# Patient Record
Sex: Male | Born: 1955 | Race: Black or African American | Hispanic: No | Marital: Married | Smoking: Former smoker
Health system: Southern US, Community
[De-identification: ages and names within clinical notes are randomized; demographics above are authoritative.]

## PROBLEM LIST (undated history)

## (undated) DIAGNOSIS — N186 End stage renal disease: Secondary | ICD-10-CM

## (undated) DIAGNOSIS — D649 Anemia, unspecified: Secondary | ICD-10-CM

## (undated) DIAGNOSIS — I6789 Other cerebrovascular disease: Secondary | ICD-10-CM

## (undated) DIAGNOSIS — I509 Heart failure, unspecified: Secondary | ICD-10-CM

## (undated) DIAGNOSIS — M1990 Unspecified osteoarthritis, unspecified site: Secondary | ICD-10-CM

## (undated) DIAGNOSIS — D631 Anemia in chronic kidney disease: Secondary | ICD-10-CM

## (undated) DIAGNOSIS — I5020 Unspecified systolic (congestive) heart failure: Secondary | ICD-10-CM

## (undated) DIAGNOSIS — E119 Type 2 diabetes mellitus without complications: Secondary | ICD-10-CM

## (undated) DIAGNOSIS — N189 Chronic kidney disease, unspecified: Secondary | ICD-10-CM

## (undated) DIAGNOSIS — I480 Paroxysmal atrial fibrillation: Secondary | ICD-10-CM

## (undated) DIAGNOSIS — Z992 Dependence on renal dialysis: Secondary | ICD-10-CM

## (undated) DIAGNOSIS — I639 Cerebral infarction, unspecified: Secondary | ICD-10-CM

## (undated) DIAGNOSIS — G4700 Insomnia, unspecified: Secondary | ICD-10-CM

## (undated) DIAGNOSIS — I10 Essential (primary) hypertension: Secondary | ICD-10-CM

## (undated) DIAGNOSIS — I5022 Chronic systolic (congestive) heart failure: Secondary | ICD-10-CM

## (undated) DIAGNOSIS — R112 Nausea with vomiting, unspecified: Secondary | ICD-10-CM

## (undated) DIAGNOSIS — Z9889 Other specified postprocedural states: Secondary | ICD-10-CM

## (undated) DIAGNOSIS — R0600 Dyspnea, unspecified: Secondary | ICD-10-CM

## (undated) DIAGNOSIS — R0609 Other forms of dyspnea: Secondary | ICD-10-CM

## (undated) DIAGNOSIS — I340 Nonrheumatic mitral (valve) insufficiency: Secondary | ICD-10-CM

## (undated) DIAGNOSIS — I5030 Unspecified diastolic (congestive) heart failure: Secondary | ICD-10-CM

## (undated) DIAGNOSIS — I2510 Atherosclerotic heart disease of native coronary artery without angina pectoris: Secondary | ICD-10-CM

## (undated) DIAGNOSIS — R011 Cardiac murmur, unspecified: Secondary | ICD-10-CM

## (undated) DIAGNOSIS — Z7982 Long term (current) use of aspirin: Secondary | ICD-10-CM

## (undated) DIAGNOSIS — E785 Hyperlipidemia, unspecified: Secondary | ICD-10-CM

## (undated) DIAGNOSIS — I4729 Other ventricular tachycardia: Secondary | ICD-10-CM

## (undated) DIAGNOSIS — F32A Depression, unspecified: Secondary | ICD-10-CM

## (undated) DIAGNOSIS — I272 Pulmonary hypertension, unspecified: Secondary | ICD-10-CM

## (undated) DIAGNOSIS — I517 Cardiomegaly: Secondary | ICD-10-CM

## (undated) DIAGNOSIS — I619 Nontraumatic intracerebral hemorrhage, unspecified: Secondary | ICD-10-CM

## (undated) HISTORY — DX: Nonrheumatic mitral (valve) insufficiency: I34.0

## (undated) HISTORY — DX: Unspecified systolic (congestive) heart failure: I50.20

## (undated) HISTORY — DX: Heart failure, unspecified: I50.9

## (undated) HISTORY — DX: Essential (primary) hypertension: I10

## (undated) HISTORY — PX: COLONOSCOPY: SHX174

## (undated) HISTORY — DX: Chronic kidney disease, unspecified: N18.9

## (undated) HISTORY — DX: Type 2 diabetes mellitus without complications: E11.9

## (undated) HISTORY — DX: Hyperlipidemia, unspecified: E78.5

## (undated) HISTORY — DX: Chronic systolic (congestive) heart failure: I50.22

---

## 2006-08-09 ENCOUNTER — Ambulatory Visit: Payer: Self-pay | Admitting: Cardiology

## 2014-06-01 IMAGING — CR CHEST 2 VWS PA LAT
1 series · 2 of 2 positions shown · non-contrast
Comparison: None

HISTORY/INDICATIONS: Cough, rule out pneumonia
TECHNIQUE: Chest 2 views.

[Series 1: view not recorded · 0.17mm/px · 2 of 2 slices shown]
[im 1/2]
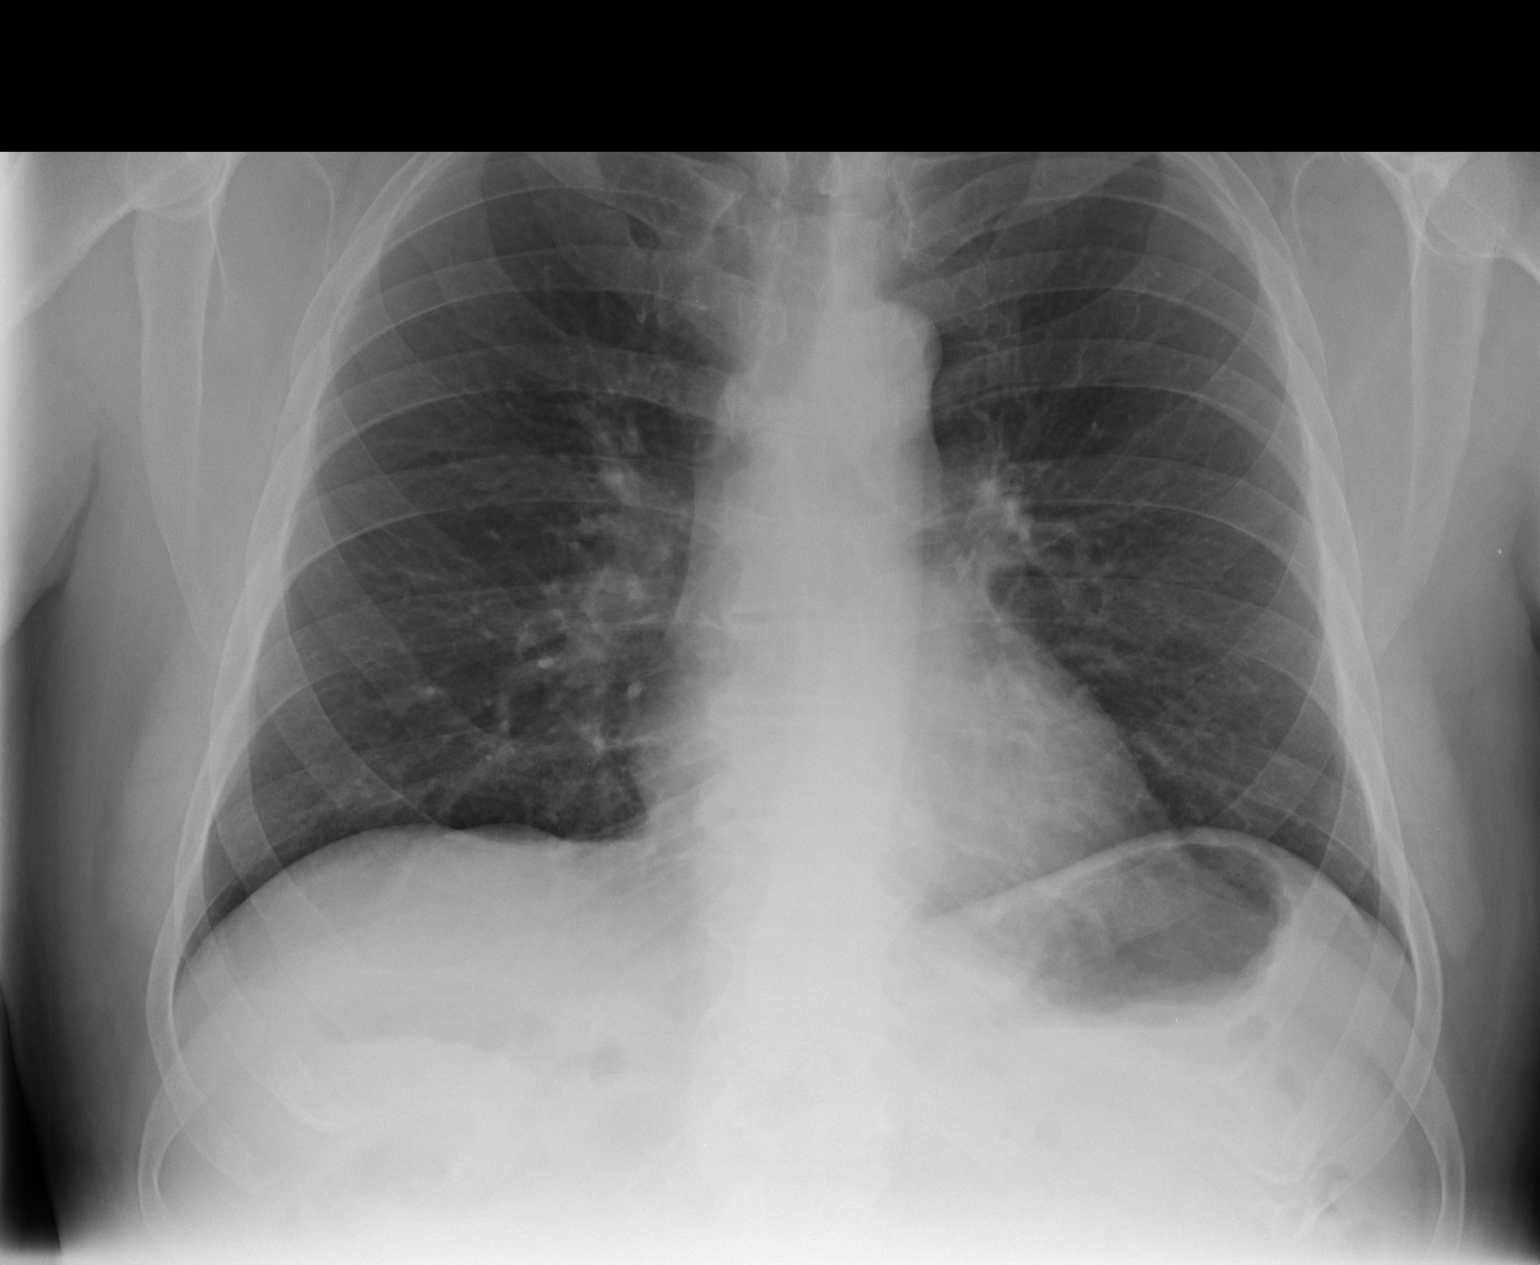
[im 2/2]
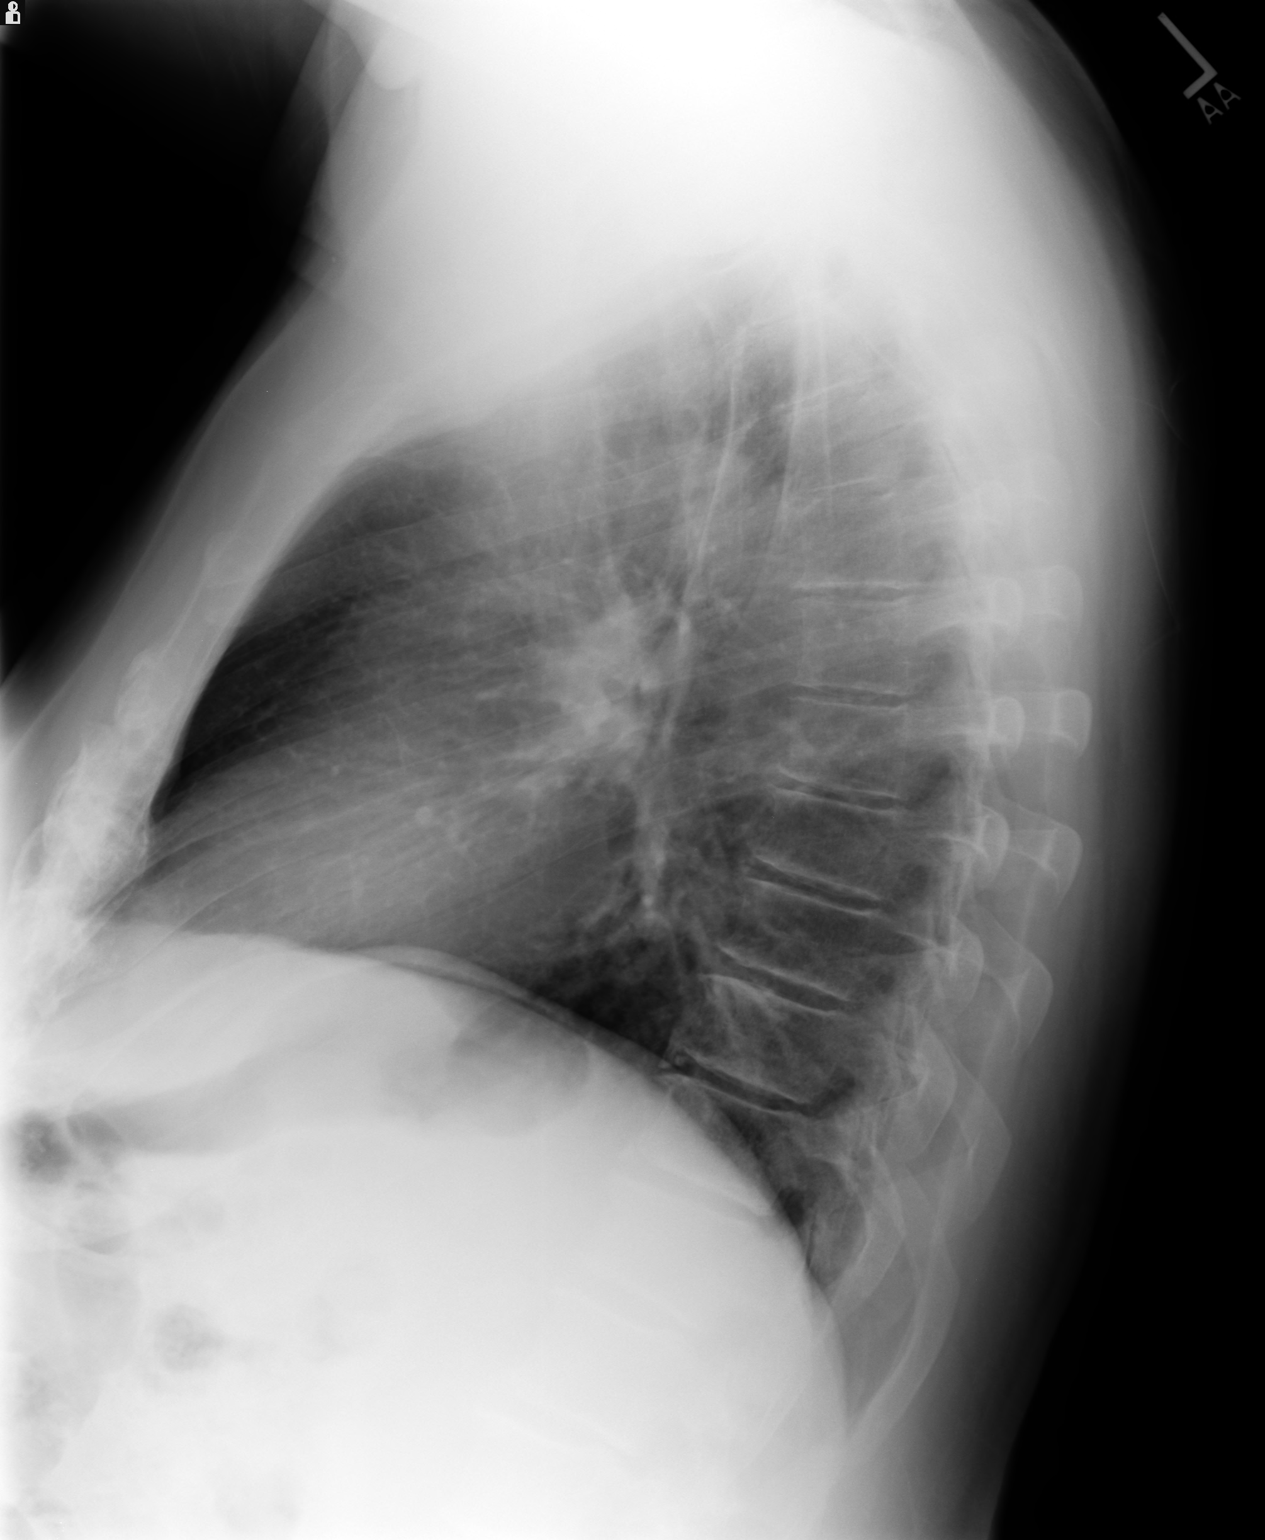

[2 of 2 positions shown; findings below may reference images not displayed]

FINDINGS: Cardiac size and pulmonary vascularity are normal.  The mediastinal silhouette is unremarkable.  The lungs are normal, and there is no pleural fluid.
IMPRESSION: Normal chest.

## 2016-03-10 IMAGING — CR WRIST LT 3 VWS MIN
2 series · 4 of 4 positions shown · non-contrast
Comparison: None.

HISTORY: 60 years-old Male with Encounter for general adult medical examination without abnormal findings.
TECHNIQUE: 4 views of left wrist.

[Series 1: oblique · 0.17mm/px · 3 of 3 slices shown]
[im 1/3]
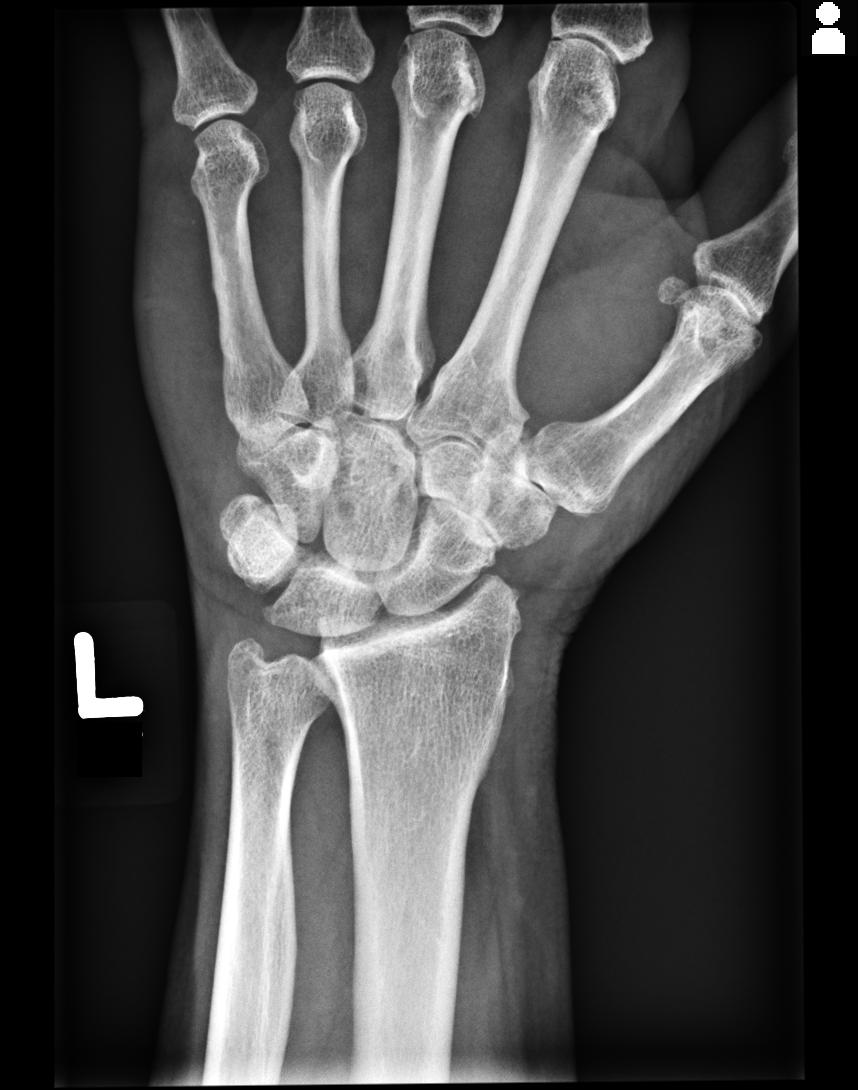
[im 2/3]
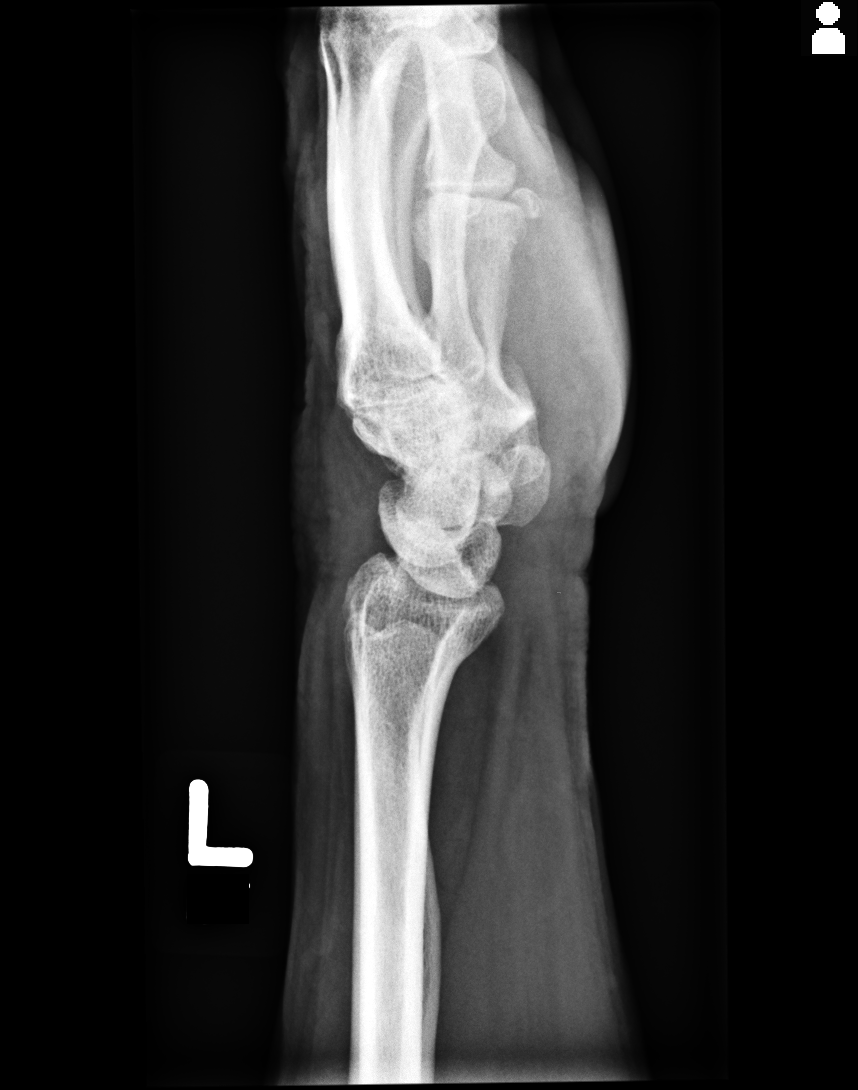
[im 3/3]
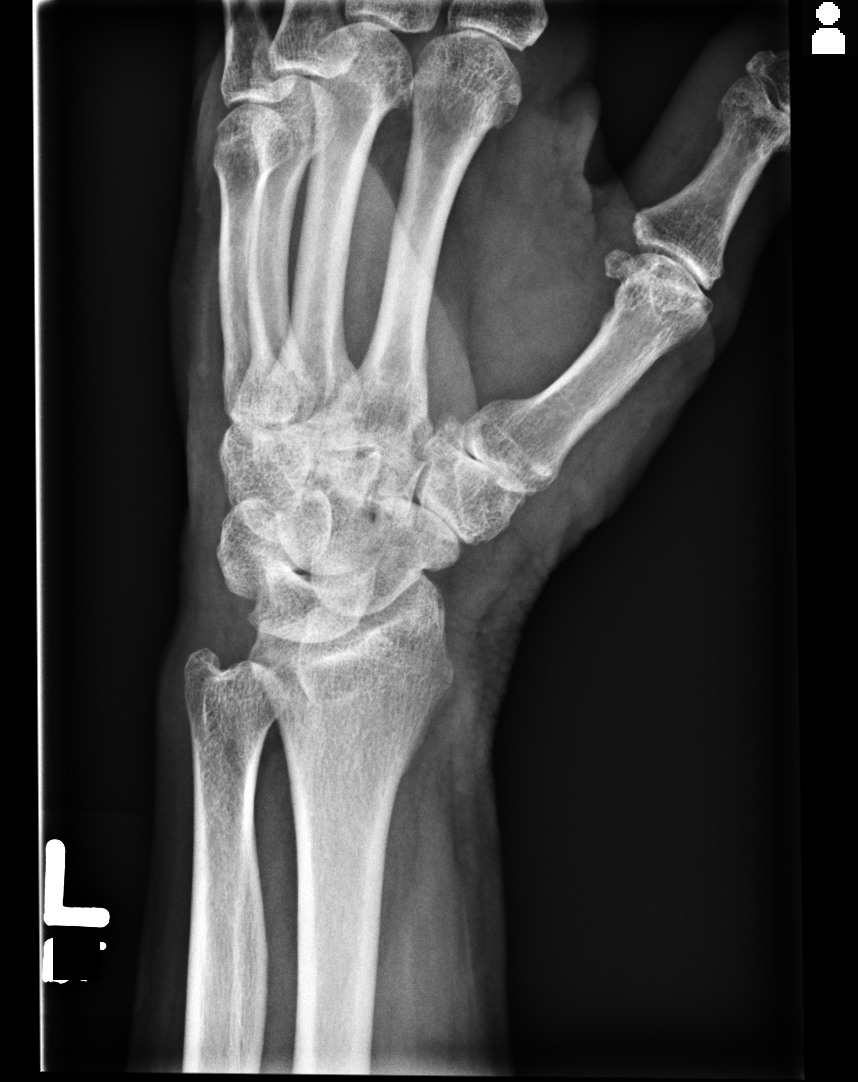

[ulnar flexion]
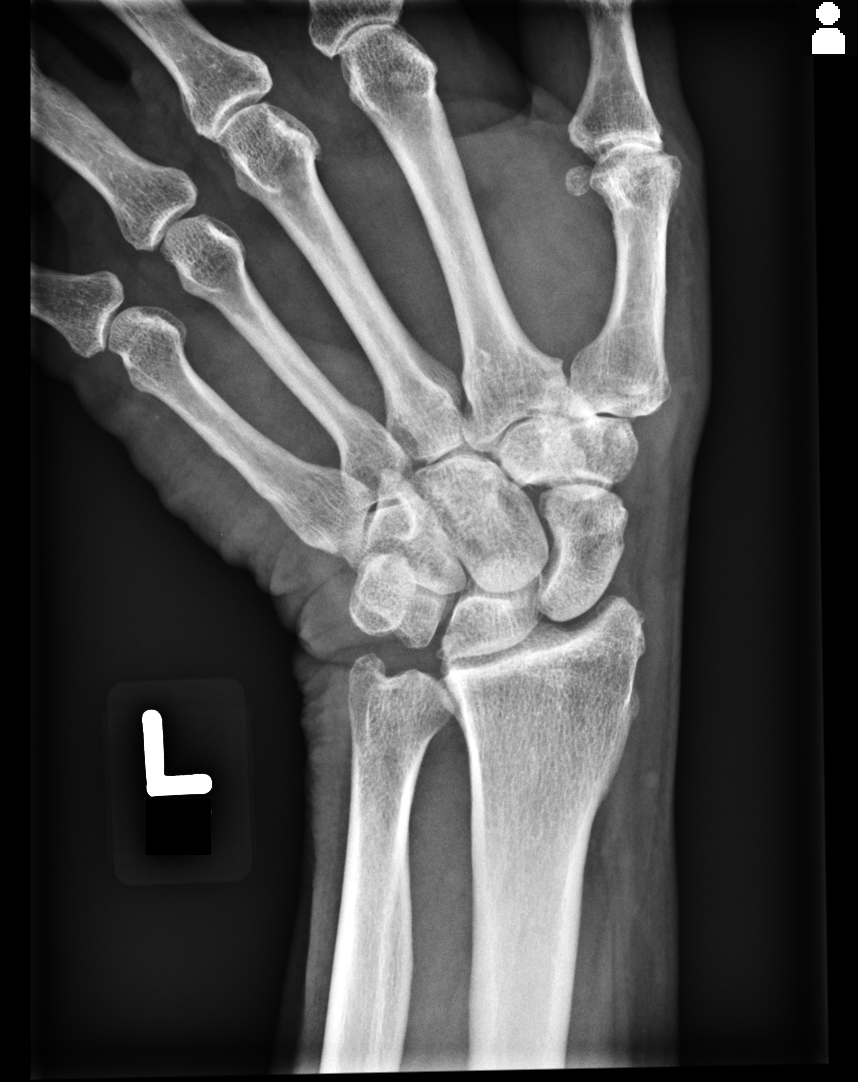

[4 of 4 positions shown; findings below may reference images not displayed]

FINDINGS: Bones: There is no evidence of fracture. Small cysts are present in the capitate bone.

Joints: The osseous alignment is unremarkable. No significant joint effusion is present.

Degenerative joint changes: There are no significant degenerative changes.

Soft tissues: The soft tissues are unremarkable. No radiopaque foreign bodies are present.
IMPRESSION: 1. No acute findings.

2. Small cysts identified in the capitate bone.

3. Consider MRI evaluation if patient has persistent pain.

## 2016-04-25 ENCOUNTER — Encounter: Payer: Self-pay | Admitting: *Deleted

## 2016-05-02 ENCOUNTER — Encounter: Payer: Self-pay | Admitting: General Surgery

## 2016-05-02 ENCOUNTER — Ambulatory Visit (INDEPENDENT_AMBULATORY_CARE_PROVIDER_SITE_OTHER): Payer: Managed Care, Other (non HMO) | Admitting: General Surgery

## 2016-05-02 VITALS — BP 134/74 | HR 76 | Resp 12 | Ht 70.0 in | Wt 232.0 lb

## 2016-05-02 DIAGNOSIS — Z1211 Encounter for screening for malignant neoplasm of colon: Secondary | ICD-10-CM

## 2016-05-02 MED ORDER — POLYETHYLENE GLYCOL 3350 17 GM/SCOOP PO POWD: 1.0000 | Freq: Once | ORAL | 0 refills | Status: AC

## 2016-05-02 NOTE — Progress Notes (Signed)
Patient ID: Victor Hernandez, male   DOB: [birth year], 60 y.o.   MRN: [id number]  Chief Complaint  Patient presents with  . Colonoscopy    HPI Victor Hernandez is a 60 y.o. male Here today for a evaluation of a screening colonoscopy. Patient states no GI problems at this time. Moves his bowels daily. Occasional use of laxatives. I have reviewed the history of present illness with the patient.  HPI  Past Medical History:  Diagnosis Date  . Diabetes mellitus without complication ([place])   . Hyperlipidemia   . Hypertension     Past Surgical History:  Procedure Laterality Date  . COLONOSCOPY      History reviewed. No pertinent family history.  Social History Social History  Substance Use Topics  . Smoking status: Never Smoker  . Smokeless tobacco: Never Used  . Alcohol use No    No Known Allergies  Current Outpatient Prescriptions  Medication Sig Dispense Refill  . Amlodipine-Valsartan-HCTZ 10-320-25 MG TABS Take 1 tablet by mouth daily. for high blood pressure  4  . furosemide (LASIX) 40 MG tablet Take 40 mg by mouth daily.    Marland Kitchen HUMALOG MIX 75/25 KWIKPEN (75-25) 100 UNIT/ML Kwikpen INJECT 45 UNITS SUBCUTANEOUSLY EVERY MORNING AS DIRECTED  1  . lovastatin (MEVACOR) 20 MG tablet TAKE 1 TABLET BY MOUTH EVERY DAY FOR CHOLESTEROL  12  . Potassium Chloride (KLOR-CON PO) Take 20 mg by mouth.    . polyethylene glycol powder (GLYCOLAX/MIRALAX) powder Take 255 Hernandez by mouth once. 255 Hernandez 0   No current facility-administered medications for this visit.     Review of Systems Review of Systems  Constitutional: Negative.   Respiratory: Negative.   Cardiovascular: Negative.   Gastrointestinal: Positive for constipation.    Blood pressure 134/74, pulse 76, resp. rate 12, height 5\' 10"  (1.778 m), weight 232 lb (105.2 kg).  Physical Exam Physical Exam  Constitutional: He is oriented to person, place, and time. He appears well-developed and well-nourished.  Eyes: Conjunctivae are normal. No  scleral icterus.  Neck: Neck supple.  Cardiovascular: Normal rate and regular rhythm.   Murmur heard.  Systolic murmur is present with a grade of 4/6  Pulmonary/Chest: Effort normal and breath sounds normal.  Abdominal: Soft. Bowel sounds are normal. There is no tenderness. A hernia (smalll umbilical hernia 1 cm ) is present.  Lymphadenopathy:    He has no cervical adenopathy.  Neurological: He is alert and oriented to person, place, and time.  Skin: Skin is warm and dry.    Data Reviewed Notes reviewed   Assessment    Stable exam. Heart murmur reportedly has been worked up before.     Plan    Colonoscopy with possible biopsy/polypectomy prn: Information regarding the procedure, including its potential risks and complications (including but not limited to perforation of the bowel, which may require emergency surgery to repair, and bleeding) was verbally given to the patient. Educational information regarding lower intestinal endoscopy was given to the patient. Written instructions for how to complete the bowel prep using Miralax were provided. The importance of drinking ample fluids to avoid dehydration as a result of the prep emphasized.  The patient is scheduled for a Colonoscopy at [facility] on 05/17/16. They are aware to call the day before to get their arrival time. He will take half his insulin dose the day of prep. He will hold his Insulin the day of procedure and only take his blood pressure medication at 6 am with a  sip of water. Miralax prescription has been sent into the patient's pharmacy. The patient is aware of date and instructions.    This information has been scribed by Gaspar Cola CMA.        Victor Hernandez 05/02/2016, 6:15 PM

## 2016-05-02 NOTE — Patient Instructions (Addendum)
Colonoscopy, Adult A colonoscopy is an exam to look at the entire large intestine. During the exam, a lubricated, bendable tube is inserted into the anus and then passed into the rectum, colon, and other parts of the large intestine. A colonoscopy is often done as a part of normal colorectal screening or in response to certain symptoms, such as anemia, persistent diarrhea, abdominal pain, and blood in the stool. The exam can help screen for and diagnose medical problems, including:  Tumors.  Polyps.  Inflammation.  Areas of bleeding. Tell a health care provider about:  Any allergies you have.  All medicines you are taking, including vitamins, herbs, eye drops, creams, and over-the-counter medicines.  Any problems you or family members have had with anesthetic medicines.  Any blood disorders you have.  Any surgeries you have had.  Any medical conditions you have.  Any problems you have had passing stool. What are the risks? Generally, this is a safe procedure. However, problems may occur, including:  Bleeding.  A tear in the intestine.  A reaction to medicines given during the exam.  Infection (rare). What happens before the procedure? Eating and drinking restrictions  Follow instructions from your health care provider about eating and drinking, which may include:  A few days before the procedure - follow a low-fiber diet. Avoid nuts, seeds, dried fruit, raw fruits, and vegetables.  1-3 days before the procedure - follow a clear liquid diet. Drink only clear liquids, such as clear broth or bouillon, black coffee or tea, clear juice, clear soft drinks or sports drinks, gelatin desert, and popsicles. Avoid any liquids that contain red or purple dye.  On the day of the procedure - do not eat or drink anything during the 2 hours before the procedure, or within the time period that your health care provider recommends. Bowel prep  If you were prescribed an oral bowel prep to  clean out your colon:  Take it as told by your health care provider. Starting the day before your procedure, you will need to drink a large amount of medicated liquid. The liquid will cause you to have multiple loose stools until your stool is almost clear or light green.  If your skin or anus gets irritated from diarrhea, you may use these to relieve the irritation:  Medicated wipes, such as adult wet wipes with aloe and vitamin E.  A skin soothing-product like petroleum jelly.  If you vomit while drinking the bowel prep, take a break for up to 60 minutes and then begin the bowel prep again. If vomiting continues and you cannot take the bowel prep without vomiting, call your health care provider. General instructions  Ask your health care provider about changing or stopping your regular medicines. This is especially important if you are taking diabetes medicines or blood thinners.  Plan to have someone take you home from the hospital or clinic. What happens during the procedure?  An IV tube may be inserted into one of your veins.  You will be given medicine to help you relax (sedative).  To reduce your risk of infection:  Your health care team will wash or sanitize their hands.  Your anal area will be washed with soap.  You will be asked to lie on your side with your knees bent.  Your health care provider will lubricate a long, thin, flexible tube. The tube will have a camera and a light on the end.  The tube will be inserted into your anus.  The tube will be gently eased through your rectum and colon.  Air will be delivered into your colon to keep it open. You may feel some pressure or cramping.  The camera will be used to take images during the procedure.  A small tissue sample may be removed from your body to be examined under a microscope (biopsy). If any potential problems are found, the tissue will be sent to a lab for testing.  If small polyps are found, your health  care provider may remove them and have them checked for cancer cells.  The tube that was inserted into your anus will be slowly removed. The procedure may vary among health care providers and hospitals. What happens after the procedure?  Your blood pressure, heart rate, breathing rate, and blood oxygen level will be monitored until the medicines you were given have worn off.  Do not drive for 24 hours after the exam.  You may have a small amount of blood in your stool.  You may pass gas and have mild abdominal cramping or bloating due to the air that was used to inflate your colon during the exam.  It is up to you to get the results of your procedure. Ask your health care provider, or the department performing the procedure, when your results will be ready. This information is not intended to replace advice given to you by your health care provider. Make sure you discuss any questions you have with your health care provider. Document Released: 05/12/2000 Document Revised: 12/03/2015 Document Reviewed: 07/27/2015 Elsevier Interactive Patient Education  2017 [employer].  The patient is scheduled for a Colonoscopy at [facility] on 05/17/16. They are aware to call the day before to get their arrival time. He will take half his insulin dose the day of prep. He will hold his Insulin the day of procedure and only take his blood pressure medication at 6 am with a sip of water. Miralax prescription has been sent into the patient's pharmacy. The patient is aware of date and instructions.

## 2016-05-17 ENCOUNTER — Ambulatory Visit
Admission: RE | Admit: 2016-05-17 | Discharge: 2016-05-17 | Disposition: A | Discharge status: Home / Self Care | Payer: Managed Care, Other (non HMO) | Source: Ambulatory Visit | Attending: General Surgery | Admitting: General Surgery

## 2016-05-17 ENCOUNTER — Other Ambulatory Visit: Payer: Self-pay | Admitting: *Deleted

## 2016-05-17 ENCOUNTER — Encounter
Admission: RE | Disposition: A | Discharge status: Home / Self Care | Payer: Self-pay | Source: Ambulatory Visit | Attending: General Surgery

## 2016-05-17 ENCOUNTER — Encounter: Payer: Self-pay | Admitting: *Deleted

## 2016-05-17 DIAGNOSIS — Z1211 Encounter for screening for malignant neoplasm of colon: Secondary | ICD-10-CM

## 2016-05-17 DIAGNOSIS — Z538 Procedure and treatment not carried out for other reasons: Secondary | ICD-10-CM | POA: Diagnosis not present

## 2016-05-17 SURGERY — COLONOSCOPY WITH PROPOFOL
Anesthesia: General

## 2016-05-17 MED ORDER — POLYETHYLENE GLYCOL 3350 17 GM/SCOOP PO POWD: ORAL | 0 refills | Status: DC

## 2016-05-17 MED ORDER — SODIUM CHLORIDE 0.9 % IV SOLN
1.0000 g | INTRAVENOUS | Status: DC
  Filled 2016-05-17: qty 1

## 2016-05-17 NOTE — Progress Notes (Signed)
Patient's colonoscopy has been moved from 05-17-16 to 05-19-16 at [facility]. This patient was unable to have colonoscopy completed today as originally scheduled since he ate a hamburger for lunch yesterday.  Patient was contacted again today and instructions were reviewed including the importance of being on clear liquids the day prior to procedure. Also, reminded to take 1/2 of usual insulin dose tomorrow and only take his blood pressure medication at 6 am the morning of colonoscopy.   He verbalizes understanding.

## 2016-05-17 NOTE — H&P (View-Only) (Signed)
Patient ID: Victor Hernandez, male   DOB: Dec 16, 1955, 60 y.o.   MRN: [id number]  Chief Complaint  Patient presents with  . Colonoscopy    HPI Victor Hernandez is a 60 y.o. male Here today for a evaluation of a screening colonoscopy. Patient states no GI problems at this time. Moves his bowels daily. Occasional use of laxatives. I have reviewed the history of present illness with the patient.  HPI  Past Medical History:  Diagnosis Date  . Diabetes mellitus without complication ([place])   . Hyperlipidemia   . Hypertension     Past Surgical History:  Procedure Laterality Date  . COLONOSCOPY      History reviewed. No pertinent family history.  Social History Social History  Substance Use Topics  . Smoking status: Never Smoker  . Smokeless tobacco: Never Used  . Alcohol use No    No Known Allergies  Current Outpatient Prescriptions  Medication Sig Dispense Refill  . Amlodipine-Valsartan-HCTZ 10-320-25 MG TABS Take 1 tablet by mouth daily. for high blood pressure  4  . furosemide (LASIX) 40 MG tablet Take 40 mg by mouth daily.    Marland Kitchen HUMALOG MIX 75/25 KWIKPEN (75-25) 100 UNIT/ML Kwikpen INJECT 45 UNITS SUBCUTANEOUSLY EVERY MORNING AS DIRECTED  1  . lovastatin (MEVACOR) 20 MG tablet TAKE 1 TABLET BY MOUTH EVERY DAY FOR CHOLESTEROL  12  . Potassium Chloride (KLOR-CON PO) Take 20 mg by mouth.    . polyethylene glycol powder (GLYCOLAX/MIRALAX) powder Take 255 g by mouth once. 255 g 0   No current facility-administered medications for this visit.     Review of Systems Review of Systems  Constitutional: Negative.   Respiratory: Negative.   Cardiovascular: Negative.   Gastrointestinal: Positive for constipation.    Blood pressure 134/74, pulse 76, resp. rate 12, height 5\' 10"  (1.778 m), weight 232 lb (105.2 kg).  Physical Exam Physical Exam  Constitutional: He is oriented to person, place, and time. He appears well-developed and well-nourished.  Eyes: Conjunctivae are normal. No  scleral icterus.  Neck: Neck supple.  Cardiovascular: Normal rate and regular rhythm.   Murmur heard.  Systolic murmur is present with a grade of 4/6  Pulmonary/Chest: Effort normal and breath sounds normal.  Abdominal: Soft. Bowel sounds are normal. There is no tenderness. A hernia (smalll umbilical hernia 1 cm ) is present.  Lymphadenopathy:    He has no cervical adenopathy.  Neurological: He is alert and oriented to person, place, and time.  Skin: Skin is warm and dry.    Data Reviewed Notes reviewed   Assessment    Stable exam. Heart murmur reportedly has been worked up before.     Plan    Colonoscopy with possible biopsy/polypectomy prn: Information regarding the procedure, including its potential risks and complications (including but not limited to perforation of the bowel, which may require emergency surgery to repair, and bleeding) was verbally given to the patient. Educational information regarding lower intestinal endoscopy was given to the patient. Written instructions for how to complete the bowel prep using Miralax were provided. The importance of drinking ample fluids to avoid dehydration as a result of the prep emphasized.  The patient is scheduled for a Colonoscopy at [facility] on 05/17/16. They are aware to call the day before to get their arrival time. He will take half his insulin dose the day of prep. He will hold his Insulin the day of procedure and only take his blood pressure medication at 6 am with a  sip of water. Miralax prescription has been sent into the patient's pharmacy. The patient is aware of date and instructions.    This information has been scribed by Gaspar Cola CMA.        SANKAR,SEEPLAPUTHUR G 05/02/2016, 6:15 PM

## 2016-05-17 NOTE — Interval H&P Note (Signed)
History and Physical Interval Note:  05/17/2016 8:10 AM  Victor Hernandez  has presented today for surgery, with the diagnosis of SCREENING  The various methods of treatment have been discussed with the patient and family. After consideration of risks, benefits and other options for treatment, the patient has consented to  Procedure(s): COLONOSCOPY WITH PROPOFOL (N/A) as a surgical intervention .  The patient's history has been reviewed, patient examined, no change in status, stable for surgery.  I have reviewed the patient's chart and labs.  Questions were answered to the patient's satisfaction.     SANKAR,SEEPLAPUTHUR G

## 2016-05-18 ENCOUNTER — Encounter: Payer: Self-pay | Admitting: *Deleted

## 2016-05-19 ENCOUNTER — Ambulatory Visit: Payer: Managed Care, Other (non HMO) | Admitting: Anesthesiology

## 2016-05-19 ENCOUNTER — Ambulatory Visit
Admission: RE | Admit: 2016-05-19 | Discharge: 2016-05-19 | Disposition: A | Discharge status: Home / Self Care | Payer: Managed Care, Other (non HMO) | Source: Ambulatory Visit | Attending: General Surgery | Admitting: General Surgery

## 2016-05-19 ENCOUNTER — Encounter: Payer: Self-pay | Admitting: *Deleted

## 2016-05-19 ENCOUNTER — Encounter
Admission: RE | Disposition: A | Discharge status: Home / Self Care | Payer: Self-pay | Source: Ambulatory Visit | Attending: General Surgery

## 2016-05-19 DIAGNOSIS — D123 Benign neoplasm of transverse colon: Secondary | ICD-10-CM | POA: Insufficient documentation

## 2016-05-19 DIAGNOSIS — Z794 Long term (current) use of insulin: Secondary | ICD-10-CM | POA: Insufficient documentation

## 2016-05-19 DIAGNOSIS — Z1211 Encounter for screening for malignant neoplasm of colon: Secondary | ICD-10-CM

## 2016-05-19 DIAGNOSIS — E785 Hyperlipidemia, unspecified: Secondary | ICD-10-CM | POA: Diagnosis not present

## 2016-05-19 DIAGNOSIS — Z9889 Other specified postprocedural states: Secondary | ICD-10-CM | POA: Insufficient documentation

## 2016-05-19 DIAGNOSIS — Z79899 Other long term (current) drug therapy: Secondary | ICD-10-CM | POA: Insufficient documentation

## 2016-05-19 DIAGNOSIS — I10 Essential (primary) hypertension: Secondary | ICD-10-CM | POA: Insufficient documentation

## 2016-05-19 DIAGNOSIS — E119 Type 2 diabetes mellitus without complications: Secondary | ICD-10-CM | POA: Insufficient documentation

## 2016-05-19 HISTORY — PX: COLONOSCOPY WITH PROPOFOL: SHX5780

## 2016-05-19 LAB — GLUCOSE, CAPILLARY: GLUCOSE-CAPILLARY: 126 mg/dL — AB (ref 65–99)

## 2016-05-19 SURGERY — COLONOSCOPY WITH PROPOFOL
Anesthesia: General

## 2016-05-19 MED ORDER — PROPOFOL 500 MG/50ML IV EMUL
INTRAVENOUS | Status: DC | PRN
  Administered 2016-05-19: 120 ug/kg/min via INTRAVENOUS

## 2016-05-19 MED ORDER — SODIUM CHLORIDE 0.9 % IV SOLN
Freq: Once | INTRAVENOUS | Status: AC
  Administered 2016-05-19: 11:00:00 via INTRAVENOUS

## 2016-05-19 MED ORDER — SODIUM CHLORIDE 0.9 % IV SOLN
INTRAVENOUS | Status: DC | PRN
  Administered 2016-05-19: 12:00:00 via INTRAVENOUS

## 2016-05-19 MED ORDER — PROPOFOL 10 MG/ML IV BOLUS
INTRAVENOUS | Status: AC
  Filled 2016-05-19: qty 20

## 2016-05-19 MED ORDER — PROPOFOL 10 MG/ML IV BOLUS
INTRAVENOUS | Status: DC | PRN
  Administered 2016-05-19: 90 mg via INTRAVENOUS

## 2016-05-19 MED ORDER — PROPOFOL 500 MG/50ML IV EMUL
INTRAVENOUS | Status: AC
  Filled 2016-05-19: qty 50

## 2016-05-19 NOTE — Transfer of Care (Signed)
Immediate Anesthesia Transfer of Care Note  Patient: Victor Hernandez  Procedure(s) Performed: Procedure(s): COLONOSCOPY WITH PROPOFOL (N/A)  Patient Location: PACU and Endoscopy Unit  Anesthesia Type:General  Level of Consciousness: patient cooperative and lethargic  Airway & Oxygen Therapy: Patient Spontanous Breathing and Patient connected to face mask oxygen  Post-op Assessment: Report given to RN and Post -op Vital signs reviewed and stable  Post vital signs: Reviewed and stable  Last Vitals:  Vitals:   05/19/16 1016 05/19/16 1247  BP: (!) 183/88 (!) 96/58  Pulse: 76 77  Resp: 14 18  Temp: 36.5 C     Last Pain:  Vitals:   05/19/16 1016  TempSrc: Tympanic         Complications: No apparent anesthesia complications

## 2016-05-19 NOTE — Anesthesia Preprocedure Evaluation (Addendum)
Anesthesia Evaluation  Patient identified by MRN, date of birth, ID band Patient awake    Reviewed: Allergy & Precautions, H&P , NPO status , Patient's Chart, lab work & pertinent test results  Airway Mallampati: III  TM Distance: >3 FB Neck ROM: full    Dental  (+) Poor Dentition, Chipped, Missing   Pulmonary neg pulmonary ROS, neg shortness of breath,    Pulmonary exam normal breath sounds clear to auscultation       Cardiovascular Exercise Tolerance: Good hypertension, (-) angina+ Past MI  (-) Cardiac Stents and (-) DOE Normal cardiovascular exam Rhythm:regular Rate:Normal     Neuro/Psych negative neurological ROS  negative psych ROS   GI/Hepatic Neg liver ROS, GERD  Controlled,  Endo/Other  diabetes, Type 2, Insulin Dependent  Renal/GU negative Renal ROS  negative genitourinary   Musculoskeletal   Abdominal   Peds  Hematology negative hematology ROS (+)   Anesthesia Other Findings Signs and symptoms suggestive of sleep apnea   Past Medical History: No date: Diabetes mellitus without complication ([facility]) No date: Hyperlipidemia No date: Hypertension  Past Surgical History: No date: COLONOSCOPY  BMI    Body Mass Index:  33.29 kg/m      Reproductive/Obstetrics negative OB ROS                            Anesthesia Physical Anesthesia Plan  ASA: III  Anesthesia Plan: General   Post-op Pain Management:    Induction:   Airway Management Planned:   Additional Equipment:   Intra-op Plan:   Post-operative Plan:   Informed Consent: I have reviewed the patients History and Physical, chart, labs and discussed the procedure including the risks, benefits and alternatives for the proposed anesthesia with the patient or authorized representative who has indicated his/her understanding and acceptance.   Dental Advisory Given  Plan Discussed with: Anesthesiologist, CRNA and  Surgeon  Anesthesia Plan Comments:         Anesthesia Quick Evaluation

## 2016-05-19 NOTE — Op Note (Signed)
[facility] Patient Name: Victor Hernandez Procedure Date: 05/19/2016 11:27 AM MRN: [id number] Account #: [id number] Date of Birth: 11/05/1955 Admit Type: Outpatient Age: 60 Room: [facility] ENDO ROOM 4 Gender: Male Note Status: Finalized Procedure:            Colonoscopy Indications:          Screening for colorectal malignant neoplasm Providers:            Seeplaputhur G. Jamal Collin, MD Medicines:            General Anesthesia Complications:        No immediate complications. Procedure:            Pre-Anesthesia Assessment:                       - General anesthesia under the supervision of an                        anesthesiologist was determined to be medically                        necessary for this procedure based on review of the                        patient's medical history, medications, and prior                        anesthesia history.                       After obtaining informed consent, the colonoscope was                        passed under direct vision. Throughout the procedure,                        the patient's blood pressure, pulse, and oxygen                        saturations were monitored continuously. The                        Colonoscope was introduced through the anus and                        advanced to the the cecum, identified by the ileocecal                        valve. The colonoscopy was performed with moderate                        difficulty due to poor bowel prep. The patient                        tolerated the procedure well. Findings:      The perianal and digital rectal examinations were normal.      A 5 mm polyp was found in the splenic flexure. The polyp was sessile.       The polyp was removed with a hot snare. Resection and retrieval were       complete.      The exam was otherwise  without abnormality. Impression:           - One 5 mm polyp at the splenic flexure, removed with a   hot snare. Resected and retrieved.                       - The examination was otherwise normal. Recommendation:       - Discharge patient to home.                       - Resume previous diet.                       - Continue present medications.                       - Repeat colonoscopy in 3 years for surveillance. Procedure Code(s):    --- Professional ---                       [phone], Colonoscopy, flexible; with removal of tumor(s),                        polyp(s), or other lesion(s) by snare technique Diagnosis Code(s):    --- Professional ---                       Z12.11, Encounter for screening for malignant neoplasm                        of colon                       D12.3, Benign neoplasm of transverse colon (hepatic                        flexure or splenic flexure) CPT copyright 2016 American Medical Association. All rights reserved. The codes documented in this report are preliminary and upon coder review may  be revised to meet current compliance requirements. Christene Lye, MD 05/19/2016 12:44:20 PM This report has been signed electronically. Number of Addenda: 0 Note Initiated On: 05/19/2016 11:27 AM Scope Withdrawal Time: 0 hours 10 minutes 48 seconds  Total Procedure Duration: 0 hours 46 minutes 15 seconds       [facility]

## 2016-05-19 NOTE — Interval H&P Note (Signed)
History and Physical Interval Note:  05/19/2016 11:15 AM  Victor Hernandez  has presented today for surgery, with the diagnosis of SCREENING  The various methods of treatment have been discussed with the patient and family. After consideration of risks, benefits and other options for treatment, the patient has consented to  Procedure(s): COLONOSCOPY WITH PROPOFOL (N/A) as a surgical intervention .  The patient's history has been reviewed, patient examined, no change in status, stable for surgery.  I have reviewed the patient's chart and labs.  Questions were answered to the patient's satisfaction.     Liz Pinho G

## 2016-05-19 NOTE — H&P (View-Only) (Signed)
Patient ID: Victor Hernandez, male   DOB: 08-06-1955, 60 y.o.   MRN: [id number]  Chief Complaint  Patient presents with  . Colonoscopy    HPI Victor Hernandez is a 60 y.o. male Here today for a evaluation of a screening colonoscopy. Patient states no GI problems at this time. Moves his bowels daily. Occasional use of laxatives. I have reviewed the history of present illness with the patient.  HPI  Past Medical History:  Diagnosis Date  . Diabetes mellitus without complication ([place])   . Hyperlipidemia   . Hypertension     Past Surgical History:  Procedure Laterality Date  . COLONOSCOPY      History reviewed. No pertinent family history.  Social History Social History  Substance Use Topics  . Smoking status: Never Smoker  . Smokeless tobacco: Never Used  . Alcohol use No    No Known Allergies  Current Outpatient Prescriptions  Medication Sig Dispense Refill  . Amlodipine-Valsartan-HCTZ 10-320-25 MG TABS Take 1 tablet by mouth daily. for high blood pressure  4  . furosemide (LASIX) 40 MG tablet Take 40 mg by mouth daily.    Marland Kitchen HUMALOG MIX 75/25 KWIKPEN (75-25) 100 UNIT/ML Kwikpen INJECT 45 UNITS SUBCUTANEOUSLY EVERY MORNING AS DIRECTED  1  . lovastatin (MEVACOR) 20 MG tablet TAKE 1 TABLET BY MOUTH EVERY DAY FOR CHOLESTEROL  12  . Potassium Chloride (KLOR-CON PO) Take 20 mg by mouth.    . polyethylene glycol powder (GLYCOLAX/MIRALAX) powder Take 255 g by mouth once. 255 g 0   No current facility-administered medications for this visit.     Review of Systems Review of Systems  Constitutional: Negative.   Respiratory: Negative.   Cardiovascular: Negative.   Gastrointestinal: Positive for constipation.    Blood pressure 134/74, pulse 76, resp. rate 12, height 5\' 10"  (1.778 m), weight 232 lb (105.2 kg).  Physical Exam Physical Exam  Constitutional: He is oriented to person, place, and time. He appears well-developed and well-nourished.  Eyes: Conjunctivae are normal. No  scleral icterus.  Neck: Neck supple.  Cardiovascular: Normal rate and regular rhythm.   Murmur heard.  Systolic murmur is present with a grade of 4/6  Pulmonary/Chest: Effort normal and breath sounds normal.  Abdominal: Soft. Bowel sounds are normal. There is no tenderness. A hernia (smalll umbilical hernia 1 cm ) is present.  Lymphadenopathy:    He has no cervical adenopathy.  Neurological: He is alert and oriented to person, place, and time.  Skin: Skin is warm and dry.    Data Reviewed Notes reviewed   Assessment    Stable exam. Heart murmur reportedly has been worked up before.     Plan    Colonoscopy with possible biopsy/polypectomy prn: Information regarding the procedure, including its potential risks and complications (including but not limited to perforation of the bowel, which may require emergency surgery to repair, and bleeding) was verbally given to the patient. Educational information regarding lower intestinal endoscopy was given to the patient. Written instructions for how to complete the bowel prep using Miralax were provided. The importance of drinking ample fluids to avoid dehydration as a result of the prep emphasized.  The patient is scheduled for a Colonoscopy at [facility] on 05/17/16. They are aware to call the day before to get their arrival time. He will take half his insulin dose the day of prep. He will hold his Insulin the day of procedure and only take his blood pressure medication at 6 am with a  sip of water. Miralax prescription has been sent into the patient's pharmacy. The patient is aware of date and instructions.    This information has been scribed by Gaspar Cola CMA.        Victor Hernandez G 05/02/2016, 6:15 PM

## 2016-05-23 NOTE — Anesthesia Postprocedure Evaluation (Signed)
Anesthesia Post Note  Patient: Victor Hernandez  Procedure(s) Performed: Procedure(s) (LRB): COLONOSCOPY WITH PROPOFOL (N/A)  Patient location during evaluation: PACU Anesthesia Type: General Level of consciousness: awake and alert and oriented Pain management: pain level controlled Vital Signs Assessment: post-procedure vital signs reviewed and stable Respiratory status: spontaneous breathing Cardiovascular status: blood pressure returned to baseline Anesthetic complications: no     Last Vitals:  Vitals:   05/19/16 1305 05/19/16 1315  BP: (!) 145/88 (!) 144/84  Pulse: 83 76  Resp: (!) 26 (!) 21  Temp:      Last Pain:  Vitals:   05/19/16 1245  TempSrc: Tympanic  PainSc: Asleep                 Tunis Gentle

## 2016-05-24 ENCOUNTER — Encounter: Payer: Self-pay | Admitting: General Surgery

## 2017-01-06 IMAGING — US US ABDOMEN COMPLETE
1 series · 13 of 25 positions shown · non-contrast
Comparison: None

Ultrasound abdomen complete
INDICATION: Unilateral inguinal hernia. Abdominal pain.
TECHNIQUE: Multiple sonographic images of the abdomen, complete

[Series 1: us abdomen complete · 13 of 59 slices shown]
[im 1/59]
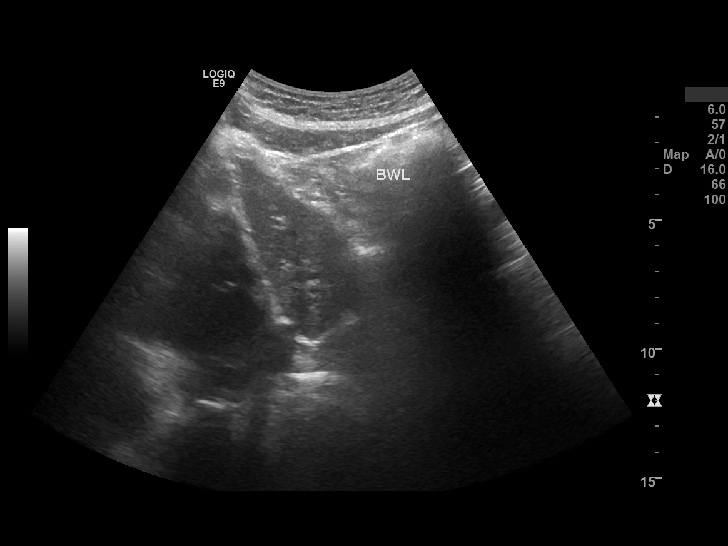
[im 5/59]
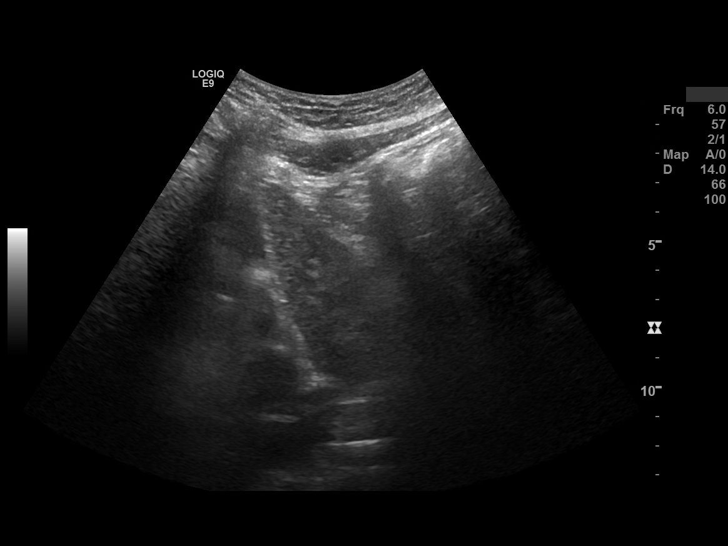
[im 10/59]
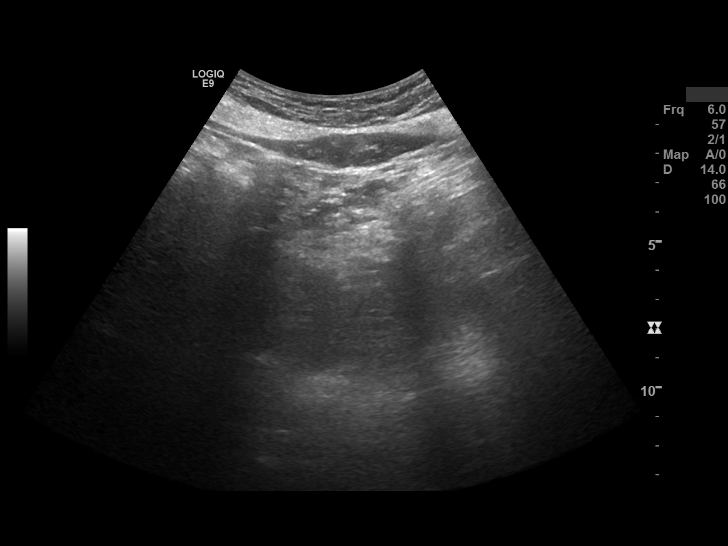
[im 15/59]
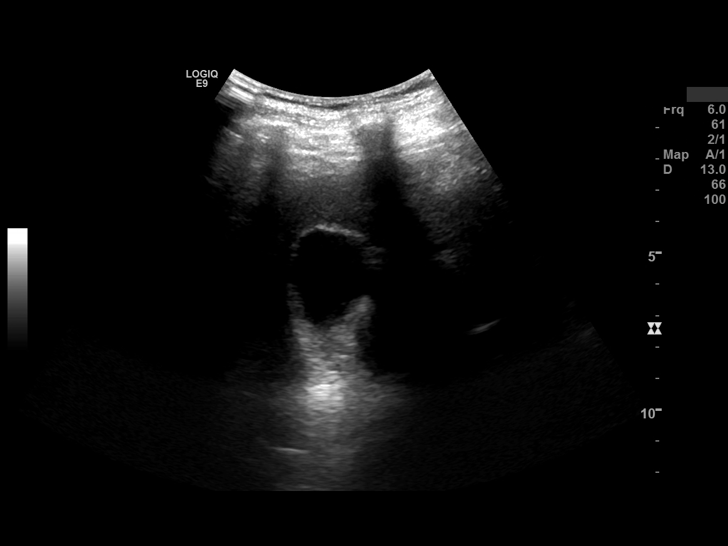
[im 20/59]
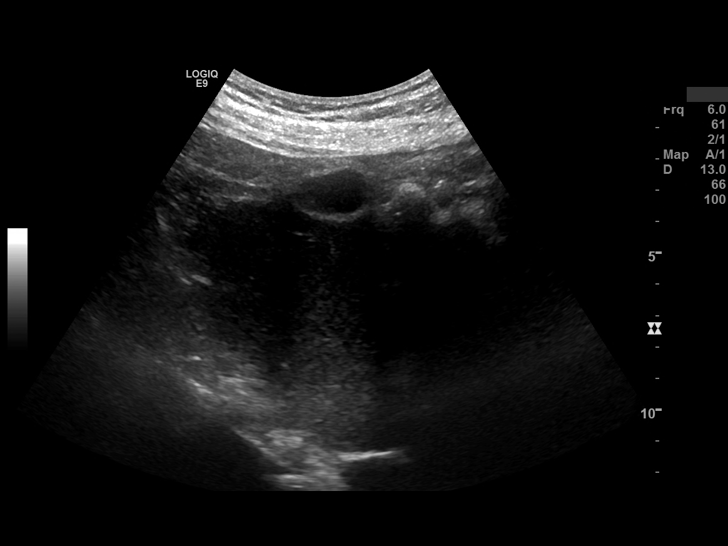
[im 25/59]
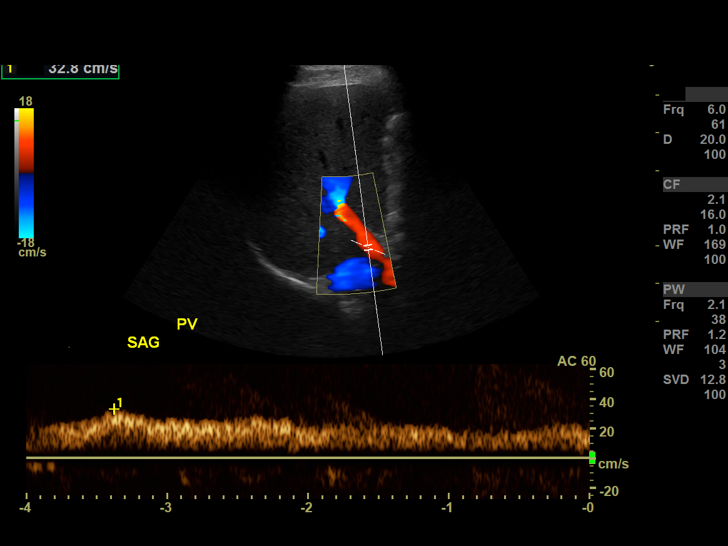
[im 30/59]
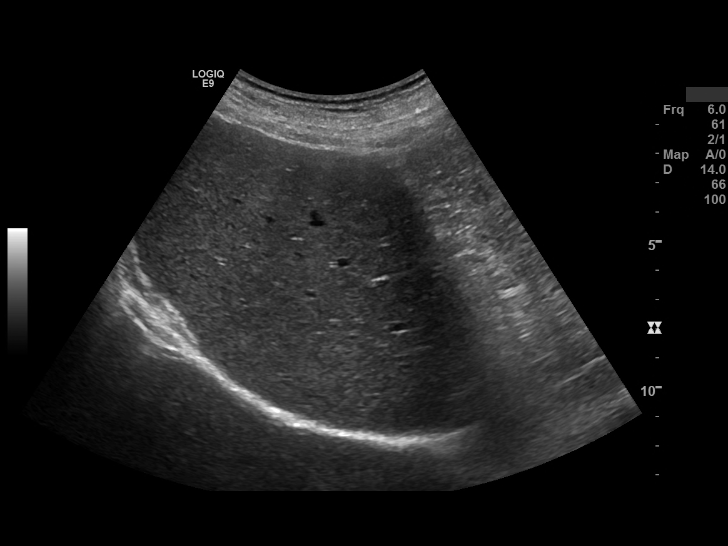
[im 34/59]
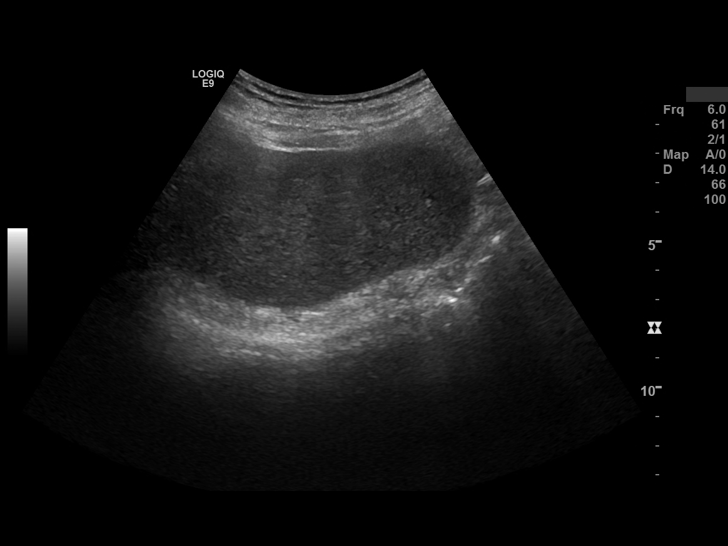
[im 39/59]
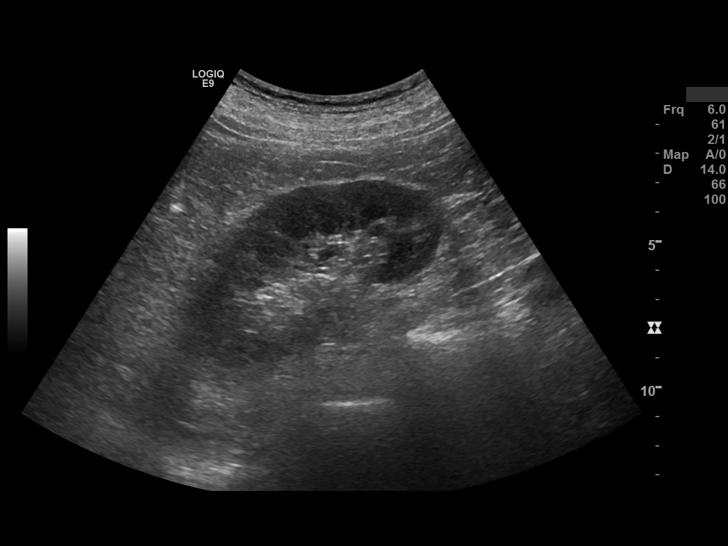
[im 44/59]
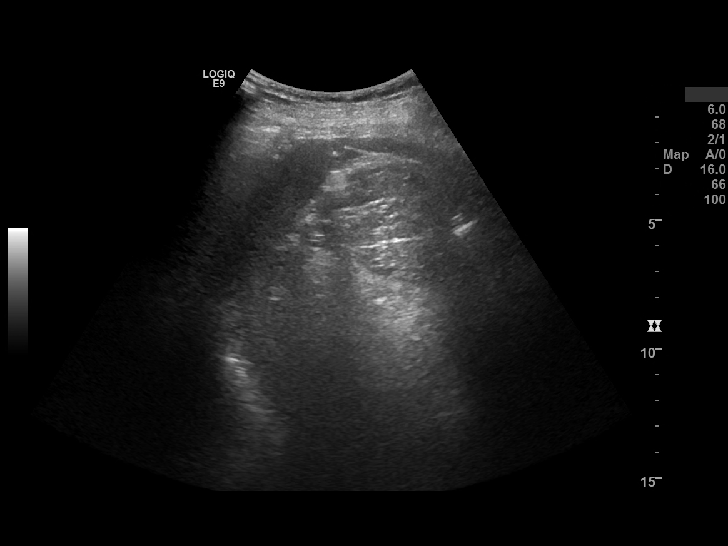
[im 49/59]
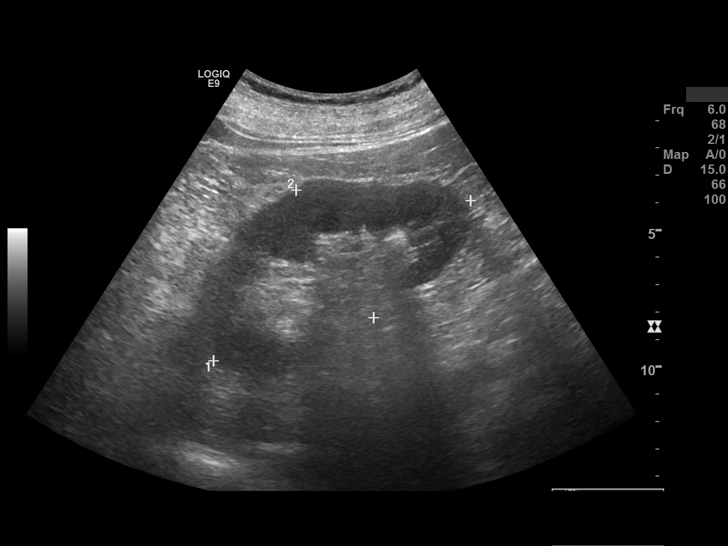
[im 54/59]
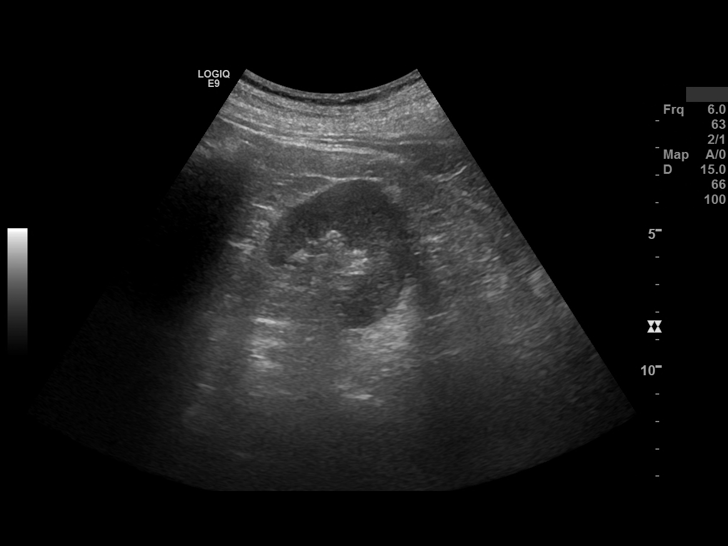
[im 59/59]
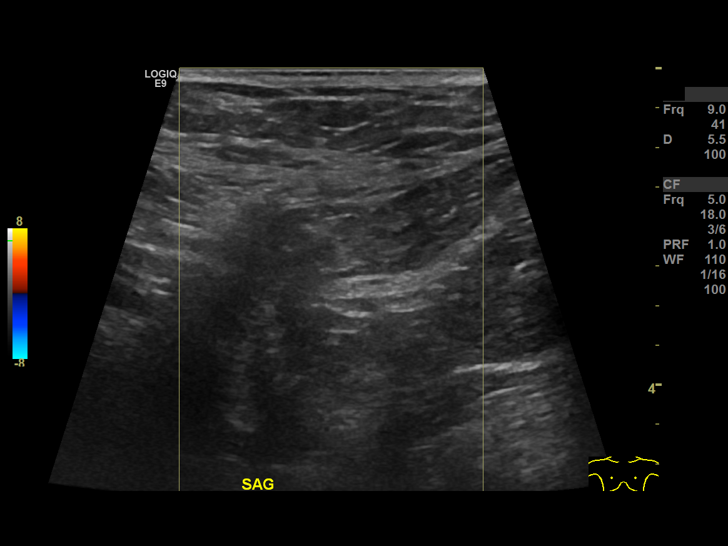

[13 of 25 positions shown; findings below may reference images not displayed]

FINDINGS: Proximal aorta are not well visualized. Distal aorta appears within normal limits. IVC is unremarkable. Pancreas is nondiagnostic due to overlying bowel gas. Liver demonstrates normal echotexture without evidence of mass or intrahepatic biliary ductal dilatation. Normal hepatopetal flow within the portal vein. Gallbladder is unremarkable. Common bile duct measures 0.3 cm, within normal limits. Spleen is unremarkable measuring 10.8 x 3.1 cm. Right kidney measures 10.0 x 4.8 cm. Left kidney measures 11.1 x 5.5 cm. There is a single 1 cm stone nonobstructing in the left kidney. No hydronephrosis. Limited evaluation of the urinary bladder due to overlying bowel gas. Probable fat-containing small ventral hernia in the left lower quadrant of the abdomen.
IMPRESSION: 1. Single nonobstructing 1 cm left renal stone. No hydronephrosis.

2. Nondiagnostic evaluation of the proximal aorta, urinary bladder, and pancreas.

3. Probable fat-containing small ventral hernia in the left lower quadrant of the abdomen. Recommend CT of the abdomen and pelvis with and without contrast.

## 2017-05-04 ENCOUNTER — Ambulatory Visit: Payer: Self-pay | Admitting: Nurse Practitioner

## 2020-05-31 ENCOUNTER — Inpatient Hospital Stay: Payer: Medicare HMO

## 2020-05-31 ENCOUNTER — Emergency Department: Payer: Medicare HMO

## 2020-05-31 ENCOUNTER — Inpatient Hospital Stay
Admission: EM | Admit: 2020-05-31 | Discharge: 2020-06-02 | DRG: 077 | Disposition: A | Discharge status: Home Health | Payer: Medicare HMO | Attending: Internal Medicine | Admitting: Internal Medicine

## 2020-05-31 ENCOUNTER — Other Ambulatory Visit: Payer: Self-pay

## 2020-05-31 DIAGNOSIS — G9340 Encephalopathy, unspecified: Secondary | ICD-10-CM | POA: Diagnosis not present

## 2020-05-31 DIAGNOSIS — I5033 Acute on chronic diastolic (congestive) heart failure: Secondary | ICD-10-CM | POA: Diagnosis present

## 2020-05-31 DIAGNOSIS — I5031 Acute diastolic (congestive) heart failure: Secondary | ICD-10-CM | POA: Diagnosis not present

## 2020-05-31 DIAGNOSIS — J9601 Acute respiratory failure with hypoxia: Secondary | ICD-10-CM | POA: Diagnosis present

## 2020-05-31 DIAGNOSIS — E11649 Type 2 diabetes mellitus with hypoglycemia without coma: Secondary | ICD-10-CM | POA: Diagnosis not present

## 2020-05-31 DIAGNOSIS — I169 Hypertensive crisis, unspecified: Secondary | ICD-10-CM | POA: Diagnosis not present

## 2020-05-31 DIAGNOSIS — R443 Hallucinations, unspecified: Secondary | ICD-10-CM | POA: Diagnosis not present

## 2020-05-31 DIAGNOSIS — E871 Hypo-osmolality and hyponatremia: Secondary | ICD-10-CM | POA: Diagnosis present

## 2020-05-31 DIAGNOSIS — E785 Hyperlipidemia, unspecified: Secondary | ICD-10-CM | POA: Diagnosis present

## 2020-05-31 DIAGNOSIS — I130 Hypertensive heart and chronic kidney disease with heart failure and stage 1 through stage 4 chronic kidney disease, or unspecified chronic kidney disease: Secondary | ICD-10-CM | POA: Diagnosis present

## 2020-05-31 DIAGNOSIS — N184 Chronic kidney disease, stage 4 (severe): Secondary | ICD-10-CM | POA: Diagnosis present

## 2020-05-31 DIAGNOSIS — Z20822 Contact with and (suspected) exposure to covid-19: Secondary | ICD-10-CM | POA: Diagnosis present

## 2020-05-31 DIAGNOSIS — I674 Hypertensive encephalopathy: Secondary | ICD-10-CM | POA: Diagnosis not present

## 2020-05-31 DIAGNOSIS — E1122 Type 2 diabetes mellitus with diabetic chronic kidney disease: Secondary | ICD-10-CM | POA: Diagnosis present

## 2020-05-31 DIAGNOSIS — E43 Unspecified severe protein-calorie malnutrition: Secondary | ICD-10-CM | POA: Diagnosis present

## 2020-05-31 DIAGNOSIS — I161 Hypertensive emergency: Secondary | ICD-10-CM | POA: Diagnosis present

## 2020-05-31 DIAGNOSIS — R7401 Elevation of levels of liver transaminase levels: Secondary | ICD-10-CM | POA: Diagnosis present

## 2020-05-31 DIAGNOSIS — R7989 Other specified abnormal findings of blood chemistry: Secondary | ICD-10-CM | POA: Diagnosis present

## 2020-05-31 DIAGNOSIS — Z794 Long term (current) use of insulin: Secondary | ICD-10-CM

## 2020-05-31 DIAGNOSIS — I10 Essential (primary) hypertension: Secondary | ICD-10-CM

## 2020-05-31 DIAGNOSIS — Z6841 Body Mass Index (BMI) 40.0 and over, adult: Secondary | ICD-10-CM | POA: Diagnosis not present

## 2020-05-31 DIAGNOSIS — E870 Hyperosmolality and hypernatremia: Secondary | ICD-10-CM | POA: Diagnosis present

## 2020-05-31 DIAGNOSIS — I5030 Unspecified diastolic (congestive) heart failure: Secondary | ICD-10-CM | POA: Diagnosis present

## 2020-05-31 DIAGNOSIS — E119 Type 2 diabetes mellitus without complications: Secondary | ICD-10-CM | POA: Diagnosis present

## 2020-05-31 LAB — CBC
HCT: 45.2 % (ref 39.0–52.0)
Hemoglobin: 14 g/dL (ref 13.0–17.0)
MCH: 29.5 pg (ref 26.0–34.0)
MCHC: 31 g/dL (ref 30.0–36.0)
MCV: 95.4 fL (ref 80.0–100.0)
Platelets: 302 10*3/uL (ref 150–400)
RBC: 4.74 MIL/uL (ref 4.22–5.81)
RDW: 14.3 % (ref 11.5–15.5)
WBC: 8.8 10*3/uL (ref 4.0–10.5)
nRBC: 0 % (ref 0.0–0.2)

## 2020-05-31 LAB — ETHANOL: Alcohol, Ethyl (B): <10 mg/dL (ref <10)

## 2020-05-31 LAB — COMPREHENSIVE METABOLIC PANEL
ALT: 81 U/L — ABNORMAL HIGH (ref 0–44)
AST: 110 U/L — ABNORMAL HIGH (ref 15–41)
Albumin: 2.3 g/dL — ABNORMAL LOW (ref 3.5–5.0)
Alkaline Phosphatase: 88 U/L (ref 38–126)
Anion gap: 10 (ref 5–15)
BUN: 38 mg/dL — ABNORMAL HIGH (ref 8–23)
CO2: 31 mmol/L (ref 22–32)
Calcium: 8.5 mg/dL — ABNORMAL LOW (ref 8.9–10.3)
Chloride: 106 mmol/L (ref 98–111)
Creatinine, Ser: 2.67 mg/dL — ABNORMAL HIGH (ref 0.61–1.24)
GFR, Estimated: 26 mL/min — ABNORMAL LOW (ref >60)
Glucose, Bld: 75 mg/dL (ref 70–99)
Potassium: 4.2 mmol/L (ref 3.5–5.1)
Sodium: 147 mmol/L — ABNORMAL HIGH (ref 135–145)
Total Bilirubin: 0.9 mg/dL (ref 0.3–1.2)
Total Protein: 5.6 g/dL — ABNORMAL LOW (ref 6.5–8.1)

## 2020-05-31 LAB — TROPONIN I (HIGH SENSITIVITY): Troponin I (High Sensitivity): 9 ng/L (ref <18)

## 2020-05-31 LAB — RESP PANEL BY RT-PCR (FLU A&B, COVID) ARPGX2
Influenza A by PCR: NEGATIVE
Influenza B by PCR: NEGATIVE
SARS Coronavirus 2 by RT PCR: NEGATIVE

## 2020-05-31 LAB — AMMONIA: Ammonia: <9 umol/L — ABNORMAL LOW (ref 9–35)

## 2020-05-31 LAB — TSH: TSH: 3.406 u[IU]/mL (ref 0.350–4.500)

## 2020-05-31 LAB — ACETAMINOPHEN LEVEL: Acetaminophen (Tylenol), Serum: <10 ug/mL — ABNORMAL LOW (ref 10–30)

## 2020-05-31 LAB — MAGNESIUM: Magnesium: 2.1 mg/dL (ref 1.7–2.4)

## 2020-05-31 LAB — BRAIN NATRIURETIC PEPTIDE: B Natriuretic Peptide: 695.5 pg/mL — ABNORMAL HIGH (ref 0.0–100.0)

## 2020-05-31 MED ORDER — HYDROCHLOROTHIAZIDE 25 MG PO TABS
25.0000 mg | ORAL_TABLET | Freq: Every day | ORAL | Status: DC
  Administered 2020-05-31: 25 mg via ORAL
  Filled 2020-05-31 (×2): qty 1

## 2020-05-31 MED ORDER — LABETALOL HCL 5 MG/ML IV SOLN
10.0000 mg | Freq: Once | INTRAVENOUS | Status: AC
  Administered 2020-05-31: 10 mg via INTRAVENOUS
  Filled 2020-05-31: qty 4

## 2020-05-31 MED ORDER — AMLODIPINE-VALSARTAN-HCTZ 10-320-25 MG PO TABS: 1.0000 | ORAL_TABLET | Freq: Every day | ORAL | Status: DC

## 2020-05-31 MED ORDER — AMLODIPINE BESYLATE 10 MG PO TABS
10.0000 mg | ORAL_TABLET | Freq: Every day | ORAL | Status: DC
  Administered 2020-05-31 – 2020-06-02 (×3): 10 mg via ORAL
  Filled 2020-05-31: qty 1
  Filled 2020-05-31 (×2): qty 2

## 2020-05-31 MED ORDER — LABETALOL HCL 5 MG/ML IV SOLN
10.0000 mg | INTRAVENOUS | Status: DC | PRN
  Administered 2020-06-01: 10 mg via INTRAVENOUS
  Filled 2020-05-31: qty 4

## 2020-05-31 MED ORDER — LACTATED RINGERS IV BOLUS: 1000.0000 mL | Freq: Once | INTRAVENOUS | Status: DC

## 2020-05-31 MED ORDER — IRBESARTAN 150 MG PO TABS
300.0000 mg | ORAL_TABLET | Freq: Every day | ORAL | Status: DC
  Filled 2020-05-31 (×3): qty 2

## 2020-05-31 NOTE — ED Notes (Signed)
Hourly rounding completed at this time, patient currently awake in room. No complaints, stable, and in no acute distress. Q15 minute rounds and monitoring via Rover and Officer to continue. °

## 2020-05-31 NOTE — ED Provider Notes (Signed)
[facility] Emergency Department Provider Note  ____________________________________________   Event Date/Time   First MD Initiated Contact with Patient 05/31/20 2027     (approximate)  I have reviewed the triage vital signs and the nursing notes.   HISTORY  Chief Complaint Psychiatric Evaluation   HPI Victor Hernandez is a 65 y.o. male with a past medical history of HTN, HDL, DM and no prior psychiatric history who presents accompanied by police for assessment of some hallucinations.  Police reportedly called out to the patient's home by patient multiple times today patient complaining of people in his yard.  Patient states he has spoken multiple in his yard today went to check out some "trees".  He states he was brought to the emergency room because police were marred he might "go after them because they told him they were not working there anymore".  Per PD there was no refill seen on patient's property.  Patient states he currently does not have any acute physical complaints including headache, earache, sore throat, vertigo, chest pain, cough, shortness of breath, dental pain, back pain, nausea, vomiting, diarrhea, dysuria, or recent rash.  He denies any recent traumatic injuries.  He denies EtOH use, illicit drug use, or tobacco abuse.  Denies any history of hallucinations and states he does not think he is hallucinating but is not sure after please tell them he was.  He denies any psychiatric history.  He does not recall his blood pressure medicines or what other medicines he is currently taking.          Past Medical History:  Diagnosis Date  . Diabetes mellitus without complication ([place])   . Hyperlipidemia   . Hypertension     There are no problems to display for this patient.   Past Surgical History:  Procedure Laterality Date  . COLONOSCOPY    . COLONOSCOPY WITH PROPOFOL N/A 05/19/2016   Procedure: COLONOSCOPY WITH PROPOFOL;  Surgeon: Christene Lye, MD;  Location: [facility] ENDOSCOPY;  Service: Endoscopy;  Laterality: N/A;    Prior to Admission medications   Medication Sig Start Date End Date Taking? Authorizing Provider  Amlodipine-Valsartan-HCTZ 10-320-25 MG TABS Take 1 tablet by mouth daily. for high blood pressure 04/09/16   [provider]  aspirin EC 81 MG tablet Take 81 mg by mouth daily.    [provider]  furosemide (LASIX) 40 MG tablet Take 40 mg by mouth daily.    [provider]  HUMALOG MIX 75/25 KWIKPEN (75-25) 100 UNIT/ML Kwikpen INJECT 66 UNITS SUBCUTANEOUSLY EVERY MORNING AS DIRECTED 04/23/16   [provider]  lovastatin (MEVACOR) 20 MG tablet TAKE 1 TABLET BY MOUTH EVERY DAY FOR CHOLESTEROL 04/09/16   [provider]  polyethylene glycol powder (GLYCOLAX/MIRALAX) powder 255 grams one bottle for colonoscopy prep 05/17/16   Christene Lye, MD  Potassium Chloride (KLOR-CON PO) Take 20 mg by mouth.    [provider]    Allergies Patient has no known allergies.  No family history on file.  Social History Social History   Tobacco Use  . Smoking status: Never Smoker  . Smokeless tobacco: Never Used  Substance Use Topics  . Alcohol use: No  . Drug use: No    Review of Systems  Review of Systems  Constitutional: Negative for chills and fever.  HENT: Negative for sore throat.   Eyes: Negative for pain.  Respiratory: Negative for cough and stridor.   Cardiovascular: Negative for chest pain.  Gastrointestinal: Negative for vomiting.  Genitourinary: Negative for dysuria.  Musculoskeletal: Negative for myalgias.  Skin: Negative for rash.  Neurological: Negative for seizures, loss of consciousness and headaches.  Psychiatric/Behavioral: Positive for hallucinations. Negative for suicidal ideas.  All other systems reviewed and are negative.     ____________________________________________   PHYSICAL EXAM:  VITAL SIGNS: ED Triage Vitals  [05/31/20 2026]  Enc Vitals Group     BP      Pulse Rate (!) 114     Resp 20     Temp 98.6 F (37 C)     Temp Source Oral     SpO2 93 %     Weight      Height      Head Circumference      Peak Flow      Pain Score      Pain Loc      Pain Edu?      Excl. in [place]?    Vitals:   05/31/20 2143 05/31/20 2200  BP:  (!) 157/114  Pulse:  (!) 107  Resp:  16  Temp:    SpO2: 95% 95%   Physical Exam Vitals and nursing note reviewed.  Constitutional:      Appearance: He is well-developed and well-nourished. He is obese.  HENT:     Head: Normocephalic and atraumatic.     Right Ear: External ear normal.     Left Ear: External ear normal.     Nose: Nose normal.  Eyes:     Conjunctiva/sclera: Conjunctivae normal.  Cardiovascular:     Rate and Rhythm: Regular rhythm. Tachycardia present.     Heart sounds: No murmur heard.   Pulmonary:     Effort: Pulmonary effort is normal. No respiratory distress.     Breath sounds: Normal breath sounds.  Abdominal:     Palpations: Abdomen is soft.     Tenderness: There is no abdominal tenderness.  Musculoskeletal:        General: No edema.     Cervical back: Neck supple. No rigidity.  Skin:    General: Skin is warm and dry.     Capillary Refill: Capillary refill takes less than 2 seconds.  Neurological:     Mental Status: He is alert and oriented to person, place, and time.  Psychiatric:        Attention and Perception: He perceives visual hallucinations.        Mood and Affect: Mood and affect and mood normal.        Thought Content: Thought content does not include homicidal or suicidal ideation.     Cranial nerves II through XII grossly intact.  No pronator drift.  No finger dysmetria.  Symmetric 5/5 strength of all extremities.  Sensation intact to light touch in all extremities.  Unremarkable unassisted gait.  ____________________________________________   LABS (all labs ordered are listed, but only abnormal results are  displayed)  Labs Reviewed  COMPREHENSIVE METABOLIC PANEL - Abnormal; Notable for the following components:      Result Value   Sodium 147 (*)    BUN 38 (*)    Creatinine, Ser 2.67 (*)    Calcium 8.5 (*)    Total Protein 5.6 (*)    Albumin 2.3 (*)    AST 110 (*)    ALT 81 (*)    GFR, Estimated 26 (*)    All other components within normal limits  AMMONIA - Abnormal; Notable for the following components:   Ammonia <  9 (*)    All other components within normal limits  BRAIN NATRIURETIC PEPTIDE - Abnormal; Notable for the following components:   B Natriuretic Peptide 695.5 (*)    All other components within normal limits  ACETAMINOPHEN LEVEL - Abnormal; Notable for the following components:   Acetaminophen (Tylenol), Serum <10 (*)    All other components within normal limits  RESP PANEL BY RT-PCR (FLU A&B, COVID) ARPGX2  CBC  ETHANOL  TSH  MAGNESIUM  URINALYSIS, COMPLETE (UACMP) WITH MICROSCOPIC  URINE DRUG SCREEN, QUALITATIVE ([facility] ONLY)  PROCALCITONIN  TROPONIN I (HIGH SENSITIVITY)  TROPONIN I (HIGH SENSITIVITY)   ____________________________________________  EKG  Sinus tachycardia with a ventricular rate of 118, normal axis, unremarkable intervals, some nonspecific changes versus artifact in V4 and V5 with other clear evidence of acute ischemia.  ____________________________________________  RADIOLOGY  ED MD interpretation: CT head shows no evidence of acute intracranial hemorrhage or subacute ischemia or like intracranial process.  Chest x-ray shows possible infiltrate in the left lower lobe this is not well  delineated from possible parapneumonic effusion.    Official radiology report(s): DG Chest 2 View  Result Date: 05/31/2020 CLINICAL DATA:  Tachycardia EXAM: CHEST - 2 VIEW COMPARISON:  None. FINDINGS: Lung volumes are small but are symmetric. There is mild focal pulmonary infiltrate within the left lower lobe, possibly infectious in the acute setting. Small  associated pleural effusion is present. No pneumothorax. Right lung is clear. No pleural effusion on the right. Cardiac size within normal limits. Pulmonary vascularity is normal. No acute bone abnormality. IMPRESSION: Focal infiltrate within the left lung base, possibly infectious in etiology with associated small parapneumonic effusion. Electronically Signed   By: Fidela Salisbury MD   On: 05/31/2020 22:19   CT Head Wo Contrast  Result Date: 05/31/2020 CLINICAL DATA:  Altered mental status, visual hallucinations EXAM: CT HEAD WITHOUT CONTRAST TECHNIQUE: Contiguous axial images were obtained from the base of the skull through the vertex without intravenous contrast. COMPARISON:  None. FINDINGS: Brain: Normal anatomic configuration. Parenchymal volume loss is commensurate with the patient's age. Mild periventricular white matter changes are present likely reflecting the sequela of small vessel ischemia. No abnormal intra or extra-axial mass lesion or fluid collection. No abnormal mass effect or midline shift. No evidence of acute intracranial hemorrhage or infarct. Ventricular size is normal. Cerebellum unremarkable. Vascular: No asymmetric hyperdense vasculature at the skull base. Skull: Intact Sinuses/Orbits: Paranasal sinuses are clear. Orbits are unremarkable. Other: Mastoid air cells and middle ear cavities are clear. IMPRESSION: No acute intracranial abnormality.  Mild senescent changes. Electronically Signed   By: Fidela Salisbury MD   On: 05/31/2020 22:23  ____________________________________________   PROCEDURES  Procedure(s) performed (including Critical Care):  .1-3 Lead EKG Interpretation Performed by: Lucrezia Starch, MD Authorized by: Lucrezia Starch, MD     Interpretation: normal     ECG rate assessment: tachycardic     Rhythm: sinus tachycardia     Ectopy: none     Conduction: normal   .Critical Care Performed by: Lucrezia Starch, MD Authorized by: Lucrezia Starch, MD    Critical care provider statement:    Critical care time (minutes):  45   Critical care time was exclusive of:  Separately billable procedures and treating other patients   Critical care was necessary to treat or prevent imminent or life-threatening deterioration of the following conditions:  Respiratory failure   Critical care was time spent personally by me on the following activities:  Discussions with consultants, evaluation of patient's response to treatment, examination of patient, ordering and performing treatments and interventions, ordering and review of laboratory studies, ordering and review of radiographic studies, pulse oximetry, re-evaluation of patient's condition, obtaining history from patient or surrogate and review of old charts     ____________________________________________   [place] / [place] / ED COURSE      Patient presents accompanied by police voluntarily for assessment of concern for hallucinations.  On arrival patient is hypertensive with a BP of 200/136 as well as tachycardic with a heart rate of 114 otherwise stable vital signs on room air..   Patient does endorse seeing several people in his yard earlier today and per report is not seen by police he does not seem actively psychotic on exam. Suicidal or homicidal.  He has a nonfocal neuro exam.  He is morbidly obese and is notably tachycardic.  We will certainly possible patient has underlying undiagnosed psychiatric disorder concern for possible organic etiology as well as uncontrolled hypertension with a systolic of 734 on arrival.  No focal deficits to suggest CVA.  CT head shows no evidence of intracranial hemorrhage or other acute intracranial process.  Low suspicion for toxic ingestion. No history or exam findings to suggest trauma.    With regard to patient's blood pressure he was given 1 dose of labetalol and home BP meds with improvement to 157/114 as well as improvement in  his heart rate of 107.  Possible patient was experiencing some hypertensive encephalopathy although this is not as to explain his other lab derangements.  EKG and troponin do not show any evidence of ACS or ischemia.  No evidence of acute arrhythmia.  Ethanol and significant undetectable.  CMP remarkable for hyponatremia with an NA of 147, creatinine of 2.67, AST of 110, ALT of 81 and otherwise no significant derangements although there are no recent labs to compare to.  Unclear baseline creatinine.  CBC is unremarkable.  No evidence of leukocytosis or acute anemia.  TSH is WNL.  Magnesium is WNL.  Covid is negative and ammonia is undetectable.  BNP is elevated at 695.  Given these findings chest x-ray obtained shows findings concerning for possible left lobe infiltrate with pulmonary edema with large effusion, no thorax or other clear acute thoracic process.  We will plan to obtain the ultrasound to assess for any evidence of hydronephrosis or other clear etiology for patient's kidney function as a measure of his baseline.  Also plan to obtain CT chest to better assess presence of any pneumonia versus heart failure.  Given hypoxic respiratory failure patient will require admission to hospitalist service for further evaluation and management.  ____________________________________________   FINAL CLINICAL IMPRESSION(S) / ED DIAGNOSES  Final diagnoses:  Acute respiratory failure with hypoxia (HCC)  Elevated brain natriuretic peptide (BNP) level  Hypernatremia  Transaminitis  Hallucinations  Hypertension, unspecified type  Creatinine elevation    Medications  amLODipine (NORVASC) tablet 10 mg (10 mg Oral Given 05/31/20 2128)    And  irbesartan (AVAPRO) tablet 300 mg (has no administration in time range)    And  hydrochlorothiazide (HYDRODIURIL) tablet 25 mg (25 mg Oral Given 05/31/20 2128)  labetalol (NORMODYNE) injection 10 mg (10 mg Intravenous Given 05/31/20 2127)     ED Discharge Orders     None       Note:  This document was prepared using Dragon voice recognition software and may include unintentional dictation errors.   Lucrezia Starch,  MD 05/31/20 2317

## 2020-05-31 NOTE — ED Notes (Signed)
Pt reports he has had a man in his yard and coming in and out of his treeline through the day. States the man will not answer, only shake his head when he speaks to him. PT reports there have been 2-3 other men with this individual that he describes as a hippie. Pt states the other individuals have told him that he is here to look at trees. This nurse asked pt if this has occurred before and pt denies, then states that he does have at times men come and knock on his door and ask to walk through. Pt allows this to happen and states that after the said individuals leave. Pt states his last call to PD tonight before coming to ED was due to fear of safety. States he did not want the "hippie" to come to his home at night due to living by self.  PD who brought pt to ED spoke to this nurse. States that [employer] have been to pt home x3 today, with the last visit resulting in pt being brought here vol. PD states that first call was due to pt seeing children in yard and when they were at the residence pt states he was going to kill them if they came back. 2nd visit, pt reported to PD same as he reports to this nurse. After second visit, PD states that counselor from [place] visited home and reported to PD that if they return pt would require an IVC and psych eval at ED. This was communicated with Dr. Tamala Julian.

## 2020-05-31 NOTE — H&P (Signed)
History and Physical    PLEASE NOTE THAT DRAGON DICTATION SOFTWARE WAS USED IN THE CONSTRUCTION OF THIS NOTE.   Victor Hernandez KGM:[id number] DOB: [birth year] DOA: 05/31/2020  PCP: Derwood Kaplan, MD Patient coming from: home   I have personally briefly reviewed patient's old medical records in [facility]  Chief Complaint: Altered mental status  HPI: Victor Hernandez is a 65 y.o. male with medical history significant for hypertension, type 2 diabetes mellitus, who is admitted to [facility] on 05/31/2020 with acute encephalopathy after presenting from home to [facility] Emergency Department for evaluation of altered mental status.   The patient reportedly called local police after his house 3 times earlier today to investigate various individuals that the patient reported were trespassing on his property.  He reportedly was actively seeing individuals ambulating around his property at the time that police were present, although they were reportedly unable to see the individuals that the patient was reportedly visualizing.  This reportedly occurred on 3 separate occasions, with local police reporting to the patient's house each time, with similar suspicion for visual hallucinations, which I can find no documentation of prior episodes per chart review.  Additionally, per chart review, no documentation of history of any psychiatric pathology.   Medical history is notable for history of essential hypertension, for which the patient reportedly takes HCTZ, valsartan, amlodipine, and Lasix.  Currently unclear if the patient has maintained recent compliance with his outpatient hypertensive regimen.  Via chart review, I have not encountered any prior documentation chronic heart failure, although I have also not found any results of prior echocardiograms.  No documentation of chronic kidney disease, although once again, no evidence of prior creatinine data points identified via chart review.   Additionally, no evidence per chart review of any chronic baseline supplemental oxygen requirements.    ED Course:  Vital signs in the ED were notable for the following: Temperature max 98.6; heart rate 74-1 14; initial blood pressure noted to be 200/136, which has decreased to 157/114 following resumption of home antihypertensive medications; respiratory rate 16-28; initial oxygen saturation 88% on room air, which is increased to 96% on 2 L nasal cannula.  Labs were notable for the following: CMP was notable for the following: Sodium 147, potassium 4.2, bicarbonate 31, BUN 38, creatinine 2.60 with no prior creatinine data point available in the EMR for point comparison, glucose 75.  Ammonia less than 9.  TSH 3.4; serum ethanol less than 0.10; CBC notable for white blood cell count of 8800, hemoglobin 14.  High-sensitivity troponin I initially found to be 8, with repeat value noted to be 9.  Urinalysis as well as urinary drug screen have been ordered, with results currently pending.  BNP 700 without any prior BNP data point available for point comparison.  Nasopharyngeal COVID-19/influenza PCR were checked in the ED this evening and found to be negative.  CT chest showed evidence of cardiomegaly with trace pericardial effusion, evidence of bilateral pleural effusions, dependent atelectasis in the bilateral lower lobes, and no evidence of acute airspace disease as well as no evidence of pneumothorax.  In the context of unclear chronicity relating to patient's presenting elevated creatinine level, renal ultrasound has been ordered, with result currently pending.  While in the ED, the following were administered: The following home antihypertensive medications for administered in the ED this evening: Amlodipine 10 mg p.o. x1, HCTZ 25 mg p.o. x1.  Subsequently, patient was admitted for further evaluation management of  presenting acute encephalopathy as well as additional evaluation management of hypertensive  crisis and management of acute hypoxic respiratory failure in the setting of potential acute heart failure with preserved EF.    Review of Systems: As per HPI otherwise 10 point review of systems negative.   Past Medical History:  Diagnosis Date  . Diabetes mellitus without complication (HCC)   . Hyperlipidemia   . Hypertension     Past Surgical History:  Procedure Laterality Date  . COLONOSCOPY    . COLONOSCOPY WITH PROPOFOL N/A 05/19/2016   Procedure: COLONOSCOPY WITH PROPOFOL;  Surgeon: Kieth Brightly, MD;  Location: [facility] ENDOSCOPY;  Service: Endoscopy;  Laterality: N/A;    Social History:  reports that he has never smoked. He has never used smokeless tobacco. He reports that he does not drink alcohol and does not use drugs.   No Known Allergies  No family history on file.   Prior to Admission medications   Medication Sig Start Date End Date Taking? Authorizing Provider  Amlodipine-Valsartan-HCTZ 10-320-25 MG TABS Take 1 tablet by mouth daily. for high blood pressure 04/09/16   [provider]  aspirin EC 81 MG tablet Take 81 mg by mouth daily.    [provider]  furosemide (LASIX) 40 MG tablet Take 40 mg by mouth daily.    [provider]  HUMALOG MIX 75/25 KWIKPEN (75-25) 100 UNIT/ML Kwikpen INJECT 45 UNITS SUBCUTANEOUSLY EVERY MORNING AS DIRECTED 04/23/16   [provider]  lovastatin (MEVACOR) 20 MG tablet TAKE 1 TABLET BY MOUTH EVERY DAY FOR CHOLESTEROL 04/09/16   [provider]  polyethylene glycol powder (GLYCOLAX/MIRALAX) powder 255 grams one bottle for colonoscopy prep 05/17/16   Kieth Brightly, MD  Potassium Chloride (KLOR-CON PO) Take 20 mg by mouth.    [provider]     Objective    Physical Exam: Vitals:   05/31/20 2127 05/31/20 2136 05/31/20 2143 05/31/20 2200  BP: (!) 188/133 (!) 190/105  (!) 157/114  Pulse:  (!) 111  (!) 107  Resp:  (!) 24  16  Temp:      TempSrc:      SpO2:   (!) 88% 95% 95%  Weight:      Height:        General: appears to be stated age; alert; mildly confused Skin: warm, dry, no rash Head:  AT/[place] Eyes:  PEARL b/l, EOMI Mouth:  Oral mucosa membranes appear moist, normal dentition Neck: supple; trachea midline Heart:  RRR; did not appreciate any M/R/G Lungs: CTAB, did not appreciate any wheezes, rales, or rhonchi Abdomen: + BS; soft, ND, NT Vascular: 2+ pedal pulses b/l; 2+ radial pulses b/l Extremities: no peripheral edema, no muscle wasting Neuro: strength and sensation intact in upper and lower extremities b/l   Labs on Admission: I have personally reviewed following labs and imaging studies  CBC: Recent Labs  Lab 05/31/20 2030  WBC 8.8  HGB 14.0  HCT 45.2  MCV 95.4  PLT 302   Basic Metabolic Panel: Recent Labs  Lab 05/31/20 2030  NA 147*  K 4.2  CL 106  CO2 31  GLUCOSE 75  BUN 38*  CREATININE 2.67*  CALCIUM 8.5*  MG 2.1   GFR: Estimated Creatinine Clearance: 31 mL/min (A) (by C-G formula based on SCr of 2.67 mg/dL (H)). Liver Function Tests: Recent Labs  Lab 05/31/20 2030  AST 110*  ALT 81*  ALKPHOS 88  BILITOT 0.9  PROT 5.6*  ALBUMIN 2.3*  No results for input(s): LIPASE, AMYLASE in the last 168 hours. Recent Labs  Lab 05/31/20 2123  AMMONIA <9*   Coagulation Profile: No results for input(s): INR, PROTIME in the last 168 hours. Cardiac Enzymes: No results for input(s): CKTOTAL, CKMB, CKMBINDEX, TROPONINI in the last 168 hours. BNP (last 3 results) No results for input(s): PROBNP in the last 8760 hours. HbA1C: No results for input(s): HGBA1C in the last 72 hours. CBG: No results for input(s): GLUCAP in the last 168 hours. Lipid Profile: No results for input(s): CHOL, HDL, LDLCALC, TRIG, CHOLHDL, LDLDIRECT in the last 72 hours. Thyroid Function Tests: Recent Labs    05/31/20 2030  TSH 3.406   Anemia Panel: No results for input(s): VITAMINB12, FOLATE, FERRITIN, TIBC, IRON, RETICCTPCT in  the last 72 hours. Urine analysis: No results found for: COLORURINE, APPEARANCEUR, LABSPEC, PHURINE, GLUCOSEU, HGBUR, BILIRUBINUR, KETONESUR, PROTEINUR, UROBILINOGEN, NITRITE, LEUKOCYTESUR  Radiological Exams on Admission: DG Chest 2 View  Result Date: 05/31/2020 CLINICAL DATA:  Tachycardia EXAM: CHEST - 2 VIEW COMPARISON:  None. FINDINGS: Lung volumes are small but are symmetric. There is mild focal pulmonary infiltrate within the left lower lobe, possibly infectious in the acute setting. Small associated pleural effusion is present. No pneumothorax. Right lung is clear. No pleural effusion on the right. Cardiac size within normal limits. Pulmonary vascularity is normal. No acute bone abnormality. IMPRESSION: Focal infiltrate within the left lung base, possibly infectious in etiology with associated small parapneumonic effusion. Electronically Signed   By: Helyn Numbers MD   On: 05/31/2020 22:19   CT Head Wo Contrast  Result Date: 05/31/2020 CLINICAL DATA:  Altered mental status, visual hallucinations EXAM: CT HEAD WITHOUT CONTRAST TECHNIQUE: Contiguous axial images were obtained from the base of the skull through the vertex without intravenous contrast. COMPARISON:  None. FINDINGS: Brain: Normal anatomic configuration. Parenchymal volume loss is commensurate with the patient's age. Mild periventricular white matter changes are present likely reflecting the sequela of small vessel ischemia. No abnormal intra or extra-axial mass lesion or fluid collection. No abnormal mass effect or midline shift. No evidence of acute intracranial hemorrhage or infarct. Ventricular size is normal. Cerebellum unremarkable. Vascular: No asymmetric hyperdense vasculature at the skull base. Skull: Intact Sinuses/Orbits: Paranasal sinuses are clear. Orbits are unremarkable. Other: Mastoid air cells and middle ear cavities are clear. IMPRESSION: No acute intracranial abnormality.  Mild senescent changes. Electronically Signed    By: Helyn Numbers MD   On: 05/31/2020 22:23      Assessment/Plan   Victor Hernandez is a 65 y.o. male with medical history significant for hypertension, type 2 diabetes mellitus, who is admitted to [facility] on 05/31/2020 with acute encephalopathy after presenting from home to [facility] Emergency Department for evaluation of altered mental status.    Principal Problem:   Acute encephalopathy Active Problems:   Diabetes mellitus without complication (HCC)   Elevated serum creatinine   (HFpEF) heart failure with preserved ejection fraction (HCC)   Hypertensive crisis    #) Acute encephalopathy: The patient presents for evaluation of confusion and visual hallucinations which appear to be acute over the last 1 day, all in the context of known psychiatric history.  Differential includes hypertensive encephalopathy given presenting blood pressure of 200/136.  No evidence of associated infectious process at this time, including CT chest showing no evidence of infiltrate, while nasopharyngeal COVID-19/influenza PCR found to be negative.  However, I urinalysis result is currently pending.  Additionally, ammonia found to be nonelevated at  less than 9, while TSH was found to be within normal limits.  Urinary drug screen has been ordered in the ED, with result currently pending.  Given the patient's body habitus we will also check VBG to evaluate for any contribution from hypercapnic encephalopathy.  While less likely, differential includes acute ischemic in the context of presenting significantly elevated blood pressure, although absence of acute focal neurologic deficits renders this possibility be less likely, while presenting CT that shows no evidence of acute intracranial process.  Plan: Check VBG.  Monitor for results of urinalysis as well as urinary drug screen.  Recommend management of hypertensive crisis, as further described below.  Neurochecks every 4 hours x3 occurrences.  Should  ensuing acute encephalopathy evaluation be unremarkable and mental status does not improve with enhanced blood pressure control, could consider obtaining MRI brain.        #) Hypertensive crisis: On the basis of presenting blood pressure of 200/136, with concomitant presenting confusion and acute hallucinations over the course of the last day.  This is in the context of a documented history of hypertension, for which the patient is on several antihypertensive medications at home, as further outlined above.  Compliance with his outpatient and hypertensive regimen is currently unclear to me.  Differential includes acute ischemic CVA, although this is felt to be less likely, as further described above.  Urinary drug screen has been ordered, with result currently pending.  Presenting labs may reflect evidence of associated endorgan damage in the context of presenting serum creatinine of 2.67, although baseline creatinine level is currently unclear to me.  There also appears to be mild elevation in patient's presenting transaminases, which also may represent an element of endorgan damage in the setting.  Presentation does not appear to be consistent with aortic dissection.   In terms of short-term blood pressures goals relating to this presentation: a 10-20% reduction in systolic blood pressure or mean arterial pressure over the first hour following presentation, with subsequent short-term blood pressure goal of an reduction by additional 5 to 15% over the next 24 hours, which would coincide with goal systolic blood pressure of 170 and goal diastolic blood pressure 110.  Will initiate as needed IV labetalol to be administered to maintain these goal blood pressures.  If ensuing difficulty meeting these goals blood pressures, could consider initiating nitroglycerin drip, which may also have some cardiac benefit given evidence of mild presenting acutely decompensated heart failure, as further described below.      Plan: closely monitor ensuing BP's, with goal BP's as outlined above.  As needed IV labetalol ordered, with consideration for initiation of nitroglycerin drip if unable to consistently maintain the school blood pressures, as further described above in the context of mild acutely decompensated heart failure.  Continue amlodipine as well as HCTZ.  Hold valsartan for now given unclear chronicity of elevated creatinine.  Repeat CMP in the morning.  Monitor strict I's and O's and daily weights.  Monitor for result of urinary drug screen.  Serial neuro checks, as above.  Monitor on telemetry.     #) Acute hypoxic respiratory distress: Presenting O2 sat noted to be 88% on room air, with subsequent improvement to 96% on 2 L nasal cannula in the context of no known baseline supplemental oxygen requirements.  Potentially multifactorial in etiology, with suspected contribution from mild acute heart failure with preserved EF given elevated BNP as well as evidence of bilateral pleural effusions in the setting of cardiomegaly.  There also  appears to be additional contribution from bibasilar atelectasis in the context of patient's presenting acute encephalopathy, likely due to suboptimal inspiratory effort.  Of note, nasopharyngeal COVID-19/influenza PCR were found to be negative.  No evidence of infiltrate to suggest pneumonia on CTA chest, nor any evidence of pneumothorax.  ACS appears less likely.   Plan: Work-up and management of suspected acute heart failure with preserved EF, including IV diuresis, as above.  Monitor on telemetry.  Monitor continuous pulse oximetry.  Repeat CBC with differential in the morning.      #) Acute heart failure with preserved EF: In the context of no prior documentation of a history of heart failure, suspect an element of mild acute decompensated heart failure on the basis of presenting acute hypoxic respiratory distress, with elevated BNP, evidence of cardiomegaly as well as  bilateral pleural effusions.  Suspect potential contribution from diminished cardiac output in the setting of significant increase in afterload in the context of presenting hypertensive crisis, as above.  Of note, outpatient medication regimen includes daily Lasix, although no prior echocardiogram has been identified via chart review thus far.   Plan: Work-up and management of presenting hypertensive crisis to ensure improvement and afterload reduction in order to enhance cardiac output, as above.  Consider initiation of nitroglycerin drip for this purpose.  Lasix 40 mg IV x1.  Reevaluate volume status in the morning, with consideration for additional diuresis efforts based upon this evaluation.  Repeat serum magnesium level.  Repeat CMP in the morning, with close attention to interval trend in renal function as well as serum potassium level.  Monitor on symmetry.  Monitor continuous pulse oximetry.  Following improvement in blood pressure, can also consider obtaining echocardiogram.       #) Elevated creatinine: Presenting creatinine of 2.67 associated with unclear chronicity in the absence of any prior creatinine data points available per EMR review at this time.  Potentially represents chronic kidney disease versus AKI as a consequence of endorgan damage in the setting of presenting hypertensive crisis.  Given this unclear chronicity, renal ultrasound was obtained in the ED this evening, with result currently pending.  Will provide gentle IV diuresis overnight for the benefit of improved antihypertensive control as well as management of suspected acutely decompensated heart failure, as above, with close monitoring of ensuing trend in renal function.  Plan: Follow for result of renal ultrasound.  Monitor strict I's and O's and daily weights.  Attempt avoid nephrotoxic agents.  Repeat BMP in the morning.  Hold home valsartan for now.  Follow for result of urinalysis with microscopic evaluation.   Additionally, will add on random urine sodium as well as random urine creatinine.      #) Type 2 diabetes mellitus: Humalog 75/25 at a dose of 45 units subcu every morning as an outpatient.  No prior hemoglobin A1c value available in our EMR at this time.  Presenting blood sugar noted to be 75 per presenting CMP.  Plan: Check hemoglobin A1c.  Hold Humalog for now.  Accu-Cheks before every meal and at bedtime with associated low-dose sliding scale insulin.     DVT prophylaxis: SCDs Code Status: Full code Family Communication: none Disposition Plan: Per Rounding Team Consults called: none  Admission status: Inpatient;    Of note, this patient was added by me to the following Admit List/Treatment Team:  armcadmits      PLEASE NOTE THAT DRAGON DICTATION SOFTWARE WAS USED IN THE CONSTRUCTION OF THIS NOTE.   Angie Fava  DO [facility] Pager [phone] From 12PM- 12AM  Otherwise, please contact night-coverage  [email or web address] Password [email or web address]  05/31/2020, 11:13 PM

## 2020-05-31 NOTE — ED Notes (Signed)
Pt to be moved to room 5 after returning from CT/XRAY. Report provided to Gregary Signs, RN at this time.

## 2020-05-31 NOTE — ED Triage Notes (Signed)
Pt brought in voluntarily states police was called out because they stated he was having visual hallucinations. No psych history or SI or HI.

## 2020-05-31 NOTE — ED Notes (Signed)
Patient transferred from Triage to room 20 after dressing out and screening for contraband. Report received from Raquel RN including situation, background, assessment and recommendations. Pt oriented to [employer] including Q15 minute rounds as well as Engineer, drilling for their protection. Patient is alert and oriented, warm and dry in no acute distress. Patient denies SI, HI, and AVH, but VH are present. Pt. Encouraged to let this nurse know if needs arise.

## 2020-05-31 NOTE — ED Notes (Signed)
Pt placed on 2 LPM [place]

## 2020-05-31 NOTE — ED Notes (Addendum)
Sneakers Socks  [employer]  Removed and locked in secure unit

## 2020-05-31 NOTE — ED Notes (Signed)
XRAY here to pick up pt now. Continue to await for pt medication to arrive from pharmacy

## 2020-05-31 NOTE — ED Notes (Signed)
Pt is on heart monitor, garage door is open in room. Pt is ready for CT and scans. CT called at this time

## 2020-06-01 ENCOUNTER — Encounter: Payer: Self-pay | Admitting: Internal Medicine

## 2020-06-01 ENCOUNTER — Inpatient Hospital Stay: Payer: Medicare HMO

## 2020-06-01 ENCOUNTER — Inpatient Hospital Stay
Admit: 2020-06-01 | Discharge: 2020-06-01 | Disposition: A | Discharge status: Home / Self Care | Payer: Medicare HMO | Attending: Internal Medicine | Admitting: Internal Medicine

## 2020-06-01 DIAGNOSIS — I161 Hypertensive emergency: Secondary | ICD-10-CM | POA: Diagnosis present

## 2020-06-01 DIAGNOSIS — E119 Type 2 diabetes mellitus without complications: Secondary | ICD-10-CM | POA: Diagnosis present

## 2020-06-01 DIAGNOSIS — I5030 Unspecified diastolic (congestive) heart failure: Secondary | ICD-10-CM | POA: Diagnosis present

## 2020-06-01 DIAGNOSIS — I169 Hypertensive crisis, unspecified: Secondary | ICD-10-CM | POA: Diagnosis present

## 2020-06-01 DIAGNOSIS — E1122 Type 2 diabetes mellitus with diabetic chronic kidney disease: Secondary | ICD-10-CM | POA: Diagnosis present

## 2020-06-01 DIAGNOSIS — R7989 Other specified abnormal findings of blood chemistry: Secondary | ICD-10-CM | POA: Diagnosis present

## 2020-06-01 DIAGNOSIS — I340 Nonrheumatic mitral (valve) insufficiency: Secondary | ICD-10-CM

## 2020-06-01 DIAGNOSIS — G9340 Encephalopathy, unspecified: Secondary | ICD-10-CM | POA: Diagnosis not present

## 2020-06-01 HISTORY — DX: Nonrheumatic mitral (valve) insufficiency: I34.0

## 2020-06-01 LAB — URINALYSIS, COMPLETE (UACMP) WITH MICROSCOPIC
Bacteria, UA: NONE SEEN
Bilirubin Urine: NEGATIVE
Glucose, UA: 50 mg/dL — AB
Ketones, ur: NEGATIVE mg/dL
Leukocytes,Ua: NEGATIVE
Nitrite: NEGATIVE
Protein, ur: >=300 mg/dL — AB
Specific Gravity, Urine: 1.01 (ref 1.005–1.030)
Squamous Epithelial / HPF: NONE SEEN (ref 0–5)
pH: 5 (ref 5.0–8.0)

## 2020-06-01 LAB — BLOOD GAS, ARTERIAL
Acid-Base Excess: 4.3 mmol/L — ABNORMAL HIGH (ref 0.0–2.0)
Bicarbonate: 31.5 mmol/L — ABNORMAL HIGH (ref 20.0–28.0)
O2 Saturation: 87.7 %
Patient temperature: 37
pCO2 arterial: 57 mmHg — ABNORMAL HIGH (ref 32.0–48.0)
pH, Arterial: 7.35 (ref 7.350–7.450)
pO2, Arterial: 57 mmHg — ABNORMAL LOW (ref 83.0–108.0)

## 2020-06-01 LAB — BLOOD GAS, VENOUS
Acid-Base Excess: 6.5 mmol/L — ABNORMAL HIGH (ref 0.0–2.0)
Bicarbonate: 34.2 mmol/L — ABNORMAL HIGH (ref 20.0–28.0)
O2 Saturation: 61.9 %
Patient temperature: 37
pCO2, Ven: 62 mmHg — ABNORMAL HIGH (ref 44.0–60.0)
pH, Ven: 7.35 (ref 7.250–7.430)
pO2, Ven: 34 mmHg (ref 32.0–45.0)

## 2020-06-01 LAB — URINE DRUG SCREEN, QUALITATIVE (ARMC ONLY)
Amphetamines, Ur Screen: NOT DETECTED
Barbiturates, Ur Screen: NOT DETECTED
Benzodiazepine, Ur Scrn: NOT DETECTED
Cannabinoid 50 Ng, Ur ~~LOC~~: NOT DETECTED
Cocaine Metabolite,Ur ~~LOC~~: NOT DETECTED
MDMA (Ecstasy)Ur Screen: NOT DETECTED
Methadone Scn, Ur: NOT DETECTED
Opiate, Ur Screen: NOT DETECTED
Phencyclidine (PCP) Ur S: NOT DETECTED
Tricyclic, Ur Screen: NOT DETECTED

## 2020-06-01 LAB — COMPREHENSIVE METABOLIC PANEL
ALT: 68 U/L — ABNORMAL HIGH (ref 0–44)
AST: 92 U/L — ABNORMAL HIGH (ref 15–41)
Albumin: 1.9 g/dL — ABNORMAL LOW (ref 3.5–5.0)
Alkaline Phosphatase: 74 U/L (ref 38–126)
Anion gap: 6 (ref 5–15)
BUN: 37 mg/dL — ABNORMAL HIGH (ref 8–23)
CO2: 30 mmol/L (ref 22–32)
Calcium: 8.3 mg/dL — ABNORMAL LOW (ref 8.9–10.3)
Chloride: 108 mmol/L (ref 98–111)
Creatinine, Ser: 2.34 mg/dL — ABNORMAL HIGH (ref 0.61–1.24)
GFR, Estimated: 30 mL/min — ABNORMAL LOW (ref >60)
Glucose, Bld: 57 mg/dL — ABNORMAL LOW (ref 70–99)
Potassium: 4.2 mmol/L (ref 3.5–5.1)
Sodium: 144 mmol/L (ref 135–145)
Total Bilirubin: 0.7 mg/dL (ref 0.3–1.2)
Total Protein: 4.9 g/dL — ABNORMAL LOW (ref 6.5–8.1)

## 2020-06-01 LAB — CBC
HCT: 42.9 % (ref 39.0–52.0)
Hemoglobin: 13.2 g/dL (ref 13.0–17.0)
MCH: 29.4 pg (ref 26.0–34.0)
MCHC: 30.8 g/dL (ref 30.0–36.0)
MCV: 95.5 fL (ref 80.0–100.0)
Platelets: 257 10*3/uL (ref 150–400)
RBC: 4.49 MIL/uL (ref 4.22–5.81)
RDW: 14.2 % (ref 11.5–15.5)
WBC: 7.1 10*3/uL (ref 4.0–10.5)
nRBC: 0 % (ref 0.0–0.2)

## 2020-06-01 LAB — GLUCOSE, CAPILLARY
Glucose-Capillary: 147 mg/dL — ABNORMAL HIGH (ref 70–99)
Glucose-Capillary: 148 mg/dL — ABNORMAL HIGH (ref 70–99)

## 2020-06-01 LAB — TROPONIN I (HIGH SENSITIVITY): Troponin I (High Sensitivity): 8 ng/L (ref <18)

## 2020-06-01 LAB — HIV ANTIBODY (ROUTINE TESTING W REFLEX): HIV Screen 4th Generation wRfx: NONREACTIVE

## 2020-06-01 LAB — PROCALCITONIN: Procalcitonin: <0.1 ng/mL

## 2020-06-01 LAB — CBG MONITORING, ED
Glucose-Capillary: 42 mg/dL — CL (ref 70–99)
Glucose-Capillary: 120 mg/dL — ABNORMAL HIGH (ref 70–99)

## 2020-06-01 LAB — MAGNESIUM: Magnesium: 2 mg/dL (ref 1.7–2.4)

## 2020-06-01 LAB — HEMOGLOBIN A1C
Hgb A1c MFr Bld: 7.2 % — ABNORMAL HIGH (ref 4.8–5.6)
Mean Plasma Glucose: 159.94 mg/dL

## 2020-06-01 LAB — ETHANOL: Alcohol, Ethyl (B): <10 mg/dL (ref <10)

## 2020-06-01 MED ORDER — METOPROLOL TARTRATE 50 MG PO TABS
50.0000 mg | ORAL_TABLET | Freq: Two times a day (BID) | ORAL | Status: DC
  Administered 2020-06-01 – 2020-06-02 (×2): 50 mg via ORAL
  Filled 2020-06-01 (×2): qty 1

## 2020-06-01 MED ORDER — ACETAMINOPHEN 650 MG RE SUPP: 650.0000 mg | Freq: Four times a day (QID) | RECTAL | Status: DC | PRN

## 2020-06-01 MED ORDER — INSULIN ASPART 100 UNIT/ML ~~LOC~~ SOLN
0.0000 [IU] | Freq: Three times a day (TID) | SUBCUTANEOUS | Status: DC
  Administered 2020-06-01: 2 [IU] via SUBCUTANEOUS
  Filled 2020-06-01: qty 1

## 2020-06-01 MED ORDER — ASPIRIN EC 81 MG PO TBEC
81.0000 mg | DELAYED_RELEASE_TABLET | Freq: Every day | ORAL | Status: DC
  Administered 2020-06-01 – 2020-06-02 (×2): 81 mg via ORAL
  Filled 2020-06-01 (×2): qty 1

## 2020-06-01 MED ORDER — FUROSEMIDE 10 MG/ML IJ SOLN
40.0000 mg | Freq: Once | INTRAMUSCULAR | Status: AC
  Administered 2020-06-01: 40 mg via INTRAVENOUS
  Filled 2020-06-01: qty 4

## 2020-06-01 MED ORDER — HEPARIN SODIUM (PORCINE) 5000 UNIT/ML IJ SOLN
5000.0000 [IU] | Freq: Three times a day (TID) | INTRAMUSCULAR | Status: DC
  Administered 2020-06-01 – 2020-06-02 (×2): 5000 [IU] via SUBCUTANEOUS
  Filled 2020-06-01 (×3): qty 1

## 2020-06-01 MED ORDER — ACETAMINOPHEN 325 MG PO TABS: 650.0000 mg | ORAL_TABLET | Freq: Four times a day (QID) | ORAL | Status: DC | PRN

## 2020-06-01 MED ORDER — FUROSEMIDE 10 MG/ML IJ SOLN
60.0000 mg | Freq: Two times a day (BID) | INTRAMUSCULAR | Status: DC
  Administered 2020-06-01 – 2020-06-02 (×3): 60 mg via INTRAVENOUS
  Filled 2020-06-01 (×3): qty 8

## 2020-06-01 MED ORDER — DEXTROSE 50 % IV SOLN
INTRAVENOUS | Status: AC
  Filled 2020-06-01: qty 50

## 2020-06-01 NOTE — ED Notes (Addendum)
Pt placed on 5L [place] for O2 sat while laying down

## 2020-06-01 NOTE — Progress Notes (Signed)
PROGRESS NOTE    Victor Hernandez  CNO:[id number] DOB: 23-Aug-1955 DOA: 05/31/2020 PCP: System, Provider Not In   Chief Complain: Altered mental status  Brief Narrative: Patient is a 65 year old male with history of hypertension, diabetes type 2 who presented to the emergency department with confusion.The patient reportedly called local police from his house 3 times earlierto investigate various individuals that the patient reported were trespassing on his property.  He reportedly was actively seeing individuals ambulating around his property at the time that police were present, although they were reportedly unable to see the individuals that the patient was reportedly visualizing.  On presentation, his blood pressure was in the range of 200s/100s.  Patient was confused.  He was admitted for hypertensive emergency and encephalopathy secondary to severe hypertension.  Also noted to be have severe fluid overload on presentation with elevated BNP.  Assessment & Plan:   Principal Problem:   Acute encephalopathy Active Problems:   Diabetes mellitus without complication (HCC)   Elevated serum creatinine   (HFpEF) heart failure with preserved ejection fraction ([place])   Hypertensive crisis   Acute encephalopathy: Most likely from hypertensive encephalopathy..  He was still confused during my evaluation this morning.  CT head and MRI of the brain did not show any acute intracranial normalities.  Continue monitor mental status.  Check UDS, alcohol level.  ABG did not significant hypercarbia.  Hypertensive emergency: Presented with severe hypertension with mental status changes.  UDS pending.  He was given IV labetalol in the emergency department.  Blood pressure creeping up again.  Continue current medication: Amlodipine, metoprolol.  Continue as needed medication for severe hypertension He was on losartan, Toprol and amlodipine at home  Acute hypoxic respiratory failure: Saturating 80% on room air on  presentation.  Improved saturation on 2 L of oxygen per minute. CT chest showed bilateral pleural effusions, cardiomegaly.  Patient appears volume overloaded.  Continue supplemental oxygen as needed, continue diuresis.  Try to wean the oxygen. No evidence of infiltrate or pneumonia on CT chest, no evidence of pneumothorax or pulmonary embolism.  Elevated BNP/volume overload/anasarca: Elevated BNP, severe peripheral edema.  Continue diuresis.  Will check echocardiogram to rule out heart failure. He also has severe deficiency in the albumin which would have contributed to anasarca.  Renal insufficiency: Presented with creatinine of 2.6.  No recent kidney function wasavailable on the system.  Ultrasound of the kidney is consistent with chronic renal disease.  He was also taking Lasix, ARB at home.  ARB on hold.  He needs to establish outpatient follow-up with nephrology.  We will continue diuresis expecting improvement in the renal function.  Continue monitor input/output.  If kidney function does not improve or  starts to get worse, will request for nephrology evaluation.  Type 2 diabetes mellitus: On insulin at home.  Hemoglobin A1c in the range of 7.  Continue current insulin regimen.  He had an hypoglycemic episode this morning.  Currently blood sugar stable  Severe protein calorie malnutrition.  Albumin of just 1.9.Nutrition consulted  Transaminitis: Mild.  Will check hepatitis panel.  HLD: On Lipitor at home.             DVT prophylaxis:Heparin [place] Code Status: Full Family Communication: called sister on file, she states she does not know anything about her brother,she is not in touch Status is: Inpatient  Remains inpatient appropriate because:Inpatient level of care appropriate due to severity of illness   Dispo: The patient is from: Home  Anticipated d/c is to: Home              Anticipated d/c date is: 3 days              Patient currently is not medically stable  to d/c.    Consultants: None  Procedures:None  Antimicrobials:  Anti-infectives (From admission, onward)   None      Subjective: Patient seen and examined at the bedside this morning.  His blood pressure was actually stable during my evaluation.  He was sitting in the chair, attempting to eat his breakfast.  He was confused and did not valuable information to me.  His sugars were checked while I was there and it was low.  Not in respiratory distress  Objective: Vitals:   06/01/20 1000 06/01/20 1126 06/01/20 1200 06/01/20 1446  BP: 123/81 (!) 142/105 (!) 155/76 (!) 173/109  Pulse: (!) 102 76 66 (!) 106  Resp:  16 16 19   Temp:    97.7 F (36.5 C)  TempSrc:    Oral  SpO2: 96% 97% 94% 99%  Weight:      Height:       No intake or output data in the 24 hours ending 06/01/20 1556 Filed Weights   05/31/20 2027  Weight: 108.9 kg    Examination:  General exam: Morbidly obese Respiratory system: Bilateral diminished air sounds but  no wheezes or crackles  Cardiovascular system: S1 & S2 heard, RRR. No JVD, murmurs, rubs, gallops or clicks.  Gastrointestinal system: Abdomen is nondistended, soft and nontender. No organomegaly or masses felt. Normal bowel sounds heard. Central nervous system: Alert and awake but not oriented Extremities: Anasarca, no clubbing ,no cyanosis Skin: No rashes, lesions or ulcers,no icterus ,no pallor  Data Reviewed: I have personally reviewed following labs and imaging studies  CBC: Recent Labs  Lab 05/31/20 2030 06/01/20 0503  WBC 8.8 7.1  HGB 14.0 13.2  HCT 45.2 42.9  MCV 95.4 95.5  PLT 302 546   Basic Metabolic Panel: Recent Labs  Lab 05/31/20 2030 06/01/20 0503  NA 147* 144  K 4.2 4.2  CL 106 108  CO2 31 30  GLUCOSE 75 57*  BUN 38* 37*  CREATININE 2.67* 2.34*  CALCIUM 8.5* 8.3*  MG 2.1 2.0   GFR: Estimated Creatinine Clearance: 35.4 mL/min (A) (by C-G formula based on SCr of 2.34 mg/dL (H)). Liver Function Tests: Recent  Labs  Lab 05/31/20 2030 06/01/20 0503  AST 110* 92*  ALT 81* 68*  ALKPHOS 88 74  BILITOT 0.9 0.7  PROT 5.6* 4.9*  ALBUMIN 2.3* 1.9*   No results for input(s): LIPASE, AMYLASE in the last 168 hours. Recent Labs  Lab 05/31/20 2123  AMMONIA <9*   Coagulation Profile: No results for input(s): INR, PROTIME in the last 168 hours. Cardiac Enzymes: No results for input(s): CKTOTAL, CKMB, CKMBINDEX, TROPONINI in the last 168 hours. BNP (last 3 results) No results for input(s): PROBNP in the last 8760 hours. HbA1C: Recent Labs    06/01/20 0503  HGBA1C 7.2*   CBG: Recent Labs  Lab 06/01/20 1056 06/01/20 1136  GLUCAP 42* 120*   Lipid Profile: No results for input(s): CHOL, HDL, LDLCALC, TRIG, CHOLHDL, LDLDIRECT in the last 72 hours. Thyroid Function Tests: Recent Labs    05/31/20 2030  TSH 3.406   Anemia Panel: No results for input(s): VITAMINB12, FOLATE, FERRITIN, TIBC, IRON, RETICCTPCT in the last 72 hours. Sepsis Labs: Recent Labs  Lab 05/31/20 0015  PROCALCITON <0.10  Recent Results (from the past 240 hour(s))  Resp Panel by RT-PCR (Flu A&B, Covid) Nasopharyngeal Swab     Status: None   Collection Time: 05/31/20  9:08 PM   Specimen: Nasopharyngeal Swab; Nasopharyngeal(NP) swabs in vial transport medium  Result Value Ref Range Status   SARS Coronavirus 2 by RT PCR NEGATIVE NEGATIVE Final    Comment: (NOTE) SARS-CoV-2 target nucleic acids are NOT DETECTED.  The SARS-CoV-2 RNA is generally detectable in upper respiratory specimens during the acute phase of infection. The lowest concentration of SARS-CoV-2 viral copies this assay can detect is 138 copies/mL. A negative result does not preclude SARS-Cov-2 infection and should not be used as the sole basis for treatment or other patient management decisions. A negative result may occur with  improper specimen collection/handling, submission of specimen other than nasopharyngeal swab, presence of viral  mutation(s) within the areas targeted by this assay, and inadequate number of viral copies(<138 copies/mL). A negative result must be combined with clinical observations, patient history, and epidemiological information. The expected result is Negative.  Fact Sheet for Patients:  EntrepreneurPulse.com.au  Fact Sheet for Healthcare Providers:  IncredibleEmployment.be  This test is no t yet approved or cleared by the [place] FDA and  has been authorized for detection and/or diagnosis of SARS-CoV-2 by FDA under an Emergency Use Authorization (EUA). This EUA will remain  in effect (meaning this test can be used) for the duration of the COVID-19 declaration under Section 564(b)(1) of the Act, 21 U.S.C.section 360bbb-3(b)(1), unless the authorization is terminated  or revoked sooner.       Influenza A by PCR NEGATIVE NEGATIVE Final   Influenza B by PCR NEGATIVE NEGATIVE Final    Comment: (NOTE) The Xpert Xpress SARS-CoV-2/FLU/RSV plus assay is intended as an aid in the diagnosis of influenza from Nasopharyngeal swab specimens and should not be used as a sole basis for treatment. Nasal washings and aspirates are unacceptable for Xpert Xpress SARS-CoV-2/FLU/RSV testing.  Fact Sheet for Patients: EntrepreneurPulse.com.au  Fact Sheet for Healthcare Providers: IncredibleEmployment.be  This test is not yet approved or cleared by the [place] FDA and has been authorized for detection and/or diagnosis of SARS-CoV-2 by FDA under an Emergency Use Authorization (EUA). This EUA will remain in effect (meaning this test can be used) for the duration of the COVID-19 declaration under Section 564(b)(1) of the Act, 21 U.S.C. section 360bbb-3(b)(1), unless the authorization is terminated or revoked.  Performed at [facility], [address], [place], [place] [address]          Radiology  Studies: DG Chest 2 View  Result Date: 05/31/2020 CLINICAL DATA:  Tachycardia EXAM: CHEST - 2 VIEW COMPARISON:  None. FINDINGS: Lung volumes are small but are symmetric. There is mild focal pulmonary infiltrate within the left lower lobe, possibly infectious in the acute setting. Small associated pleural effusion is present. No pneumothorax. Right lung is clear. No pleural effusion on the right. Cardiac size within normal limits. Pulmonary vascularity is normal. No acute bone abnormality. IMPRESSION: Focal infiltrate within the left lung base, possibly infectious in etiology with associated small parapneumonic effusion. Electronically Signed   By: Fidela Salisbury MD   On: 05/31/2020 22:19   CT Head Wo Contrast  Result Date: 05/31/2020 CLINICAL DATA:  Altered mental status, visual hallucinations EXAM: CT HEAD WITHOUT CONTRAST TECHNIQUE: Contiguous axial images were obtained from the base of the skull through the vertex without intravenous contrast. COMPARISON:  None. FINDINGS: Brain: Normal anatomic configuration. Parenchymal  volume loss is commensurate with the patient's age. Mild periventricular white matter changes are present likely reflecting the sequela of small vessel ischemia. No abnormal intra or extra-axial mass lesion or fluid collection. No abnormal mass effect or midline shift. No evidence of acute intracranial hemorrhage or infarct. Ventricular size is normal. Cerebellum unremarkable. Vascular: No asymmetric hyperdense vasculature at the skull base. Skull: Intact Sinuses/Orbits: Paranasal sinuses are clear. Orbits are unremarkable. Other: Mastoid air cells and middle ear cavities are clear. IMPRESSION: No acute intracranial abnormality.  Mild senescent changes. Electronically Signed   By: Fidela Salisbury MD   On: 05/31/2020 22:23   CT Chest Wo Contrast  Result Date: 05/31/2020 CLINICAL DATA:  Tachycardia, left basilar pneumonia EXAM: CT CHEST WITHOUT CONTRAST TECHNIQUE: Multidetector CT imaging of  the chest was performed following the standard protocol without IV contrast. COMPARISON:  05/31/2020 FINDINGS: Cardiovascular: Heart is enlarged with trace pericardial fluid. Extensive atherosclerosis throughout the coronary vasculature. Evaluation of the vessels is limited without IV contrast. Normal caliber of the thoracic aorta. Mediastinum/Nodes: No enlarged mediastinal or axillary lymph nodes. Thyroid gland, trachea, and esophagus demonstrate no significant findings. Lungs/Pleura: Bilateral pleural effusions estimated less than 500 cc each. Dependent atelectasis within the lower lobes. No acute airspace disease or pneumothorax. Central airways are patent. Upper Abdomen: There is mild diffuse body wall edema, greatest in the bilateral flanks. No acute upper abdominal findings. Musculoskeletal: No acute or destructive bony lesions. Reconstructed images demonstrate no additional findings. IMPRESSION: 1. Bilateral pleural effusions, volume estimated less than 500 cc each. 2. Cardiomegaly, with trace pericardial effusion. 3. Diffuse body wall edema, greatest in the bilateral flanks. 4. Extensive coronary artery atherosclerosis. Electronically Signed   By: Randa Ngo M.D.   On: 05/31/2020 23:41   MR BRAIN WO CONTRAST  Result Date: 06/01/2020 CLINICAL DATA:  Delirium EXAM: MRI HEAD WITHOUT CONTRAST TECHNIQUE: Multiplanar, multiecho pulse sequences of the brain and surrounding structures were obtained without intravenous contrast. COMPARISON:  CT head 05/31/2020 FINDINGS: Brain: Mild atrophy. Negative for hydrocephalus. Moderate white matter changes with patchy periventricular deep white matter hyperintensity bilaterally. Small hyperintensity left paracentral pons. No mass lesion. Chronic microhemorrhage in the right parietal lobe. Mild dural thickening in the right frontal parietal lobe best seen on FLAIR and diffusion imaging. No significant fluid collection or blood products. Vascular: Normal arterial flow  voids. Skull and upper cervical spine: No focal skeletal lesion. Sinuses/Orbits: Sinus mucosal edema. No orbital mass. Right cataract extraction Other: None IMPRESSION: Moderate chronic microvascular ischemic change.  No acute infarct. Mild dural thickening in the right frontal parietal lobe without fluid collection. 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 Correlate with history. Chronic microhemorrhage in the right parietal lobe. Correlate with hypertension history. Electronically Signed   By: Franchot Gallo M.D.   On: 06/01/2020 13:19   US Renal  Result Date: 05/31/2020 CLINICAL DATA:  Acute renal insufficiency EXAM: RENAL / URINARY TRACT ULTRASOUND COMPLETE COMPARISON:  None. FINDINGS: Right Kidney: Renal measurements: 12.1 x 6.1 x 5.7 cm = volume: 219 mL. Renal cortical thickness is preserved, however, cortical echogenicity is diffusely increased suggesting changes of underlying medical renal disease. No hydronephrosis. No intrarenal masses or calcifications are seen. Multiple simple cortical cysts are identified measuring up to 1.5 cm. Left Kidney: Renal measurements: 11.7 x 6.6 x 6.1 cm = volume: 248 mL. Renal cortical thickness is preserved, however, cortical echogenicity is diffusely increased suggesting changes of underlying medical renal disease. No hydronephrosis. No intrarenal masses or calcifications are seen. Single parapelvic cyst is  seen within the lower pole measuring up to 2.8 cm. Bladder: Appears normal for degree of bladder distention. Bilateral ureteral jets are identified. Other: Small left pleural effusion is present IMPRESSION: Diffusely increased renal cortical echogenicity in keeping with changes of undermining medical renal disease. No hydronephrosis. Preserved cortical thickness. Small left pleural effusion. Electronically Signed   By: Fidela Salisbury MD   On: 05/31/2020 23:39        Scheduled Meds: . amLODipine  10 mg Oral Daily  . aspirin EC  81 mg Oral Daily  . furosemide  60  mg Intravenous Q12H  . insulin aspart  0-9 Units Subcutaneous TID WC   Continuous Infusions:   LOS: 1 day    Time spent: 35 mins.More than 50% of that time was spent in counseling and/or coordination of care.      Shelly Coss, MD [facility] P1/08/2020, 3:56 PM

## 2020-06-01 NOTE — ED Notes (Signed)
Patient transported to MRI 

## 2020-06-01 NOTE — ED Notes (Addendum)
MD notified of pt CBG. Pt given amp dextrose. RT called for ABG. Pt currently eating meal tray

## 2020-06-01 NOTE — ED Notes (Signed)
Pt to rm 05 with imaging tech. Pt placed on monitor, trop drawn and sent

## 2020-06-01 NOTE — ED Notes (Signed)
PT witnesses attempting to get dressed. Pt reminded that they are in the hospital. Pt assisted to recliner and given coffee and tv turned on. Pt denies any needs at this time.

## 2020-06-01 NOTE — ED Notes (Signed)
Pt placed on 2L [place] for o2 sat in chair.

## 2020-06-01 NOTE — ED Notes (Addendum)
Pt meal tray sat at bedside. Pt remains in recliner resting with eyes closed. Pt can be visualized from nurses station. Will administer medication when pt's awake.

## 2020-06-01 NOTE — ED Notes (Signed)
Rn into pt room for o2 sat 74%. Pt sleeping with [place] removed. PT woken up and informed to place [place] back on. Pt resting in bed, 6L [place]. NO needs at this time.

## 2020-06-01 NOTE — ED Notes (Signed)
Pt given ginger ale to drink. 

## 2020-06-01 NOTE — ED Notes (Signed)
Patient sitting up in recliner, whenever staff leave the room patient pulls off cardiac monitoring, patient calm and cooperative and redirectable. Curtains opened and patient is able to be visualized from nursing station.

## 2020-06-01 NOTE — ED Notes (Signed)
Pt getting out of bed "I need to go home. The cops are coming back for me to take me home. They think I am crazy because I saw a hippie guy painting the pole outside my house." Pt redirected back to bed.

## 2020-06-01 NOTE — Progress Notes (Signed)
Brief note regarding plan, with full H&P to follow:  65 year old with history of hypertension, who is admitted with acute encephalopathy associated with visual hallucinations in the context of suspected hypertensive crisis.  As needed IV labetalol ordered for goal systolic blood pressure less than 170, representing 15% reduction in presenting systolic blood pressure.    Babs Bertin, DO Hospitalist

## 2020-06-02 DIAGNOSIS — N184 Chronic kidney disease, stage 4 (severe): Secondary | ICD-10-CM | POA: Diagnosis not present

## 2020-06-02 DIAGNOSIS — E1122 Type 2 diabetes mellitus with diabetic chronic kidney disease: Secondary | ICD-10-CM | POA: Diagnosis not present

## 2020-06-02 DIAGNOSIS — I5033 Acute on chronic diastolic (congestive) heart failure: Secondary | ICD-10-CM | POA: Diagnosis not present

## 2020-06-02 DIAGNOSIS — G9340 Encephalopathy, unspecified: Secondary | ICD-10-CM | POA: Diagnosis not present

## 2020-06-02 DIAGNOSIS — Z794 Long term (current) use of insulin: Secondary | ICD-10-CM

## 2020-06-02 LAB — CBC WITH DIFFERENTIAL/PLATELET
Abs Immature Granulocytes: 0.01 10*3/uL (ref 0.00–0.07)
Basophils Absolute: 0 10*3/uL (ref 0.0–0.1)
Basophils Relative: 0 %
Eosinophils Absolute: 0.4 10*3/uL (ref 0.0–0.5)
Eosinophils Relative: 6 %
HCT: 44.7 % (ref 39.0–52.0)
Hemoglobin: 13.3 g/dL (ref 13.0–17.0)
Immature Granulocytes: 0 %
Lymphocytes Relative: 25 %
Lymphs Abs: 1.8 10*3/uL (ref 0.7–4.0)
MCH: 29.1 pg (ref 26.0–34.0)
MCHC: 29.8 g/dL — ABNORMAL LOW (ref 30.0–36.0)
MCV: 97.8 fL (ref 80.0–100.0)
Monocytes Absolute: 0.9 10*3/uL (ref 0.1–1.0)
Monocytes Relative: 13 %
Neutro Abs: 3.8 10*3/uL (ref 1.7–7.7)
Neutrophils Relative %: 56 %
Platelets: 267 10*3/uL (ref 150–400)
RBC: 4.57 MIL/uL (ref 4.22–5.81)
RDW: 14.3 % (ref 11.5–15.5)
WBC: 6.9 10*3/uL (ref 4.0–10.5)
nRBC: 0 % (ref 0.0–0.2)

## 2020-06-02 LAB — ECHOCARDIOGRAM COMPLETE
Area-P 1/2: 3.37 cm2
Height: 65 in
S' Lateral: 3.38 cm
Weight: 3840 oz

## 2020-06-02 LAB — GLUCOSE, CAPILLARY
Glucose-Capillary: 36 mg/dL — CL (ref 70–99)
Glucose-Capillary: 41 mg/dL — CL (ref 70–99)
Glucose-Capillary: 102 mg/dL — ABNORMAL HIGH (ref 70–99)
Glucose-Capillary: 137 mg/dL — ABNORMAL HIGH (ref 70–99)

## 2020-06-02 LAB — HEPATITIS PANEL, ACUTE
HCV Ab: NONREACTIVE
Hep A IgM: NONREACTIVE
Hep B C IgM: NONREACTIVE
Hepatitis B Surface Ag: NONREACTIVE

## 2020-06-02 LAB — BASIC METABOLIC PANEL
Anion gap: 6 (ref 5–15)
BUN: 37 mg/dL — ABNORMAL HIGH (ref 8–23)
CO2: 34 mmol/L — ABNORMAL HIGH (ref 22–32)
Calcium: 8.5 mg/dL — ABNORMAL LOW (ref 8.9–10.3)
Chloride: 106 mmol/L (ref 98–111)
Creatinine, Ser: 2.48 mg/dL — ABNORMAL HIGH (ref 0.61–1.24)
GFR, Estimated: 28 mL/min — ABNORMAL LOW (ref >60)
Glucose, Bld: 53 mg/dL — ABNORMAL LOW (ref 70–99)
Potassium: 4.4 mmol/L (ref 3.5–5.1)
Sodium: 146 mmol/L — ABNORMAL HIGH (ref 135–145)

## 2020-06-02 MED ORDER — LOSARTAN POTASSIUM 100 MG PO TABS: 100.0000 mg | ORAL_TABLET | Freq: Every day | ORAL | 0 refills | Status: DC

## 2020-06-02 MED ORDER — FUROSEMIDE 40 MG PO TABS: 40.0000 mg | ORAL_TABLET | Freq: Every day | ORAL | Status: DC

## 2020-06-02 MED ORDER — AMLODIPINE BESYLATE 10 MG PO TABS: 10.0000 mg | ORAL_TABLET | Freq: Every day | ORAL | 0 refills | Status: DC

## 2020-06-02 MED ORDER — ENSURE MAX PROTEIN PO LIQD
11.0000 [oz_av] | Freq: Two times a day (BID) | ORAL | Status: DC
  Administered 2020-06-02: 11 [oz_av] via ORAL
  Filled 2020-06-02: qty 330

## 2020-06-02 MED ORDER — METOPROLOL TARTRATE 50 MG PO TABS: 50.0000 mg | ORAL_TABLET | Freq: Two times a day (BID) | ORAL | 0 refills | Status: DC

## 2020-06-02 MED ORDER — BLOOD GLUCOSE METER KIT: PACK | 0 refills | Status: AC

## 2020-06-02 MED ORDER — LIVING WELL WITH DIABETES BOOK
Freq: Once | Status: AC
  Filled 2020-06-02: qty 1

## 2020-06-02 MED ORDER — FUROSEMIDE 40 MG PO TABS: 40.0000 mg | ORAL_TABLET | Freq: Every day | ORAL | 0 refills | Status: DC

## 2020-06-02 MED ORDER — ENSURE ENLIVE PO LIQD
237.0000 mL | Freq: Two times a day (BID) | ORAL | Status: DC
  Administered 2020-06-02: 237 mL via ORAL

## 2020-06-02 MED ORDER — KLOR-CON M20 20 MEQ PO TBCR: 20.0000 meq | EXTENDED_RELEASE_TABLET | Freq: Every day | ORAL | Status: DC

## 2020-06-02 NOTE — Progress Notes (Signed)
Victor Hernandez to be D/C'd Home per MD order.  Discussed prescriptions and follow up appointments with the patient. Prescriptions given to patient, medication list explained in detail. Pt verbalized understanding.  Allergies as of 06/02/2020   No Known Allergies     Medication List    STOP taking these medications   amLODIPine-Valsartan-HCTZ 10-320-25 MG Tabs   HumaLOG Mix 75/25 KwikPen (75-25) 100 UNIT/ML Kwikpen Generic drug: Insulin Lispro Prot & Lispro   KLOR-CON PO   lovastatin 20 MG tablet Commonly known as: MEVACOR   pioglitazone 45 MG tablet Commonly known as: ACTOS   polyethylene glycol powder 17 GM/SCOOP powder Commonly known as: GLYCOLAX/MIRALAX     TAKE these medications   amLODipine 10 MG tablet Commonly known as: NORVASC Take 1 tablet (10 mg total) by mouth daily. Start taking on: June 03, 2020   aspirin EC 81 MG tablet Take 81 mg by mouth daily.   atorvastatin 40 MG tablet Commonly known as: LIPITOR Take 40 mg by mouth at bedtime.   blood glucose meter kit and supplies Dispense based on patient and insurance preference. Use up to four times daily as directed. (FOR ICD-10 E10.9, E11.9).   furosemide 40 MG tablet Commonly known as: LASIX Take 1 tablet (40 mg total) by mouth daily.   Klor-Con M20 20 MEQ tablet Generic drug: potassium chloride SA Take 1 tablet (20 mEq total) by mouth daily. What changed: when to take this   losartan 100 MG tablet Commonly known as: COZAAR Take 1 tablet (100 mg total) by mouth daily.   metoprolol tartrate 50 MG tablet Commonly known as: LOPRESSOR Take 1 tablet (50 mg total) by mouth 2 (two) times daily.       Vitals:   06/02/20 0755 06/02/20 1203  BP: (!) 149/100 (!) 151/99  Pulse: (!) 105 (!) 105  Resp: 16 16  Temp: 97.7 F (36.5 C) 98.3 F (36.8 C)  SpO2: 100% 97%    Skin clean, dry and intact without evidence of skin break down, no evidence of skin tears noted. IV catheter discontinued intact. Site  without signs and symptoms of complications. Dressing and pressure applied. Pt denies pain at this time. No complaints noted.  An After Visit Summary was printed and given to the patient. Patient escorted via WC, and D/C home via private auto.   D , RN   

## 2020-06-02 NOTE — Plan of Care (Signed)
  Problem: Clinical Measurements: Goal: Cardiovascular complication will be avoided Outcome: Progressing   Problem: Nutrition: Goal: Adequate nutrition will be maintained Outcome: Progressing   Problem: Safety: Goal: Ability to remain free from injury will improve Outcome: Progressing   

## 2020-06-02 NOTE — Discharge Summary (Signed)
Physician Discharge Summary  Victor Hernandez [id number] DOB: [birth year] DOA: 05/31/2020  PCP: Remi Haggard, FNP  Admit date: 05/31/2020 Discharge date: 06/02/2020  Discharge disposition: Home with home health RN   Recommendations for Outpatient Follow-Up:   Follow-up with PCP Erasmo Downer, FNP, on Monday, 06/07/2020 as scheduled. Follow-up with the CHF clinic as scheduled   Discharge Diagnosis:   Principal Problem:   Acute encephalopathy Active Problems:   Type 2 diabetes mellitus with stage 4 chronic kidney disease (HCC)   Elevated serum creatinine   (HFpEF) heart failure with preserved ejection fraction ([place])   Hypertensive crisis    Discharge Condition: Stable.  Diet recommendation:  Diet Order            Diet - low sodium heart healthy           Diet Carb Modified           Diet regular Room service appropriate? Yes; Fluid consistency: Thin  Diet effective now                   Code Status: Full Code     Hospital Course:   Victor Hernandez is a 65 year old man with medical history significant for hypertension, type II DM, likely CKD stage IV, morbid obesity, who was brought to the hospital because of acute change in mental status (acute confusional state).  Reportedly, patient had called the local police to come to his house to investigate individuals that he reported were trespassing on his property.  Apparently, even though the police came by on 3 separate occasions, they did not find any body on his property.  It was believed that he was having visual hallucinations so he was brought to the hospital for further management.   Initial blood pressure in the ED was 200/136.  He was admitted to the hospital for hypertensive encephalopathy.  He was also found to have acute diastolic CHF.  He likely has chronic diastolic CHF at baseline.  He was treated with antihypertensives and IV Lasix.  He said he had significant bilateral lower extremity swelling but this  has improved.  He also had acute hypoxic respiratory failure (requiring oxygen via nasal cannula) likely due to CHF exacerbation but this has resolved.  He has longstanding type 2 diabetes mellitus and he takes Humalog mix 75/25 40 units daily at home.  Hospital course was complicated by multiple hypoglycemic episodes even without any insulin therapy.  Patient reported that he has been havng "shaking" episodes at home that sometimes improves after eating peanut butter.  Hemoglobin A1c was 7.2.  Because of concern for recurrent hypoglycemia at home, he was advised to avoid insulin therapy until further evaluation with her PCP as an outpatient.  He said he did not have a functional glucometer so a new prescription for glucometer was provided and he was advised to get the glucometer on the same day of discharge.  I personally educated the patient on the management of diabetes including signs and symptoms of hypoglycemia and hyperglycemia.  Diabetes educator was also consulted to provide additional education regarding management of diabetes.  His condition has improved.  His mental status is back to baseline.  He feels well and he wants to go home today. He is deemed stable for discharge to home.      Discharge Exam:    Vitals:   06/02/20 0432 06/02/20 0500 06/02/20 0755 06/02/20 1203  BP: (!) 155/97  (!) 149/100 (!) 151/99  Pulse:  99  (!) 105 (!) 105  Resp: _0 Temp: 97.7 F (36.5 C)  97.7 F (36.5 C) 98.3 F (36.8 C)  TempSrc: Oral  Oral Oral  SpO2: 100%  100% 97%  Weight:  114.8 kg    Height:         GEN: NAD SKIN: Superficial wound on anterior aspect of right leg. EYES: EOMI ENT: MMM CV: RRR PULM: CTA B ABD: soft, obese, NT, +BS CNS: AAO x 3, non focal EXT: Trace b/l leg edema. No tenderness   The results of significant diagnostics from this hospitalization (including imaging, microbiology, ancillary and laboratory) are listed below for reference.     Procedures and  Diagnostic Studies:   MR BRAIN WO CONTRAST  Result Date: 06/01/2020 CLINICAL DATA:  Delirium EXAM: MRI HEAD WITHOUT CONTRAST TECHNIQUE: Multiplanar, multiecho pulse sequences of the brain and surrounding structures were obtained without intravenous contrast. COMPARISON:  CT head 05/31/2020 FINDINGS: Brain: Mild atrophy. Negative for hydrocephalus. Moderate white matter changes with patchy periventricular deep white matter hyperintensity bilaterally. Small hyperintensity left paracentral pons. No mass lesion. Chronic microhemorrhage in the right parietal lobe. Mild dural thickening in the right frontal parietal lobe best seen on FLAIR and diffusion imaging. No significant fluid collection or blood products. Vascular: Normal arterial flow voids. Skull and upper cervical spine: No focal skeletal lesion. Sinuses/Orbits: Sinus mucosal edema. No orbital mass. Right cataract extraction Other: None IMPRESSION: Moderate chronic microvascular ischemic change.  No acute infarct. Mild dural thickening in the right frontal parietal lobe without fluid collection. 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 Correlate with history. Chronic microhemorrhage in the right parietal lobe. Correlate with hypertension history. Electronically Signed   By: Franchot Gallo M.D.   On: 06/01/2020 13:19   ECHOCARDIOGRAM COMPLETE  Result Date: 06/02/2020    ECHOCARDIOGRAM REPORT   Patient Name:   Victor Hernandez Date of Exam: 06/01/2020 Medical Rec #:  [id number]    Height:       65.0 in Accession #:    [id number]   Weight:       240.0 lb Date of Birth:  09/04/1955    BSA:          2.138 m Patient Age:    45 years     BP:           140/114 mmHg Patient Gender: M            HR:           74 bpm. Exam Location:  [facility] Procedure: 2D Echo, Cardiac Doppler and Color Doppler Indications:     F75.88 Acute Diastolic CHF  History:         Patient has no prior history of Echocardiogram examinations.                  Risk Factors:Hypertension, Diabetes and  Dyslipidemia.  Sonographer:     Wilford Sports Rodgers-Jones Referring Phys:  [id number] AMRIT ADHIKARI Diagnosing Phys: Serafina Royals MD IMPRESSIONS  1. Left ventricular ejection fraction, by estimation, is 50 to 55%. The left ventricle has low normal function. The left ventricle has no regional wall motion abnormalities. There is moderate left ventricular hypertrophy. Left ventricular diastolic parameters were normal.  2. Right ventricular systolic function is normal. The right ventricular size is normal.  3. Left atrial size was moderately dilated.  4. The mitral valve is abnormal. Moderate to severe mitral valve regurgitation.  5. The aortic valve is normal in structure. Aortic valve  regurgitation is not visualized. FINDINGS  Left Ventricle: Left ventricular ejection fraction, by estimation, is 50 to 55%. The left ventricle has low normal function. The left ventricle has no regional wall motion abnormalities. The left ventricular internal cavity size was normal in size. There is moderate left ventricular hypertrophy. Left ventricular diastolic parameters were normal. Right Ventricle: The right ventricular size is normal. No increase in right ventricular wall thickness. Right ventricular systolic function is normal. Left Atrium: Left atrial size was moderately dilated. Right Atrium: Right atrial size was normal in size. Pericardium: There is no evidence of pericardial effusion. Mitral Valve: The mitral valve is abnormal. Moderate to severe mitral valve regurgitation. Tricuspid Valve: The tricuspid valve is normal in structure. Tricuspid valve regurgitation is mild. Aortic Valve: The aortic valve is normal in structure. Aortic valve regurgitation is not visualized. Pulmonic Valve: The pulmonic valve was normal in structure. Pulmonic valve regurgitation is not visualized. Aorta: The aortic root and ascending aorta are structurally normal, with no evidence of dilitation. IAS/Shunts: No atrial level shunt detected by color  flow Doppler.  LEFT VENTRICLE PLAX 2D LVIDd:         4.79 cm  Diastology LVIDs:         3.38 cm  LV e' medial:    5.00 cm/s LV PW:         1.59 cm  LV E/e' medial:  30.2 LV IVS:        1.59 cm  LV e' lateral:   8.16 cm/s LVOT diam:     2.30 cm  LV E/e' lateral: 18.5 LV SV:         73 LV SV Index:   34 LVOT Area:     4.15 cm  RIGHT VENTRICLE             IVC RV Basal diam:  5.04 cm     IVC diam: 2.11 cm RV S prime:     19.70 cm/s TAPSE (M-mode): 2.2 cm LEFT ATRIUM              Index       RIGHT ATRIUM           Index LA diam:        6.40 cm  2.99 cm/m  RA Area:     21.50 cm LA Vol (A2C):   103.0 ml 48.19 ml/m RA Volume:   67.90 ml  31.77 ml/m LA Vol (A4C):   140.0 ml 65.50 ml/m LA Biplane Vol: 123.0 ml 57.54 ml/m  AORTIC VALVE LVOT Vmax:   99.50 cm/s LVOT Vmean:  67.200 cm/s LVOT VTI:    0.176 m  AORTA Ao Root diam: 3.60 cm MITRAL VALVE                TRICUSPID VALVE MV Area (PHT): 3.37 cm     TR Peak grad:   30.7 mmHg MV Decel Time: 225 msec     TR Vmax:        277.00 cm/s MV E velocity: 151.00 cm/s MV A velocity: 82.40 cm/s   SHUNTS MV E/A ratio:  1.83         Systemic VTI:  0.18 m                             Systemic Diam: 2.30 cm Serafina Royals MD Electronically signed by Serafina Royals MD Signature Date/Time: 06/02/2020/11:26:31 AM    Final  Labs:   Basic Metabolic Panel: Recent Labs  Lab 05/31/20 2030 06/01/20 0503 06/02/20 0653  NA 147* 144 146*  K 4.2 4.2 4.4  CL 106 108 106  CO2 31 30 34*  GLUCOSE 75 57* 53*  BUN 38* 37* 37*  CREATININE 2.67* 2.34* 2.48*  CALCIUM 8.5* 8.3* 8.5*  MG 2.1 2.0  --    GFR Estimated Creatinine Clearance: 34.3 mL/min (A) (by C-G formula based on SCr of 2.48 mg/dL (H)). Liver Function Tests: Recent Labs  Lab 05/31/20 2030 06/01/20 0503  AST 110* 92*  ALT 81* 68*  ALKPHOS 88 74  BILITOT 0.9 0.7  PROT 5.6* 4.9*  ALBUMIN 2.3* 1.9*   No results for input(s): LIPASE, AMYLASE in the last 168 hours. Recent Labs  Lab 05/31/20 2123  AMMONIA  <9*   Coagulation profile No results for input(s): INR, PROTIME in the last 168 hours.  CBC: Recent Labs  Lab 05/31/20 2030 06/01/20 0503 06/02/20 0653  WBC 8.8 7.1 6.9  NEUTROABS  --   --  3.8  HGB 14.0 13.2 13.3  HCT 45.2 42.9 44.7  MCV 95.4 95.5 97.8  PLT 302 257 267   Cardiac Enzymes: No results for input(s): CKTOTAL, CKMB, CKMBINDEX, TROPONINI in the last 168 hours. BNP: Invalid input(s): POCBNP CBG: Recent Labs  Lab 06/02/20 0730 06/02/20 0732 06/02/20 0746 06/02/20 0854 06/02/20 1201  GLUCAP 37* 36* 41* 102* 137*   D-Dimer No results for input(s): DDIMER in the last 72 hours. Hgb A1c Recent Labs    06/01/20 0503  HGBA1C 7.2*   Lipid Profile No results for input(s): CHOL, HDL, LDLCALC, TRIG, CHOLHDL, LDLDIRECT in the last 72 hours. Thyroid function studies Recent Labs    05/31/20 2030  TSH 3.406   Anemia work up No results for input(s): VITAMINB12, FOLATE, FERRITIN, TIBC, IRON, RETICCTPCT in the last 72 hours. Microbiology Recent Results (from the past 240 hour(s))  Resp Panel by RT-PCR (Flu A&B, Covid) Nasopharyngeal Swab     Status: None   Collection Time: 05/31/20  9:08 PM   Specimen: Nasopharyngeal Swab; Nasopharyngeal(NP) swabs in vial transport medium  Result Value Ref Range Status   SARS Coronavirus 2 by RT PCR NEGATIVE NEGATIVE Final    Comment: (NOTE) SARS-CoV-2 target nucleic acids are NOT DETECTED.  The SARS-CoV-2 RNA is generally detectable in upper respiratory specimens during the acute phase of infection. The lowest concentration of SARS-CoV-2 viral copies this assay can detect is 138 copies/mL. A negative result does not preclude SARS-Cov-2 infection and should not be used as the sole basis for treatment or other patient management decisions. A negative result may occur with  improper specimen collection/handling, submission of specimen other than nasopharyngeal swab, presence of viral mutation(s) within the areas targeted by  this assay, and inadequate number of viral copies(<138 copies/mL). A negative result must be combined with clinical observations, patient history, and epidemiological information. The expected result is Negative.  Fact Sheet for Patients:  EntrepreneurPulse.com.au  Fact Sheet for Healthcare Providers:  IncredibleEmployment.be  This test is no t yet approved or cleared by the [place] FDA and  has been authorized for detection and/or diagnosis of SARS-CoV-2 by FDA under an Emergency Use Authorization (EUA). This EUA will remain  in effect (meaning this test can be used) for the duration of the COVID-19 declaration under Section 564(b)(1) of the Act, 21 U.S.C.section 360bbb-3(b)(1), unless the authorization is terminated  or revoked sooner.       Influenza A by PCR  NEGATIVE NEGATIVE Final   Influenza B by PCR NEGATIVE NEGATIVE Final    Comment: (NOTE) The Xpert Xpress SARS-CoV-2/FLU/RSV plus assay is intended as an aid in the diagnosis of influenza from Nasopharyngeal swab specimens and should not be used as a sole basis for treatment. Nasal washings and aspirates are unacceptable for Xpert Xpress SARS-CoV-2/FLU/RSV testing.  Fact Sheet for Patients: EntrepreneurPulse.com.au  Fact Sheet for Healthcare Providers: IncredibleEmployment.be  This test is not yet approved or cleared by the [place] FDA and has been authorized for detection and/or diagnosis of SARS-CoV-2 by FDA under an Emergency Use Authorization (EUA). This EUA will remain in effect (meaning this test can be used) for the duration of the COVID-19 declaration under Section 564(b)(1) of the Act, 21 U.S.C. section 360bbb-3(b)(1), unless the authorization is terminated or revoked.  Performed at [facility], [address] [place], [place] [address]      Discharge Instructions:   Discharge Instructions    AMB referral  to CHF clinic   Complete by: As directed    Ambulatory referral to Nutrition and Diabetic Education   Complete by: As directed    DM for 18-20 years; was taking Humalog 75/25 45 units QAM at home. Admitted with hypoglycemia. In talking with pt he reports he gets "shakes" a lot and he takes insulin to get the shakes to stop and then he reported he eats peanut butter sandwich when he feels that way. Pt noted to have hypoglycemia while inpatient so insulin stopped at discharge. Have asked pt to check CBGs before meals, at bedtime, and anytime he gets the "shakes". Please follow up with patient.   Diet - low sodium heart healthy   Complete by: As directed    Diet Carb Modified   Complete by: As directed    Discharge instructions   Complete by: As directed    Do not take insulin or Actos until further instructions from your primary care physician. Follow-up with PCP, Threasa Alpha, NP) Monday, 06/07/2020, as scheduled.   Face-to-face encounter (required for Medicare/Medicaid patients)   Complete by: As directed    I Ivionna Verley certify that this patient is under my care and that I, or a nurse practitioner or physician's assistant working with me, had a face-to-face encounter that meets the physician face-to-face encounter requirements with this patient on 06/02/2020. The encounter with the patient was in whole, or in part for the following medical condition(s) which is the primary reason for home health care (List medical condition): Acute on chronic diastolic CHF, insulin-dependent diabetes with hypoglycemia   The encounter with the patient was in whole, or in part, for the following medical condition, which is the primary reason for home health care: Acute on chronic diastolic CHF, insulin-dependent diabetes with hypoglycemia   I certify that, based on my findings, the following services are medically necessary home health services: Nursing   Reason for Medically Necessary Home Health Services:   Skilled Nursing- Changes in Medication/Medication Management Skilled Nursing- Skilled Assessment/Observation     My clinical findings support the need for the above services: OTHER SEE COMMENTS   Further, I certify that my clinical findings support that this patient is homebound due to: Shortness of Breath with activity   Home Health   Complete by: As directed    To provide the following care/treatments: RN   Increase activity slowly   Complete by: As directed      Allergies as of 06/02/2020   No Known Allergies  Medication List    STOP taking these medications   amLODIPine-Valsartan-HCTZ 10-320-25 MG Tabs   HumaLOG Mix 75/25 KwikPen (75-25) 100 UNIT/ML Kwikpen Generic drug: Insulin Lispro Prot & Lispro   KLOR-CON PO   lovastatin 20 MG tablet Commonly known as: MEVACOR   pioglitazone 45 MG tablet Commonly known as: ACTOS   polyethylene glycol powder 17 GM/SCOOP powder Commonly known as: GLYCOLAX/MIRALAX     TAKE these medications   amLODipine 10 MG tablet Commonly known as: NORVASC Take 1 tablet (10 mg total) by mouth daily. Start taking on: June 03, 2020   aspirin EC 81 MG tablet Take 81 mg by mouth daily.   atorvastatin 40 MG tablet Commonly known as: LIPITOR Take 40 mg by mouth at bedtime.   blood glucose meter kit and supplies Dispense based on patient and insurance preference. Use up to four times daily as directed. (FOR ICD-10 E10.9, E11.9).   furosemide 40 MG tablet Commonly known as: LASIX Take 1 tablet (40 mg total) by mouth daily.   Klor-Con M20 20 MEQ tablet Generic drug: potassium chloride SA Take 1 tablet (20 mEq total) by mouth daily. What changed: when to take this   losartan 100 MG tablet Commonly known as: COZAAR Take 1 tablet (100 mg total) by mouth daily.   metoprolol tartrate 50 MG tablet Commonly known as: LOPRESSOR Take 1 tablet (50 mg total) by mouth 2 (two) times daily.       Follow-up Information    Remi Haggard,  FNP. Go on 06/10/2020.   Specialty: Family Medicine Why: hospital follow up visit. Go at 1:30pm. Contact information: [place] [address] [phone]        Murlean Iba, MD. Schedule an appointment as soon as possible for a visit in 1 week(s).   Specialty: Nephrology Contact information: [place] [address] [phone]                Time coordinating discharge: 38 minutes  Signed:  Jennye Boroughs  [facility] 06/02/2020, 4:23 PM   Pager on [email or web address]. If 7PM-7AM, please contact night-coverage at [email or web address]

## 2020-06-02 NOTE — Progress Notes (Signed)
Inpatient Diabetes Program Recommendations  AACE/ADA: New Consensus Statement on Inpatient Glycemic Control   Target Ranges:  Prepandial:   less than 140 mg/dL      Peak postprandial:   less than 180 mg/dL (1-2 hours)      Critically ill patients:  140 - 180 mg/dL  Results for Victor Hernandez, Victor Hernandez (MRN [id number]) as of 06/02/2020 14:29  Ref. Range 06/01/2020 10:56 06/01/2020 11:36 06/01/2020 19:16 06/01/2020 21:30  Glucose-Capillary Latest Ref Range: 70 - 99 mg/dL 42 (LL) 120 (H) 147 (H) 148 (H)   Results for Victor Hernandez, Victor Hernandez (MRN [id number]) as of 06/02/2020 14:29  Ref. Range 06/02/2020 07:30 06/02/2020 07:32 06/02/2020 07:46 06/02/2020 08:54 06/02/2020 12:01  Glucose-Capillary Latest Ref Range: 70 - 99 mg/dL 37 (LL) 36 (LL) 41 (LL) 102 (H) 137 (H)   Review of Glycemic Control  Diabetes history: DM2 Outpatient Diabetes medications: Humalog 75/25 45 units QAM Current orders for Inpatient glycemic control: CBGs, Novolog 0-9 units TID with meals  NOTE: Misty, RN reached out about talking with patient prior to discharge. Diabetes coordinator working remotely so called patient over the phone and talked with him. Patient states that he has had DM for 18-20 years and he reports that over the past year or so his DM medications have been changed several times due to insurance not wanting to cover medications that worked well for him. Patient states that he is only taking Humalog 75/25 45 units QAM for DM as an outpatient. Inquired about Actos and patient reports that his doctor took him off Actos. Patient reports that he is seeing Malachy Mood as his PCP Threasa Alpha, [place]) and she manages his DM medication.  When asked about checking his glucose patient reports that he last checked his glucose this past Sunday and it was 170 mg/dl. Patient then reported that he did not having a working glucometer and that he would get one on Monday. Explained that he will need to get a glucometer today and that he will need to check glucose before meals  and before bedtime. Asked patient about glucose goals and patient reported that a normal glucose was 180-200 mg/dl.  Discussed glucose goals of 80-130 mg/dl. In talking, patient reported that he gets the "shakes" a lot and he first stated that he would take his insulin when he felt like he had shakes and it would calm the shakes down. In a few sentences later, he stated that when he gets the shakes he eats peanut butter sandwiches. Discussed hypoglycemia, signs &symptoms along with proper treatment. Explained that if his sugar is low and he takes more insulin, it will make the glucose lower. Asked patient to be sure to get a working glucometer today and to check glucose AC&HS and any time he feels like he is getting the shakes. Patient reports that he was told by the doctor today to stop taking any medication for DM until he follows up with PCP on Monday. Asked patient to keep a written log of glucose values so he can take to doctor appointments or either be sure to take the glucometer with him each time to every appointment. Informed patient that a living well with DM book would be ordered. Asked patient to read entire book so he can learn more about DM management. Patient was able to answer questions appropriately when asked about things we discussed. Patient states that he will see if he has enough money at home, "if someone has not stolen it, I have been having people  steal a lot from me lately". Stressed to patient that it was very important that he get a glucometer today and start checking glucose as we discussed. Encouraged patient to ask his family if he needs financial help so he can get a new glucometer. Patient verbalized understanding of information discussed and he states he has no questions at this time. Talked with Misty, RN to inform her of information obtained and given to patient. Ordered outpatient DM education as well for follow up (MD cosign required). Also added specific instructions for  monitoring glucose and when to notify PCP (as discussed with patient) on discharge paperwork.   Thanks, Barnie Alderman, RN, MSN, CDE Diabetes Coordinator Inpatient Diabetes Program [phone] (Team Pager from 8am to 5pm)

## 2020-06-02 NOTE — Progress Notes (Signed)
Nutrition Brief Note  RD received consult for nutritional assessment as pt with low albumin.  65 year old male with history of hypertension, diabetes type 2 who presented to the emergency department with confusion.  Met with pt in room today. Pt sitting up in chair, fully dressed and reports that he is discharging home today. Pt reports good appetite and oral intake at baseline. Pt is documented to be eating 100% of meals in hospital. Pt is ordered for Ensure Enlive; pt reports that after he drank the Ensure, he started to have tingling in his fingers and then he did not drink it anymore. Pt is worried that the Ensure may be affecting his blood sugar. RD will change pt over from Ensure Enlive to Ensure Max protein as this is lower in sugar.   Wt Readings from Last 15 Encounters:  06/02/20 114.8 kg  05/19/16 105.2 kg  05/02/16 105.2 kg    Body mass index is 42.1 kg/m. Patient meets criteria for morbid obesity based on current BMI. Pt reports that his weight is stable pta.  Current diet order is regular, patient is consuming approximately 100% of meals at this time. Labs and medications reviewed.   No nutrition interventions warranted at this time. If nutrition issues arise, please consult RD.   Koleen Distance MS, RD, LDN Please refer to [facility] for RD and/or RD on-call/weekend/after hours pager

## 2020-06-02 NOTE — Discharge Instructions (Signed)
° °Diabetes Basics ° °Diabetes (diabetes mellitus) is a long-term (chronic) disease. It occurs when the body does not properly use sugar (glucose) that is released from food after you eat. °Diabetes may be caused by one or both of these problems: °· Your pancreas does not make enough of a hormone called insulin. °· Your body does not react in a normal way to insulin that it makes. °Insulin lets sugars (glucose) go into cells in your body. This gives you energy. If you have diabetes, sugars cannot get into cells. This causes high blood sugar (hyperglycemia). °Follow these instructions at home: °How is diabetes treated? °You may need to take insulin or other diabetes medicines daily to keep your blood sugar in balance. Take your diabetes medicines every day as told by your doctor. List your diabetes medicines here: °Diabetes medicines °· Name of medicine: ______________________________ °? Amount (dose): _______________ Time (a.m./p.m.): _______________ Notes: ___________________________________ °· Name of medicine: ______________________________ °? Amount (dose): _______________ Time (a.m./p.m.): _______________ Notes: ___________________________________ °· Name of medicine: ______________________________ °? Amount (dose): _______________ Time (a.m./p.m.): _______________ Notes: ___________________________________ °If you use insulin, you will learn how to give yourself insulin by injection. You may need to adjust the amount based on the food that you eat. List the types of insulin you use here: °Insulin °· Insulin type: ______________________________ °? Amount (dose): _______________ Time (a.m./p.m.): _______________ Notes: ___________________________________ °· Insulin type: ______________________________ °? Amount (dose): _______________ Time (a.m./p.m.): _______________ Notes: ___________________________________ °· Insulin type: ______________________________ °? Amount (dose): _______________ Time (a.m./p.m.):  _______________ Notes: ___________________________________ °· Insulin type: ______________________________ °? Amount (dose): _______________ Time (a.m./p.m.): _______________ Notes: ___________________________________ °· Insulin type: ______________________________ °? Amount (dose): _______________ Time (a.m./p.m.): _______________ Notes: ___________________________________ °How do I manage my blood sugar? ° °Check your blood sugar levels using a blood glucose monitor as directed by your doctor. °Your doctor will set treatment goals for you. Generally, you should have these blood sugar levels: °· Before meals (preprandial): 80-130 mg/dL (4.4-7.2 mmol/L). °· After meals (postprandial): below 180 mg/dL (10 mmol/L). °· A1c level: less than 7%. °Write down the times that you will check your blood sugar levels: °Blood sugar checks °· Time: _______________ Notes: ___________________________________ °· Time: _______________ Notes: ___________________________________ °· Time: _______________ Notes: ___________________________________ °· Time: _______________ Notes: ___________________________________ °· Time: _______________ Notes: ___________________________________ °· Time: _______________ Notes: ___________________________________ ° °What do I need to know about low blood sugar? °Low blood sugar is called hypoglycemia. This is when blood sugar is at or below 70 mg/dL (3.9 mmol/L). Symptoms may include: °· Feeling: °? Hungry. °? Worried or nervous (anxious). °? Sweaty and clammy. °? Confused. °? Dizzy. °? Sleepy. °? Sick to your stomach (nauseous). °· Having: °? A fast heartbeat. °? A headache. °? A change in your vision. °? Tingling or no feeling (numbness) around the mouth, lips, or tongue. °? Jerky movements that you cannot control (seizure). °· Having trouble with: °? Moving (coordination). °? Sleeping. °? Passing out (fainting). °? Getting upset easily (irritability). °Treating low blood sugar °To treat low blood  sugar, eat or drink something sugary right away. If you can think clearly and swallow safely, follow the 15:15 rule: °· Take 15 grams of a fast-acting carb (carbohydrate). Talk with your doctor about how much you should take. °· Some fast-acting carbs are: °? Sugar tablets (glucose pills). Take 3-4 glucose pills. °? 6-8 pieces of hard candy. °? 4-6 oz (120-150 mL) of fruit juice. °? 4-6 oz (120-150 mL) of regular (not diet) soda. °? 1 Tbsp (15 mL) honey or sugar. °·   Check your blood sugar 15 minutes after you take the carb.  If your blood sugar is still at or below 70 mg/dL (3.9 mmol/L), take 15 grams of a carb again.  If your blood sugar does not go above 70 mg/dL (3.9 mmol/L) after 3 tries, get help right away.  After your blood sugar goes back to normal, eat a meal or a snack within 1 hour. Treating very low blood sugar If your blood sugar is at or below 54 mg/dL (3 mmol/L), you have very low blood sugar (severe hypoglycemia). This is an emergency. Do not wait to see if the symptoms will go away. Get medical help right away. Call your local emergency services (911 in the [place] Do not drive yourself to the hospital. Questions to ask your health care provider  Do I need to meet with a diabetes educator?  What equipment will I need to care for myself at home?  What diabetes medicines do I need? When should I take them?  How often do I need to check my blood sugar?  What number can I call if I have questions?  When is my next doctor's visit?  Where can I find a support group for people with diabetes? Where to find more information  American Diabetes Association: www.diabetes.org  American Association of Diabetes Educators: www.diabeteseducator.org/patient-resources Contact a doctor if:  Your blood sugar is at or above 240 mg/dL (13.3 mmol/L) for 2 days in a row.  You have been sick or have had a fever for 2 days or more, and you are not getting better.  You have any of these  problems for more than 6 hours: ? You cannot eat or drink. ? You feel sick to your stomach (nauseous). ? You throw up (vomit). ? You have watery poop (diarrhea). Get help right away if:  Your blood sugar is lower than 54 mg/dL (3 mmol/L).  You get confused.  You have trouble: ? Thinking clearly. ? Breathing. Summary  Diabetes (diabetes mellitus) is a long-term (chronic) disease. It occurs when the body does not properly use sugar (glucose) that is released from food after digestion.  Take insulin and diabetes medicines as told.  Check your blood sugar every day, as often as told.  Keep all follow-up visits as told by your doctor. This is important. This information is not intended to replace advice given to you by your health care provider. Make sure you discuss any questions you have with your health care provider. Document Revised: 02/05/2019 Document Reviewed: 08/17/2017 Elsevier Patient Education  2020 [place] you blood sugar before each meal and at bedtime. Also check your blood sugar if you feel shaky.  If your glucose is less than 70 mg/dl you need to treat it with about 15 grams of carbohydrates (like 4 ounces of juice) and wait 15 minutes and recheck it to be sure it is over 70 mg/dl.  If you find that your blood sugar is staying 180 mg/dl or higher or if you have any blood sugars less than 70 mg/dl, then call Threasa Alpha, FNP and see if you can be seen for advice on what to do about your diabetes control. Be sure to write down your blood sugars or take your glucometer with you to your appointments.

## 2020-06-02 NOTE — TOC Initial Note (Signed)
Transition of Care [facility]) - Initial/Assessment Note    Patient Details  Name: Victor Hernandez MRN: [id number] Date of Birth: 08/04/1955  [facility] CM/SW Contact:    Beverly Sessions, RN Phone Number: 06/02/2020, 1:26 PM  Clinical Narrative:                  Patient admitted from home with acute encephalopathy Patient lives at home alone  [facility]  Denies issues with transportation or medications  Patient has a glucumeter at home that is not working.  MD to order new one and patient confirms he will be able to obtain at discharge  Diabetes coordinator to be ordered while inpatient Patient agreeable to home health RN for medication management and state he does not have a preference of agency.  Referral made and accepted by [place] with [facility]    Expected Discharge Plan: [place] Barriers to Discharge: No Barriers Identified   Patient Goals and CMS Choice        Expected Discharge Plan and Services Expected Discharge Plan: [place]       Living arrangements for the past 2 months: Single Family Home                           HH Arranged: RN [facility] Agency: [facility] Date [facility] Agency Contacted: 06/02/20   Representative spoke with at [place]: [place]  Prior Living Arrangements/Services Living arrangements for the past 2 months: [place] Lives with:: Self Patient language and need for interpreter reviewed:: Yes Do you feel safe going back to the place where you live?: Yes            Criminal Activity/Legal Involvement Pertinent to Current Situation/Hospitalization: No - Comment as needed  Activities of Daily Living Home Assistive Devices/Equipment: None ADL Screening (condition at time of admission) Patient's cognitive ability adequate to safely complete daily activities?: Yes Is the patient deaf or have difficulty hearing?: No Does the patient have difficulty seeing,  even when wearing glasses/contacts?: No Does the patient have difficulty concentrating, remembering, or making decisions?: No Patient able to express need for assistance with ADLs?: Yes Does the patient have difficulty dressing or bathing?: No Independently performs ADLs?: Yes (appropriate for developmental age) Does the patient have difficulty walking or climbing stairs?: No Weakness of Legs: Both Weakness of Arms/Hands: None  Permission Sought/Granted                  Emotional Assessment       Orientation: : Oriented to Self,Oriented to Place,Oriented to  Time,Oriented to Situation      Admission diagnosis:  Hallucinations [R44.3] Hypernatremia [E87.0] Transaminitis [R74.01] Elevated brain natriuretic peptide (BNP) level [R79.89] Acute respiratory failure with hypoxia ([place]) [J96.01] Acute encephalopathy [G93.40] Creatinine elevation [R79.89] Hypertension, unspecified type [I10] Patient Active Problem List   Diagnosis Date Noted  . Diabetes mellitus without complication ([place])   . Elevated serum creatinine   . (HFpEF) heart failure with preserved ejection fraction ([place])   . Hypertensive crisis   . Acute encephalopathy 05/31/2020   PCP:  Remi Haggard, FNP Pharmacy:   [id number] [place], [place] - [address] [place] [address] Phone: [phone] Fax: [phone]     Social Determinants of Health (SDOH) Interventions    Readmission Risk Interventions No flowsheet data found.

## 2020-06-03 ENCOUNTER — Telehealth: Payer: Self-pay | Admitting: Family

## 2020-06-03 LAB — GLUCOSE, CAPILLARY: Glucose-Capillary: 37 mg/dL — CL (ref 70–99)

## 2020-06-03 NOTE — Telephone Encounter (Signed)
LVM with patient in attempt to schedule a New Patient appointment after his recent hospital D/C.    Ashelyn Mccravy, NT

## 2020-06-08 NOTE — Progress Notes (Deleted)
   Patient ID: Victor Hernandez, male    DOB: 1955/07/09, 65 y.o.   MRN: [id number]  HPI  Victor Hernandez is a 65 y/o male with a history of  Echo report from 06/01/20 reviewed and showed an EF of 50-55% along with moderate LVH, moderate LAE and moderate/ severe Victor.   Admitted 05/31/20 due to AMS/ visual hallucinations. BP elevated (200/136) and was found to be in HF. Initially given IV lasix with transition to oral diuretics. Discharged after 2 days.   He presents today for his initial visit with a chief complaint of  Review of Systems    Physical Exam    Assessment & Plan:  1: Chronic heart failure with preserved ejection fraction along with structural changes (LVH/ LAE)- - NYHA class - BNP 05/31/20 was 695.5  2: HTN- - BP - BMP 06/02/20 reviewed and showed sodium 146, potassium 4.4, creatinine 2.48 and GFR 28  3: DM with CKD- - A1c 06/01/20 was 7.2%

## 2020-06-09 ENCOUNTER — Ambulatory Visit: Payer: Medicare HMO | Admitting: Family

## 2020-06-09 ENCOUNTER — Telehealth: Payer: Self-pay | Admitting: Family

## 2020-06-09 NOTE — Telephone Encounter (Signed)
Patient did not show for his Heart Failure Clinic appointment on 06/09/20. Will attempt to reschedule.

## 2020-06-28 ENCOUNTER — Ambulatory Visit: Payer: Medicare HMO | Admitting: Family

## 2020-06-28 ENCOUNTER — Other Ambulatory Visit: Payer: Self-pay

## 2020-06-28 ENCOUNTER — Telehealth: Payer: Self-pay | Admitting: Family

## 2020-06-28 NOTE — Telephone Encounter (Signed)
Patient did not show for his Heart Failure Clinic appointment on 06/28/20. Will attempt to reschedule.

## 2020-07-05 ENCOUNTER — Encounter: Payer: Self-pay | Admitting: Emergency Medicine

## 2020-07-05 ENCOUNTER — Other Ambulatory Visit: Payer: Self-pay

## 2020-07-05 ENCOUNTER — Emergency Department: Payer: Medicare HMO

## 2020-07-05 ENCOUNTER — Inpatient Hospital Stay
Admission: EM | Admit: 2020-07-05 | Discharge: 2020-07-12 | DRG: 291 | Disposition: A | Discharge status: Home / Self Care | Payer: Medicare HMO | Attending: Internal Medicine | Admitting: Internal Medicine

## 2020-07-05 ENCOUNTER — Telehealth: Payer: Self-pay | Admitting: Family

## 2020-07-05 DIAGNOSIS — Z87891 Personal history of nicotine dependence: Secondary | ICD-10-CM | POA: Diagnosis not present

## 2020-07-05 DIAGNOSIS — J9811 Atelectasis: Secondary | ICD-10-CM | POA: Diagnosis present

## 2020-07-05 DIAGNOSIS — Z6841 Body Mass Index (BMI) 40.0 and over, adult: Secondary | ICD-10-CM

## 2020-07-05 DIAGNOSIS — D631 Anemia in chronic kidney disease: Secondary | ICD-10-CM | POA: Diagnosis present

## 2020-07-05 DIAGNOSIS — R0602 Shortness of breath: Secondary | ICD-10-CM

## 2020-07-05 DIAGNOSIS — I2510 Atherosclerotic heart disease of native coronary artery without angina pectoris: Secondary | ICD-10-CM | POA: Diagnosis present

## 2020-07-05 DIAGNOSIS — Z20822 Contact with and (suspected) exposure to covid-19: Secondary | ICD-10-CM | POA: Diagnosis present

## 2020-07-05 DIAGNOSIS — N184 Chronic kidney disease, stage 4 (severe): Secondary | ICD-10-CM | POA: Diagnosis present

## 2020-07-05 DIAGNOSIS — N179 Acute kidney failure, unspecified: Secondary | ICD-10-CM | POA: Diagnosis present

## 2020-07-05 DIAGNOSIS — Z79899 Other long term (current) drug therapy: Secondary | ICD-10-CM

## 2020-07-05 DIAGNOSIS — I471 Supraventricular tachycardia: Secondary | ICD-10-CM | POA: Diagnosis not present

## 2020-07-05 DIAGNOSIS — I509 Heart failure, unspecified: Secondary | ICD-10-CM | POA: Diagnosis not present

## 2020-07-05 DIAGNOSIS — J9601 Acute respiratory failure with hypoxia: Secondary | ICD-10-CM | POA: Diagnosis present

## 2020-07-05 DIAGNOSIS — I340 Nonrheumatic mitral (valve) insufficiency: Secondary | ICD-10-CM | POA: Diagnosis present

## 2020-07-05 DIAGNOSIS — I169 Hypertensive crisis, unspecified: Secondary | ICD-10-CM

## 2020-07-05 DIAGNOSIS — E1122 Type 2 diabetes mellitus with diabetic chronic kidney disease: Secondary | ICD-10-CM | POA: Diagnosis present

## 2020-07-05 DIAGNOSIS — I5043 Acute on chronic combined systolic (congestive) and diastolic (congestive) heart failure: Secondary | ICD-10-CM | POA: Diagnosis present

## 2020-07-05 DIAGNOSIS — I5033 Acute on chronic diastolic (congestive) heart failure: Secondary | ICD-10-CM

## 2020-07-05 DIAGNOSIS — Z7982 Long term (current) use of aspirin: Secondary | ICD-10-CM

## 2020-07-05 DIAGNOSIS — I472 Ventricular tachycardia: Secondary | ICD-10-CM | POA: Diagnosis not present

## 2020-07-05 DIAGNOSIS — N5089 Other specified disorders of the male genital organs: Secondary | ICD-10-CM | POA: Diagnosis present

## 2020-07-05 DIAGNOSIS — Z9111 Patient's noncompliance with dietary regimen: Secondary | ICD-10-CM | POA: Diagnosis not present

## 2020-07-05 DIAGNOSIS — J810 Acute pulmonary edema: Secondary | ICD-10-CM

## 2020-07-05 DIAGNOSIS — E785 Hyperlipidemia, unspecified: Secondary | ICD-10-CM | POA: Diagnosis present

## 2020-07-05 DIAGNOSIS — I10 Essential (primary) hypertension: Secondary | ICD-10-CM | POA: Diagnosis not present

## 2020-07-05 DIAGNOSIS — I130 Hypertensive heart and chronic kidney disease with heart failure and stage 1 through stage 4 chronic kidney disease, or unspecified chronic kidney disease: Principal | ICD-10-CM | POA: Diagnosis present

## 2020-07-05 DIAGNOSIS — I5021 Acute systolic (congestive) heart failure: Secondary | ICD-10-CM | POA: Diagnosis not present

## 2020-07-05 LAB — BASIC METABOLIC PANEL
Anion gap: 10 (ref 5–15)
BUN: 59 mg/dL — ABNORMAL HIGH (ref 8–23)
CO2: 27 mmol/L (ref 22–32)
Calcium: 8.3 mg/dL — ABNORMAL LOW (ref 8.9–10.3)
Chloride: 104 mmol/L (ref 98–111)
Creatinine, Ser: 2.94 mg/dL — ABNORMAL HIGH (ref 0.61–1.24)
GFR, Estimated: 23 mL/min — ABNORMAL LOW (ref >60)
Glucose, Bld: 388 mg/dL — ABNORMAL HIGH (ref 70–99)
Potassium: 3.9 mmol/L (ref 3.5–5.1)
Sodium: 141 mmol/L (ref 135–145)

## 2020-07-05 LAB — BRAIN NATRIURETIC PEPTIDE: B Natriuretic Peptide: 866.4 pg/mL — ABNORMAL HIGH (ref 0.0–100.0)

## 2020-07-05 LAB — RETICULOCYTES
Immature Retic Fract: 31.6 % — ABNORMAL HIGH (ref 2.3–15.9)
RBC.: 3.69 MIL/uL — ABNORMAL LOW (ref 4.22–5.81)
Retic Count, Absolute: 98.9 10*3/uL (ref 19.0–186.0)
Retic Ct Pct: 2.7 % (ref 0.4–3.1)

## 2020-07-05 LAB — CBC
HCT: 37.2 % — ABNORMAL LOW (ref 39.0–52.0)
Hemoglobin: 11.5 g/dL — ABNORMAL LOW (ref 13.0–17.0)
MCH: 28.9 pg (ref 26.0–34.0)
MCHC: 30.9 g/dL (ref 30.0–36.0)
MCV: 93.5 fL (ref 80.0–100.0)
Platelets: 262 10*3/uL (ref 150–400)
RBC: 3.98 MIL/uL — ABNORMAL LOW (ref 4.22–5.81)
RDW: 14.1 % (ref 11.5–15.5)
WBC: 8.5 10*3/uL (ref 4.0–10.5)
nRBC: 0 % (ref 0.0–0.2)

## 2020-07-05 MED ORDER — METOPROLOL TARTRATE 50 MG PO TABS
50.0000 mg | ORAL_TABLET | Freq: Two times a day (BID) | ORAL | Status: DC
  Administered 2020-07-06 – 2020-07-12 (×14): 50 mg via ORAL
  Filled 2020-07-05 (×14): qty 1

## 2020-07-05 MED ORDER — SODIUM CHLORIDE 0.9% FLUSH: 3.0000 mL | INTRAVENOUS | Status: DC | PRN

## 2020-07-05 MED ORDER — AMLODIPINE BESYLATE 10 MG PO TABS
10.0000 mg | ORAL_TABLET | Freq: Every day | ORAL | Status: DC
  Administered 2020-07-06: 10 mg via ORAL
  Filled 2020-07-05: qty 2

## 2020-07-05 MED ORDER — ATORVASTATIN CALCIUM 20 MG PO TABS
40.0000 mg | ORAL_TABLET | Freq: Every day | ORAL | Status: DC
  Administered 2020-07-06 – 2020-07-11 (×7): 40 mg via ORAL
  Filled 2020-07-05 (×8): qty 2

## 2020-07-05 MED ORDER — SODIUM CHLORIDE 0.9 % IV SOLN: 250.0000 mL | INTRAVENOUS | Status: DC | PRN

## 2020-07-05 MED ORDER — FUROSEMIDE 10 MG/ML IJ SOLN
80.0000 mg | Freq: Two times a day (BID) | INTRAMUSCULAR | Status: DC
  Administered 2020-07-05: 40 mg via INTRAVENOUS
  Filled 2020-07-05: qty 8

## 2020-07-05 MED ORDER — ONDANSETRON HCL 4 MG/2ML IJ SOLN: 4.0000 mg | Freq: Four times a day (QID) | INTRAMUSCULAR | Status: DC | PRN

## 2020-07-05 MED ORDER — ACETAMINOPHEN 325 MG PO TABS
650.0000 mg | ORAL_TABLET | ORAL | Status: DC | PRN
  Administered 2020-07-10 – 2020-07-11 (×3): 650 mg via ORAL
  Filled 2020-07-05 (×3): qty 2

## 2020-07-05 MED ORDER — ASPIRIN EC 81 MG PO TBEC
81.0000 mg | DELAYED_RELEASE_TABLET | Freq: Every day | ORAL | Status: DC
Start: 2020-07-06 — End: 2020-07-12
  Administered 2020-07-06 – 2020-07-12 (×7): 81 mg via ORAL
  Filled 2020-07-05 (×7): qty 1

## 2020-07-05 MED ORDER — SODIUM CHLORIDE 0.9% FLUSH
3.0000 mL | Freq: Two times a day (BID) | INTRAVENOUS | Status: DC
  Administered 2020-07-06 – 2020-07-12 (×14): 3 mL via INTRAVENOUS

## 2020-07-05 NOTE — ED Notes (Signed)
Pt reports swelling to private area and legs for several days.  Intermittent sob. Denies chest pain.   Pt alert  Speech clear.  Sinus tach on monitor.

## 2020-07-05 NOTE — ED Notes (Signed)
Pt eating dinner tray.  Pt waiting on admission.

## 2020-07-05 NOTE — ED Triage Notes (Signed)
Pt via POV from home. Pt c/o edema in his lower extremities that has now travelled up into his scrotum and abdomen. Pt states he is SOB on exertion. Denies hx of CHF and does not know if he takes Lasix. Pt is A&Ox4 and NAD. Denies pain.

## 2020-07-05 NOTE — ED Provider Notes (Signed)
[facility] Emergency Department Provider Note   ____________________________________________   Event Date/Time   First MD Initiated Contact with Patient 07/05/20 1959     (approximate)  I have reviewed the triage vital signs and the nursing notes.   HISTORY  Chief Complaint Edema    HPI Victor Hernandez is a 65 y.o. male with a stated past medical history of type 2 diabetes, hypertension, and CHF who presents for worsening shortness of breath as well of worsening swelling to bilateral lower extremities and his groin.  Patient states that shortness of breath is worse with exertion and partially relieved at rest.  Patient endorses PND and orthopnea.  Patient currently denies any vision changes, tinnitus, difficulty speaking, facial droop, sore throat, chest pain, abdominal pain, nausea/vomiting/diarrhea, dysuria, or weakness/numbness/paresthesias in any extremity         Past Medical History:  Diagnosis Date  . Diabetes mellitus without complication ([place])   . Hyperlipidemia   . Hypertension     Patient Active Problem List   Diagnosis Date Noted  . Type 2 diabetes mellitus with stage 4 chronic kidney disease ([place])   . Elevated serum creatinine   . (HFpEF) heart failure with preserved ejection fraction ([place])   . Hypertensive crisis   . Acute encephalopathy 05/31/2020    Past Surgical History:  Procedure Laterality Date  . COLONOSCOPY    . COLONOSCOPY WITH PROPOFOL N/A 05/19/2016   Procedure: COLONOSCOPY WITH PROPOFOL;  Surgeon: Christene Lye, MD;  Location: [facility] ENDOSCOPY;  Service: Endoscopy;  Laterality: N/A;    Prior to Admission medications   Medication Sig Start Date End Date Taking? Authorizing Provider  amLODipine (NORVASC) 10 MG tablet Take 1 tablet (10 mg total) by mouth daily. 06/03/20   Jennye Boroughs, MD  aspirin EC 81 MG tablet Take 81 mg by mouth daily.    [provider]  atorvastatin (LIPITOR) 40 MG tablet Take 40  mg by mouth at bedtime. 02/24/20   [provider]  blood glucose meter kit and supplies Dispense based on patient and insurance preference. Use up to four times daily as directed. (FOR ICD-10 E10.9, E11.9). 06/02/20   Jennye Boroughs, MD  furosemide (LASIX) 40 MG tablet Take 1 tablet (40 mg total) by mouth daily. 06/02/20   Jennye Boroughs, MD  KLOR-CON M20 20 MEQ tablet Take 1 tablet (20 mEq total) by mouth daily. 06/02/20   Jennye Boroughs, MD  losartan (COZAAR) 100 MG tablet Take 1 tablet (100 mg total) by mouth daily. 06/02/20   Jennye Boroughs, MD  metoprolol tartrate (LOPRESSOR) 50 MG tablet Take 1 tablet (50 mg total) by mouth 2 (two) times daily. 06/02/20   Jennye Boroughs, MD    Allergies Patient has no known allergies.  History reviewed. No pertinent family history.  Social History Social History   Tobacco Use  . Smoking status: Never Smoker  . Smokeless tobacco: Never Used  Substance Use Topics  . Alcohol use: No  . Drug use: No    Review of Systems Constitutional: No fever/chills Eyes: No visual changes. ENT: No sore throat. Cardiovascular: Denies chest pain. Respiratory: Endorses shortness of breath. Gastrointestinal: No abdominal pain.  No nausea, no vomiting.  No diarrhea. Genitourinary: Negative for dysuria.  Endorses scrotal swelling Musculoskeletal: Negative for acute arthralgias Skin: Negative for rash.  Endorses bilateral lower extremity swelling Neurological: Negative for headaches, weakness/numbness/paresthesias in any extremity Psychiatric: Negative for suicidal ideation/homicidal ideation   ____________________________________________   PHYSICAL EXAM:  VITAL  SIGNS: ED Triage Vitals  Enc Vitals Group     BP 07/05/20 1708 (!) 161/115     Pulse Rate 07/05/20 1708 (!) 116     Resp 07/05/20 1708 20     Temp 07/05/20 1708 97.7 F (36.5 C)     Temp Source 07/05/20 1708 Oral     SpO2 07/05/20 1708 96 %     Weight 07/05/20 1704 230 lb (104.3 kg)      Height 07/05/20 1704 '5\' 6"'  (1.676 m)     Head Circumference --      Peak Flow --      Pain Score 07/05/20 1704 0     Pain Loc --      Pain Edu? --      Excl. in [place]? --    Constitutional: Alert and oriented. Well appearing and in no acute distress. Eyes: Conjunctivae are normal. PERRL. Head: Atraumatic. Nose: No congestion/rhinnorhea. Mouth/Throat: Mucous membranes are moist. Neck: No stridor Cardiovascular: Grossly normal heart sounds.  Good peripheral circulation. Respiratory: Normal respiratory effort.  No retractions. Gastrointestinal: Soft and nontender. No distention. Musculoskeletal: No obvious deformities Neurologic:  Normal speech and language. No gross focal neurologic deficits are appreciated. Skin:  Skin is warm and dry. No rash noted.  2+ pitting edema to the thigh Psychiatric: Mood and affect are normal. Speech and behavior are normal.  ____________________________________________   LABS (all labs ordered are listed, but only abnormal results are displayed)  Labs Reviewed  BRAIN NATRIURETIC PEPTIDE - Abnormal; Notable for the following components:      Result Value   B Natriuretic Peptide 866.4 (*)    All other components within normal limits  CBC - Abnormal; Notable for the following components:   RBC 3.98 (*)    Hemoglobin 11.5 (*)    HCT 37.2 (*)    All other components within normal limits  BASIC METABOLIC PANEL - Abnormal; Notable for the following components:   Glucose, Bld 388 (*)    BUN 59 (*)    Creatinine, Ser 2.94 (*)    Calcium 8.3 (*)    GFR, Estimated 23 (*)    All other components within normal limits   ____________________________________________  EKG  ED ECG REPORT I, Naaman Plummer, the attending physician, personally viewed and interpreted this ECG.  Date: 07/05/2020 EKG Time: 1700 Rate: 114 Rhythm: Tachycardic sinus rhythm QRS Axis: normal Intervals: normal ST/T Wave abnormalities: normal Narrative Interpretation: no evidence  of acute ischemia  ____________________________________________  RADIOLOGY  ED MD interpretation: 2 view x-ray of the chest shows small left pleural effusion and bibasilar predominant interstitial and airspace opacities concerning for pulmonary edema  Official radiology report(s): DG Chest 2 View  Result Date: 07/05/2020 CLINICAL DATA:  Shortness of breath on exertion, complained of edema in lower extremities EXAM: CHEST - 2 VIEW COMPARISON:  Chest radiograph May 31, 2020 and chest CT May 31, 2020 FINDINGS: Enlarged cardiac silhouette. Small left pleural effusion. Bibasilar predominant interstitial and airspace opacities. The visualized skeletal structures are unremarkable. IMPRESSION: Cardiomegaly with small left pleural effusion and bibasilar predominant interstitial and airspace opacities. Findings may reflect CHF/pulmonary edema. Electronically Signed   By: Dahlia Bailiff MD   On: 07/05/2020 17:38    ____________________________________________   PROCEDURES  Procedure(s) performed (including Critical Care):  .1-3 Lead EKG Interpretation Performed by: Naaman Plummer, MD Authorized by: Naaman Plummer, MD     Interpretation: normal     ECG rate:  98   ECG  rate assessment: normal     Rhythm: sinus rhythm     Ectopy: none     Conduction: normal   .Critical Care Performed by: Naaman Plummer, MD Authorized by: Naaman Plummer, MD   Critical care provider statement:    Critical care time (minutes):  35   Critical care time was exclusive of:  Separately billable procedures and treating other patients   Critical care was necessary to treat or prevent imminent or life-threatening deterioration of the following conditions:  Respiratory failure   Critical care was time spent personally by me on the following activities:  Discussions with consultants, evaluation of patient's response to treatment, examination of patient, ordering and performing treatments and interventions,  ordering and review of laboratory studies, ordering and review of radiographic studies, pulse oximetry, re-evaluation of patient's condition, obtaining history from patient or surrogate and review of old charts   I assumed direction of critical care for this patient from another provider in my specialty: no     Care discussed with: admitting provider       ____________________________________________   INITIAL IMPRESSION / ASSESSMENT AND PLAN / ED COURSE  As part of my medical decision making, I reviewed the following data within the electronic MEDICAL RECORD NUMBER Nursing notes reviewed and incorporated, Labs reviewed, EKG interpreted, Old chart reviewed, Radiograph reviewed and Notes from prior ED visits reviewed and incorporated        + dyspnea +LE edema + Non adherence to medication regimen  Workup: ECG, CBC, BMP, Troponin, BNP, CXR Findings: EKG: No STEMI and no evidence of Brugadas sign, delta wave, epsilon wave, significantly prolonged QTc, or malignant arrhythmia. BNP: 866 CXR: Bilateral pulmonary edema Based on history, exam and findings, presentation most consistent with acute on chronic heart failure. Low suspicion for PNA, ACS, tamponade, aortic dissection. Interventions: Oxygen, Diuresis  Reassessment: Symptoms improved in ED with oxygen and diuresis   Disposition (Stable but not significantly improved): Admit to medicine for further monitoring and for improvement of medication regimen to control symptoms.       ____________________________________________   FINAL CLINICAL IMPRESSION(S) / ED DIAGNOSES  Final diagnoses:  Shortness of breath  Acute pulmonary edema (HCC)  Acute on chronic congestive heart failure, unspecified heart failure type ([place])  Acute respiratory failure with hypoxia [facility])     ED Discharge Orders    None       Note:  This document was prepared using Dragon voice recognition software and may include unintentional dictation  errors.   Naaman Plummer, MD 07/05/20 [phone]

## 2020-07-05 NOTE — Telephone Encounter (Signed)
LVM with patient in attempt to reschedule a no show CHF Clinic appointment.   Carsin Randazzo, NT

## 2020-07-05 NOTE — H&P (Addendum)
History and Physical    Victor Hernandez:[id number] DOB: [birth year] DOA: 07/05/2020  PCP: Remi Haggard, FNP  Patient coming from: PCP office   I have personally briefly reviewed patient's old medical records in [place]  Chief Complaint: sob/lower extremity leg swelling progressive x weeks  HPI: Victor Hernandez is a 65 y.o. male with medical history significant of  hypertension, type 2 diabetes mellitus,CHF with Pef,CKDIV, morbid obesity who presents to ED with complaint of shortness of breath and progressive swelling of lower extremities and scrotum. Per chart patient has heart failure clinic follow up on 1/31 but was a no show for this appointment. Patient also has interim history of recent admission  1/3-06/02/2020 with diagnosis of Hypertensive crisis with associated hypertensive encephalopathy and acute on chronic diastolic heart failure exacerbation with associated acute hypoxic respiratory failure , patient was treated with lasix iv as well as IV hypertensive with improvement in symptoms with discharge with follow with Heart failure clinic for which he was a no show. Currently patient in addition to have above complaints notes DOE, orthopnea,and PND. He notes no associated chest pain, n/v/d/cough /fever/chills/ uri symptoms,dysuria or abdominal pain.He also noted that he has note being on a fluid or salt restriction. He also notes as an out patient he has had his diuretic increased but noted no change in his swelling. He also states he has been complaint with his medications.  ED Course:  Vitals: 97.7, bp 161/115, hr 116, sat 96% on 2L , has drop to 81 % in ed FUX:NATFT tachycardia, no st-twave changes Wbc:8.5, hgb 11.5 drop from 13, plt262 NA:141, K3.9, glu 388, cr 2.94 up from base of 2.3 BNP 866 DDU:[id number]: Cardiomegaly with small left pleural effusion and bibasilar predominant interstitial and airspace opacities. Findings may reflect CHF/pulmonary edema. Review of  Systems: As per HPI otherwise 10 point review of systems negative.   Past Medical History:  Diagnosis Date  . Diabetes mellitus without complication ([place])   . Hyperlipidemia   . Hypertension     Past Surgical History:  Procedure Laterality Date  . COLONOSCOPY    . COLONOSCOPY WITH PROPOFOL N/A 05/19/2016   Procedure: COLONOSCOPY WITH PROPOFOL;  Surgeon: Christene Lye, MD;  Location: [facility] ENDOSCOPY;  Service: Endoscopy;  Laterality: N/A;     reports that he has never smoked. He has never used smokeless tobacco. He reports that he does not drink alcohol and does not use drugs.  No Known Allergies  History reviewed. No pertinent family history.  Prior to Admission medications   Medication Sig Start Date End Date Taking? Authorizing Provider  amLODipine (NORVASC) 10 MG tablet Take 1 tablet (10 mg total) by mouth daily. 06/03/20   Jennye Boroughs, MD  aspirin EC 81 MG tablet Take 81 mg by mouth daily.    [provider]  atorvastatin (LIPITOR) 40 MG tablet Take 40 mg by mouth at bedtime. 02/24/20   [provider]  blood glucose meter kit and supplies Dispense based on patient and insurance preference. Use up to four times daily as directed. (FOR ICD-10 E10.9, E11.9). 06/02/20   Jennye Boroughs, MD  furosemide (LASIX) 40 MG tablet Take 1 tablet (40 mg total) by mouth daily. 06/02/20   Jennye Boroughs, MD  KLOR-CON M20 20 MEQ tablet Take 1 tablet (20 mEq total) by mouth daily. 06/02/20   Jennye Boroughs, MD  losartan (COZAAR) 100 MG tablet Take 1 tablet (100 mg total) by mouth daily. 06/02/20   Ayiku,  Ilona Sorrel, MD  metoprolol tartrate (LOPRESSOR) 50 MG tablet Take 1 tablet (50 mg total) by mouth 2 (two) times daily. 06/02/20   Jennye Boroughs, MD    Physical Exam: Vitals:   07/05/20 2145 07/05/20 2200 07/05/20 2215 07/05/20 2230  BP:  (!) 169/106    Pulse:    (!) 110  Resp: 19 (!) _0 Temp:      TempSrc:      SpO2:    100%  Weight:      Height:         Vitals:    07/05/20 2145 07/05/20 2200 07/05/20 2215 07/05/20 2230  BP:  (!) 169/106    Pulse:    (!) 110  Resp: 19 (!) _1 Temp:      TempSrc:      SpO2:    100%  Weight:      Height:      Constitutional: NAD, calm, comfortable Eyes: PERRL, lids and conjunctivae normal ENMT: Mucous membranes are moist. Posterior pharynx clear of any exudate or lesions.Normal dentition.  Neck: normal, supple, no masses, no thyromegaly Respiratory:  no wheezing, +crackles.diminished bases, Normal respiratory effort. No accessory muscle use.  Cardiovascular: Regular rate and rhythm, + murmurs no / rubs / gallops. + extremity edema. 2+ pedal pulses Abdomen: no tenderness, no masses palpated. No hepatosplenomegaly. Bowel sounds positive.  Musculoskeletal: no clubbing / cyanosis. No joint deformity upper and lower extremities. Good ROM, no contractures. Normal muscle tone.  Skin: no rashes, lesions, ulcers. Chronic venostasis changes ,lower extremities Neurologic: CN 2-12 grossly intact. Sensation intact. Strength 5/5 in all 4.  Psychiatric: Normal judgment and insight. Alert and oriented x 3. Normal mood.    Labs on Admission: I have personally reviewed following labs and imaging studies  CBC: Recent Labs  Lab 07/05/20 1709  WBC 8.5  HGB 11.5*  HCT 37.2*  MCV 93.5  PLT 854   Basic Metabolic Panel: Recent Labs  Lab 07/05/20 1709  NA 141  K 3.9  CL 104  CO2 27  GLUCOSE 388*  BUN 59*  CREATININE 2.94*  CALCIUM 8.3*   GFR: Estimated Creatinine Clearance: 28 mL/min (A) (by C-G formula based on SCr of 2.94 mg/dL (H)). Liver Function Tests: No results for input(s): AST, ALT, ALKPHOS, BILITOT, PROT, ALBUMIN in the last 168 hours. No results for input(s): LIPASE, AMYLASE in the last 168 hours. No results for input(s): AMMONIA in the last 168 hours. Coagulation Profile: No results for input(s): INR, PROTIME in the last 168 hours. Cardiac Enzymes: No results for input(s): CKTOTAL, CKMB,  CKMBINDEX, TROPONINI in the last 168 hours. BNP (last 3 results) No results for input(s): PROBNP in the last 8760 hours. HbA1C: No results for input(s): HGBA1C in the last 72 hours. CBG: No results for input(s): GLUCAP in the last 168 hours. Lipid Profile: No results for input(s): CHOL, HDL, LDLCALC, TRIG, CHOLHDL, LDLDIRECT in the last 72 hours. Thyroid Function Tests: No results for input(s): TSH, T4TOTAL, FREET4, T3FREE, THYROIDAB in the last 72 hours. Anemia Panel: No results for input(s): VITAMINB12, FOLATE, FERRITIN, TIBC, IRON, RETICCTPCT in the last 72 hours. Urine analysis:    Component Value Date/Time   COLORURINE YELLOW (A) 06/01/2020 1759   APPEARANCEUR CLEAR (A) 06/01/2020 1759   LABSPEC 1.010 06/01/2020 1759   PHURINE 5.0 06/01/2020 1759   GLUCOSEU 50 (A) 06/01/2020 1759   HGBUR MODERATE (A) 06/01/2020 1759   BILIRUBINUR NEGATIVE 06/01/2020 1759   KETONESUR NEGATIVE 06/01/2020 1759  PROTEINUR >=300 (A) 06/01/2020 1759   NITRITE NEGATIVE 06/01/2020 1759   LEUKOCYTESUR NEGATIVE 06/01/2020 1759    Radiological Exams on Admission: DG Chest 2 View  Result Date: 07/05/2020 CLINICAL DATA:  Shortness of breath on exertion, complained of edema in lower extremities EXAM: CHEST - 2 VIEW COMPARISON:  Chest radiograph May 31, 2020 and chest CT May 31, 2020 FINDINGS: Enlarged cardiac silhouette. Small left pleural effusion. Bibasilar predominant interstitial and airspace opacities. The visualized skeletal structures are unremarkable. IMPRESSION: Cardiomegaly with small left pleural effusion and bibasilar predominant interstitial and airspace opacities. Findings may reflect CHF/pulmonary edema. Electronically Signed   By: Dahlia Bailiff MD   On: 07/05/2020 17:38    EKG: Independently reviewed  Assessment/Plan Acute on chronic diastolic heart failure exacerbation with associated acute hypoxic respiratory failure -place on heart failure protocol -lasix iv bid, strict I/o  daily wts  -wean O2 as able  -CHF education per protocol with noted hx of noncompliance with diet   Uncontrolled HTN -resume home regimen  -iv prn medications ordered  Anemia -noted drop in hgb  -check fob/ anemia panel  -monitor h/h   AKI on CKDIII -due to poor flow related to acute chf exacerbation  -monitor closely on iv diuretic  - hold arb currently   HLD -continue statin   DMII -place on fs /iss -uncontrolled  -ada diet  -last a1c 7.2  DVT prophylaxis: scd Code Status:FuLL Family Communication: none at bedside Disposition Plan:patient  expected to be admitted greater than 2 midnights Consults called: consider cardiology in am  Admission status:inpatient   Clance Boll MD [facility]   If 7PM-7AM, please contact night-coverage [email or web address] Password [facility]  07/05/2020, 10:49 PM

## 2020-07-06 DIAGNOSIS — I5033 Acute on chronic diastolic (congestive) heart failure: Secondary | ICD-10-CM

## 2020-07-06 DIAGNOSIS — I471 Supraventricular tachycardia: Secondary | ICD-10-CM

## 2020-07-06 DIAGNOSIS — E785 Hyperlipidemia, unspecified: Secondary | ICD-10-CM

## 2020-07-06 DIAGNOSIS — I340 Nonrheumatic mitral (valve) insufficiency: Secondary | ICD-10-CM

## 2020-07-06 DIAGNOSIS — I10 Essential (primary) hypertension: Secondary | ICD-10-CM

## 2020-07-06 LAB — BASIC METABOLIC PANEL
Anion gap: 7 (ref 5–15)
BUN: 55 mg/dL — ABNORMAL HIGH (ref 8–23)
CO2: 29 mmol/L (ref 22–32)
Calcium: 8.3 mg/dL — ABNORMAL LOW (ref 8.9–10.3)
Chloride: 105 mmol/L (ref 98–111)
Creatinine, Ser: 2.85 mg/dL — ABNORMAL HIGH (ref 0.61–1.24)
GFR, Estimated: 24 mL/min — ABNORMAL LOW (ref >60)
Glucose, Bld: 377 mg/dL — ABNORMAL HIGH (ref 70–99)
Potassium: 3.9 mmol/L (ref 3.5–5.1)
Sodium: 141 mmol/L (ref 135–145)

## 2020-07-06 LAB — FOLATE: Folate: 6.2 ng/mL (ref >5.9)

## 2020-07-06 LAB — CBG MONITORING, ED: Glucose-Capillary: 319 mg/dL — ABNORMAL HIGH (ref 70–99)

## 2020-07-06 LAB — IRON AND TIBC
Iron: 38 ug/dL — ABNORMAL LOW (ref 45–182)
Saturation Ratios: 17 % — ABNORMAL LOW (ref 17.9–39.5)
TIBC: 228 ug/dL — ABNORMAL LOW (ref 250–450)
UIBC: 190 ug/dL

## 2020-07-06 LAB — SARS CORONAVIRUS 2 (TAT 6-24 HRS): SARS Coronavirus 2: NEGATIVE

## 2020-07-06 LAB — FERRITIN: Ferritin: 61 ng/mL (ref 24–336)

## 2020-07-06 LAB — GLUCOSE, CAPILLARY: Glucose-Capillary: 180 mg/dL — ABNORMAL HIGH (ref 70–99)

## 2020-07-06 LAB — TSH: TSH: 2.949 u[IU]/mL (ref 0.350–4.500)

## 2020-07-06 LAB — MAGNESIUM: Magnesium: 1.9 mg/dL (ref 1.7–2.4)

## 2020-07-06 LAB — VITAMIN B12: Vitamin B-12: 207 pg/mL (ref 180–914)

## 2020-07-06 MED ORDER — LOSARTAN POTASSIUM 50 MG PO TABS
100.0000 mg | ORAL_TABLET | Freq: Every day | ORAL | Status: DC
  Administered 2020-07-06 – 2020-07-12 (×7): 100 mg via ORAL
  Filled 2020-07-06 (×7): qty 2

## 2020-07-06 MED ORDER — INSULIN ASPART 100 UNIT/ML ~~LOC~~ SOLN
0.0000 [IU] | Freq: Three times a day (TID) | SUBCUTANEOUS | Status: DC
  Administered 2020-07-06: 3 [IU] via SUBCUTANEOUS
  Administered 2020-07-06: 11 [IU] via SUBCUTANEOUS
  Administered 2020-07-07 (×2): 3 [IU] via SUBCUTANEOUS
  Administered 2020-07-07: 2 [IU] via SUBCUTANEOUS
  Administered 2020-07-08 – 2020-07-09 (×4): 3 [IU] via SUBCUTANEOUS
  Administered 2020-07-09: 2 [IU] via SUBCUTANEOUS
  Administered 2020-07-09: 5 [IU] via SUBCUTANEOUS
  Administered 2020-07-10: 3 [IU] via SUBCUTANEOUS
  Administered 2020-07-10 (×2): 2 [IU] via SUBCUTANEOUS
  Administered 2020-07-11: 5 [IU] via SUBCUTANEOUS
  Administered 2020-07-11 – 2020-07-12 (×2): 3 [IU] via SUBCUTANEOUS
  Administered 2020-07-12: 2 [IU] via SUBCUTANEOUS
  Filled 2020-07-06 (×17): qty 1

## 2020-07-06 MED ORDER — FUROSEMIDE 10 MG/ML IJ SOLN
40.0000 mg | Freq: Two times a day (BID) | INTRAMUSCULAR | Status: DC
  Administered 2020-07-06: 40 mg via INTRAVENOUS
  Filled 2020-07-06: qty 4

## 2020-07-06 MED ORDER — INSULIN GLARGINE 100 UNIT/ML ~~LOC~~ SOLN
10.0000 [IU] | Freq: Every day | SUBCUTANEOUS | Status: DC
  Administered 2020-07-06 – 2020-07-12 (×7): 10 [IU] via SUBCUTANEOUS
  Filled 2020-07-06 (×8): qty 0.1

## 2020-07-06 MED ORDER — FUROSEMIDE 10 MG/ML IJ SOLN
80.0000 mg | Freq: Two times a day (BID) | INTRAMUSCULAR | Status: DC
  Administered 2020-07-07 – 2020-07-11 (×9): 80 mg via INTRAVENOUS
  Filled 2020-07-06 (×10): qty 8

## 2020-07-06 MED ORDER — FUROSEMIDE 10 MG/ML IJ SOLN: 80.0000 mg | Freq: Two times a day (BID) | INTRAMUSCULAR | Status: DC

## 2020-07-06 MED ORDER — ENSURE MAX PROTEIN PO LIQD
11.0000 [oz_av] | Freq: Two times a day (BID) | ORAL | Status: DC
  Administered 2020-07-07: 11 [oz_av] via ORAL
  Filled 2020-07-06: qty 330

## 2020-07-06 MED ORDER — FUROSEMIDE 10 MG/ML IJ SOLN: 60.0000 mg | Freq: Two times a day (BID) | INTRAMUSCULAR | Status: DC

## 2020-07-06 MED ORDER — HEPARIN SODIUM (PORCINE) 5000 UNIT/ML IJ SOLN
5000.0000 [IU] | Freq: Three times a day (TID) | INTRAMUSCULAR | Status: DC
  Administered 2020-07-06 – 2020-07-11 (×16): 5000 [IU] via SUBCUTANEOUS
  Filled 2020-07-06 (×16): qty 1

## 2020-07-06 NOTE — ED Notes (Signed)
hospitalist in with pt

## 2020-07-06 NOTE — ED Notes (Signed)
Pt voided 400 cc urine.

## 2020-07-06 NOTE — ED Notes (Signed)
Pt sitting up on stretcher.  nsr on monitor.  Iv's in place.  Pt waiting on admission.

## 2020-07-06 NOTE — Progress Notes (Signed)
PT Cancellation Note  Patient Details Name: Victor Hernandez MRN: [id number] DOB: 1955-11-11   Cancelled Treatment:    Reason Eval/Treat Not Completed: Patient declined, no reason specified (Patient consult received and reviewed. Patient aware that PT was coming however still refused PT as he was eating lunch and wanted to finish. Will attempt again at later time/date as available.)  Janna Arch, PT, DPT   07/06/2020, 11:59 AM

## 2020-07-06 NOTE — ED Notes (Signed)
Pt sitting on side of bed states he is not comfortable laying on bed. Recliner in room, pt sitting up in recliner comfortably. Denies any shob or cp at this time. Monitor in place, will continue to monitor.

## 2020-07-06 NOTE — ED Notes (Signed)
Spoke with Dr. Damita Dunnings about abnormal rhythm strip, will continue with orders.

## 2020-07-06 NOTE — ED Notes (Signed)
Breakfast tray placed at bedside.  

## 2020-07-06 NOTE — Plan of Care (Signed)
  Problem: Education: Goal: Knowledge of General Education information will improve Description: Including pain rating scale, medication(s)/side effects and non-pharmacologic comfort measures Outcome: Progressing   Problem: Health Behavior/Discharge Planning: Goal: Ability to manage health-related needs will improve Outcome: Progressing   Problem: Clinical Measurements: Goal: Diagnostic test results will improve Outcome: Progressing Goal: Respiratory complications will improve Outcome: Progressing   Problem: Nutrition: Goal: Adequate nutrition will be maintained Outcome: Progressing

## 2020-07-06 NOTE — Plan of Care (Signed)
Nutrition Education Note  RD consulted for nutrition education regarding CHF.  65 y.o. male with medical history significant of hypertension, type 2 diabetes mellitus, CHF with Pef, CKD IV and morbid obesity who presents to ED with complaint of shortness of breath and progressive swelling of lower extremities and scrotum.  RD provided "Low Sodium Nutrition Therapy" handout from the Academy of [employer]. Reviewed patient's dietary recall. Provided examples on ways to decrease sodium intake in diet. Discouraged intake of processed foods and use of salt shaker. Encouraged fresh fruits and vegetables as well as whole grain sources of carbohydrates to maximize fiber intake.   RD discussed why it is important for patient to adhere to diet recommendations, and emphasized the role of fluids, foods to avoid, and importance of weighing self daily. Teach back method used.  Expect fair compliance.  Body mass index is 41.85 kg/m. Pt meets criteria for morbid obesity based on current BMI.  Current diet order is HH/CHO modified, patient is consuming approximately 100% of meals at this time. Labs and medications reviewed. No further nutrition interventions warranted at this time. RD contact information provided. If additional nutrition issues arise, please re-consult RD.   Koleen Distance MS, RD, LDN Please refer to [facility] for RD and/or RD on-call/weekend/after hours pager

## 2020-07-06 NOTE — Consult Note (Addendum)
   Heart Failure Nurse Navigator Note  HFpEF-50-55%.  Mild LVH.  Normal right ventricular systolic function.  Moderate left atrial enlargement.  Moderate to severe mitral regurgitation.  Mild tricuspid regurgitation.   He presented to the emergency room with several weeks of shortness of breath, lower extremity edema extending to scrotum, dyspnea on exertion, orthopnea and PND.  Was last hospitalized January 3-5 with hypertension and encephalopathy.  He was no-show to his appointments at the heart failure clinic on January 12 and January 31.  Chest x-ray revealed cardiomegaly with small left pleural effusion.  Bilateral interstitial and airway opacities.  Comorbidities:  Hyperlipidemia Hypertension Diabetes type 2 Morbid obesity Chronic kidney disease stage IV Anemia  Labs:  Sodium 141 potassium 3.9, chloride 105, CO2 29, BUN 55, creatinine 2.85 was 2.04 yesterday, BNP 866, magnesium 1.9. EKG is a sinus tachycardia with no acute abnormalities. Blood pressure 114/75 Weight 104.3 kg BMI 37.12    Assessment:  General-he is awake and alert sitting up in the chair at bedside in no acute distress.  HEENT-sclerae nonicteric, no JVD.  Cardiac-heart tones of regular rate and rhythm with systolic murmur.  Chest-breath sounds are clear to posterior auscultation diminished left base.  Abdomen-distended, rounded  nontender.  Musculoskeletal-woody in appearance, edematous  Psych-is pleasant and appropriate makes good eye contact.  Neurologic-speech is clear, moves all extremities without difficulties.    Initial visit with patient in the ED.  He states that he lives by himself and continues to work as a Development worker, international aid.  He states at times he is able to pick up debris from yards and not have any problem and then other times when he bends over he is short of breath.  On questioning he states that he takes his medications as they were prescribed but when I asked him if he took a water  pill he is was unsure.  He states that he sets his medications up in a daily medication box.  Discussing diet he admits to eating very little at home and eats mostly in restaurants.  He states that he does not add salt to his meals.  He also states that he was instructed by his physician to drink plenty of fluids.  He has a scale but does not weigh himself on a daily basis.  Discussed  the importance of weighing daily, recording and reporting weight gains.  He was given the living with heart failure teaching booklet along with his zone magnet.  Discussed goals that he will understand medications that he is taking at time of discharge, understand the importance of weighing daily and eating a heart healthy diet.  He voices understanding.  Pricilla Riffle RN [facility]

## 2020-07-06 NOTE — ED Notes (Signed)
Pt resting with eyes closed, will continue to monitor.

## 2020-07-06 NOTE — ED Notes (Signed)
Monitor alarms with run of vtach, pt asymptomatic and resting comfortably. When asked if any discomfort pt denies. Will contact Dr. Damita Dunnings for further orders.

## 2020-07-06 NOTE — Consult Note (Signed)
Cardiology Consultation:   Patient ID: Victor Hernandez MRN: [id number]; DOB: 1956-03-09  Admit date: 07/05/2020 Date of Consult: 07/06/2020  Primary Care Provider: Remi Haggard, FNP [facility] Cardiologist:[facility], Dr. Saunders Revel rounding [facility] Electrophysiologist:  None    Patient Profile:   Victor Hernandez is a 65 y.o. male with a hx of moderate / severe mitral valve regurgitation (05/2020), HFpEF (EF 50-55%, 05/2020), poorly controlled hypertension, HLD, DM2, CKD 4 (Baseline Cr ~2.4), obesity, remote history of alcohol and tobacco use, who is being seen today for the evaluation of acute on chronic heart failure with preserved ejection fraction at the request of Dr. Si Raider.  History of Present Illness:   Victor Hernandez is a 64 year old male with PMH as above.  He lives alone after his wife passed away some time ago. He has not previously been seen by [facility] cardiology in the clinic.    He does not have a nephrologist. No regular cardiologist.  He reports family history of father, deceased from MI at age~ 9 yo. He reports past history of alcohol, quitting 2 years ago  He has remote history of tobacco use, quitting approximately 40 years ago.  He denies any current drug use.  He was recently admitted 1/3-1/5 with hypertensive crisis with CT imaging showing diffuse coronary atherosclerosis and newly diagnosed HFpEF.  CT that admission showed bilateral pleural effusions, cardiomegaly with trace pericardial effusion, diffuse body wall edema, and extensive coronary artery atherosclerosis.  Subsequent 05/2020 Echo (read by an outside cardiology group) showed low normal EF 50 to 55%, NRWMA, moderate to severe MR, moderate LVH, and findings as below.  Renal ultrasound showed findings consistent with medical renal disease and small left pleural effusion.  He was IV diuresed and discharged to follow-up with the heart failure clinic.  He was then a no-show to this visit.   He reports drinking large amounts  of fluid and eating a high salt diet since that time.  He states that he enjoys seasonings and ribs, as well as is chronically thirsty. He states his doctor told him to drink a lot of fluid. He has not been taking his medications. Since his last admission, he has noticed bilateral lower extremity edema that has progressed and extended up to his scrotum.  He also has noticed shortness of breath that is made worse with exertion.  Due to his edema, he has noticed unsteadiness on his feet.  No chest pain.  At times, he feels his heart race but denies any palpitations or dizziness associated with this racing heart rate.  No loss of consciousness.  He notes orthopnea and PND, having to sleep at a 85 degree angle at night.  He has also noticed increasing abdominal distention.  No recent fever or chills. Given his worsening symptoms, he decided to present to his doctor, who subsequently sent him to [facility] ED 07/05/20.    In the ED, initial vitals significant for HTN 161/115 and tachycardia at 116 bpm.  He denies using oxygen at home with SPO2 96% on room air.  Initial labs significant for creatinine 2.94 (?baseline Cr 2.3-2.4), BUN 59, BNP 866.4, hemoglobin 11.5, hematocrit 37.2, TSH 2.949. CXR showed small L pleural effusion and findings that might reflect edema.      Past Medical History:  Diagnosis Date  . Diabetes mellitus without complication ([place])   . Hyperlipidemia   . Hypertension     Past Surgical History:  Procedure Laterality Date  . COLONOSCOPY    .  COLONOSCOPY WITH PROPOFOL N/A 05/19/2016   Procedure: COLONOSCOPY WITH PROPOFOL;  Surgeon: Christene Lye, MD;  Location: [facility] ENDOSCOPY;  Service: Endoscopy;  Laterality: N/A;     Home Medications:  Prior to Admission medications   Medication Sig Start Date End Date Taking? Authorizing Provider  amLODipine (NORVASC) 10 MG tablet Take 1 tablet (10 mg total) by mouth daily. 06/03/20   Jennye Boroughs, MD  aspirin EC 81 MG tablet Take 81 mg by  mouth daily.    [provider]  atorvastatin (LIPITOR) 40 MG tablet Take 40 mg by mouth at bedtime. 02/24/20   [provider]  blood glucose meter kit and supplies Dispense based on patient and insurance preference. Use up to four times daily as directed. (FOR ICD-10 E10.9, E11.9). 06/02/20   Jennye Boroughs, MD  furosemide (LASIX) 40 MG tablet Take 1 tablet (40 mg total) by mouth daily. 06/02/20   Jennye Boroughs, MD  KLOR-CON M20 20 MEQ tablet Take 1 tablet (20 mEq total) by mouth daily. 06/02/20   Jennye Boroughs, MD  losartan (COZAAR) 100 MG tablet Take 1 tablet (100 mg total) by mouth daily. 06/02/20   Jennye Boroughs, MD  metoprolol tartrate (LOPRESSOR) 50 MG tablet Take 1 tablet (50 mg total) by mouth 2 (two) times daily. 06/02/20   Jennye Boroughs, MD    Inpatient Medications: Scheduled Meds: . amLODipine  10 mg Oral Daily  . aspirin EC  81 mg Oral Daily  . atorvastatin  40 mg Oral QHS  . furosemide  40 mg Intravenous Q12H  . heparin  5,000 Units Subcutaneous Q8H  . insulin aspart  0-15 Units Subcutaneous TID WC  . insulin glargine  10 Units Subcutaneous Daily  . losartan  100 mg Oral Daily  . metoprolol tartrate  50 mg Oral BID  . sodium chloride flush  3 mL Intravenous Q12H   Continuous Infusions: . sodium chloride     PRN Meds: sodium chloride, acetaminophen, ondansetron (ZOFRAN) IV, sodium chloride flush  Allergies:   No Known Allergies  Social History:   Social History   Socioeconomic History  . Marital status: Single    Spouse name: Not on file  . Number of children: Not on file  . Years of education: Not on file  . Highest education level: Not on file  Occupational History  . Not on file  Tobacco Use  . Smoking status: Never Smoker  . Smokeless tobacco: Never Used  Substance and Sexual Activity  . Alcohol use: No  . Drug use: No  . Sexual activity: Not on file  Other Topics Concern  . Not on file  Social History Narrative  . Not on file   Social  Determinants of Health   Financial Resource Strain: Not on file  Food Insecurity: Not on file  Transportation Needs: Not on file  Physical Activity: Not on file  Stress: Not on file  Social Connections: Not on file  Intimate Partner Violence: Not on file    Family History:   History reviewed. No pertinent family history.  Father, deceased from MI in his 80s  ROS:  Please see the history of present illness.  Review of Systems  Constitutional: Positive for malaise/fatigue.  Respiratory: Positive for shortness of breath. Negative for hemoptysis.   Cardiovascular: Positive for orthopnea, leg swelling and PND. Negative for chest pain and palpitations.       Racing HR  Gastrointestinal: Negative for blood in stool and melena.  Musculoskeletal: Negative for falls.  Neurological: Positive for weakness. Negative for dizziness, focal weakness and loss of consciousness.  All other systems reviewed and are negative.   All other ROS reviewed and negative.     Physical Exam/Data:   Vitals:   07/06/20 0500 07/06/20 0600 07/06/20 0700 07/06/20 0800  BP: 140/84 139/85 (!) 143/91 124/81  Pulse: (!) 108 67 (!) 108 65  Resp: '19 18 12 19  ' Temp:      TempSrc:      SpO2: 100% 100% 94% 99%  Weight:      Height:        Intake/Output Summary (Last 24 hours) at 07/06/2020 1003 Last data filed at 07/06/2020 0904 Gross per 24 hour  Intake --  Output 800 ml  Net -800 ml   Last 3 Weights 07/05/2020 06/02/2020 05/31/2020  Weight (lbs) 230 lb 253 lb 240 lb  Weight (kg) 104.327 kg 114.76 kg 108.863 kg     Body mass index is 37.12 kg/m.  General: Obese male, no acute distress, seated in chair next to bed HEENT: normal Lymph: no adenopathy Neck: JVD difficult to assess due to body habitus Endocrine:  No thryomegaly Vascular: No carotid bruits; FA pulses 2+ bilaterally without bruits  Cardiac:  normal S1, S2; tachycardic; 3/6 blowing holosystolic murmur appreciated along the left lower sternal border /  apex Lungs: Reduced breath sounds bilaterally  Abd: Firm, distended Ext: Woody bilateral edema extending up the thigh and to the level of the scrotum and abdomen Musculoskeletal:  No deformities, BUE and BLE strength normal and equal Skin: warm and dry  Neuro:  CNs 2-12 intact, no focal abnormalities noted Psych:  Normal affect   EKG:  The EKG was personally reviewed and demonstrates:  ST, 114bpm Telemetry:  Telemetry was personally reviewed and demonstrates:  Telemetry has shown sinus tachycardia, 6:13 AM 6 beat run of NSVT with remainder of alerts corresponding to artifact, ectopy / PVCs, short runs of AT.  Relevant CV Studies: Echo 05/2019 1. Left ventricular ejection fraction, by estimation, is 50 to 55%. The  left ventricle has low normal function. The left ventricle has no regional  wall motion abnormalities. There is moderate left ventricular hypertrophy.  Left ventricular diastolic  parameters were normal.  2. Right ventricular systolic function is normal. The right ventricular  size is normal.  3. Left atrial size was moderately dilated.  4. The mitral valve is abnormal. Moderate to severe mitral valve  regurgitation.  5. The aortic valve is normal in structure. Aortic valve regurgitation is  not visualized.   Laboratory Data:  High Sensitivity Troponin:  No results for input(s): TROPONINIHS in the last 720 hours.   Chemistry Recent Labs  Lab 07/05/20 1709 07/06/20 0459  NA 141 141  K 3.9 3.9  CL 104 105  CO2 27 29  GLUCOSE 388* 377*  BUN 59* 55*  CREATININE 2.94* 2.85*  CALCIUM 8.3* 8.3*  GFRNONAA 23* 24*  ANIONGAP 10 7    No results for input(s): PROT, ALBUMIN, AST, ALT, ALKPHOS, BILITOT in the last 168 hours. Hematology Recent Labs  Lab 07/05/20 1709 07/05/20 2322  WBC 8.5  --   RBC 3.98* 3.69*  HGB 11.5*  --   HCT 37.2*  --   MCV 93.5  --   MCH 28.9  --   MCHC 30.9  --   RDW 14.1  --   PLT 262  --    BNP Recent Labs  Lab 07/05/20 1709   BNP 866.4*  DDimer No results for input(s): DDIMER in the last 168 hours.   Radiology/Studies:  DG Chest 2 View  Result Date: 07/05/2020 CLINICAL DATA:  Shortness of breath on exertion, complained of edema in lower extremities EXAM: CHEST - 2 VIEW COMPARISON:  Chest radiograph May 31, 2020 and chest CT May 31, 2020 FINDINGS: Enlarged cardiac silhouette. Small left pleural effusion. Bibasilar predominant interstitial and airspace opacities. The visualized skeletal structures are unremarkable. IMPRESSION: Cardiomegaly with small left pleural effusion and bibasilar predominant interstitial and airspace opacities. Findings may reflect CHF/pulmonary edema. Electronically Signed   By: Dahlia Bailiff MD   On: 07/05/2020 17:38     Assessment and Plan:   Acute on chronic HFpEF --NYHA class III symptoms.  Progressive symptoms with report of increased fluid intake per MD instructions and no limitation of salt.  Previous echo above with EF 50 to 55%, read by an outside cardiologist at a previous admission.  He was discharged on furosemide 40 mg daily with only follow-up in the heart failure clinic. --Volume overloaded on exam. Continue IV diuresis for net -2 L daily as renal function allows.  Continue to monitor I's/O's, daily standing weights.  Consider that end-stage renal disease may be contributing to his current volume status and consider consulting nephrology as well. --Daily BMET.  Maintain electrolytes at goal. --Ideally, further workup of reduced EF would be performed. Given renal function, he is not an ideal candidate for cath (given contrast exposure with Cr over 2). Consider furture RHC, however, and if current wt allows to better understand his hemodynamics and for a closer look at his mitral valve regurgitation. Given his body habitus, MPI images will likely be sub-optimal and he may meet wt limitations for cardiac CT. Recommend both rate and BP control.  Continue continue Lopressor 50  mg twice daily and escalate as tolerated with plan for consolidation prior to discharge. Would hold Losartan pending discussion with nephrology.  Consider cardiac monitoring at discharge as below.  HTN --Most recent BP improved at 133/85.  Continue current medications and titrate as needed for BP support.  Continue diuresis.  Previous renal ultrasound as above.  Moderate to severe mitral regurgitation  --As seen on previous 05/2020 echo.  Consider as contributing to current presentation.  As above, he may benefit from a right heart cath in the near future.  We will need to closely monitor going forward.  NSVT, sinus tachycardia, ectopy --Reports occasional racing heart rate.  Most of the NSVT alerts on telemetry are artifact, with the exception of one short run of NSVT at 6:13 AM and lasting only 6 beats.  Can consider cardiac monitoring with 2-week Zio XT at discharge.  DM2 --SSI, per IM.  HLD --Continue PTA statin.  CKD4 --Baseline creatinine approximately 2.4.  Consider nephrology consultation.  Daily BMET.  Avoid nephrotoxins.          New York Heart Association (NYHA) Functional Class NYHA Class III     For questions or updates, please contact [facility] Please consult [email or web address] for contact info under    Signed, Arvil Chaco, PA-C  07/06/2020 10:03 AM

## 2020-07-06 NOTE — Progress Notes (Signed)
PROGRESS NOTE    Victor Hernandez  SWF:[id number] DOB: 07/10/1955 DOA: 07/05/2020 PCP: Remi Haggard, FNP  Outpatient Specialists: none    Brief Narrative:   From admission hpi Victor Hernandez is a 65 y.o. male with medical history significant of  hypertension, type 2 diabetes mellitus,CHF with Pef,CKDIV, morbid obesity who presents to ED with complaint of shortness of breath and progressive swelling of lower extremities and scrotum. Per chart patient has heart failure clinic follow up on 1/31 but was a no show for this appointment. Patient also has interim history of recent admission  1/3-06/02/2020 with diagnosis of Hypertensive crisis with associated hypertensive encephalopathy and acute on chronic diastolic heart failure exacerbation with associated acute hypoxic respiratory failure , patient was treated with lasix iv as well as IV hypertensive with improvement in symptoms with discharge with follow with Heart failure clinic for which he was a no show. Currently patient in addition to have above complaints notes DOE, orthopnea,and PND. He notes no associated chest pain, n/v/d/cough /fever/chills/ uri symptoms,dysuria or abdominal pain.He also noted that he has note being on a fluid or salt restriction. He also notes as an out patient he has had his diuretic increased but noted no change in his swelling. He also states he has been complaint with his medications.   Assessment & Plan:   Active Problems:   CHF (congestive heart failure) (HCC)  # Acute on chronic diastolic heart failure exacerbation  # Acute hypoxic respiratory failure # V tach # Mitral regurg O2 low 80s on presentation, now normal on 2 L. bnp elevated, sig LE edema, CXR with vascular congestion. No chest pain or ischemic changes seen on ekg. Several runs of asymptoamtic v tach overnight. Normal EF on TTE earlier this month, mod to severe mitral regurg on that tte - place on heart failure protocol - lasix 40 iv bid for home 40  qd - wean O2 as able  - cardiology consult  # HTN Hx poor control. Currently wnl - cont home metoprolol, amlodipine, losartan  # Anemia H 11.5 from previous normal. Admission labs suggestive of chronic disease. No report of melena/hematochezia - monitor  AKI on CKD4 Resolved, cr this morning 2.85 which is essentially his baseline - monitor  HLD -continue statin   DMII Insulin stopped at last hospitalization. Glucose 300s here - start lantus 10 qd, SSI   DVT prophylaxis: heparin Code Status: full Family Communication: none @ bedside  Level of care: Progressive Cardiac Status is: Inpatient  Remains inpatient appropriate because:Inpatient level of care appropriate due to severity of illness   Dispo: The patient is from: Home              Anticipated d/c is to: Home              Anticipated d/c date is: 3 days              Patient currently is not medically stable to d/c.   Difficult to place patient No        Consultants:  cardiology  Procedures: none  Antimicrobials:  none    Subjective: This morning says breathing improved, still complains of significant lower extremity swelling. No chest pain or fever, tolerating diet.  Objective: Vitals:   07/06/20 0500 07/06/20 0600 07/06/20 0700 07/06/20 0800  BP: 140/84 139/85 (!) 143/91 124/81  Pulse: (!) 108 67 (!) 108 65  Resp: 19 18 12 19   Temp:      TempSrc:  SpO2: 100% 100% 94% 99%  Weight:      Height:        Intake/Output Summary (Last 24 hours) at 07/06/2020 0932 Last data filed at 07/06/2020 3557 Gross per 24 hour  Intake --  Output 800 ml  Net -800 ml   Filed Weights   07/05/20 1704  Weight: 104.3 kg    Examination:  General exam: Appears calm and comfortable  Respiratory system: scattered exp wheeze Cardiovascular system: S1 & S2 heard, distant heart sounds. Edema to abdomen Gastrointestinal system: Abdomen is obese, soft and nontender. No organomegaly or masses felt. Normal  bowel sounds heard. Central nervous system: Alert and oriented. No focal neurological deficits. Extremities: Symmetric 5 x 5 power. Skin: edema to abdomen. Lichenification of LEs Psychiatry: Judgement and insight appear normal. Mood & affect appropriate.     Data Reviewed: I have personally reviewed following labs and imaging studies  CBC: Recent Labs  Lab 07/05/20 1709  WBC 8.5  HGB 11.5*  HCT 37.2*  MCV 93.5  PLT 322   Basic Metabolic Panel: Recent Labs  Lab 07/05/20 1709 07/06/20 0459  NA 141 141  K 3.9 3.9  CL 104 105  CO2 27 29  GLUCOSE 388* 377*  BUN 59* 55*  CREATININE 2.94* 2.85*  CALCIUM 8.3* 8.3*  MG  --  1.9   GFR: Estimated Creatinine Clearance: 28.8 mL/min (A) (by C-G formula based on SCr of 2.85 mg/dL (H)). Liver Function Tests: No results for input(s): AST, ALT, ALKPHOS, BILITOT, PROT, ALBUMIN in the last 168 hours. No results for input(s): LIPASE, AMYLASE in the last 168 hours. No results for input(s): AMMONIA in the last 168 hours. Coagulation Profile: No results for input(s): INR, PROTIME in the last 168 hours. Cardiac Enzymes: No results for input(s): CKTOTAL, CKMB, CKMBINDEX, TROPONINI in the last 168 hours. BNP (last 3 results) No results for input(s): PROBNP in the last 8760 hours. HbA1C: No results for input(s): HGBA1C in the last 72 hours. CBG: No results for input(s): GLUCAP in the last 168 hours. Lipid Profile: No results for input(s): CHOL, HDL, LDLCALC, TRIG, CHOLHDL, LDLDIRECT in the last 72 hours. Thyroid Function Tests: Recent Labs    07/05/20 1709  TSH 2.949   Anemia Panel: Recent Labs    07/05/20 1709 07/05/20 2322  VITAMINB12  --  207  FOLATE 6.2  --   FERRITIN 61  --   TIBC 228*  --   IRON 38*  --   RETICCTPCT  --  2.7   Urine analysis:    Component Value Date/Time   COLORURINE YELLOW (A) 06/01/2020 1759   APPEARANCEUR CLEAR (A) 06/01/2020 1759   LABSPEC 1.010 06/01/2020 1759   PHURINE 5.0 06/01/2020 1759    GLUCOSEU 50 (A) 06/01/2020 1759   HGBUR MODERATE (A) 06/01/2020 1759   BILIRUBINUR NEGATIVE 06/01/2020 1759   KETONESUR NEGATIVE 06/01/2020 1759   PROTEINUR >=300 (A) 06/01/2020 1759   NITRITE NEGATIVE 06/01/2020 1759   LEUKOCYTESUR NEGATIVE 06/01/2020 1759   Sepsis Labs: @LABRCNTIP (procalcitonin:4,lacticidven:4)  ) Recent Results (from the past 240 hour(s))  SARS CORONAVIRUS 2 (TAT 6-24 HRS) Nasopharyngeal Nasopharyngeal Swab     Status: None   Collection Time: 07/05/20 11:21 PM   Specimen: Nasopharyngeal Swab  Result Value Ref Range Status   SARS Coronavirus 2 NEGATIVE NEGATIVE Final    Comment: (NOTE) SARS-CoV-2 target nucleic acids are NOT DETECTED.  The SARS-CoV-2 RNA is generally detectable in upper and lower respiratory specimens during the acute phase of infection.  Negative results do not preclude SARS-CoV-2 infection, do not rule out co-infections with other pathogens, and should not be used as the sole basis for treatment or other patient management decisions. Negative results must be combined with clinical observations, patient history, and epidemiological information. The expected result is Negative.  Fact Sheet for Patients: SugarRoll.be  Fact Sheet for Healthcare Providers: https://www.woods-mathews.com/  This test is not yet approved or cleared by the [place] FDA and  has been authorized for detection and/or diagnosis of SARS-CoV-2 by FDA under an Emergency Use Authorization (EUA). This EUA will remain  in effect (meaning this test can be used) for the duration of the COVID-19 declaration under Se ction 564(b)(1) of the Act, 21 U.S.C. section 360bbb-3(b)(1), unless the authorization is terminated or revoked sooner.  Performed at [facility], [place] [address], [place], [place] [address]          Radiology Studies: DG Chest 2 View  Result Date: 07/05/2020 CLINICAL DATA:  Shortness of breath on  exertion, complained of edema in lower extremities EXAM: CHEST - 2 VIEW COMPARISON:  Chest radiograph May 31, 2020 and chest CT May 31, 2020 FINDINGS: Enlarged cardiac silhouette. Small left pleural effusion. Bibasilar predominant interstitial and airspace opacities. The visualized skeletal structures are unremarkable. IMPRESSION: Cardiomegaly with small left pleural effusion and bibasilar predominant interstitial and airspace opacities. Findings may reflect CHF/pulmonary edema. Electronically Signed   By: Dahlia Bailiff MD   On: 07/05/2020 17:38        Scheduled Meds: . amLODipine  10 mg Oral Daily  . aspirin EC  81 mg Oral Daily  . atorvastatin  40 mg Oral QHS  . furosemide  80 mg Intravenous Q12H  . metoprolol tartrate  50 mg Oral BID  . sodium chloride flush  3 mL Intravenous Q12H   Continuous Infusions: . sodium chloride       LOS: 1 day    Time spent: 88 min    Desma Maxim, MD [facility]   If 7PM-7AM, please contact night-coverage [email or web address] Password [email or web address] 07/06/2020, 9:05 AM

## 2020-07-06 NOTE — ED Notes (Signed)
Report received from [facility] RN. Patient care assumed. Patient/RN introduction complete. Will continue to monitor.

## 2020-07-06 NOTE — ED Notes (Signed)
Pt resting comfortably with eyes closed, will continue to monitor.  

## 2020-07-07 DIAGNOSIS — R0602 Shortness of breath: Secondary | ICD-10-CM

## 2020-07-07 LAB — GLUCOSE, CAPILLARY
Glucose-Capillary: 158 mg/dL — ABNORMAL HIGH (ref 70–99)
Glucose-Capillary: 130 mg/dL — ABNORMAL HIGH (ref 70–99)
Glucose-Capillary: 153 mg/dL — ABNORMAL HIGH (ref 70–99)
Glucose-Capillary: 210 mg/dL — ABNORMAL HIGH (ref 70–99)

## 2020-07-07 LAB — CBC
HCT: 35.4 % — ABNORMAL LOW (ref 39.0–52.0)
Hemoglobin: 10.9 g/dL — ABNORMAL LOW (ref 13.0–17.0)
MCH: 28.8 pg (ref 26.0–34.0)
MCHC: 30.8 g/dL (ref 30.0–36.0)
MCV: 93.4 fL (ref 80.0–100.0)
Platelets: 262 10*3/uL (ref 150–400)
RBC: 3.79 MIL/uL — ABNORMAL LOW (ref 4.22–5.81)
RDW: 14.5 % (ref 11.5–15.5)
WBC: 6.4 10*3/uL (ref 4.0–10.5)
nRBC: 0 % (ref 0.0–0.2)

## 2020-07-07 LAB — BASIC METABOLIC PANEL
Anion gap: 10 (ref 5–15)
BUN: 50 mg/dL — ABNORMAL HIGH (ref 8–23)
CO2: 27 mmol/L (ref 22–32)
Calcium: 8.4 mg/dL — ABNORMAL LOW (ref 8.9–10.3)
Chloride: 106 mmol/L (ref 98–111)
Creatinine, Ser: 2.6 mg/dL — ABNORMAL HIGH (ref 0.61–1.24)
GFR, Estimated: 26 mL/min — ABNORMAL LOW (ref >60)
Glucose, Bld: 95 mg/dL (ref 70–99)
Potassium: 3.6 mmol/L (ref 3.5–5.1)
Sodium: 143 mmol/L (ref 135–145)

## 2020-07-07 MED ORDER — METOLAZONE 5 MG PO TABS
5.0000 mg | ORAL_TABLET | Freq: Every day | ORAL | Status: DC
  Administered 2020-07-07 – 2020-07-11 (×5): 5 mg via ORAL
  Filled 2020-07-07 (×6): qty 1

## 2020-07-07 MED ORDER — AMLODIPINE BESYLATE 5 MG PO TABS
5.0000 mg | ORAL_TABLET | Freq: Every day | ORAL | Status: DC
  Administered 2020-07-07 – 2020-07-08 (×2): 5 mg via ORAL
  Filled 2020-07-07 (×2): qty 1

## 2020-07-07 NOTE — Progress Notes (Signed)
Progress Note  Patient Name: Victor Hernandez Date of Encounter: 07/07/2020  [facility] Cardiologist: New- Dr. Saunders Revel  Subjective   Shortness of breath and swelling is improved compared to yesterday.  Still has a lot of scrotal swelling, has not been saving/recording his urine.  Inpatient Medications    Scheduled Meds: . amLODipine  5 mg Oral Daily  . aspirin EC  81 mg Oral Daily  . atorvastatin  40 mg Oral QHS  . furosemide  80 mg Intravenous BID  . heparin  5,000 Units Subcutaneous Q8H  . insulin aspart  0-15 Units Subcutaneous TID WC  . insulin glargine  10 Units Subcutaneous Daily  . losartan  100 mg Oral Daily  . metolazone  5 mg Oral Daily  . metoprolol tartrate  50 mg Oral BID  . Ensure Max Protein  11 oz Oral BID  . sodium chloride flush  3 mL Intravenous Q12H   Continuous Infusions: . sodium chloride     PRN Meds: sodium chloride, acetaminophen, ondansetron (ZOFRAN) IV, sodium chloride flush   Vital Signs    Vitals:   07/06/20 2038 07/07/20 0301 07/07/20 0810 07/07/20 1104  BP: (!) 145/88 118/79 (!) 131/96 133/83  Pulse:  (!) 102 91 (!) 105  Resp: 20 20 16 19   Temp: 98 F (36.7 C) 98.4 F (36.9 C) 98 F (36.7 C) 97.9 F (36.6 C)  TempSrc: Oral Oral Oral   SpO2: 100% 96% 98% 97%  Weight:  123 kg    Height:        Intake/Output Summary (Last 24 hours) at 07/07/2020 1239 Last data filed at 07/06/2020 1820 Gross per 24 hour  Intake 600 ml  Output 300 ml  Net 300 ml   Last 3 Weights 07/07/2020 07/06/2020 07/05/2020  Weight (lbs) 271 lb 2.7 oz 259 lb 4.8 oz 230 lb  Weight (kg) 123 kg 117.618 kg 104.327 kg      Telemetry    Off telemetry currently- Personally Reviewed  ECG    No new tracing noted- Personally Reviewed  Physical Exam   GEN: No acute distress.   Neck:  Difficult to assess Cardiac: RRR, 2/6 systolic murmur at the apex.  Respiratory:  Decreased breath sounds bilaterally GI:  Distended abdomen, soft MS: No edema;  Neuro:  Nonfocal   Psych: Normal affect   Labs    High Sensitivity Troponin:  No results for input(s): TROPONINIHS in the last 720 hours.    Chemistry Recent Labs  Lab 07/05/20 1709 07/06/20 0459 07/07/20 0516  NA 141 141 143  K 3.9 3.9 3.6  CL 104 105 106  CO2 27 29 27   GLUCOSE 388* 377* 95  BUN 59* 55* 50*  CREATININE 2.94* 2.85* 2.60*  CALCIUM 8.3* 8.3* 8.4*  GFRNONAA 23* 24* 26*  ANIONGAP 10 7 10      Hematology Recent Labs  Lab 07/05/20 1709 07/05/20 2322 07/07/20 0516  WBC 8.5  --  6.4  RBC 3.98* 3.69* 3.79*  HGB 11.5*  --  10.9*  HCT 37.2*  --  35.4*  MCV 93.5  --  93.4  MCH 28.9  --  28.8  MCHC 30.9  --  30.8  RDW 14.1  --  14.5  PLT 262  --  262    BNP Recent Labs  Lab 07/05/20 1709  BNP 866.4*     DDimer No results for input(s): DDIMER in the last 168 hours.   Radiology    DG Chest 2 View  Result Date: 07/05/2020 CLINICAL DATA:  Shortness of breath on exertion, complained of edema in lower extremities EXAM: CHEST - 2 VIEW COMPARISON:  Chest radiograph May 31, 2020 and chest CT May 31, 2020 FINDINGS: Enlarged cardiac silhouette. Small left pleural effusion. Bibasilar predominant interstitial and airspace opacities. The visualized skeletal structures are unremarkable. IMPRESSION: Cardiomegaly with small left pleural effusion and bibasilar predominant interstitial and airspace opacities. Findings may reflect CHF/pulmonary edema. Electronically Signed   By: Dahlia Bailiff MD   On: 07/05/2020 17:38    Cardiac Studies   Echocardiogram 06/01/2020 Study reviewed by myself LVEF 55 to 17%, grade 2 diastolic dysfunction Severe concentric LVH, anteroseptal, inferolateral walls measuring up to 1.6 cm Moderate mitral regurgitation Severely dilated left atrium Thickened valves, thickened atrial septum Overall findings consistent with restrictive cardiomyopathy, consider infiltrative disease such as amyloid.  Patient Profile     65 y.o. male with history of  hypertension, hyperlipidemia, diabetes, CKD, obesity presenting with shortness of breath, scrotal swelling being seen for volume overload secondary to HFpEF.  Assessment & Plan    1.  HFpEF -Clinically improving, edema better. -Not accurately documented, ins and outs not recorded. -Strict ins and outs,  -continue Lasix IV 80 twice daily. -With low voltage EKG, severe concentric LVH, infiltrative disease/amyloid needs to be evaluated.  Referral to advanced heart failure clinic, CMR, amyloid/infiltrative disease work-up as outpatient.  2.  Hypertension -BP controlled, losartan, amlodipine decreased to 5 mg daily.  3.  Mitral valve regurgitation -Appears moderate at best on my review -Consider TEE to better assess mitral valve on outpatient basis after adequate diuresing.  Total encounter time 35 minutes  Greater than 50% was spent in counseling and coordination of care with the patient       Signed, Kate Sable, MD  07/07/2020, 12:39 PM

## 2020-07-07 NOTE — Progress Notes (Signed)
PROGRESS NOTE    Victor Hernandez  YSA:[id number] DOB: July 19, 1955 DOA: 07/05/2020 PCP: Remi Haggard, FNP   Brief Narrative: Taken from prior notes Victor Hernandez a 65 y.o.malewith medical history significant of hypertension, type 2 diabetes mellitus,CHF with Pef,CKDIV, morbid obesity who presents to ED with complaint of shortness of breath and progressive swelling of lower extremities and scrotum. Per chart patient has heart failure clinic follow up on 1/31 but was a no show for this appointment. Patient also has interim history of recent admission 1/3-06/02/2020 with diagnosis of Hypertensive crisis with associated hypertensive encephalopathy and acute on chronic diastolic heart failure exacerbation with associated acute hypoxic respiratory failure , patient was treated with lasix iv as well as IV hypertensive with improvement in symptoms with discharge with follow with Heart failure clinic for which he was a no show. Currently patient in addition to have above complaints notes DOE, orthopnea,and PND. He notes no associated chest pain, n/v/d/cough /fever/chills/ uri symptoms,dysuria or abdominal pain.He also noted that he has note being on a fluid or salt restriction. He also notes as an out patient he has had his diuretic increased but noted no change in his swelling. He also states he has been complaint with his medications.  Continue to gain weight despite being started on IV Lasix, not sure about the measurement, continue to have significant edema.  Subjective: Patient continued to have significant shortness of breath and orthopnea.  Stating that he is sleeping in recliner for the past many nights.  Denies any chest pain.  Per patient he is not making a whole lot of urine with IV Lasix. Discussed about saving or the urine so we can measure exact output. 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  Assessment & Plan:   Active  Problems:   CHF (congestive heart failure) (HCC)  Acute on chronic HFrEF.  Patient continued to gain weight and not much urinary output recorded.  Not sure whether he is not making much urine with IV Lasix or some discrepancy in recording of strict intake and output. Cardiology was also consulted-appreciate their recommendations. -Continue with 80 mg IV Lasix twice daily. -Add metolazone -Strict intake and output -Daily BMP -Continue with home dose of metoprolol and losartan  Mitral regurgitation.  Concern of severe mitral regurgitation is also contributing to his symptoms. Cardiology is recommending TEE for further evaluation of his mitral valve once become euvolemic.  Essential hypertension.  Blood pressure mildly elevated. -Continue home dose of metoprolol, amlodipine and losartan. -Continue to monitor  CKD stage IV.  Creatinine currently stable around baseline. -Monitor renal function while he is being diuresed. -Avoid nephrotoxins  Anemia of chronic disease.  Hemoglobin currently stable -Continue to monitor  Type 2 diabetes.  CBG elevated.  Insulin was stopped at last hospitalization. -Continue with Lantus 10 units at bedtime which was started during current hospitalization. -Continue with SSI   Objective: Vitals:   07/07/20 0810 07/07/20 1104 07/07/20 1541 07/07/20 1606  BP: (!) 131/96 133/83 (!) 149/99 (!) 144/95  Pulse: 91 (!) 105 (!) 106 (!) 110  Resp: 16 19 18 18   Temp: 98 F (36.7 C) 97.9 F (36.6 C) 97.7 F (36.5 C)   TempSrc: Oral     SpO2: 98% 97% 100% 99%  Weight:      Height:        Intake/Output Summary (Last 24 hours) at 07/07/2020 1751 Last data filed at 07/07/2020  1717 Gross per 24 hour  Intake 480 ml  Output 550 ml  Net -70 ml   Filed Weights   07/05/20 1704 07/06/20 1322 07/07/20 0301  Weight: 104.3 kg 117.6 kg 123 kg    Examination:  General exam: Appears calm and comfortable  Respiratory system: Clear to auscultation. Respiratory effort  normal. Cardiovascular system: S1 & S2 heard, RRR.  Systolic murmur Gastrointestinal system: Soft, nontender, nondistended, bowel sounds positive. Central nervous system: Alert and oriented. No focal neurological deficits. Extremities: 2+ LE edema, 3+ around thighs and abdomen, no cyanosis, pulses intact and symmetrical. Psychiatry: Judgement and insight appear normal. Mood & affect appropriate.    DVT prophylaxis: Heparin Code Status: Full Family Communication: Discussed with patient Disposition Plan:  Status is: Inpatient  Remains inpatient appropriate because:Inpatient level of care appropriate due to severity of illness   Dispo: The patient is from: Home              Anticipated d/c is to: Home              Anticipated d/c date is: 3 days              Patient currently is not medically stable to d/c.   Difficult to place patient No              Level of care: Progressive Cardiac  Consultants:   Cardiology  Procedures:  Antimicrobials:   Data Reviewed: I have personally reviewed following labs and imaging studies  CBC: Recent Labs  Lab 07/05/20 1709 07/07/20 0516  WBC 8.5 6.4  HGB 11.5* 10.9*  HCT 37.2* 35.4*  MCV 93.5 93.4  PLT 262 161   Basic Metabolic Panel: Recent Labs  Lab 07/05/20 1709 07/06/20 0459 07/07/20 0516  NA 141 141 143  K 3.9 3.9 3.6  CL 104 105 106  CO2 27 29 27   GLUCOSE 388* 377* 95  BUN 59* 55* 50*  CREATININE 2.94* 2.85* 2.60*  CALCIUM 8.3* 8.3* 8.4*  MG  --  1.9  --    GFR: Estimated Creatinine Clearance: 34.6 mL/min (A) (by C-G formula based on SCr of 2.6 mg/dL (H)). Liver Function Tests: No results for input(s): AST, ALT, ALKPHOS, BILITOT, PROT, ALBUMIN in the last 168 hours. No results for input(s): LIPASE, AMYLASE in the last 168 hours. No results for input(s): AMMONIA in the last 168 hours. Coagulation Profile: No results for input(s): INR, PROTIME in the last 168 hours. Cardiac Enzymes: No results for input(s):  CKTOTAL, CKMB, CKMBINDEX, TROPONINI in the last 168 hours. BNP (last 3 results) No results for input(s): PROBNP in the last 8760 hours. HbA1C: No results for input(s): HGBA1C in the last 72 hours. CBG: Recent Labs  Lab 07/06/20 1140 07/06/20 1638 07/07/20 0906 07/07/20 1133 07/07/20 1652  GLUCAP 319* 180* 158* 130* 153*   Lipid Profile: No results for input(s): CHOL, HDL, LDLCALC, TRIG, CHOLHDL, LDLDIRECT in the last 72 hours. Thyroid Function Tests: Recent Labs    07/05/20 1709  TSH 2.949   Anemia Panel: Recent Labs    07/05/20 1709 07/05/20 2322  VITAMINB12  --  207  FOLATE 6.2  --   FERRITIN 61  --   TIBC 228*  --   IRON 38*  --   RETICCTPCT  --  2.7   Sepsis Labs: No results for input(s): PROCALCITON, LATICACIDVEN in the last 168 hours.  Recent Results (from the past 240 hour(s))  SARS CORONAVIRUS 2 (TAT 6-24 HRS) Nasopharyngeal Nasopharyngeal Swab  Status: None   Collection Time: 07/05/20 11:21 PM   Specimen: Nasopharyngeal Swab  Result Value Ref Range Status   SARS Coronavirus 2 NEGATIVE NEGATIVE Final    Comment: (NOTE) SARS-CoV-2 target nucleic acids are NOT DETECTED.  The SARS-CoV-2 RNA is generally detectable in upper and lower respiratory specimens during the acute phase of infection. Negative results do not preclude SARS-CoV-2 infection, do not rule out co-infections with other pathogens, and should not be used as the sole basis for treatment or other patient management decisions. Negative results must be combined with clinical observations, patient history, and epidemiological information. The expected result is Negative.  Fact Sheet for Patients: SugarRoll.be  Fact Sheet for Healthcare Providers: https://www.woods-mathews.com/  This test is not yet approved or cleared by the [place] FDA and  has been authorized for detection and/or diagnosis of SARS-CoV-2 by FDA under an Emergency Use  Authorization (EUA). This EUA will remain  in effect (meaning this test can be used) for the duration of the COVID-19 declaration under Se ction 564(b)(1) of the Act, 21 U.S.C. section 360bbb-3(b)(1), unless the authorization is terminated or revoked sooner.  Performed at [place] [facility], [place] [address] [place], [place] [address]      Radiology Studies: No results found.  Scheduled Meds: . amLODipine  5 mg Oral Daily  . aspirin EC  81 mg Oral Daily  . atorvastatin  40 mg Oral QHS  . furosemide  80 mg Intravenous BID  . heparin  5,000 Units Subcutaneous Q8H  . insulin aspart  0-15 Units Subcutaneous TID WC  . insulin glargine  10 Units Subcutaneous Daily  . losartan  100 mg Oral Daily  . metolazone  5 mg Oral Daily  . metoprolol tartrate  50 mg Oral BID  . Ensure Max Protein  11 oz Oral BID  . sodium chloride flush  3 mL Intravenous Q12H   Continuous Infusions: . sodium chloride       LOS: 2 days   Time spent: 35 minutes.  Lorella Nimrod, MD [facility]  If 7PM-7AM, please contact night-coverage [email or web address]  07/07/2020, 5:51 PM   This record has been created using Systems analyst. Errors have been sought and corrected,but may not always be located. Such creation errors do not reflect on the standard of care.

## 2020-07-07 NOTE — Progress Notes (Signed)
Heart Failure Nurse Navigator Note  HFpEF 50 to 55%.  Mild LVH.  Normal right ventricular systolic function.  Moderate left atrial enlargement.  Moderate to severe mitral regurgitation.  Mild tricuspid regurgitation.  He presented to the emergency room with several weeks of shortness of breath, lower extremity edema extending to his scrotum, dyspnea on exertion, PND and orthopnea.  He was last hospitalized January 3-5 with hypertension and encephalopathy he was no-show for his appointments at the heart failure clinic on January 12 and January 31.  Chest x-ray on admission revealed cardiomegaly with a small left pleural effusion.  Bilateral interstitial and airway opacities.  Comorbidities:  Hyperlipidemia Hypertension Diabetes type 2 Morbid obesity Chronic kidney disease stage IV Anemia  Labs: Sodium 143, potassium 3.6, chloride 106, CO2 27, BUN 50, down from 55 up yesterday, creatinine 2.6 down from 2.85 of yesterday, hemoglobin 10.9 hematocrit 35.4 Intake 600 mL Output 750 mL Weight 123 kg(admission weight documented at 104.3 and 117.6) BMI 43.77 Blood pressure 131/96   Assessment:  General-he is awake and alert sitting up in the chair at bedside in no acute distress.  HEENT sclera nonicteric,   Cardiac-heart tones of regular rate and rhythm with systolic murmur.  Chest:-Breath sounds are diminished to posterior auscultation no wheezes rubs or rhonchi noted.  Abdomen distended rounded nontender.  Musculoskeletal-woody in appearance,bilateral hands and arms are swollen.  Psych-is pleasant and appropriate, makes good eye contact  Neurologic-speech is clear he moves all extremities without difficulty.   Met with patient today, he had just returned from working with physical therapy and ambulated in the hall, noted shortness of breath but not as bad as when he was admitted.  He admitted today that his scrotum is less edematous and is easier for him to ambulate.  Reds  clip vest reading-43.  Discussed with patient again today the importance of maintaining a low-sodium diet, fluid restriction, also stressed the importance of daily weights and recording.  Stressed  that reporting weight gains of 2 pounds overnight or 5 pounds within a week would hopefully keep him out of the hospital.  He states that he knows he needs to make changes, feels that it is going to be hard however he wants to live and  is going to have to make these changes.  Told him that we are all here to work with him and make him more knowledgeable of taking care of himself.  But it is not going to happen over night it is going to take some work.  Pricilla Riffle RN, CHFN

## 2020-07-07 NOTE — Evaluation (Addendum)
Physical Therapy Evaluation Patient Details Name: Victor Hernandez MRN: [id number] DOB: 22-Sep-1955 Today's Date: 07/07/2020   History of Present Illness  65 y.o. male with medical history significant of   hypertension, type 2 diabetes mellitus,CHF with Pef,CKDIV, morbid obesity who presents to ED with complaint of shortness of breath and progressive swelling of lower extremities and scrotum. 1/31 but was a no show for this heart failure clinic appointment. Patient also has interim history of recent admission  1/3-06/02/2020 with diagnosis of Hypertensive crisis with associated hypertensive encephalopathy and acute on chronic diastolic heart failure exacerbation with associated acute hypoxic respiratory failure  Clinical Impression  Pt did reasonably well with mobility and prolonged bout of ambulation, clearly having to alter how he walked with wide BOS (secondary to LE and scrotal swelling).  He was able to maintain consistent and community appropriate cadence, though he did have some fatigue (HR to ~130, O2 to low 90s on room air).  He is not at his baseline due to swelling, but strength, mobility, safety was overall appropriate for return home once medically cleared.  Pt reports he has family that will be able to help him out if he initially needs assist with errands, community activities.    Follow Up Recommendations No PT follow up    Equipment Recommendations  None recommended by PT    Recommendations for Other Services       Precautions / Restrictions Precautions Precautions: Fall Restrictions Weight Bearing Restrictions: No      Mobility  Bed Mobility Overal bed mobility: Independent                  Transfers Overall transfer level: Independent Equipment used: None             General transfer comment: Pt able to rise confidently and w/o assist, did have to maintain wide BOS 2/2 scrotal swelling  Ambulation/Gait Ambulation/Gait assistance: Supervision Gait Distance  (Feet): 150 Feet Assistive device: None       General Gait Details: Pt able to ambulate with consistent, community appropriate cadence, did have some hesitancy and maintainance of wide BOS 2/2 swelling but no LOBs.  He did have some fatigue with HR up to ~130 and O2 dropping to low 90s.  Stairs Stairs: Yes Stairs assistance: Modified independent (Device/Increase time) Stair Management: One rail Right;Forwards Number of Stairs: 8 General stair comments: Pt able to negotiate up/down steps w/o issue, did have some fatigue/shortness of breath coming back up the steps  Wheelchair Mobility    Modified Rankin (Stroke Patients Only)       Balance Overall balance assessment: Modified Independent                                           Pertinent Vitals/Pain Pain Assessment: No/denies pain    Home Living Family/patient expects to be discharged to:: Private residence Living Arrangements: Alone Available Help at Discharge: Family (son and sisters live locally and can assist)   Home Access: Stairs to enter Entrance Stairs-Rails: Right Entrance Stairs-Number of Steps: 4   Home Equipment: None      Prior Function Level of Independence: Independent         Comments: 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, typically able to be very active     Hand Dominance        Extremity/Trunk Assessment   Upper  Extremity Assessment Upper Extremity Assessment: Overall WFL for tasks assessed    Lower Extremity Assessment Lower Extremity Assessment: Overall WFL for tasks assessed       Communication   Communication: No difficulties  Cognition Arousal/Alertness: Awake/alert Behavior During Therapy: WFL for tasks assessed/performed Overall Cognitive Status: Within Functional Limits for tasks assessed                                        General Comments      Exercises     Assessment/Plan    PT Assessment Patent does  not need any further PT services  PT Problem List         PT Treatment Interventions      PT Goals (Current goals can be found in the Care Plan section)  Acute Rehab PT Goals Patient Stated Goal: get the fluid off and go home PT Goal Formulation: All assessment and education complete, DC therapy    Frequency     Barriers to discharge        Co-evaluation               AM-PAC PT "6 Clicks" Mobility  Outcome Measure Help needed turning from your back to your side while in a flat bed without using bedrails?: None Help needed moving from lying on your back to sitting on the side of a flat bed without using bedrails?: None Help needed moving to and from a bed to a chair (including a wheelchair)?: None Help needed standing up from a chair using your arms (e.g., wheelchair or bedside chair)?: None Help needed to walk in hospital room?: None Help needed climbing 3-5 steps with a railing? : None 6 Click Score: 24    End of Session Equipment Utilized During Treatment: Gait belt Activity Tolerance: Patient tolerated treatment well;Patient limited by fatigue Patient left: with call bell/phone within reach;in chair   PT Visit Diagnosis: Unsteadiness on feet (R26.81)    Time: 843 - 859  PT Time Calculation (min) (ACUTE ONLY): 16 min   Charges:   PT Evaluation $PT Eval Low Complexity: 1 Low       Kreg Shropshire, DPT 07/07/2020, 10:30 AM

## 2020-07-08 DIAGNOSIS — I509 Heart failure, unspecified: Secondary | ICD-10-CM

## 2020-07-08 DIAGNOSIS — I169 Hypertensive crisis, unspecified: Secondary | ICD-10-CM

## 2020-07-08 DIAGNOSIS — J810 Acute pulmonary edema: Secondary | ICD-10-CM

## 2020-07-08 DIAGNOSIS — R0602 Shortness of breath: Secondary | ICD-10-CM

## 2020-07-08 DIAGNOSIS — J9601 Acute respiratory failure with hypoxia: Secondary | ICD-10-CM

## 2020-07-08 LAB — GLUCOSE, CAPILLARY
Glucose-Capillary: 153 mg/dL — ABNORMAL HIGH (ref 70–99)
Glucose-Capillary: 176 mg/dL — ABNORMAL HIGH (ref 70–99)
Glucose-Capillary: 156 mg/dL — ABNORMAL HIGH (ref 70–99)
Glucose-Capillary: 175 mg/dL — ABNORMAL HIGH (ref 70–99)

## 2020-07-08 LAB — BASIC METABOLIC PANEL
Anion gap: 10 (ref 5–15)
BUN: 49 mg/dL — ABNORMAL HIGH (ref 8–23)
CO2: 29 mmol/L (ref 22–32)
Calcium: 8.5 mg/dL — ABNORMAL LOW (ref 8.9–10.3)
Chloride: 103 mmol/L (ref 98–111)
Creatinine, Ser: 2.68 mg/dL — ABNORMAL HIGH (ref 0.61–1.24)
GFR, Estimated: 25 mL/min — ABNORMAL LOW (ref >60)
Glucose, Bld: 173 mg/dL — ABNORMAL HIGH (ref 70–99)
Potassium: 3.7 mmol/L (ref 3.5–5.1)
Sodium: 142 mmol/L (ref 135–145)

## 2020-07-08 LAB — CBC
HCT: 32.1 % — ABNORMAL LOW (ref 39.0–52.0)
Hemoglobin: 10 g/dL — ABNORMAL LOW (ref 13.0–17.0)
MCH: 28.7 pg (ref 26.0–34.0)
MCHC: 31.2 g/dL (ref 30.0–36.0)
MCV: 92.2 fL (ref 80.0–100.0)
Platelets: 253 10*3/uL (ref 150–400)
RBC: 3.48 MIL/uL — ABNORMAL LOW (ref 4.22–5.81)
RDW: 14.3 % (ref 11.5–15.5)
WBC: 6.5 10*3/uL (ref 4.0–10.5)
nRBC: 0 % (ref 0.0–0.2)

## 2020-07-08 MED ORDER — AMLODIPINE BESYLATE 5 MG PO TABS
5.0000 mg | ORAL_TABLET | Freq: Two times a day (BID) | ORAL | Status: DC
  Administered 2020-07-08 – 2020-07-12 (×8): 5 mg via ORAL
  Filled 2020-07-08 (×9): qty 1

## 2020-07-08 NOTE — Care Management Important Message (Signed)
Important Message  Patient Details  Name: Victor Hernandez MRN: [id number] Date of Birth: 02-Jul-1955   Medicare Important Message Given:  Yes     Dannette Barbara 07/08/2020, 2:06 PM

## 2020-07-08 NOTE — Progress Notes (Signed)
Progress Note  Patient Name: Victor Hernandez Date of Encounter: 07/08/2020  [facility] Cardiologist: Dr. Saunders Revel  Subjective   Patient put out -2.3L urine overnight. Creatinine 2.60>2.68. On IV lasix 80mg  BID and metolazone. Patient reports he is feeling much better but is still volume up, says he scrotum is still swollen and stomach is full. He has been up and about the room and reports breathing is good.  No chest pain.  Inpatient Medications    Scheduled Meds: . amLODipine  5 mg Oral Daily  . aspirin EC  81 mg Oral Daily  . atorvastatin  40 mg Oral QHS  . furosemide  80 mg Intravenous BID  . heparin  5,000 Units Subcutaneous Q8H  . insulin aspart  0-15 Units Subcutaneous TID WC  . insulin glargine  10 Units Subcutaneous Daily  . losartan  100 mg Oral Daily  . metolazone  5 mg Oral Daily  . metoprolol tartrate  50 mg Oral BID  . Ensure Max Protein  11 oz Oral BID  . sodium chloride flush  3 mL Intravenous Q12H   Continuous Infusions: . sodium chloride     PRN Meds: sodium chloride, acetaminophen, ondansetron (ZOFRAN) IV, sodium chloride flush   Vital Signs    Vitals:   07/07/20 2029 07/08/20 0535 07/08/20 0611 07/08/20 0757  BP: (!) 135/105 (!) 138/96  (!) 152/91  Pulse: 67 (!) 101  (!) 106  Resp: 20   17  Temp: 98 F (36.7 C) 98.3 F (36.8 C)  98.1 F (36.7 C)  TempSrc: Oral Oral  Oral  SpO2: 100% 98%  97%  Weight:   104.4 kg   Height:        Intake/Output Summary (Last 24 hours) at 07/08/2020 0821 Last data filed at 07/08/2020 0500 Gross per 24 hour  Intake 360 ml  Output 2350 ml  Net -1990 ml   Last 3 Weights 07/08/2020 07/07/2020 07/06/2020  Weight (lbs) 230 lb 2.6 oz 271 lb 2.7 oz 259 lb 4.8 oz  Weight (kg) 104.4 kg 123 kg 117.618 kg      Telemetry    NSRm PACS, PVCs, HR 80-100 - Personally Reviewed  ECG    No new - Personally Reviewed  Physical Exam   GEN: No acute distress.   Neck: + JVD Cardiac: RRR, +murmur, no rubs, or gallops.   Respiratory: diminished at bases GI: +distended  MS: + lower leg edema; No deformity. Neuro:  Nonfocal  Psych: Normal affect   Labs    High Sensitivity Troponin:  No results for input(s): TROPONINIHS in the last 720 hours.    Chemistry Recent Labs  Lab 07/06/20 0459 07/07/20 0516 07/08/20 0552  NA 141 143 142  K 3.9 3.6 3.7  CL 105 106 103  CO2 29 27 29   GLUCOSE 377* 95 173*  BUN 55* 50* 49*  CREATININE 2.85* 2.60* 2.68*  CALCIUM 8.3* 8.4* 8.5*  GFRNONAA 24* 26* 25*  ANIONGAP 7 10 10      Hematology Recent Labs  Lab 07/05/20 1709 07/05/20 2322 07/07/20 0516 07/08/20 0552  WBC 8.5  --  6.4 6.5  RBC 3.98* 3.69* 3.79* 3.48*  HGB 11.5*  --  10.9* 10.0*  HCT 37.2*  --  35.4* 32.1*  MCV 93.5  --  93.4 92.2  MCH 28.9  --  28.8 28.7  MCHC 30.9  --  30.8 31.2  RDW 14.1  --  14.5 14.3  PLT 262  --  262 253  BNP Recent Labs  Lab 07/05/20 1709  BNP 866.4*     DDimer No results for input(s): DDIMER in the last 168 hours.   Radiology    No results found.  Cardiac Studies   Echo 06/01/2020 1. Left ventricular ejection fraction, by estimation, is 50 to 55%. The  left ventricle has low normal function. The left ventricle has no regional  wall motion abnormalities. There is moderate left ventricular hypertrophy.  Left ventricular diastolic  parameters were normal.  2. Right ventricular systolic function is normal. The right ventricular  size is normal.  3. Left atrial size was moderately dilated.  4. The mitral valve is abnormal. Moderate to severe mitral valve  regurgitation.  5. The aortic valve is normal in structure. Aortic valve regurgitation is  not visualized.    Patient Profile     65 y.o. male with hx of HTN, HLD, diabetes, CKD IV, obesity who is being seen for HFpEF with scrotal swelling.  Assessment & Plan    Acute on chronic HFpEF - IV lasix 80mg  BID - metolazone 5mg  daily - patient put out -2.3L urine overnight. Not all I&Os have  been recorded acurately - Unsure if weights are accurate - creatinine stable compared to yesterday - Patient is improving but still volume up on exam. Continue diuresis - Continue Losartan, Metoprolol - Plan to consider amyloid work-up as outpatient  HTN - amlodipine 5mg  - losartan 100mg  daily - lopressor 50mg  BID - Pressures reasonable  Mitral regurgitation - Recent echo showed mod to severe MR. MD reviewed and felt to be moderate - can consider TEE after diuresis, can be OP  CKD stage IV - creatinine 2.6>2.68, which appears to be around baseline  Anemia of chronic disease - Hgb currently stable  DM2 - SSI per IM  HLD - atorvastatin 40mg  daily  For questions or updates, please contact [facility] Please consult [email or web address] for contact info under        Signed, Alexandrya Chim Ninfa Meeker, PA-C  07/08/2020, 8:21 AM

## 2020-07-08 NOTE — Progress Notes (Signed)
   Heart Failure Nurse Navigator Note  HfpEF 50-55%.  Mild LVH.  Normal right ventricular systolic function.  Moderate left atrial enlargement.  Moderate to severe mitral regurgitation.  Mild tricuspid regurgitation.  He presented to the emergency room with several weeks of shortness of breath, lower extremity edema extending to his scrotum, dyspnea on exertion, PND and orthopnea.  He was last hospitalized January 3-5 with hypertension and encephalopathy.  He was no-show for his appointments at the heart failure clinic on January 12 and January 31.  Chest x-ray on admission revealed cardiomegaly with small pleural effusion on the left.  Bilateral interstitial and airway opacities.   Comorbidities:  Hyperlipidemia Hypertension Diabetes type 2 Morbid obesity Chronic kidney disease stage IV Anemia  Labs: Sodium 142, potassium 3.7, chloride 103, CO2 29, BUN 49, creatinine 2.68 up from 2.6 of yesterday. Intake 360 mL Output 2350 mL Weight 104.4 kg (weights have been documented from 104.3 up to 123.) Blood pressure 129/74   Assessment:  General-he is awake and alert sitting up in the chair at bedside.  He states that he did not have a good night sleep last night as he was restless.   HEENT-sclera nonicteric, normocephalic.  Cardiac-tones of regular rate and rhythm with systolic murmur.  Chest-breath sounds are clear to posterior auscultation.  Abdomen remains distended, firm.  Musculoskeletal-lower extremities are woody in appearance, bilateral hands remained swollen.  Psych-he is pleasant and appropriate, makes good eye contact.  Neurologic-speech is clear, he moves all extremities without difficulty.   Spoke with patient again today.  He still feels that it is going  be hard to take care of himself when he goes home, discussed food types to eat, reading labels.  By teach back method discussed what he is going to report to physician.  Pricilla Riffle RN, CHFN

## 2020-07-08 NOTE — Progress Notes (Signed)
PROGRESS NOTE    Victor Hernandez  SNK:[id number] DOB: 11/12/1955 DOA: 07/05/2020 PCP: Remi Haggard, FNP   Brief Narrative: Taken from prior notes Victor Hernandez a 65 y.o.malewith medical history significant of hypertension, type 2 diabetes mellitus,CHF with Pef,CKDIV, morbid obesity who presents to ED with complaint of shortness of breath and progressive swelling of lower extremities and scrotum. Per chart patient has heart failure clinic follow up on 1/31 but was a no show for this appointment. Patient also has interim history of recent admission 1/3-06/02/2020 with diagnosis of Hypertensive crisis with associated hypertensive encephalopathy and acute on chronic diastolic heart failure exacerbation with associated acute hypoxic respiratory failure , patient was treated with lasix iv as well as IV hypertensive with improvement in symptoms with discharge with follow with Heart failure clinic for which he was a no show. Currently patient in addition to have above complaints notes DOE, orthopnea,and PND. He notes no associated chest pain, n/v/d/cough /fever/chills/ uri symptoms,dysuria or abdominal pain.He also noted that he has note being on a fluid or salt restriction. He also notes as an out patient he has had his diuretic increased but noted no change in his swelling. He also states he has been complaint with his medications.  Continue to gain weight despite being started on IV Lasix, not sure about the measurement, continue to have significant edema.  Subjective: Patient started having good urinary output after adding metolazone. Continue to have orthopnea. No chest pain. Weight at 230 which was at ED, some improvement in weighing.  Assessment & Plan:   Active Problems:   CHF (congestive heart failure) (HCC)   Shortness of breath  Acute on chronic HFrEF. Weight at 230 which is similar to his admission weight in ED, remained significantly volume overload with a lot of edema involving  thighs and belly. Improved urinary output. Cardiology was also consulted-appreciate their recommendations. -Continue with 80 mg IV Lasix twice daily. -Continue metolazone -Strict intake and output -Daily BMP -Continue with home dose of metoprolol and losartan  Mitral regurgitation.  Concern of severe mitral regurgitation is also contributing to his symptoms. Cardiology is recommending TEE for further evaluation of his mitral valve once become euvolemic.  Essential hypertension.  Blood pressure mildly elevated. -Continue home dose of metoprolol, amlodipine and losartan. -Continue to monitor  CKD stage IV.  Creatinine currently stable around baseline. -Monitor renal function while he is being diuresed. -Avoid nephrotoxins  Anemia of chronic disease.  Hemoglobin currently stable -Continue to monitor  Type 2 diabetes.  CBG with some improvement.  Insulin was stopped at last hospitalization. -Continue with Lantus 10 units at bedtime which was started during current hospitalization. -Continue with SSI   Objective: Vitals:   07/08/20 0535 07/08/20 0611 07/08/20 0757 07/08/20 1123  BP: (!) 138/96  (!) 152/91 129/74  Pulse: (!) 101  (!) 106 (!) 59  Resp:   17 17  Temp: 98.3 F (36.8 C)  98.1 F (36.7 C) 97.7 F (36.5 C)  TempSrc: Oral  Oral Oral  SpO2: 98%  97% 100%  Weight:  104.4 kg    Height:        Intake/Output Summary (Last 24 hours) at 07/08/2020 1431 Last data filed at 07/08/2020 1000 Gross per 24 hour  Intake 360 ml  Output 3050 ml  Net -2690 ml   Filed Weights   07/06/20 1322 07/07/20 0301 07/08/20 0611  Weight: 117.6 kg 123 kg 104.4 kg    Examination:  General. Chronically ill-appearing gentleman, in  no acute distress. Pulmonary. Few basal crackles, normal respiratory effort. CV.  Regular rate and rhythm, no JVD, rub or murmur. Abdomen.  Soft, nontender, nondistended, BS positive. CNS.  Alert and oriented x3.  No focal neurologic deficit. Extremities. 1+  LE and 3+ thigh and belly edema, pulses intact and symmetrical. Psychiatry.  Judgment and insight appears normal.   DVT prophylaxis: Heparin Code Status: Full Family Communication: Discussed with patient Disposition Plan:  Status is: Inpatient  Remains inpatient appropriate because:Inpatient level of care appropriate due to severity of illness   Dispo: The patient is from: Home              Anticipated d/c is to: Home              Anticipated d/c date is: 3 days              Patient currently is not medically stable to d/c.   Difficult to place patient No              Level of care: Progressive Cardiac  Consultants:   Cardiology  Procedures:  Antimicrobials:   Data Reviewed: I have personally reviewed following labs and imaging studies  CBC: Recent Labs  Lab 07/05/20 1709 07/07/20 0516 07/08/20 0552  WBC 8.5 6.4 6.5  HGB 11.5* 10.9* 10.0*  HCT 37.2* 35.4* 32.1*  MCV 93.5 93.4 92.2  PLT 262 262 509   Basic Metabolic Panel: Recent Labs  Lab 07/05/20 1709 07/06/20 0459 07/07/20 0516 07/08/20 0552  NA 141 141 143 142  K 3.9 3.9 3.6 3.7  CL 104 105 106 103  CO2 27 29 27 29   GLUCOSE 388* 377* 95 173*  BUN 59* 55* 50* 49*  CREATININE 2.94* 2.85* 2.60* 2.68*  CALCIUM 8.3* 8.3* 8.4* 8.5*  MG  --  1.9  --   --    GFR: Estimated Creatinine Clearance: 30.7 mL/min (A) (by C-G formula based on SCr of 2.68 mg/dL (H)). Liver Function Tests: No results for input(s): AST, ALT, ALKPHOS, BILITOT, PROT, ALBUMIN in the last 168 hours. No results for input(s): LIPASE, AMYLASE in the last 168 hours. No results for input(s): AMMONIA in the last 168 hours. Coagulation Profile: No results for input(s): INR, PROTIME in the last 168 hours. Cardiac Enzymes: No results for input(s): CKTOTAL, CKMB, CKMBINDEX, TROPONINI in the last 168 hours. BNP (last 3 results) No results for input(s): PROBNP in the last 8760 hours. HbA1C: No results for input(s): HGBA1C in the last 72  hours. CBG: Recent Labs  Lab 07/07/20 1133 07/07/20 1652 07/07/20 2029 07/08/20 0757 07/08/20 1123  GLUCAP 130* 153* 210* 153* 176*   Lipid Profile: No results for input(s): CHOL, HDL, LDLCALC, TRIG, CHOLHDL, LDLDIRECT in the last 72 hours. Thyroid Function Tests: Recent Labs    07/05/20 1709  TSH 2.949   Anemia Panel: Recent Labs    07/05/20 1709 07/05/20 2322  VITAMINB12  --  207  FOLATE 6.2  --   FERRITIN 61  --   TIBC 228*  --   IRON 38*  --   RETICCTPCT  --  2.7   Sepsis Labs: No results for input(s): PROCALCITON, LATICACIDVEN in the last 168 hours.  Recent Results (from the past 240 hour(s))  SARS CORONAVIRUS 2 (TAT 6-24 HRS) Nasopharyngeal Nasopharyngeal Swab     Status: None   Collection Time: 07/05/20 11:21 PM   Specimen: Nasopharyngeal Swab  Result Value Ref Range Status   SARS Coronavirus 2 NEGATIVE NEGATIVE Final  Comment: (NOTE) SARS-CoV-2 target nucleic acids are NOT DETECTED.  The SARS-CoV-2 RNA is generally detectable in upper and lower respiratory specimens during the acute phase of infection. Negative results do not preclude SARS-CoV-2 infection, do not rule out co-infections with other pathogens, and should not be used as the sole basis for treatment or other patient management decisions. Negative results must be combined with clinical observations, patient history, and epidemiological information. The expected result is Negative.  Fact Sheet for Patients: SugarRoll.be  Fact Sheet for Healthcare Providers: https://www.woods-mathews.com/  This test is not yet approved or cleared by the [place] FDA and  has been authorized for detection and/or diagnosis of SARS-CoV-2 by FDA under an Emergency Use Authorization (EUA). This EUA will remain  in effect (meaning this test can be used) for the duration of the COVID-19 declaration under Se ction 564(b)(1) of the Act, 21 U.S.C. section  360bbb-3(b)(1), unless the authorization is terminated or revoked sooner.  Performed at [facility], [place] [address], [place], [place] [address]      Radiology Studies: No results found.  Scheduled Meds: . amLODipine  5 mg Oral Daily  . aspirin EC  81 mg Oral Daily  . atorvastatin  40 mg Oral QHS  . furosemide  80 mg Intravenous BID  . heparin  5,000 Units Subcutaneous Q8H  . insulin aspart  0-15 Units Subcutaneous TID WC  . insulin glargine  10 Units Subcutaneous Daily  . losartan  100 mg Oral Daily  . metolazone  5 mg Oral Daily  . metoprolol tartrate  50 mg Oral BID  . Ensure Max Protein  11 oz Oral BID  . sodium chloride flush  3 mL Intravenous Q12H   Continuous Infusions: . sodium chloride       LOS: 3 days   Time spent: 30 minutes.  Lorella Nimrod, MD [facility]  If 7PM-7AM, please contact night-coverage [email or web address]  07/08/2020, 2:31 PM   This record has been created using Systems analyst. Errors have been sought and corrected,but may not always be located. Such creation errors do not reflect on the standard of care.

## 2020-07-08 NOTE — Progress Notes (Signed)
Mobility Specialist - Progress Note   07/08/20 1200  Mobility  Activity Ambulated in hall  Level of Assistance Independent  Assistive Device None  Distance Ambulated (ft) 320 ft  Mobility Response Tolerated well  Mobility performed by Mobility specialist  $Mobility charge 1 Mobility    Pre-mobility: 107 HR, 98% SpO2 During mobility: 105 HR, 99% SpO2 Post-mobility: 104 HR, 100% SpO2   Pt was sitting in recliner upon arrival utilizing room air. Pt agreed to session. Pt denied pain, nausea, and fatigue. Pt independent with transfers this date, including ambulation. Pt ambulated 320' in room/hallway without use of AD. No LOB noted. Pt denied dizziness, weakness, and SOB. Overall, pt tolerated session well. Pt interested in ambulating independently in between session, mobility reassured pt to always check in with nursing first. Pt showed understanding. Pt was left in recliner with all needs in reach.   Kathee Delton Mobility Specialist 07/08/20, 12:22 PM

## 2020-07-09 LAB — CBC
HCT: 32.2 % — ABNORMAL LOW (ref 39.0–52.0)
Hemoglobin: 10.1 g/dL — ABNORMAL LOW (ref 13.0–17.0)
MCH: 28.7 pg (ref 26.0–34.0)
MCHC: 31.4 g/dL (ref 30.0–36.0)
MCV: 91.5 fL (ref 80.0–100.0)
Platelets: 264 10*3/uL (ref 150–400)
RBC: 3.52 MIL/uL — ABNORMAL LOW (ref 4.22–5.81)
RDW: 14.3 % (ref 11.5–15.5)
WBC: 6 10*3/uL (ref 4.0–10.5)
nRBC: 0 % (ref 0.0–0.2)

## 2020-07-09 LAB — GLUCOSE, CAPILLARY
Glucose-Capillary: 134 mg/dL — ABNORMAL HIGH (ref 70–99)
Glucose-Capillary: 167 mg/dL — ABNORMAL HIGH (ref 70–99)
Glucose-Capillary: 204 mg/dL — ABNORMAL HIGH (ref 70–99)
Glucose-Capillary: 172 mg/dL — ABNORMAL HIGH (ref 70–99)

## 2020-07-09 LAB — BASIC METABOLIC PANEL
Anion gap: 11 (ref 5–15)
BUN: 48 mg/dL — ABNORMAL HIGH (ref 8–23)
CO2: 30 mmol/L (ref 22–32)
Calcium: 8.6 mg/dL — ABNORMAL LOW (ref 8.9–10.3)
Chloride: 101 mmol/L (ref 98–111)
Creatinine, Ser: 2.43 mg/dL — ABNORMAL HIGH (ref 0.61–1.24)
GFR, Estimated: 29 mL/min — ABNORMAL LOW (ref >60)
Glucose, Bld: 135 mg/dL — ABNORMAL HIGH (ref 70–99)
Potassium: 3.6 mmol/L (ref 3.5–5.1)
Sodium: 142 mmol/L (ref 135–145)

## 2020-07-09 NOTE — Progress Notes (Signed)
Progress Note  Patient Name: Victor Hernandez Date of Encounter: 07/09/2020  Primary Cardiologist: End  Subjective   Dyspnea and extremity swelling are improving.  Able to sleep mostly supine.  Suspect weights are inaccurate.  Documented urine output 2.5 L for the past 24 hours with a net -5.1 L for the admission.  Renal function continues to improve with diuresis.  Anemia stable, though hemoglobin is down 4 g from early January.  Inpatient Medications    Scheduled Meds: . amLODipine  5 mg Oral BID  . aspirin EC  81 mg Oral Daily  . atorvastatin  40 mg Oral QHS  . furosemide  80 mg Intravenous BID  . heparin  5,000 Units Subcutaneous Q8H  . insulin aspart  0-15 Units Subcutaneous TID WC  . insulin glargine  10 Units Subcutaneous Daily  . losartan  100 mg Oral Daily  . metolazone  5 mg Oral Daily  . metoprolol tartrate  50 mg Oral BID  . Ensure Max Protein  11 oz Oral BID  . sodium chloride flush  3 mL Intravenous Q12H   Continuous Infusions: . sodium chloride     PRN Meds: sodium chloride, acetaminophen, ondansetron (ZOFRAN) IV, sodium chloride flush   Vital Signs    Vitals:   07/08/20 2042 07/09/20 0406 07/09/20 0743 07/09/20 1132  BP: (!) 141/79 (!) 143/96 (!) 161/99 134/78  Pulse: 63 (!) 102 (!) 106 (!) 59  Resp: 17 16 18 18   Temp: 98.6 F (37 C) 98.4 F (36.9 C) 98.2 F (36.8 C) 98.4 F (36.9 C)  TempSrc:   Oral   SpO2: 97% 94% 96% 98%  Weight:  123.6 kg    Height:        Intake/Output Summary (Last 24 hours) at 07/09/2020 1152 Last data filed at 07/09/2020 1017 Gross per 24 hour  Intake 960 ml  Output 2925 ml  Net -1965 ml   Filed Weights   07/08/20 0611 07/08/20 1800 07/09/20 0406  Weight: 104.4 kg 115.9 kg 123.6 kg    Telemetry    Sinus rhythm with short runs of atrial tach, PACs, and PVCs - Personally Reviewed  ECG    No new tracings - Personally Reviewed  Physical Exam   GEN: No acute distress.   Neck: JVD elevated approximately 10  cm. Cardiac: RRR, no murmurs, rubs, or gallops.  Respiratory:  Diminished breath sounds bilaterally at the bases with scattered crackles.  GI: Soft, nontender, non-distended.   MS:  Improved bilateral lower extremity edema with persistent trace pretibial edema; No deformity. Neuro:  Alert and oriented x 3; Nonfocal.  Psych: Normal affect.  Labs    Chemistry Recent Labs  Lab 07/07/20 0516 07/08/20 0552 07/09/20 0547  NA 143 142 142  K 3.6 3.7 3.6  CL 106 103 101  CO2 27 29 30   GLUCOSE 95 173* 135*  BUN 50* 49* 48*  CREATININE 2.60* 2.68* 2.43*  CALCIUM 8.4* 8.5* 8.6*  GFRNONAA 26* 25* 29*  ANIONGAP 10 10 11      Hematology Recent Labs  Lab 07/07/20 0516 07/08/20 0552 07/09/20 0547  WBC 6.4 6.5 6.0  RBC 3.79* 3.48* 3.52*  HGB 10.9* 10.0* 10.1*  HCT 35.4* 32.1* 32.2*  MCV 93.4 92.2 91.5  MCH 28.8 28.7 28.7  MCHC 30.8 31.2 31.4  RDW 14.5 14.3 14.3  PLT 262 253 264    Cardiac EnzymesNo results for input(s): TROPONINI in the last 168 hours. No results for input(s): TROPIPOC in the  last 168 hours.   BNP Recent Labs  Lab 07/05/20 1709  BNP 866.4*     DDimer No results for input(s): DDIMER in the last 168 hours.   Radiology    No results found.  Cardiac Studies   2D echo 06/01/2020: 1. Left ventricular ejection fraction, by estimation, is 50 to 55%. The  left ventricle has low normal function. The left ventricle has no regional  wall motion abnormalities. There is moderate left ventricular hypertrophy.  Left ventricular diastolic  parameters were normal.  2. Right ventricular systolic function is normal. The right ventricular  size is normal.  3. Left atrial size was moderately dilated.  4. The mitral valve is abnormal. Moderate to severe mitral valve  regurgitation.  5. The aortic valve is normal in structure. Aortic valve regurgitation is  not visualized.   Patient Profile     65 y.o. male with history of HFpEF, moderate to severe mitral  regurgitation, CKD stage III to IV, DM2, poorly controlled HTN, HLD, obesity, and remote alcohol and tobacco use who we are seeing for acute on chronic HFpEF.   Assessment & Plan    1.  Acute on chronic HFpEF: -Volume status is improving, though he does remain volume up -Continue IV Lasix with metolazone for at least the next 24 hours -At time of discharge, would transition to torsemide 100 mg daily without metolazone  -Hypertension management as below -Not on MRA in the setting of CKD  -CHF education, history of high fluid intake  -Recommend standing scale weight, suspect his weights are inaccurate currently   2.  HTN: -Blood pressure elevated at times -Continue current medical therapy -If needed, could transition from losartan to [facility], could also change metoprolol to Coreg  3.  Acute on CKD stage III-IV: -Renal function improving with diuresis  -Monitor daily   4.  Mitral regurgitation: -Felt to be dynamic -Consider repeating a limited echo as an outpatient to reassess -If his MR still appears moderate to severe, he would need a TEE to further evaluate   5.  Sleep disordered breathing: -Needs outpatient sleep study  6. LVH: -Needs outpatient cMRI/amyloid workup  -Consider referral to advanced heart failure clinic based on results    For questions or updates, please contact [facility] Please consult [email or web address] for contact info under Cardiology/STEMI.    Signed, Christell Faith, PA-C [place] Pager: [phone] 07/09/2020, 11:52 AM

## 2020-07-09 NOTE — Progress Notes (Signed)
PROGRESS NOTE    Victor Hernandez  TKZ:[id number] DOB: 11/12/1955 DOA: 07/05/2020 PCP: Remi Haggard, FNP   Brief Narrative: Taken from prior notes Victor Hernandez a 65 y.o.malewith medical history significant of hypertension, type 2 diabetes mellitus,CHF with Pef,CKDIV, morbid obesity who presents to ED with complaint of shortness of breath and progressive swelling of lower extremities and scrotum. Per chart patient has heart failure clinic follow up on 1/31 but was a no show for this appointment. Patient also has interim history of recent admission 1/3-06/02/2020 with diagnosis of Hypertensive crisis with associated hypertensive encephalopathy and acute on chronic diastolic heart failure exacerbation with associated acute hypoxic respiratory failure , patient was treated with lasix iv as well as IV hypertensive with improvement in symptoms with discharge with follow with Heart failure clinic for which he was a no show. Currently patient in addition to have above complaints notes DOE, orthopnea,and PND. He notes no associated chest pain, n/v/d/cough /fever/chills/ uri symptoms,dysuria or abdominal pain.He also noted that he has note being on a fluid or salt restriction. He also notes as an out patient he has had his diuretic increased but noted no change in his swelling. He also states he has been complaint with his medications.  Continue to gain weight despite being started on IV Lasix, not sure about the measurement, continue to have significant edema.  Subjective: Patient has no new complaint today.  Asking about going home.  Continue to have significant edema, started improving but a lot more room to go.  Assessment & Plan:   Active Problems:   CHF (congestive heart failure) (HCC)   Shortness of breath  Acute on chronic HFrEF. Weight at 230 which is similar to his admission weight in ED, remained significantly volume overload with a lot of edema involving thighs and belly. Improved urinary  output. Cardiology was also consulted-appreciate their recommendations. -Continue with 80 mg IV Lasix twice daily. -Continue metolazone -Strict intake and output -Daily BMP -Continue with home dose of metoprolol and losartan  Mitral regurgitation.  Concern of severe mitral regurgitation is also contributing to his symptoms. Cardiology is recommending TEE for further evaluation of his mitral valve once become euvolemic.  Essential hypertension.  Blood pressure mildly elevated. -Continue home dose of metoprolol, amlodipine and losartan. -Continue to monitor  CKD stage IV.  Creatinine currently stable around baseline. -Monitor renal function while he is being diuresed. -Avoid nephrotoxins  Anemia of chronic disease.  Hemoglobin currently stable -Continue to monitor  Type 2 diabetes.  CBG with some improvement.  Insulin was stopped at last hospitalization. -Continue with Lantus 10 units at bedtime which was started during current hospitalization. -Continue with SSI   Objective: Vitals:   07/09/20 0406 07/09/20 0743 07/09/20 1132 07/09/20 1536  BP: (!) 143/96 (!) 161/99 134/78 (!) 147/84  Pulse: (!) 102 (!) 106 (!) 59 74  Resp: 16 18 18 18   Temp: 98.4 F (36.9 C) 98.2 F (36.8 C) 98.4 F (36.9 C) 98 F (36.7 C)  TempSrc:  Oral    SpO2: 94% 96% 98% 96%  Weight: 123.6 kg     Height:        Intake/Output Summary (Last 24 hours) at 07/09/2020 1633 Last data filed at 07/09/2020 1348 Gross per 24 hour  Intake 960 ml  Output 2925 ml  Net -1965 ml   Filed Weights   07/08/20 0611 07/08/20 1800 07/09/20 0406  Weight: 104.4 kg 115.9 kg 123.6 kg    Examination:  General.  Well-developed gentleman, in no acute distress. Pulmonary.  Lungs clear bilaterally, normal respiratory effort. CV.  Regular rate and rhythm, no JVD, rub or murmur. Abdomen.  Soft, nontender, nondistended, BS positive. CNS.  Alert and oriented x3.  No focal neurologic deficit. Extremities.  1+ lower  extremity and 2+ thigh and barely edema. Psychiatry.  Judgment and insight appears normal.  DVT prophylaxis: Heparin Code Status: Full Family Communication: Discussed with patient Disposition Plan:  Status is: Inpatient  Remains inpatient appropriate because:Inpatient level of care appropriate due to severity of illness   Dispo: The patient is from: Home              Anticipated d/c is to: Home              Anticipated d/c date is: 3 days              Patient currently is not medically stable to d/c.   Difficult to place patient No              Level of care: Progressive Cardiac  Consultants:   Cardiology  Procedures:  Antimicrobials:   Data Reviewed: I have personally reviewed following labs and imaging studies  CBC: Recent Labs  Lab 07/05/20 1709 07/07/20 0516 07/08/20 0552 07/09/20 0547  WBC 8.5 6.4 6.5 6.0  HGB 11.5* 10.9* 10.0* 10.1*  HCT 37.2* 35.4* 32.1* 32.2*  MCV 93.5 93.4 92.2 91.5  PLT 262 262 253 623   Basic Metabolic Panel: Recent Labs  Lab 07/05/20 1709 07/06/20 0459 07/07/20 0516 07/08/20 0552 07/09/20 0547  NA 141 141 143 142 142  K 3.9 3.9 3.6 3.7 3.6  CL 104 105 106 103 101  CO2 27 29 27 29 30   GLUCOSE 388* 377* 95 173* 135*  BUN 59* 55* 50* 49* 48*  CREATININE 2.94* 2.85* 2.60* 2.68* 2.43*  CALCIUM 8.3* 8.3* 8.4* 8.5* 8.6*  MG  --  1.9  --   --   --    GFR: Estimated Creatinine Clearance: 37.1 mL/min (A) (by C-G formula based on SCr of 2.43 mg/dL (H)). Liver Function Tests: No results for input(s): AST, ALT, ALKPHOS, BILITOT, PROT, ALBUMIN in the last 168 hours. No results for input(s): LIPASE, AMYLASE in the last 168 hours. No results for input(s): AMMONIA in the last 168 hours. Coagulation Profile: No results for input(s): INR, PROTIME in the last 168 hours. Cardiac Enzymes: No results for input(s): CKTOTAL, CKMB, CKMBINDEX, TROPONINI in the last 168 hours. BNP (last 3 results) No results for input(s): PROBNP in the last 8760  hours. HbA1C: No results for input(s): HGBA1C in the last 72 hours. CBG: Recent Labs  Lab 07/08/20 1123 07/08/20 1616 07/08/20 2041 07/09/20 0744 07/09/20 1130  GLUCAP 176* 156* 175* 134* 167*   Lipid Profile: No results for input(s): CHOL, HDL, LDLCALC, TRIG, CHOLHDL, LDLDIRECT in the last 72 hours. Thyroid Function Tests: No results for input(s): TSH, T4TOTAL, FREET4, T3FREE, THYROIDAB in the last 72 hours. Anemia Panel: No results for input(s): VITAMINB12, FOLATE, FERRITIN, TIBC, IRON, RETICCTPCT in the last 72 hours. Sepsis Labs: No results for input(s): PROCALCITON, LATICACIDVEN in the last 168 hours.  Recent Results (from the past 240 hour(s))  SARS CORONAVIRUS 2 (TAT 6-24 HRS) Nasopharyngeal Nasopharyngeal Swab     Status: None   Collection Time: 07/05/20 11:21 PM   Specimen: Nasopharyngeal Swab  Result Value Ref Range Status   SARS Coronavirus 2 NEGATIVE NEGATIVE Final    Comment: (NOTE) SARS-CoV-2 target nucleic acids  are NOT DETECTED.  The SARS-CoV-2 RNA is generally detectable in upper and lower respiratory specimens during the acute phase of infection. Negative results do not preclude SARS-CoV-2 infection, do not rule out co-infections with other pathogens, and should not be used as the sole basis for treatment or other patient management decisions. Negative results must be combined with clinical observations, patient history, and epidemiological information. The expected result is Negative.  Fact Sheet for Patients: SugarRoll.be  Fact Sheet for Healthcare Providers: https://www.woods-mathews.com/  This test is not yet approved or cleared by the [place] FDA and  has been authorized for detection and/or diagnosis of SARS-CoV-2 by FDA under an Emergency Use Authorization (EUA). This EUA will remain  in effect (meaning this test can be used) for the duration of the COVID-19 declaration under Se ction 564(b)(1) of  the Act, 21 U.S.C. section 360bbb-3(b)(1), unless the authorization is terminated or revoked sooner.  Performed at [facility], [place] [address], [place], [place] [address]      Radiology Studies: No results found.  Scheduled Meds: . amLODipine  5 mg Oral BID  . aspirin EC  81 mg Oral Daily  . atorvastatin  40 mg Oral QHS  . furosemide  80 mg Intravenous BID  . heparin  5,000 Units Subcutaneous Q8H  . insulin aspart  0-15 Units Subcutaneous TID WC  . insulin glargine  10 Units Subcutaneous Daily  . losartan  100 mg Oral Daily  . metolazone  5 mg Oral Daily  . metoprolol tartrate  50 mg Oral BID  . Ensure Max Protein  11 oz Oral BID  . sodium chloride flush  3 mL Intravenous Q12H   Continuous Infusions: . sodium chloride       LOS: 4 days   Time spent: 25 minutes.  Lorella Nimrod, MD [facility]  If 7PM-7AM, please contact night-coverage [email or web address]  07/09/2020, 4:33 PM   This record has been created using Systems analyst. Errors have been sought and corrected,but may not always be located. Such creation errors do not reflect on the standard of care.

## 2020-07-10 DIAGNOSIS — I5021 Acute systolic (congestive) heart failure: Secondary | ICD-10-CM

## 2020-07-10 LAB — CBC
HCT: 33 % — ABNORMAL LOW (ref 39.0–52.0)
Hemoglobin: 10.7 g/dL — ABNORMAL LOW (ref 13.0–17.0)
MCH: 29.2 pg (ref 26.0–34.0)
MCHC: 32.4 g/dL (ref 30.0–36.0)
MCV: 90.2 fL (ref 80.0–100.0)
Platelets: 260 10*3/uL (ref 150–400)
RBC: 3.66 MIL/uL — ABNORMAL LOW (ref 4.22–5.81)
RDW: 14.2 % (ref 11.5–15.5)
WBC: 6.2 10*3/uL (ref 4.0–10.5)
nRBC: 0 % (ref 0.0–0.2)

## 2020-07-10 LAB — BASIC METABOLIC PANEL
Anion gap: 9 (ref 5–15)
BUN: 48 mg/dL — ABNORMAL HIGH (ref 8–23)
CO2: 31 mmol/L (ref 22–32)
Calcium: 8.6 mg/dL — ABNORMAL LOW (ref 8.9–10.3)
Chloride: 100 mmol/L (ref 98–111)
Creatinine, Ser: 2.37 mg/dL — ABNORMAL HIGH (ref 0.61–1.24)
GFR, Estimated: 29 mL/min — ABNORMAL LOW (ref >60)
Glucose, Bld: 125 mg/dL — ABNORMAL HIGH (ref 70–99)
Potassium: 3.5 mmol/L (ref 3.5–5.1)
Sodium: 140 mmol/L (ref 135–145)

## 2020-07-10 LAB — GLUCOSE, CAPILLARY
Glucose-Capillary: 132 mg/dL — ABNORMAL HIGH (ref 70–99)
Glucose-Capillary: 214 mg/dL — ABNORMAL HIGH (ref 70–99)
Glucose-Capillary: 185 mg/dL — ABNORMAL HIGH (ref 70–99)
Glucose-Capillary: 179 mg/dL — ABNORMAL HIGH (ref 70–99)

## 2020-07-10 NOTE — Progress Notes (Addendum)
Progress Note  Patient Name: Victor Hernandez Date of Encounter: 07/10/2020  Primary Cardiologist: End  Subjective   Dyspnea and extremity swelling are improving.  He cannot tell any difference in breathing today when compared to yesterday.  Able to sleep mostly supine.  Suspect weights are inaccurate.  Documented urine output 1.5 L for the past 24 hours with a net -5.5 L for the admission.  Renal function continues to improve with diuresis.  Anemia stable, though hemoglobin is down 4 g from early January.  Wants to go home.   Inpatient Medications    Scheduled Meds:  amLODipine  5 mg Oral BID   aspirin EC  81 mg Oral Daily   atorvastatin  40 mg Oral QHS   furosemide  80 mg Intravenous BID   heparin  5,000 Units Subcutaneous Q8H   insulin aspart  0-15 Units Subcutaneous TID WC   insulin glargine  10 Units Subcutaneous Daily   losartan  100 mg Oral Daily   metolazone  5 mg Oral Daily   metoprolol tartrate  50 mg Oral BID   Ensure Max Protein  11 oz Oral BID   sodium chloride flush  3 mL Intravenous Q12H   Continuous Infusions:  sodium chloride     PRN Meds: sodium chloride, acetaminophen, ondansetron (ZOFRAN) IV, sodium chloride flush   Vital Signs    Vitals:   07/09/20 1132 07/09/20 1536 07/09/20 1922 07/10/20 0344  BP: 134/78 (!) 147/84 135/87 (!) 141/99  Pulse: (!) 59 74 64 (!) 102  Resp: 18 18 18 16   Temp: 98.4 F (36.9 C) 98 F (36.7 C) 97.8 F (36.6 C) 98 F (36.7 C)  TempSrc:   Oral Oral  SpO2: 98% 96% 98% 96%  Weight:    123.4 kg  Height:        Intake/Output Summary (Last 24 hours) at 07/10/2020 0724 Last data filed at 07/09/2020 1738 Gross per 24 hour  Intake 720 ml  Output 2300 ml  Net -1580 ml   Filed Weights   07/08/20 1800 07/09/20 0406 07/10/20 0344  Weight: 115.9 kg 123.6 kg 123.4 kg    Telemetry    Sinus rhythm with short runs of atrial tach, PACs, and PVCs - Personally Reviewed  ECG    No new tracings - Personally  Reviewed  Physical Exam   GEN: No acute distress.   Neck: JVD elevated approximately 10 cm. Cardiac: RRR, no murmurs, rubs, or gallops.  Respiratory:  Diminished breath sounds bilaterally at the bases with scattered crackles.  GI: Soft, nontender, non-distended.   MS:  Improved bilateral lower extremity edema with persistent trace pretibial edema; No deformity. Neuro:  Alert and oriented x 3; Nonfocal.  Psych: Normal affect.  Labs    Chemistry Recent Labs  Lab 07/08/20 0552 07/09/20 0547 07/10/20 0551  NA 142 142 140  K 3.7 3.6 3.5  CL 103 101 100  CO2 29 30 31   GLUCOSE 173* 135* 125*  BUN 49* 48* 48*  CREATININE 2.68* 2.43* 2.37*  CALCIUM 8.5* 8.6* 8.6*  GFRNONAA 25* 29* 29*  ANIONGAP 10 11 9      Hematology Recent Labs  Lab 07/08/20 0552 07/09/20 0547 07/10/20 0551  WBC 6.5 6.0 6.2  RBC 3.48* 3.52* 3.66*  HGB 10.0* 10.1* 10.7*  HCT 32.1* 32.2* 33.0*  MCV 92.2 91.5 90.2  MCH 28.7 28.7 29.2  MCHC 31.2 31.4 32.4  RDW 14.3 14.3 14.2  PLT 253 264 260    Cardiac  EnzymesNo results for input(s): TROPONINI in the last 168 hours. No results for input(s): TROPIPOC in the last 168 hours.   BNP Recent Labs  Lab 07/05/20 1709  BNP 866.4*     DDimer No results for input(s): DDIMER in the last 168 hours.   Radiology    No results found.  Cardiac Studies   2D echo 06/01/2020: 1. Left ventricular ejection fraction, by estimation, is 50 to 55%. The  left ventricle has low normal function. The left ventricle has no regional  wall motion abnormalities. There is moderate left ventricular hypertrophy.  Left ventricular diastolic  parameters were normal.   2. Right ventricular systolic function is normal. The right ventricular  size is normal.   3. Left atrial size was moderately dilated.   4. The mitral valve is abnormal. Moderate to severe mitral valve  regurgitation.   5. The aortic valve is normal in structure. Aortic valve regurgitation is  not visualized.    Patient Profile     65 y.o. 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   Assessment & Plan    1.  Acute on chronic HFpEF: -Volume status is improving, though he does remain volume up -Continue IV Lasix with metolazone until there is evidence he is no longer volume overloaded or signs of intravascular volume depletion -At time of discharge, would transition to torsemide 100 mg daily without metolazone  -Hypertension management as below -Not on MRA in the setting of CKD  -CHF education, history of high fluid intake  -Recommend standing scale weight, suspect his weights are inaccurate currently   2.  HTN: -Blood pressure elevated at times -Diurese as above -Continue current medical therapy -If needed, could transition from losartan to [facility], could also change metoprolol to Coreg  3.  Acute on CKD stage III-IV: -Renal function improving with diuresis  -Monitor daily   4.  Mitral regurgitation: -Felt to be dynamic -Consider repeating a limited echo as an outpatient to reassess -If his MR still appears moderate to severe, he would need a TEE to further evaluate   5.  Sleep disordered breathing: -Needs outpatient sleep study  6. LVH: -Needs outpatient cMRI/amyloid workup  -Consider referral to advanced heart failure clinic based on results    For questions or updates, please contact [facility] Please consult [email or web address] for contact info under Cardiology/STEMI.    Signed, Christell Faith, PA-C [facility] Pager: [phone] 07/10/2020, 7:24 AM  Patient examined chart reviewed Patient frustrated and wants to go home Still volume overloaded Exam with loud apical MR murmur Plus 2 tense LE edema Continue diuresis Agree he may be a mitral clip candidate for functional MR If it does not improve with  better medical Rx   Jenkins Rouge MD [facility]

## 2020-07-10 NOTE — Progress Notes (Signed)
PROGRESS NOTE    Victor Hernandez  UUV:[id number] DOB: 10/11/1955 DOA: 07/05/2020 PCP: Remi Haggard, FNP   Brief Narrative: Taken from prior notes [name] a 65 y.o.malewith medical history significant of hypertension, type 2 diabetes mellitus,CHF with Pef,CKDIV, morbid obesity who presents to ED with complaint of shortness of breath and progressive swelling of lower extremities and scrotum. Per chart patient has heart failure clinic follow up on 1/31 but was a no show for this appointment. Patient also has interim history of recent admission 1/3-06/02/2020 with diagnosis of Hypertensive crisis with associated hypertensive encephalopathy and acute on chronic diastolic heart failure exacerbation with associated acute hypoxic respiratory failure , patient was treated with lasix iv as well as IV hypertensive with improvement in symptoms with discharge with follow with Heart failure clinic for which he was a no show. Currently patient in addition to have above complaints notes DOE, orthopnea,and PND. He notes no associated chest pain, n/v/d/cough /fever/chills/ uri symptoms,dysuria or abdominal pain.He also noted that he has note being on a fluid or salt restriction. He also notes as an out patient he has had his diuretic increased but noted no change in his swelling. He also states he has been complaint with his medications.  Continue to gain weight despite being started on IV Lasix, not sure about the measurement, continue to have significant edema.  Subjective: Patient was sitting in chair when seen today.  No new complaint.  Continue to have good urinary output.  Assessment & Plan:   Active Problems:   CHF (congestive heart failure) (HCC)   Shortness of breath  Acute on chronic HFrEF. Remained significantly volume overload with a lot of edema involving thighs and belly. Improved urinary output, net negative of more than 6L.  Weight highly unreliable. Cardiology was also  consulted-appreciate their recommendations. -Continue with 80 mg IV Lasix twice daily. -Continue metolazone -Strict intake and output -Daily BMP -Continue with home dose of metoprolol and losartan  Mitral regurgitation.  Concern of severe mitral regurgitation is also contributing to his symptoms. Cardiology is recommending TEE for further evaluation of his mitral valve once become euvolemic.  Essential hypertension.  Blood pressure mildly elevated. -Continue home dose of metoprolol, amlodipine and losartan. -Continue to monitor  CKD stage IV.  Creatinine currently stable around baseline. -Monitor renal function while he is being diuresed. -Avoid nephrotoxins  Anemia of chronic disease.  Hemoglobin currently stable -Continue to monitor  Type 2 diabetes.  CBG with some improvement.  Insulin was stopped at last hospitalization. -Continue with Lantus 10 units at bedtime which was started during current hospitalization. -Continue with SSI  Morbid obesity. Body mass index is 43.9 kg/m.   Objective: Vitals:   07/09/20 1922 07/10/20 0344 07/10/20 0737 07/10/20 1113  BP: 135/87 (!) 141/99 (!) 146/90 130/84  Pulse: 64 (!) 102 (!) 107 (!) 102  Resp: 18 16 16 18   Temp: 97.8 F (36.6 C) 98 F (36.7 C) 98.4 F (36.9 C) 98.3 F (36.8 C)  TempSrc: Oral Oral Oral Oral  SpO2: 98% 96% 96% 99%  Weight:  123.4 kg    Height:        Intake/Output Summary (Last 24 hours) at 07/10/2020 1508 Last data filed at 07/10/2020 1330 Gross per 24 hour  Intake 480 ml  Output 4050 ml  Net -3570 ml   Filed Weights   07/08/20 1800 07/09/20 0406 07/10/20 0344  Weight: 115.9 kg 123.6 kg 123.4 kg    Examination:  General.  Obese  gentleman, in no acute distress. Pulmonary.  Lungs clear bilaterally, normal respiratory effort. CV.  Regular rate and rhythm, no JVD, rub or murmur. Abdomen.  Soft, nontender, nondistended, BS positive. CNS.  Alert and oriented x3.  No focal neurologic  deficit. Extremities.  1+ LE and 2+ thigh and Belly edema. Psychiatry.  Judgment and insight appears normal.  DVT prophylaxis: Heparin Code Status: Full Family Communication: Discussed with patient Disposition Plan:  Status is: Inpatient  Remains inpatient appropriate because:Inpatient level of care appropriate due to severity of illness   Dispo: The patient is from: Home              Anticipated d/c is to: Home              Anticipated d/c date is: 3 days              Patient currently is not medically stable to d/c.   Difficult to place patient No              Level of care: Med-Surg  Consultants:   Cardiology  Procedures:  Antimicrobials:   Data Reviewed: I have personally reviewed following labs and imaging studies  CBC: Recent Labs  Lab 07/05/20 1709 07/07/20 0516 07/08/20 0552 07/09/20 0547 07/10/20 0551  WBC 8.5 6.4 6.5 6.0 6.2  HGB 11.5* 10.9* 10.0* 10.1* 10.7*  HCT 37.2* 35.4* 32.1* 32.2* 33.0*  MCV 93.5 93.4 92.2 91.5 90.2  PLT 262 262 253 264 852   Basic Metabolic Panel: Recent Labs  Lab 07/06/20 0459 07/07/20 0516 07/08/20 0552 07/09/20 0547 07/10/20 0551  NA 141 143 142 142 140  K 3.9 3.6 3.7 3.6 3.5  CL 105 106 103 101 100  CO2 29 27 29 30 31   GLUCOSE 377* 95 173* 135* 125*  BUN 55* 50* 49* 48* 48*  CREATININE 2.85* 2.60* 2.68* 2.43* 2.37*  CALCIUM 8.3* 8.4* 8.5* 8.6* 8.6*  MG 1.9  --   --   --   --    GFR: Estimated Creatinine Clearance: 38 mL/min (A) (by C-G formula based on SCr of 2.37 mg/dL (H)). Liver Function Tests: No results for input(s): AST, ALT, ALKPHOS, BILITOT, PROT, ALBUMIN in the last 168 hours. No results for input(s): LIPASE, AMYLASE in the last 168 hours. No results for input(s): AMMONIA in the last 168 hours. Coagulation Profile: No results for input(s): INR, PROTIME in the last 168 hours. Cardiac Enzymes: No results for input(s): CKTOTAL, CKMB, CKMBINDEX, TROPONINI in the last 168 hours. BNP (last 3 results) No  results for input(s): PROBNP in the last 8760 hours. HbA1C: No results for input(s): HGBA1C in the last 72 hours. CBG: Recent Labs  Lab 07/09/20 1130 07/09/20 1633 07/09/20 1924 07/10/20 0739 07/10/20 1112  GLUCAP 167* 204* 172* 132* 214*   Lipid Profile: No results for input(s): CHOL, HDL, LDLCALC, TRIG, CHOLHDL, LDLDIRECT in the last 72 hours. Thyroid Function Tests: No results for input(s): TSH, T4TOTAL, FREET4, T3FREE, THYROIDAB in the last 72 hours. Anemia Panel: No results for input(s): VITAMINB12, FOLATE, FERRITIN, TIBC, IRON, RETICCTPCT in the last 72 hours. Sepsis Labs: No results for input(s): PROCALCITON, LATICACIDVEN in the last 168 hours.  Recent Results (from the past 240 hour(s))  SARS CORONAVIRUS 2 (TAT 6-24 HRS) Nasopharyngeal Nasopharyngeal Swab     Status: None   Collection Time: 07/05/20 11:21 PM   Specimen: Nasopharyngeal Swab  Result Value Ref Range Status   SARS Coronavirus 2 NEGATIVE NEGATIVE Final    Comment: (NOTE)  SARS-CoV-2 target nucleic acids are NOT DETECTED.  The SARS-CoV-2 RNA is generally detectable in upper and lower respiratory specimens during the acute phase of infection. Negative results do not preclude SARS-CoV-2 infection, do not rule out co-infections with other pathogens, and should not be used as the sole basis for treatment or other patient management decisions. Negative results must be combined with clinical observations, patient history, and epidemiological information. The expected result is Negative.  Fact Sheet for Patients: SugarRoll.be  Fact Sheet for Healthcare Providers: https://www.woods-mathews.com/  This test is not yet approved or cleared by the [place] FDA and  has been authorized for detection and/or diagnosis of SARS-CoV-2 by FDA under an Emergency Use Authorization (EUA). This EUA will remain  in effect (meaning this test can be used) for the duration of  the COVID-19 declaration under Se ction 564(b)(1) of the Act, 21 U.S.C. section 360bbb-3(b)(1), unless the authorization is terminated or revoked sooner.  Performed at [facility], [place] [address], [place],  [address]      Radiology Studies: No results found.  Scheduled Meds: . amLODipine  5 mg Oral BID  . aspirin EC  81 mg Oral Daily  . atorvastatin  40 mg Oral QHS  . furosemide  80 mg Intravenous BID  . heparin  5,000 Units Subcutaneous Q8H  . insulin aspart  0-15 Units Subcutaneous TID WC  . insulin glargine  10 Units Subcutaneous Daily  . losartan  100 mg Oral Daily  . metolazone  5 mg Oral Daily  . metoprolol tartrate  50 mg Oral BID  . Ensure Max Protein  11 oz Oral BID  . sodium chloride flush  3 mL Intravenous Q12H   Continuous Infusions: . sodium chloride       LOS: 5 days   Time spent: 25 minutes.  Lorella Nimrod, MD [facility]  If 7PM-7AM, please contact night-coverage [email or web address]  07/10/2020, 3:08 PM   This record has been created using Systems analyst. Errors have been sought and corrected,but may not always be located. Such creation errors do not reflect on the standard of care.

## 2020-07-10 NOTE — Plan of Care (Signed)
  Problem: Clinical Measurements: Goal: Respiratory complications will improve Outcome: Progressing   Problem: Activity: Goal: Risk for activity intolerance will decrease Outcome: Progressing   Problem: Nutrition: Goal: Adequate nutrition will be maintained Outcome: Progressing   Problem: Coping: Goal: Level of anxiety will decrease Outcome: Progressing   Problem: Education: Goal: Ability to demonstrate management of disease process will improve Outcome: Progressing   Problem: Activity: Goal: Capacity to carry out activities will improve Outcome: Progressing

## 2020-07-11 LAB — CBC
HCT: 32.2 % — ABNORMAL LOW (ref 39.0–52.0)
Hemoglobin: 10.6 g/dL — ABNORMAL LOW (ref 13.0–17.0)
MCH: 29.7 pg (ref 26.0–34.0)
MCHC: 32.9 g/dL (ref 30.0–36.0)
MCV: 90.2 fL (ref 80.0–100.0)
Platelets: 270 10*3/uL (ref 150–400)
RBC: 3.57 MIL/uL — ABNORMAL LOW (ref 4.22–5.81)
RDW: 14.2 % (ref 11.5–15.5)
WBC: 6.5 10*3/uL (ref 4.0–10.5)
nRBC: 0 % (ref 0.0–0.2)

## 2020-07-11 LAB — GLUCOSE, CAPILLARY
Glucose-Capillary: 117 mg/dL — ABNORMAL HIGH (ref 70–99)
Glucose-Capillary: 198 mg/dL — ABNORMAL HIGH (ref 70–99)
Glucose-Capillary: 208 mg/dL — ABNORMAL HIGH (ref 70–99)
Glucose-Capillary: 182 mg/dL — ABNORMAL HIGH (ref 70–99)

## 2020-07-11 LAB — BASIC METABOLIC PANEL
Anion gap: 10 (ref 5–15)
BUN: 50 mg/dL — ABNORMAL HIGH (ref 8–23)
CO2: 32 mmol/L (ref 22–32)
Calcium: 8.5 mg/dL — ABNORMAL LOW (ref 8.9–10.3)
Chloride: 98 mmol/L (ref 98–111)
Creatinine, Ser: 2.39 mg/dL — ABNORMAL HIGH (ref 0.61–1.24)
GFR, Estimated: 29 mL/min — ABNORMAL LOW (ref >60)
Glucose, Bld: 110 mg/dL — ABNORMAL HIGH (ref 70–99)
Potassium: 3.5 mmol/L (ref 3.5–5.1)
Sodium: 140 mmol/L (ref 135–145)

## 2020-07-11 NOTE — Progress Notes (Addendum)
Progress Note  Patient Name: Victor Hernandez Date of Encounter: 07/11/2020  Primary Cardiologist: End  Subjective   Dyspnea and extremity swelling are improving.  He cannot tell any difference in breathing at this point.  Able to sleep mostly supine.  Suspect weights have been inaccurate during his admission.  Documented urine output 3.4 L for the past 24 hours with a net -10 L for the admission.  Renal function is stable.  Anemia stable, though hemoglobin is down 4 g from early January.  Wants to go home today.   Inpatient Medications    Scheduled Meds:  amLODipine  5 mg Oral BID   aspirin EC  81 mg Oral Daily   atorvastatin  40 mg Oral QHS   furosemide  80 mg Intravenous BID   heparin  5,000 Units Subcutaneous Q8H   insulin aspart  0-15 Units Subcutaneous TID WC   insulin glargine  10 Units Subcutaneous Daily   losartan  100 mg Oral Daily   metolazone  5 mg Oral Daily   metoprolol tartrate  50 mg Oral BID   Ensure Max Protein  11 oz Oral BID   sodium chloride flush  3 mL Intravenous Q12H   Continuous Infusions:  sodium chloride     PRN Meds: sodium chloride, acetaminophen, ondansetron (ZOFRAN) IV, sodium chloride flush   Vital Signs    Vitals:   07/10/20 1628 07/10/20 2019 07/11/20 0550 07/11/20 0740  BP: 131/72 (!) 143/84 (!) 124/106 (!) 159/79  Pulse: (!) 107 (!) 59 71 71  Resp: 18 17 18 18   Temp: 98.1 F (36.7 C) 98.6 F (37 C) 97.8 F (36.6 C) 98.1 F (36.7 C)  TempSrc: Oral  Oral   SpO2: 100% 95% 94% 100%  Weight:   123.8 kg   Height:        Intake/Output Summary (Last 24 hours) at 07/11/2020 0918 Last data filed at 07/10/2020 1820 Gross per 24 hour  Intake 720 ml  Output 4200 ml  Net -3480 ml   Filed Weights   07/09/20 0406 07/10/20 0344 07/11/20 0550  Weight: 123.6 kg 123.4 kg 123.8 kg    Telemetry    Sinus rhythm with short runs of atrial tach - Personally Reviewed  ECG    No new tracings - Personally Reviewed  Physical Exam   GEN: No  acute distress.   Neck: JVD elevated approximately 10 cm. Cardiac: RRR, II/VI systolic murmur LSB, no rubs, or gallops.  Respiratory:  Diminished breath sounds bilaterally at the bases with scattered crackles.  GI: Soft, nontender, non-distended.   MS:  Improved bilateral lower extremity edema with persistent trace pretibial edema; No deformity. Neuro:  Alert and oriented x 3; Nonfocal.  Psych: Normal affect.  Labs    Chemistry Recent Labs  Lab 07/09/20 0547 07/10/20 0551 07/11/20 0525  NA 142 140 140  K 3.6 3.5 3.5  CL 101 100 98  CO2 30 31 32  GLUCOSE 135* 125* 110*  BUN 48* 48* 50*  CREATININE 2.43* 2.37* 2.39*  CALCIUM 8.6* 8.6* 8.5*  GFRNONAA 29* 29* 29*  ANIONGAP 11 9 10      Hematology Recent Labs  Lab 07/09/20 0547 07/10/20 0551 07/11/20 0525  WBC 6.0 6.2 6.5  RBC 3.52* 3.66* 3.57*  HGB 10.1* 10.7* 10.6*  HCT 32.2* 33.0* 32.2*  MCV 91.5 90.2 90.2  MCH 28.7 29.2 29.7  MCHC 31.4 32.4 32.9  RDW 14.3 14.2 14.2  PLT 264 260 270  Cardiac EnzymesNo results for input(s): TROPONINI in the last 168 hours. No results for input(s): TROPIPOC in the last 168 hours.   BNP Recent Labs  Lab 07/05/20 1709  BNP 866.4*     DDimer No results for input(s): DDIMER in the last 168 hours.   Radiology    No results found.  Cardiac Studies   2D echo 06/01/2020: 1. Left ventricular ejection fraction, by estimation, is 50 to 55%. The  left ventricle has low normal function. The left ventricle has no regional  wall motion abnormalities. There is moderate left ventricular hypertrophy.  Left ventricular diastolic  parameters were normal.   2. Right ventricular systolic function is normal. The right ventricular  size is normal.   3. Left atrial size was moderately dilated.   4. The mitral valve is abnormal. Moderate to severe mitral valve  regurgitation.   5. The aortic valve is normal in structure. Aortic valve regurgitation is  not visualized.   Patient Profile      65 y.o. 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   Assessment & Plan    1.  Acute on chronic HFpEF: -Volume status is improving, though he remains volume up -Renal function fairly stable -Continue IV Lasix 80 mg bid with metolazone, possibly for one more day pending renal function  -Place on torsemide 100 mg daily starting 2/14, without metolazone  -Hypertension management as below -Not on MRA in the setting of CKD  -CHF education, history of high fluid intake  -Recommend standing scale weight, suspect his weights are inaccurate currently   2.  HTN: -Blood pressure elevated at times -Diurese as above -Continue current medical therapy -If needed, could transition from losartan to [facility], could also change metoprolol to Coreg  3.  Acute on CKD stage III-IV: -Renal function improved with diuresis -Monitor  4.  Mitral regurgitation: -Felt to be dynamic -Recommend repeating a limited echo as an outpatient to reassess -If his MR still appears moderate to severe, he would need a TEE to further evaluate as he may be a candidate for MitraClip  5.  Sleep disordered breathing: -Needs outpatient sleep study  6. LVH: -Needs outpatient cMRI/amyloid workup  -Consider referral to advanced heart failure clinic based on results   7. Anemia: -No obvious bleeding -Stable -Will need outpatient workup -Contributing to his overall presentation    For questions or updates, please contact [place] Please consult [email or web address] for contact info under Cardiology/STEMI.    Signed, Christell Faith, PA-C [facility] Pager: [phone] 07/11/2020, 9:18 AM  Patient examined chart reviewed Basilar atelectasis significant apical MR murmur and LE edema persists continue iv diuresis today and transition To PO 2/14  Would  benefit from outpatient f/u TEE once his CHF is stabilized to further assess degree of MR and possible need for clip evaluation   Jenkins Rouge MD [facility]

## 2020-07-11 NOTE — Progress Notes (Signed)
Mobility Specialist - Progress Note   07/11/20 1500  Mobility  Activity Ambulated in hall  Level of Assistance Independent  Assistive Device None  Distance Ambulated (ft) 240 ft  Mobility Response Tolerated well  Mobility performed by Mobility specialist  $Mobility charge 1 Mobility    Pre-mobility: 70 HR, 96% SpO2 During mobility: 110 HR, 96% SpO2 Post-mobility: 105 HR, 97% SpO2   Pt was standing at bedside upon arrival utilizing room air. Pt agreed to session. Pt denied pain, nausea, and fatigue this date. Pt states he's been ambulating as much as possible in between sessions. Pt ambulated 240' in room/hallway with no LOB noted. Pt denied dizziness, weakness in LE, and SOB throughout activity. Overall, pt tolerated session well. Pt was left in recliner with all needs in reach.    Kathee Delton Mobility Specialist 07/11/20, 3:26 PM

## 2020-07-11 NOTE — Progress Notes (Signed)
PROGRESS NOTE    Victor Hernandez  DDU:[id number] DOB: 10-25-1955 DOA: 07/05/2020 PCP: Remi Haggard, FNP   Brief Narrative: Taken from prior notes Victor Hernandez a 65 y.o.malewith medical history significant of hypertension, type 2 diabetes mellitus,CHF with Pef,CKDIV, morbid obesity who presents to ED with complaint of shortness of breath and progressive swelling of lower extremities and scrotum. Per chart patient has heart failure clinic follow up on 1/31 but was a no show for this appointment. Patient also has interim history of recent admission 1/3-06/02/2020 with diagnosis of Hypertensive crisis with associated hypertensive encephalopathy and acute on chronic diastolic heart failure exacerbation with associated acute hypoxic respiratory failure , patient was treated with lasix iv as well as IV hypertensive with improvement in symptoms with discharge with follow with Heart failure clinic for which he was a no show. Currently patient in addition to have above complaints notes DOE, orthopnea,and PND. He notes no associated chest pain, n/v/d/cough /fever/chills/ uri symptoms,dysuria or abdominal pain.He also noted that he has note being on a fluid or salt restriction. He also notes as an out patient he has had his diuretic increased but noted no change in his swelling. He also states he has been complaint with his medications.  Continue to gain weight despite being started on IV Lasix, not sure about the measurement, continue to have significant edema.  Subjective: Patient has no new complaint today. He wants to go home, discussed that cardiology is recommending one more day of IV diuresis at least. He told me that he will think about it and then let us know.  Assessment & Plan:   Active Problems:   CHF (congestive heart failure) (HCC)   Shortness of breath  Acute on chronic HFrEF. Remained significantly volume overload with a lot of edema involving thighs and belly. Improved urinary output,  net negative of more than 10L.  Weight highly unreliable. Cardiology was also consulted-appreciate their recommendations. -Continue with 80 mg IV Lasix twice daily. -Continue metolazone -Strict intake and output -Daily BMP -Continue with home dose of metoprolol and losartan  Mitral regurgitation.  Concern of severe mitral regurgitation is also contributing to his symptoms. Cardiology is recommending TEE for further evaluation of his mitral valve once become euvolemic.  Essential hypertension.  Blood pressure mildly elevated. -Continue home dose of metoprolol, amlodipine and losartan. -Continue to monitor  CKD stage IV.  Creatinine currently stable around baseline. -Monitor renal function while he is being diuresed. -Avoid nephrotoxins  Anemia of chronic disease.  Hemoglobin currently stable -Continue to monitor  Type 2 diabetes.  CBG with some improvement.  Insulin was stopped at last hospitalization. -Continue with Lantus 10 units at bedtime which was started during current hospitalization. -Continue with SSI  Morbid obesity. Body mass index is 44.05 kg/m.  This will complicate overall prognosis.   Objective: Vitals:   07/10/20 2019 07/11/20 0550 07/11/20 0740 07/11/20 1155  BP: (!) 143/84 (!) 124/106 (!) 159/79 (!) 150/75  Pulse: (!) 59 71 71 75  Resp: 17 18 18 19   Temp: 98.6 F (37 C) 97.8 F (36.6 C) 98.1 F (36.7 C) 98.3 F (36.8 C)  TempSrc:  Oral Oral Oral  SpO2: 95% 94% 100% 100%  Weight:  123.8 kg    Height:        Intake/Output Summary (Last 24 hours) at 07/11/2020 1511 Last data filed at 07/11/2020 1417 Gross per 24 hour  Intake 720 ml  Output 2825 ml  Net -2105 ml   Filed  Weights   07/09/20 0406 07/10/20 0344 07/11/20 0550  Weight: 123.6 kg 123.4 kg 123.8 kg    Examination:  General. Morbidly obese gentleman, in no acute distress. Pulmonary.  Lungs clear bilaterally, normal respiratory effort. CV.  Regular rate and rhythm, no JVD, rub or  murmur. Abdomen.  Soft, nontender, nondistended, BS positive. CNS.  Alert and oriented x3.  No focal neurologic deficit. Extremities. Less LE edema, 2+ thigh edema. Psychiatry.  Judgment and insight appears normal.  DVT prophylaxis: Heparin Code Status: Full Family Communication: Discussed with patient Disposition Plan:  Status is: Inpatient  Remains inpatient appropriate because:Inpatient level of care appropriate due to severity of illness   Dispo: The patient is from: Home              Anticipated d/c is to: Home              Anticipated d/c date is: 1-2 days.              Patient currently is not medically stable to d/c.   Difficult to place patient No              Level of care: Med-Surg  Consultants:   Cardiology  Procedures:  Antimicrobials:   Data Reviewed: I have personally reviewed following labs and imaging studies  CBC: Recent Labs  Lab 07/07/20 0516 07/08/20 0552 07/09/20 0547 07/10/20 0551 07/11/20 0525  WBC 6.4 6.5 6.0 6.2 6.5  HGB 10.9* 10.0* 10.1* 10.7* 10.6*  HCT 35.4* 32.1* 32.2* 33.0* 32.2*  MCV 93.4 92.2 91.5 90.2 90.2  PLT 262 253 264 260 127   Basic Metabolic Panel: Recent Labs  Lab 07/06/20 0459 07/07/20 0516 07/08/20 0552 07/09/20 0547 07/10/20 0551 07/11/20 0525  NA 141 143 142 142 140 140  K 3.9 3.6 3.7 3.6 3.5 3.5  CL 105 106 103 101 100 98  CO2 29 27 29 30 31  32  GLUCOSE 377* 95 173* 135* 125* 110*  BUN 55* 50* 49* 48* 48* 50*  CREATININE 2.85* 2.60* 2.68* 2.43* 2.37* 2.39*  CALCIUM 8.3* 8.4* 8.5* 8.6* 8.6* 8.5*  MG 1.9  --   --   --   --   --    GFR: Estimated Creatinine Clearance: 37.8 mL/min (A) (by C-G formula based on SCr of 2.39 mg/dL (H)). Liver Function Tests: No results for input(s): AST, ALT, ALKPHOS, BILITOT, PROT, ALBUMIN in the last 168 hours. No results for input(s): LIPASE, AMYLASE in the last 168 hours. No results for input(s): AMMONIA in the last 168 hours. Coagulation Profile: No results for  input(s): INR, PROTIME in the last 168 hours. Cardiac Enzymes: No results for input(s): CKTOTAL, CKMB, CKMBINDEX, TROPONINI in the last 168 hours. BNP (last 3 results) No results for input(s): PROBNP in the last 8760 hours. HbA1C: No results for input(s): HGBA1C in the last 72 hours. CBG: Recent Labs  Lab 07/10/20 1112 07/10/20 1625 07/10/20 2016 07/11/20 0740 07/11/20 1154  GLUCAP 214* 185* 179* 117* 198*   Lipid Profile: No results for input(s): CHOL, HDL, LDLCALC, TRIG, CHOLHDL, LDLDIRECT in the last 72 hours. Thyroid Function Tests: No results for input(s): TSH, T4TOTAL, FREET4, T3FREE, THYROIDAB in the last 72 hours. Anemia Panel: No results for input(s): VITAMINB12, FOLATE, FERRITIN, TIBC, IRON, RETICCTPCT in the last 72 hours. Sepsis Labs: No results for input(s): PROCALCITON, LATICACIDVEN in the last 168 hours.  Recent Results (from the past 240 hour(s))  SARS CORONAVIRUS 2 (TAT 6-24 HRS) Nasopharyngeal Nasopharyngeal Swab  Status: None   Collection Time: 07/05/20 11:21 PM   Specimen: Nasopharyngeal Swab  Result Value Ref Range Status   SARS Coronavirus 2 NEGATIVE NEGATIVE Final    Comment: (NOTE) SARS-CoV-2 target nucleic acids are NOT DETECTED.  The SARS-CoV-2 RNA is generally detectable in upper and lower respiratory specimens during the acute phase of infection. Negative results do not preclude SARS-CoV-2 infection, do not rule out co-infections with other pathogens, and should not be used as the sole basis for treatment or other patient management decisions. Negative results must be combined with clinical observations, patient history, and epidemiological information. The expected result is Negative.  Fact Sheet for Patients: SugarRoll.be  Fact Sheet for Healthcare Providers: https://www.woods-mathews.com/  This test is not yet approved or cleared by the [place] FDA and  has been authorized for detection  and/or diagnosis of SARS-CoV-2 by FDA under an Emergency Use Authorization (EUA). This EUA will remain  in effect (meaning this test can be used) for the duration of the COVID-19 declaration under Se ction 564(b)(1) of the Act, 21 U.S.C. section 360bbb-3(b)(1), unless the authorization is terminated or revoked sooner.  Performed at [facility], [place] [address], [place] [address]      Radiology Studies: No results found.  Scheduled Meds: . amLODipine  5 mg Oral BID  . aspirin EC  81 mg Oral Daily  . atorvastatin  40 mg Oral QHS  . furosemide  80 mg Intravenous BID  . heparin  5,000 Units Subcutaneous Q8H  . insulin aspart  0-15 Units Subcutaneous TID WC  . insulin glargine  10 Units Subcutaneous Daily  . losartan  100 mg Oral Daily  . metolazone  5 mg Oral Daily  . metoprolol tartrate  50 mg Oral BID  . Ensure Max Protein  11 oz Oral BID  . sodium chloride flush  3 mL Intravenous Q12H   Continuous Infusions: . sodium chloride       LOS: 6 days   Time spent: 25 minutes.  Lorella Nimrod, MD [facility]  If 7PM-7AM, please contact night-coverage [email or web address]  07/11/2020, 3:11 PM   This record has been created using Systems analyst. Errors have been sought and corrected,but may not always be located. Such creation errors do not reflect on the standard of care.

## 2020-07-12 DIAGNOSIS — J9601 Acute respiratory failure with hypoxia: Secondary | ICD-10-CM

## 2020-07-12 DIAGNOSIS — J810 Acute pulmonary edema: Secondary | ICD-10-CM

## 2020-07-12 LAB — BASIC METABOLIC PANEL
Anion gap: 7 (ref 5–15)
BUN: 54 mg/dL — ABNORMAL HIGH (ref 8–23)
CO2: 36 mmol/L — ABNORMAL HIGH (ref 22–32)
Calcium: 8.6 mg/dL — ABNORMAL LOW (ref 8.9–10.3)
Chloride: 96 mmol/L — ABNORMAL LOW (ref 98–111)
Creatinine, Ser: 2.52 mg/dL — ABNORMAL HIGH (ref 0.61–1.24)
GFR, Estimated: 27 mL/min — ABNORMAL LOW (ref >60)
Glucose, Bld: 137 mg/dL — ABNORMAL HIGH (ref 70–99)
Potassium: 3.5 mmol/L (ref 3.5–5.1)
Sodium: 139 mmol/L (ref 135–145)

## 2020-07-12 LAB — GLUCOSE, CAPILLARY
Glucose-Capillary: 132 mg/dL — ABNORMAL HIGH (ref 70–99)
Glucose-Capillary: 167 mg/dL — ABNORMAL HIGH (ref 70–99)

## 2020-07-12 MED ORDER — TORSEMIDE 20 MG PO TABS
100.0000 mg | ORAL_TABLET | Freq: Every day | ORAL | Status: DC
  Administered 2020-07-12: 100 mg via ORAL
  Filled 2020-07-12: qty 5

## 2020-07-12 MED ORDER — ASPIRIN EC 81 MG PO TBEC: 81.0000 mg | DELAYED_RELEASE_TABLET | Freq: Every day | ORAL | 11 refills | Status: AC

## 2020-07-12 MED ORDER — AMLODIPINE BESYLATE 5 MG PO TABS: 5.0000 mg | ORAL_TABLET | Freq: Two times a day (BID) | ORAL | 1 refills | Status: DC

## 2020-07-12 MED ORDER — TORSEMIDE 100 MG PO TABS: 100.0000 mg | ORAL_TABLET | Freq: Every day | ORAL | 1 refills | Status: DC

## 2020-07-12 NOTE — Progress Notes (Signed)
Progress Note  Patient Name: Victor Hernandez Date of Encounter: 07/12/2020  [facility] Cardiologist: Dr. Saunders Revel  Subjective   Patient put out 2.6L overnight, new -11.9L. Volume status is much better. Plan to switch to oral diuretic today. Creatinine relatively stable. No chest pain. Breathing is good.   Inpatient Medications    Scheduled Meds: . amLODipine  5 mg Oral BID  . aspirin EC  81 mg Oral Daily  . atorvastatin  40 mg Oral QHS  . furosemide  80 mg Intravenous BID  . heparin  5,000 Units Subcutaneous Q8H  . insulin aspart  0-15 Units Subcutaneous TID WC  . insulin glargine  10 Units Subcutaneous Daily  . losartan  100 mg Oral Daily  . metolazone  5 mg Oral Daily  . metoprolol tartrate  50 mg Oral BID  . Ensure Max Protein  11 oz Oral BID  . sodium chloride flush  3 mL Intravenous Q12H   Continuous Infusions: . sodium chloride     PRN Meds: sodium chloride, acetaminophen, ondansetron (ZOFRAN) IV, sodium chloride flush   Vital Signs    Vitals:   07/11/20 1155 07/11/20 1600 07/11/20 2003 07/12/20 0438  BP: (!) 150/75 136/81 130/81 136/82  Pulse: 75 62 66 99  Resp: 19 19 19 16   Temp: 98.3 F (36.8 C) 98.2 F (36.8 C) 98 F (36.7 C) 98.1 F (36.7 C)  TempSrc: Oral Oral Oral Oral  SpO2: 100% 100% 100% 98%  Weight:    123.8 kg  Height:        Intake/Output Summary (Last 24 hours) at 07/12/2020 0735 Last data filed at 07/11/2020 1815 Gross per 24 hour  Intake 720 ml  Output 2600 ml  Net -1880 ml   Last 3 Weights 07/12/2020 07/11/2020 07/10/2020  Weight (lbs) 273 lb 0.3 oz 272 lb 14.9 oz 272 lb  Weight (kg) 123.84 kg 123.8 kg 123.378 kg      Telemetry    NSR, PACs, PVCs, HR 60-80 - Personally Reviewed  ECG    No new - Personally Reviewed  Physical Exam   GEN: No acute distress.   Neck: No JVD Cardiac: RRR, + murmur, no rubs, or gallops.  Respiratory: Clear to auscultation bilaterally. GI: Soft, nontender, non-distended  MS: Trace lower leg edema;  No deformity. Neuro:  Nonfocal  Psych: Normal affect   Labs    High Sensitivity Troponin:  No results for input(s): TROPONINIHS in the last 720 hours.    Chemistry Recent Labs  Lab 07/10/20 0551 07/11/20 0525 07/12/20 0535  NA 140 140 139  K 3.5 3.5 3.5  CL 100 98 96*  CO2 31 32 36*  GLUCOSE 125* 110* 137*  BUN 48* 50* 54*  CREATININE 2.37* 2.39* 2.52*  CALCIUM 8.6* 8.5* 8.6*  GFRNONAA 29* 29* 27*  ANIONGAP 9 10 7      Hematology Recent Labs  Lab 07/09/20 0547 07/10/20 0551 07/11/20 0525  WBC 6.0 6.2 6.5  RBC 3.52* 3.66* 3.57*  HGB 10.1* 10.7* 10.6*  HCT 32.2* 33.0* 32.2*  MCV 91.5 90.2 90.2  MCH 28.7 29.2 29.7  MCHC 31.4 32.4 32.9  RDW 14.3 14.2 14.2  PLT 264 260 270    BNP Recent Labs  Lab 07/05/20 1709  BNP 866.4*     DDimer No results for input(s): DDIMER in the last 168 hours.   Radiology    No results found.  Cardiac Studies   Echo 06/01/20 1. Left ventricular ejection fraction, by estimation, is  50 to 55%. The  left ventricle has low normal function. The left ventricle has no regional  wall motion abnormalities. There is moderate left ventricular hypertrophy.  Left ventricular diastolic  parameters were normal.  2. Right ventricular systolic function is normal. The right ventricular  size is normal.  3. Left atrial size was moderately dilated.  4. The mitral valve is abnormal. Moderate to severe mitral valve  regurgitation.  5. The aortic valve is normal in structure. Aortic valve regurgitation is  not visualized.    Patient Profile     65 y.o. male with history of HFpEF, moderate to severe MR, CKD stage 3-4, DM2, poorly controlled HTN, HLD, obesity, and remote alcohol and tobacco use who is being seen for chronic HFpEF.  Assessment & Plan    Acute on chronic HFpEF - IV lasix 80 mg BID and metolazone - Renal function mildly elevated today, but overall stable - Patient with good UOP, net -11.9L since admission - Continue  Metoprolol and losartan - Almost euvolemic on exam - PTA lasix 80mg  daily. Will switch IV lasix to Torsemide 100mg  daily  HTN - amlodipine 5mg  daily - Losartan 100mg  daily - metoprolol 50 mg BID - pressures better this AM. Can switch to coreg if better control is needed  Acute on chronic stage 3-4 - Creatinine 2.39>2.52  MR - severe by echo this admission - Recommend repeating limited echo as OP to reassess and if MR is still severe than can pursue further work-up at that time  LVH - can consider Outpatient cMRI  Anemia - No overt bleeding - Hgb stable in the 10s  For questions or updates, please contact [facility] Please consult [email or web address] for contact info under        Signed, Cyara Devoto Ninfa Meeker, PA-C  07/12/2020, 7:35 AM

## 2020-07-12 NOTE — Discharge Summary (Signed)
Physician Discharge Summary  Victor Hernandez ZLW:[id number] DOB: 1955-08-24 DOA: 07/05/2020  PCP: Victor Gang, FNP  Admit date: 07/05/2020 Discharge date: 07/12/2020  Admitted From: Home Disposition: Home  Recommendations for Outpatient Follow-up:  1. Follow up with PCP in 1-2 weeks 2. Follow-up with cardiology 3. Please obtain BMP/CBC in one week 4. Please follow up on the following pending results: None  Home Health: No Equipment/Devices: None Discharge Condition: Stable CODE STATUS: Full Diet recommendation: Heart Healthy / Carb Modified   Brief/Interim Summary: Victor Hernandez a 64 y.o.malewith medical history significant of hypertension, type 2 diabetes mellitus,CHF with Pef,CKDIV, morbid obesity who presents to ED with complaint of shortness of breath and progressive swelling of lower extremities and scrotum. Per chart patient has heart failure clinic follow up on 1/31 but was a no show for this appointment. Patient also has interim history of recent admission 1/3-06/02/2020 with diagnosis of Hypertensive crisis with associated hypertensive encephalopathy and acute on chronic diastolic heart failure exacerbation with associated acute hypoxic respiratory failure , patient was treated with lasix iv as well as IV hypertensive with improvement in symptoms with discharge with follow with Heart failure clinic for which he was a no show. Currently patient in addition to have above complaints notes DOE, orthopnea,and PND. He notes no associated chest pain, n/v/d/cough /fever/chills/ uri symptoms,dysuria or abdominal pain.He also noted that he has note being on a fluid or salt restriction. He also notes as an out patient he has had his diuretic increased but noted no change in his swelling. He also states he has been complaint with his medications. Patient had significant volume overload mostly involving thigh, belly and scrotum.  He was started on IV Lasix, metolazone was later to improve  diuresis.  Diuresed well, net negative of 12 L.  Continue to have significant thigh and belly edema.  His home dose of Lasix was discontinued and he was started on high-dose torsemide.  Patient also has CKD stage IV and need a close follow-up while being diuresed.  Renal function remained stable while in the hospital.  Patient has severe mitral regurgitation and will need a TEE as outpatient for further evaluation of his mitral valve once become euvolemic and to see if he is a candidate for any surgical intervention.  Patient is morbidly obese with BMI of 44 which will complicate overall prognosis. He will need extensive counseling by primary care provider for weight reduction.  He will continue rest of his home meds and follow-up with his providers.  Discharge Diagnoses:  Active Problems:   CHF (congestive heart failure) (HCC)   Shortness of breath   Acute pulmonary edema (HCC)   Acute respiratory failure with hypoxia [facility])   Discharge Instructions  Discharge Instructions    (HEART FAILURE PATIENTS) Call MD:  Anytime you have any of the following symptoms: 1) 3 pound weight gain in 24 hours or 5 pounds in 1 week 2) shortness of breath, with or without a dry hacking cough 3) swelling in the hands, feet or stomach 4) if you have to sleep on extra pillows at night in order to breathe.   Complete by: As directed    Amb Referral to HF Clinic   Complete by: As directed    Diet - low sodium heart healthy   Complete by: As directed    Discharge instructions   Complete by: As directed    It was pleasure taking care of you. Continue taking your medications including which were prescribed  during prior visit and never picked up from pharmacy. Follow-up with your cardiologist and heart failure clinic. Eat low-sodium diet and restrict your fluid to 1500 ml per day.   Heart Failure patients record your daily weight using the same scale at the same time of day   Complete by: As directed     Increase activity slowly   Complete by: As directed    STOP any activity that causes chest pain, shortness of breath, dizziness, sweating, or exessive weakness   Complete by: As directed      Allergies as of 07/12/2020   No Known Allergies     Medication List    STOP taking these medications   furosemide 40 MG tablet Commonly known as: LASIX   nitrofurantoin (macrocrystal-monohydrate) 100 MG capsule Commonly known as: MACROBID     TAKE these medications   amLODipine 5 MG tablet Commonly known as: NORVASC Take 1 tablet (5 mg total) by mouth 2 (two) times daily. What changed:   medication strength  how much to take  when to take this   aspirin EC 81 MG tablet Take 1 tablet (81 mg total) by mouth daily.   atorvastatin 40 MG tablet Commonly known as: LIPITOR Take 40 mg by mouth at bedtime.   blood glucose meter kit and supplies Dispense based on patient and insurance preference. Use up to four times daily as directed. (FOR ICD-10 E10.9, E11.9).   Klor-Con M20 20 MEQ tablet Generic drug: potassium chloride SA Take 1 tablet (20 mEq total) by mouth daily. What changed: how much to take   losartan 100 MG tablet Commonly known as: COZAAR Take 1 tablet (100 mg total) by mouth daily.   metoprolol tartrate 50 MG tablet Commonly known as: LOPRESSOR Take 1 tablet (50 mg total) by mouth 2 (two) times daily.   mupirocin ointment 2 % Commonly known as: BACTROBAN Apply 1 application topically 3 (three) times daily.   spironolactone 25 MG tablet Commonly known as: ALDACTONE Take 25 mg by mouth daily.   torsemide 100 MG tablet Commonly known as: DEMADEX Take 1 tablet (100 mg total) by mouth daily. Start taking on: July 13, 2020       Follow-up Information    [facility] Follow up on 07/15/2020.   Specialty: Cardiology Why: at 8:30am. Enter through the [facility] entrance Contact information: Victor Hernandez Victor [facility] Naknek, Sidell, FNP. Schedule an appointment as soon as possible for a visit.   Specialty: Family Medicine Contact information: Victor Hernandez Victor Hernandez [id number] [phone]              No Known Allergies  Consultations:  Cardiology  Procedures/Studies: DG Chest 2 View  Result Date: 07/05/2020 CLINICAL DATA:  Shortness of breath on exertion, complained of edema in lower extremities EXAM: CHEST - 2 VIEW COMPARISON:  Chest radiograph May 31, 2020 and chest CT May 31, 2020 FINDINGS: Enlarged cardiac silhouette. Small left pleural effusion. Bibasilar predominant interstitial and airspace opacities. The visualized skeletal structures are unremarkable. IMPRESSION: Cardiomegaly with small left pleural effusion and bibasilar predominant interstitial and airspace opacities. Findings may reflect CHF/pulmonary edema. Electronically Signed   By: Dahlia Bailiff MD   On: 07/05/2020 17:38    Subjective: Patient was seen and examined.  No new complaint.  He wants to go home.  Discharge Exam: Vitals:   07/12/20 0759 07/12/20 1125  BP: (!) 147/92 (!) 145/99  Pulse: (!) 109 100  Resp: 15 18  Temp: 98 F (36.7 C) 97.8 F (36.6 C)  SpO2: 99% 98%   Vitals:   07/11/20 2003 07/12/20 0438 07/12/20 0759 07/12/20 1125  BP: 130/81 136/82 (!) 147/92 (!) 145/99  Pulse: 66 99 (!) 109 100  Resp: $Remo'19 16 15 18  'hIQVc$ Temp: 98 F (36.7 C) 98.1 F (36.7 C) 98 F (36.7 C) 97.8 F (36.6 C)  TempSrc: Oral Oral Oral Oral  SpO2: 100% 98% 99% 98%  Weight:  123.8 kg    Height:        General: Pt is alert, awake, not in acute distress Cardiovascular: RRR, S1/S2 +, no rubs, no gallops Respiratory: CTA bilaterally, no wheezing, no rhonchi Abdominal: Soft, NT, ND, bowel sounds + Extremities: Trace LE, 2+ thigh edema.  The results of significant diagnostics from this hospitalization (including imaging, microbiology, ancillary  and laboratory) are listed below for reference.    Microbiology: Recent Results (from the past 240 hour(s))  SARS CORONAVIRUS 2 (TAT 6-24 HRS) Nasopharyngeal Nasopharyngeal Swab     Status: None   Collection Time: 07/05/20 11:21 PM   Specimen: Nasopharyngeal Swab  Result Value Ref Range Status   SARS Coronavirus 2 NEGATIVE NEGATIVE Final    Comment: (NOTE) SARS-CoV-2 target nucleic acids are NOT DETECTED.  The SARS-CoV-2 RNA is generally detectable in upper and lower respiratory specimens during the acute phase of infection. Negative results do not preclude SARS-CoV-2 infection, do not rule out co-infections with other pathogens, and should not be used as the sole basis for treatment or other patient management decisions. Negative results must be combined with clinical observations, patient history, and epidemiological information. The expected result is Negative.  Fact Sheet for Patients: SugarRoll.be  Fact Sheet for Healthcare Providers: https://www.woods-mathews.com/  This test is not yet approved or cleared by the [place] FDA and  has been authorized for detection and/or diagnosis of SARS-CoV-2 by FDA under an Emergency Use Authorization (EUA). This EUA will remain  in effect (meaning this test can be used) for the duration of the COVID-19 declaration under Se ction 564(b)(1) of the Act, 21 U.S.C. section 360bbb-3(b)(1), unless the authorization is terminated or revoked sooner.  Performed at [facility], [place] [address] [place],  [address]      Labs: BNP (last 3 results) Recent Labs    05/31/20 2030 07/05/20 1709  BNP 695.5* 330.0*   Basic Metabolic Panel: Recent Labs  Lab 07/06/20 0459 07/07/20 0516 07/08/20 0552 07/09/20 0547 07/10/20 0551 07/11/20 0525 07/12/20 0535  NA 141   < > 142 142 140 140 139  K 3.9   < > 3.7 3.6 3.5 3.5 3.5  CL 105   < > 103 101 100 98 96*  CO2 29   < > $R'29 30 31 '[place]$ 32  36*  GLUCOSE 377*   < > 173* 135* 125* 110* 137*  BUN 55*   < > 49* 48* 48* 50* 54*  CREATININE 2.85*   < > 2.68* 2.43* 2.37* 2.39* 2.52*  CALCIUM 8.3*   < > 8.5* 8.6* 8.6* 8.5* 8.6*  MG 1.9  --   --   --   --   --   --    < > = values in this interval not displayed.   Liver Function Tests: No results for input(s): AST, ALT, ALKPHOS, BILITOT, PROT, ALBUMIN in the last 168 hours. No results for input(s): LIPASE, AMYLASE in  the last 168 hours. No results for input(s): AMMONIA in the last 168 hours. CBC: Recent Labs  Lab 07/07/20 0516 07/08/20 0552 07/09/20 0547 07/10/20 0551 07/11/20 0525  WBC 6.4 6.5 6.0 6.2 6.5  HGB 10.9* 10.0* 10.1* 10.7* 10.6*  HCT 35.4* 32.1* 32.2* 33.0* 32.2*  MCV 93.4 92.2 91.5 90.2 90.2  PLT 262 253 264 260 270   Cardiac Enzymes: No results for input(s): CKTOTAL, CKMB, CKMBINDEX, TROPONINI in the last 168 hours. BNP: Invalid input(s): POCBNP CBG: Recent Labs  Lab 07/11/20 1154 07/11/20 1632 07/11/20 2001 07/12/20 0800 07/12/20 1127  GLUCAP 198* 208* 182* 132* 167*   D-Dimer No results for input(s): DDIMER in the last 72 hours. Hgb A1c No results for input(s): HGBA1C in the last 72 hours. Lipid Profile No results for input(s): CHOL, HDL, LDLCALC, TRIG, CHOLHDL, LDLDIRECT in the last 72 hours. Thyroid function studies No results for input(s): TSH, T4TOTAL, T3FREE, THYROIDAB in the last 72 hours.  Invalid input(s): FREET3 Anemia work up No results for input(s): VITAMINB12, FOLATE, FERRITIN, TIBC, IRON, RETICCTPCT in the last 72 hours. Urinalysis    Component Value Date/Time   COLORURINE YELLOW (A) 06/01/2020 1759   APPEARANCEUR CLEAR (A) 06/01/2020 1759   LABSPEC 1.010 06/01/2020 1759   PHURINE 5.0 06/01/2020 1759   GLUCOSEU 50 (A) 06/01/2020 1759   HGBUR MODERATE (A) 06/01/2020 1759   BILIRUBINUR NEGATIVE 06/01/2020 1759   KETONESUR NEGATIVE 06/01/2020 1759   PROTEINUR >=300 (A) 06/01/2020 1759   NITRITE NEGATIVE 06/01/2020 1759    LEUKOCYTESUR NEGATIVE 06/01/2020 1759   Sepsis Labs Invalid input(s): PROCALCITONIN,  WBC,  LACTICIDVEN Microbiology Recent Results (from the past 240 hour(s))  SARS CORONAVIRUS 2 (TAT 6-24 HRS) Nasopharyngeal Nasopharyngeal Swab     Status: None   Collection Time: 07/05/20 11:21 PM   Specimen: Nasopharyngeal Swab  Result Value Ref Range Status   SARS Coronavirus 2 NEGATIVE NEGATIVE Final    Comment: (NOTE) SARS-CoV-2 target nucleic acids are NOT DETECTED.  The SARS-CoV-2 RNA is generally detectable in upper and lower respiratory specimens during the acute phase of infection. Negative results do not preclude SARS-CoV-2 infection, do not rule out co-infections with other pathogens, and should not be used as the sole basis for treatment or other patient management decisions. Negative results must be combined with clinical observations, patient history, and epidemiological information. The expected result is Negative.  Fact Sheet for Patients: SugarRoll.be  Fact Sheet for Healthcare Providers: https://www.woods-mathews.com/  This test is not yet approved or cleared by the [place] FDA and  has been authorized for detection and/or diagnosis of SARS-CoV-2 by FDA under an Emergency Use Authorization (EUA). This EUA will remain  in effect (meaning this test can be used) for the duration of the COVID-19 declaration under Se ction 564(b)(1) of the Act, 21 U.S.C. section 360bbb-3(b)(1), unless the authorization is terminated or revoked sooner.  Performed at [facility], [place] [address], [place], [place] [address]     Time coordinating discharge: Over 30 minutes  SIGNED:  Lorella Nimrod, MD  [facility] 07/12/2020, 2:19 PM  If 7PM-7AM, please contact night-coverage [email or web address]  This record has been created using Systems analyst. Errors have been sought and corrected,but may not always be located. Such  creation errors do not reflect on the standard of care.

## 2020-07-12 NOTE — Progress Notes (Signed)
   Heart Failure Nurse Navigator Note  HFpEf 50-55%.  Mild LVH.  Normal right ventricular systolic function.  Moderate left atrial enlargement.  Moderate to severe mitral regurgitation.  Mild tricuspid regurgitation.  He presented to the emergency room with several weeks of shortness of breath, lower extremity edema extending to the scrotum, dyspnea on exertion, PND and orthopnea.  He was last hospitalized January 3-5 with hypertension and encephalopathy.  He was a no-show for his appointments with the heart failure clinic on January 12 of January 31.  Chest x-ray on admission revealed cardiomegaly with small pleural effusion on the left.  Bilateral interstitial and airway opacities.  Comorbidities:  Hyperlipidemia Hypertension Type 2 diabetes Morbid obesity Chronic kidney disease stage IV Anemia  Labs:  Sodium 139, potassium 3.5, chloride 96, CO2 36, BUN 54 up from 50 up yesterday and creatinine 2.52 up from 2.39 of yesterday.  Intake 720 mL Output 2600 mL Weight is 123.8 kg BMI is 44 Blood pressure 147/92   Assessment:  General-she is awake and alert sitting up in the chair at bedside in no acute distress.  HEENT-pupils are equal, sclera nonicteric.  Cardiac-heart tones of regular rate and rhythm with systolic murmur.  Chest-breath sounds are clear to posterior auscultation.  Abdomen-soft nontender  Musculoskeletal-lower extremities remain wood like.  Psych-is pleasant and appropriate.  Makes good eye contact.  Neurologic-speech is clear moves all extremities without difficulty.     Spent over 30 minutes with the patient today discussing relationship of sodium and fluid.  Patient states that he has been giving it a lot of thought how he is going to take care of himself once he goes home.  He is planning on going to the grocery store and buying healthy foods fresh and frozen vegetables, fruits.  Is also been planning his meals.  States he is going to learn to eat "  plain Opal Sidles."   Knows now that he is going to have to limit his fluid intake is different than what he has been told in the past.  Also discussed the medications that he is on, there classification and why he takes them.  Was also given a handout about his medications.  He also reviewed failure videos what is heart failure and sodium.  He would like to view more at another time.  Pricilla Riffle RN [facility]

## 2020-07-12 NOTE — Plan of Care (Signed)
DISCHARGE NOTE HOME Victor Hernandez to be discharged home per MD order. Discussed where to pick up prescriptions and follow up appointments with the patient.  Medication list explained in detail. Patient verbalized understanding.  Skin clean, dry and intact without evidence of skin break down, no evidence of skin tears noted. IV catheter discontinued intact. Site without signs and symptoms of complications. Dressing and pressure applied. Pt denies pain at the site currently. No complaints noted.  Patient free of lines, drains, and wounds.   An After Visit Summary (AVS) was printed and given to the patient. Patient escorted via wheelchair, and discharged home via private auto.  Stephan Minister, RN

## 2020-07-12 NOTE — Care Management Important Message (Signed)
Important Message  Patient Details  Name: Victor Hernandez MRN: [id number] Date of Birth: 1956/04/21   Medicare Important Message Given:  Yes     Dannette Barbara 07/12/2020, 1:38 PM

## 2020-07-15 ENCOUNTER — Ambulatory Visit: Payer: Medicare HMO | Admitting: Family

## 2020-07-15 ENCOUNTER — Telehealth: Payer: Self-pay | Admitting: Family

## 2020-07-15 ENCOUNTER — Telehealth: Payer: Self-pay

## 2020-07-15 NOTE — Telephone Encounter (Signed)
Patients phone is off and mailbox is full when I tried to call in attempt to reschedule patients no show appointment to [facility].   Alysson Geist, NT

## 2020-07-15 NOTE — Telephone Encounter (Signed)
Patient did not show for his Heart Failure Clinic appointment on 07/15/20. Will attempt to reschedule.

## 2020-07-17 NOTE — Progress Notes (Deleted)
   Patient ID: HANFORD LUST, male    DOB: May 17, 1956, 65 y.o.   MRN: [id number]  HPI  Mr Sacks is a 65 y/o male with a history of  Echo report from 06/01/20 reviewed and showed an EF of 50-55% along with moderate LVH/ LAE and moderate/ severe MR.   Admitted 07/05/20 due to shortness of breath and fluid overload. Initially given IV lasix with transition to oral diuretics with resultant loss of 12L. Cardiology consult obtained. Discharged after 7 days.   He presents today for his initial visit with a chief complaint of  Review of Systems    Physical Exam  Assessment & Plan:  1: Chronic heart failure with preserved ejection fraction with structural changes (LVH/ LAE)- - NYHA class - paramedicine referral has been made - BNP 07/05/20 was 866.4  2: HTN- - BP - BMP 07/12/20 reviewed and showed sodium 139, potassium 3.5, creatinine 2.52 and GFR 27  3: DM with CKD-

## 2020-07-19 ENCOUNTER — Ambulatory Visit: Payer: Medicare HMO | Admitting: Family

## 2020-07-21 ENCOUNTER — Telehealth (HOSPITAL_COMMUNITY): Payer: Self-pay

## 2020-07-21 NOTE — Telephone Encounter (Signed)
Attempted to contact to set up appt.  Left message.  [place] [phone]

## 2020-08-02 ENCOUNTER — Other Ambulatory Visit (HOSPITAL_COMMUNITY): Payer: Self-pay

## 2020-08-02 NOTE — Progress Notes (Signed)
Attempted to contact, no answer.  Left message.   Trying to schedule a home visit to see if wants to be part of [employer] program.  [place] [phone]

## 2020-08-09 ENCOUNTER — Telehealth (HOSPITAL_COMMUNITY): Payer: Self-pay

## 2020-08-09 NOTE — Telephone Encounter (Signed)
Was able to get a hold of Victor Hernandez.  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  He explained to call early in the day to catch him.  We have set up for me to call him on 3/22 at 8:00 to see if we can meet up and discuss the program.  He also stated he will go to the HF clinic appt on this Wednesday.  Will visit for heart failure.   [place] [phone]

## 2020-08-11 ENCOUNTER — Other Ambulatory Visit: Payer: Self-pay

## 2020-08-11 ENCOUNTER — Encounter: Payer: Self-pay | Admitting: Family

## 2020-08-11 ENCOUNTER — Ambulatory Visit: Payer: Medicare HMO | Attending: Family | Admitting: Family

## 2020-08-11 VITALS — BP 157/96 | HR 116 | Resp 18 | Ht 65.0 in | Wt 206.5 lb

## 2020-08-11 DIAGNOSIS — Z7982 Long term (current) use of aspirin: Secondary | ICD-10-CM | POA: Insufficient documentation

## 2020-08-11 DIAGNOSIS — R5383 Other fatigue: Secondary | ICD-10-CM | POA: Diagnosis present

## 2020-08-11 DIAGNOSIS — Z87891 Personal history of nicotine dependence: Secondary | ICD-10-CM | POA: Diagnosis not present

## 2020-08-11 DIAGNOSIS — N189 Chronic kidney disease, unspecified: Secondary | ICD-10-CM | POA: Diagnosis not present

## 2020-08-11 DIAGNOSIS — Z7901 Long term (current) use of anticoagulants: Secondary | ICD-10-CM | POA: Diagnosis not present

## 2020-08-11 DIAGNOSIS — R42 Dizziness and giddiness: Secondary | ICD-10-CM | POA: Diagnosis not present

## 2020-08-11 DIAGNOSIS — R000 Tachycardia, unspecified: Secondary | ICD-10-CM | POA: Diagnosis not present

## 2020-08-11 DIAGNOSIS — Z794 Long term (current) use of insulin: Secondary | ICD-10-CM

## 2020-08-11 DIAGNOSIS — R200 Anesthesia of skin: Secondary | ICD-10-CM | POA: Diagnosis not present

## 2020-08-11 DIAGNOSIS — I5032 Chronic diastolic (congestive) heart failure: Secondary | ICD-10-CM | POA: Diagnosis not present

## 2020-08-11 DIAGNOSIS — I130 Hypertensive heart and chronic kidney disease with heart failure and stage 1 through stage 4 chronic kidney disease, or unspecified chronic kidney disease: Secondary | ICD-10-CM | POA: Diagnosis not present

## 2020-08-11 DIAGNOSIS — I10 Essential (primary) hypertension: Secondary | ICD-10-CM

## 2020-08-11 DIAGNOSIS — Z79899 Other long term (current) drug therapy: Secondary | ICD-10-CM | POA: Diagnosis not present

## 2020-08-11 DIAGNOSIS — R002 Palpitations: Secondary | ICD-10-CM | POA: Insufficient documentation

## 2020-08-11 DIAGNOSIS — E1122 Type 2 diabetes mellitus with diabetic chronic kidney disease: Secondary | ICD-10-CM | POA: Insufficient documentation

## 2020-08-11 DIAGNOSIS — E785 Hyperlipidemia, unspecified: Secondary | ICD-10-CM | POA: Diagnosis not present

## 2020-08-11 DIAGNOSIS — N184 Chronic kidney disease, stage 4 (severe): Secondary | ICD-10-CM

## 2020-08-11 NOTE — Patient Instructions (Addendum)
Begin weighing daily and call for an overnight weight gain of > 2 pounds or a weekly weight gain of >5 pounds.   Bring ALL your medication bottles to the office tomorrow morning for review.

## 2020-08-11 NOTE — Progress Notes (Signed)
Patient ID: Victor Hernandez, male    DOB: 1955-11-14, 65 y.o.   MRN: [id number]  HPI  Victor Hernandez is a 65 y/o male with a history of DM, hyperlipidemia, HTN, CKD, previous tobacco use and chronic heart failure.   Echo report from 06/01/20 reviewed and showed an EF of 50-55% along with moderate LVH/ LAE and moderate/ severe Victor.   Admitted 07/05/20 due to shortness of breath and fluid overload. Initially given IV lasix with transition to oral diuretics with resultant loss of 12L. Cardiology consult obtained. Discharged after 7 days.   He presents today for his initial visit with a chief complaint of minimal fatigue upon moderate exertion. He says that this has been present for several months although he feels like it continues to improve. He has associated palpitations, light-headedness and intermittent numbness in his fingers. He denies any difficulty sleeping, abdominal distention, pedal edema, chest pain, shortness of breath or cough.   Has scales at home but hasn't been weighing daily. 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   Did not bring his medication bottles and can't tell me exactly what he's taking.  Past Medical History:  Diagnosis Date  . CHF (congestive heart failure) ([place])   . Chronic kidney disease   . Diabetes mellitus without complication ([place])   . Hyperlipidemia   . Hypertension    Past Surgical History:  Procedure Laterality Date  . COLONOSCOPY    . COLONOSCOPY WITH PROPOFOL N/A 05/19/2016   Procedure: COLONOSCOPY WITH PROPOFOL;  Surgeon: Christene Lye, MD;  Location: [facility] ENDOSCOPY;  Service: Endoscopy;  Laterality: N/A;   History reviewed. No pertinent family history. Social History   Tobacco Use  . Smoking status: Never Smoker  . Smokeless tobacco: Never Used  Substance Use Topics  . Alcohol use: No   No Known Allergies Prior to Admission medications   Medication Sig Start Date End Date Taking?  Authorizing Provider  amLODipine (NORVASC) 5 MG tablet Take 1 tablet (5 mg total) by mouth 2 (two) times daily. 07/12/20  Yes Lorella Nimrod, MD  aspirin EC 81 MG tablet Take 1 tablet (81 mg total) by mouth daily. 07/12/20  Yes Lorella Nimrod, MD  atorvastatin (LIPITOR) 40 MG tablet Take 40 mg by mouth at bedtime. 02/24/20  Yes [provider]  blood glucose meter kit and supplies Dispense based on patient and insurance preference. Use up to four times daily as directed. (FOR ICD-10 E10.9, E11.9). 06/02/20  Yes Jennye Boroughs, MD  KLOR-CON M20 20 MEQ tablet Take 1 tablet (20 mEq total) by mouth daily. Patient taking differently: Take 40 mEq by mouth daily. 06/02/20  Yes Jennye Boroughs, MD  losartan (COZAAR) 100 MG tablet Take 1 tablet (100 mg total) by mouth daily. 06/02/20  Yes Jennye Boroughs, MD  metoprolol tartrate (LOPRESSOR) 50 MG tablet Take 1 tablet (50 mg total) by mouth 2 (two) times daily. 06/02/20  Yes Jennye Boroughs, MD  mupirocin ointment (BACTROBAN) 2 % Apply 1 application topically 3 (three) times daily. 06/08/20  Yes [provider]  spironolactone (ALDACTONE) 25 MG tablet Take 25 mg by mouth daily.   Yes [provider]  torsemide (DEMADEX) 100 MG tablet Take 1 tablet (100 mg total) by mouth daily. 07/13/20  Yes Lorella Nimrod, MD   Review of Systems  Constitutional: Positive for fatigue (minimal). Negative for appetite change.  HENT: Positive for rhinorrhea. Negative for congestion and sore throat.  Eyes: Negative.   Respiratory: Negative for cough, chest tightness and shortness of breath.   Cardiovascular: Positive for palpitations. Negative for chest pain and leg swelling.  Gastrointestinal: Negative for abdominal distention and abdominal pain.  Endocrine: Negative.   Genitourinary: Negative.   Musculoskeletal: Negative for back pain and neck pain.  Skin: Negative.   Allergic/Immunologic: Negative.   Neurological: Positive for light-headedness (at times in the  mornings) and numbness (intermittent in fingers). Negative for dizziness.  Hematological: Negative for adenopathy. Does not bruise/bleed easily.  Psychiatric/Behavioral: Negative for dysphoric mood and sleep disturbance (sleeping on 1 pillow). The patient is not nervous/anxious.    Vitals:   08/11/20 0908  BP: (!) 157/96  Pulse: (!) 116  Resp: 18  SpO2: 100%  Weight: 206 lb 8 oz (93.7 kg)  Height: '5\' 5"'  (1.651 m)   Wt Readings from Last 3 Encounters:  08/11/20 206 lb 8 oz (93.7 kg)  07/12/20 273 lb 0.3 oz (123.8 kg)  06/02/20 253 lb (114.8 kg)   Lab Results  Component Value Date   CREATININE 2.52 (H) 07/12/2020   CREATININE 2.39 (H) 07/11/2020   CREATININE 2.37 (H) 07/10/2020    Physical Exam Vitals and nursing note reviewed.  Constitutional:      Appearance: Normal appearance.  HENT:     Head: Normocephalic and atraumatic.  Cardiovascular:     Rate and Rhythm: Regular rhythm. Tachycardia present.  Pulmonary:     Effort: Pulmonary effort is normal. No respiratory distress.     Breath sounds: No wheezing or rales.  Abdominal:     General: There is no distension.     Palpations: Abdomen is soft.     Tenderness: There is no abdominal tenderness.  Musculoskeletal:        General: No tenderness.     Cervical back: Normal range of motion and neck supple.     Right lower leg: No edema.     Left lower leg: No edema.  Skin:    General: Skin is warm and dry.  Neurological:     General: No focal deficit present.     Mental Status: He is alert and oriented to person, place, and time.  Psychiatric:        Mood and Affect: Mood normal.        Behavior: Behavior normal.        Thought Content: Thought content normal.    Assessment & Plan:  1: Chronic heart failure with preserved ejection fraction with structural changes (LVH/ LAE)- - NYHA class II - euvolemic today - not weighing but he does have scales at home; instructed to weigh every morning, write the weight down  and call for an overnight weight gain of > 2 pounds or a weekly weight gain of > 5 pounds - not adding salt and is trying to be mindful of sodium content of foods; written dietary information given to him to follow a 2056m sodium diet - saw cardiology (Mickle Plumb during recent admission - had his pharmacy fax me a med list which doesn't match our list; we have losartan listed and pharmacy has entresto instead; we have metoprolol listed and pharmacy notes metoprolol on hold; patient does tell me that he has a bottle at home that he has written "hold" on it but he can't recall what it is - patient will bring all medication bottles back to the office first thing tomorrow morning for review - tachycardic today so needs some form of beta blocker - paramedicine  referral has been made  - BNP 07/05/20 was 866.4  2: HTN- - BP mildly elevated today but, again, not sure of exactly what he's taking - sees PCP Lavena Bullion) next week - BMP 07/12/20 reviewed and showed sodium 139, potassium 3.5, creatinine 2.52 and GFR 27  3: DM with CKD- - glucose at home today was 170 - A1c 06/01/20 was 7.2%   Patient did not bring his medication bottles today but will bring them back tomorrow morning. Emphasized how important it was to bring all bottles to every provider's appointment every time. Explained that I didn't want to add or adjust a medication without knowing exactly what he's taking and patient verbalized understanding.   Return tomorrow morning for med review and then an appointment on 09/07/20 or sooner if needed.

## 2020-08-12 ENCOUNTER — Other Ambulatory Visit: Payer: Self-pay | Admitting: Family

## 2020-08-12 MED ORDER — METOPROLOL TARTRATE 50 MG PO TABS: 50.0000 mg | ORAL_TABLET | Freq: Two times a day (BID) | ORAL | 5 refills | Status: DC

## 2020-08-12 MED ORDER — SACUBITRIL-VALSARTAN 97-103 MG PO TABS: 1.0000 | ORAL_TABLET | Freq: Two times a day (BID) | ORAL | 5 refills | Status: DC

## 2020-08-12 NOTE — Progress Notes (Signed)
Patient brought medications for review. Losartan, potassium were removed and entresto 97/103mg  added.   No metoprolol bottle so will resume at 50mg  BID

## 2020-08-17 ENCOUNTER — Telehealth (HOSPITAL_COMMUNITY): Payer: Self-pay

## 2020-08-17 NOTE — Telephone Encounter (Signed)
Attempted to contact Victor Hernandez today, left message.  Will try again during the week.   [place] [phone]

## 2020-08-18 ENCOUNTER — Telehealth (HOSPITAL_COMMUNITY): Payer: Self-pay

## 2020-08-18 NOTE — Telephone Encounter (Signed)
Was able to talk with Victor Hernandez today, since it is raining I thought he may be able to have a visit in the morning.  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  He states just call him when I'm in the area and I can visit.  Will keep attempting to try to meet with him to visit for heart failure.   [place] [phone]

## 2020-09-06 NOTE — Progress Notes (Signed)
Patient ID: Victor Hernandez, male    DOB: [birth year], 65 y.o.   MRN: [id number]   Mr Lederman is a 65 y/o male with a history of DM, hyperlipidemia, HTN, CKD, previous tobacco use and chronic heart failure.   Echo report from 06/01/20 reviewed and showed an EF of 50-55% along with moderate LVH/ LAE and moderate/ severe MR.   Admitted 07/05/20 due to shortness of breath and fluid overload. Initially given IV lasix with transition to oral diuretics with resultant loss of 12L. Cardiology consult obtained. Discharged after 7 days.   He presents today for a follow-up visit with a chief complaint of minimal shortness of breath upon moderate exertion. He describes this as having been present for several months. He has associated fatigue, palpitations and light-headedness along with this. He denies any difficulty sleeping, abdominal distention, pedal edema, chest pain, cough or weight gain.   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   He has not taken any of his medications yet today because he hasn't eaten breakfast yet.   Past Medical History:  Diagnosis Date  . CHF (congestive heart failure) ([place])   . Chronic kidney disease   . Diabetes mellitus without complication ([place])   . Hyperlipidemia   . Hypertension    Past Surgical History:  Procedure Laterality Date  . COLONOSCOPY    . COLONOSCOPY WITH PROPOFOL N/A 05/19/2016   Procedure: COLONOSCOPY WITH PROPOFOL;  Surgeon: Christene Lye, MD;  Location: [facility] ENDOSCOPY;  Service: Endoscopy;  Laterality: N/A;   No family history on file. Social History   Tobacco Use  . Smoking status: Never Smoker  . Smokeless tobacco: Never Used  Substance Use Topics  . Alcohol use: No   No Known Allergies  Prior to Admission medications   Medication Sig Start Date End Date Taking? Authorizing Provider  amLODipine (NORVASC) 5 MG tablet Take 1 tablet (5 mg total) by mouth 2 (two) times daily.  07/12/20  Yes Lorella Nimrod, MD  aspirin EC 81 MG tablet Take 1 tablet (81 mg total) by mouth daily. 07/12/20  Yes Lorella Nimrod, MD  atorvastatin (LIPITOR) 40 MG tablet Take 40 mg by mouth at bedtime. 02/24/20  Yes [provider]  blood glucose meter kit and supplies Dispense based on patient and insurance preference. Use up to four times daily as directed. (FOR ICD-10 E10.9, E11.9). 06/02/20  Yes Jennye Boroughs, MD  insulin aspart (NOVOLOG) 100 UNIT/ML injection Inject into the skin. Sliding scale   Yes [provider]  metoprolol tartrate (LOPRESSOR) 50 MG tablet Take 1 tablet (50 mg total) by mouth 2 (two) times daily. 08/12/20  Yes Coley Littles, Otila Kluver A, FNP  mupirocin ointment (BACTROBAN) 2 % Apply 1 application topically 3 (three) times daily. 06/08/20  Yes [provider]  sacubitril-valsartan (ENTRESTO) 97-103 MG Take 1 tablet by mouth 2 (two) times daily. 08/12/20  Yes Tashaun Obey, Otila Kluver A, FNP  spironolactone (ALDACTONE) 25 MG tablet Take 25 mg by mouth daily.   Yes [provider]  torsemide (DEMADEX) 100 MG tablet Take 1 tablet (100 mg total) by mouth daily. 07/13/20  Yes Lorella Nimrod, MD    Review of Systems  Constitutional: Positive for fatigue (minimal). Negative for appetite change.  HENT: Positive for rhinorrhea. Negative for congestion and sore throat.   Eyes: Negative.   Respiratory: Positive for shortness of breath (with moderate exertion). Negative for cough and chest tightness.   Cardiovascular: Positive for  palpitations. Negative for chest pain and leg swelling.  Gastrointestinal: Negative for abdominal distention and abdominal pain.  Endocrine: Negative.   Genitourinary: Negative.   Musculoskeletal: Negative for back pain and neck pain.  Skin: Negative.   Allergic/Immunologic: Negative.   Neurological: Positive for light-headedness (at times) and numbness (intermittent in fingers). Negative for dizziness.  Hematological: Negative for adenopathy.  Does not bruise/bleed easily.  Psychiatric/Behavioral: Negative for dysphoric mood and sleep disturbance (sleeping on 1 pillow). The patient is not nervous/anxious.    Vitals:   09/07/20 0855  BP: (!) 155/95  Pulse: (!) 113  Resp: 20  SpO2: 100%  Weight: 212 lb 2 oz (96.2 kg)  Height: '5\' 10"'  (1.778 m)   Wt Readings from Last 3 Encounters:  09/07/20 212 lb 2 oz (96.2 kg)  08/11/20 206 lb 8 oz (93.7 kg)  07/12/20 273 lb 0.3 oz (123.8 kg)   Lab Results  Component Value Date   CREATININE 2.52 (H) 07/12/2020   CREATININE 2.39 (H) 07/11/2020   CREATININE 2.37 (H) 07/10/2020    Physical Exam Vitals and nursing note reviewed.  Constitutional:      Appearance: Normal appearance.  HENT:     Head: Normocephalic and atraumatic.  Cardiovascular:     Rate and Rhythm: Regular rhythm. Tachycardia present.  Pulmonary:     Effort: Pulmonary effort is normal. No respiratory distress.     Breath sounds: No wheezing or rales.  Abdominal:     General: There is no distension.     Palpations: Abdomen is soft.     Tenderness: There is no abdominal tenderness.  Musculoskeletal:        General: No tenderness.     Cervical back: Normal range of motion and neck supple.     Right lower leg: No edema.     Left lower leg: No edema.  Skin:    General: Skin is warm and dry.  Neurological:     General: No focal deficit present.     Mental Status: He is alert and oriented to person, place, and time.  Psychiatric:        Mood and Affect: Mood normal.        Behavior: Behavior normal.        Thought Content: Thought content normal.    Assessment & Plan:  1: Chronic heart failure with preserved ejection fraction with structural changes (LVH/ LAE)- - NYHA class II - euvolemic today - weighing daily and says that his home weight without clothes on is ~ 200 pounds;reminded to call for an overnight weight gain of > 2 pounds or a weekly weight gain of > 5 pounds - weight up 6 pounds from last visit  here 1 month ago - not adding salt and is trying to be mindful of sodium content of foods - saw cardiology Mickle Plumb) during recent admission - paramedicine referral made at last visit but she's had difficulty in getting up with him; today patient says that he's still interested but that he gets very busy during the day; he says that it's ok for paramedic to continue to reach out; will let paramedic know this information - patient tachycardic today but he hasn't taken any of his medications yet; strongly encouraged him to take them prior to coming so efficacy can be assessed - BNP 07/05/20 was 866.4  2: HTN- - BP elevated but, again, he hasn't taken any of his medications yet today - saw PCP Lavena Bullion) yesterday & returns ~ 3 months -  BMP 07/12/20 reviewed and showed sodium 139, potassium 3.5, creatinine 2.52 and GFR 27  3: DM with CKD- - glucose at home today was 200 - A1c 06/01/20 was 7.2%   Medication bottles reviewed.   Return in 1 month or sooner for any questions/problems before then.

## 2020-09-07 ENCOUNTER — Ambulatory Visit: Payer: Medicare HMO | Attending: Family | Admitting: Family

## 2020-09-07 ENCOUNTER — Other Ambulatory Visit: Payer: Self-pay

## 2020-09-07 ENCOUNTER — Encounter: Payer: Self-pay | Admitting: Family

## 2020-09-07 VITALS — BP 155/95 | HR 113 | Resp 20 | Ht 70.0 in | Wt 212.1 lb

## 2020-09-07 DIAGNOSIS — Z87891 Personal history of nicotine dependence: Secondary | ICD-10-CM | POA: Diagnosis not present

## 2020-09-07 DIAGNOSIS — I130 Hypertensive heart and chronic kidney disease with heart failure and stage 1 through stage 4 chronic kidney disease, or unspecified chronic kidney disease: Secondary | ICD-10-CM | POA: Diagnosis not present

## 2020-09-07 DIAGNOSIS — Z7982 Long term (current) use of aspirin: Secondary | ICD-10-CM | POA: Insufficient documentation

## 2020-09-07 DIAGNOSIS — I5032 Chronic diastolic (congestive) heart failure: Secondary | ICD-10-CM

## 2020-09-07 DIAGNOSIS — N189 Chronic kidney disease, unspecified: Secondary | ICD-10-CM | POA: Insufficient documentation

## 2020-09-07 DIAGNOSIS — Z794 Long term (current) use of insulin: Secondary | ICD-10-CM | POA: Insufficient documentation

## 2020-09-07 DIAGNOSIS — E785 Hyperlipidemia, unspecified: Secondary | ICD-10-CM | POA: Diagnosis not present

## 2020-09-07 DIAGNOSIS — I10 Essential (primary) hypertension: Secondary | ICD-10-CM

## 2020-09-07 DIAGNOSIS — N184 Chronic kidney disease, stage 4 (severe): Secondary | ICD-10-CM

## 2020-09-07 DIAGNOSIS — I509 Heart failure, unspecified: Secondary | ICD-10-CM | POA: Diagnosis present

## 2020-09-07 DIAGNOSIS — E1122 Type 2 diabetes mellitus with diabetic chronic kidney disease: Secondary | ICD-10-CM | POA: Insufficient documentation

## 2020-09-07 NOTE — Patient Instructions (Signed)
Continue weighing daily and call for an overnight weight gain of > 2 pounds or a weekly weight gain of >5 pounds. 

## 2020-09-16 ENCOUNTER — Telehealth (HOSPITAL_COMMUNITY): Payer: Self-pay

## 2020-09-16 NOTE — Telephone Encounter (Signed)
Attempted to contact to set up appt, no answer and unable to leave message.  [place] [phone]

## 2020-10-08 ENCOUNTER — Encounter: Payer: Self-pay | Admitting: Family

## 2020-10-08 ENCOUNTER — Ambulatory Visit: Payer: Medicare HMO | Attending: Family | Admitting: Family

## 2020-10-08 ENCOUNTER — Telehealth: Payer: Self-pay | Admitting: Family

## 2020-10-08 ENCOUNTER — Other Ambulatory Visit: Payer: Self-pay

## 2020-10-08 VITALS — BP 166/82 | HR 63 | Resp 20 | Ht 67.0 in | Wt 200.0 lb

## 2020-10-08 DIAGNOSIS — Z794 Long term (current) use of insulin: Secondary | ICD-10-CM | POA: Diagnosis not present

## 2020-10-08 DIAGNOSIS — I5032 Chronic diastolic (congestive) heart failure: Secondary | ICD-10-CM | POA: Diagnosis not present

## 2020-10-08 DIAGNOSIS — Z87891 Personal history of nicotine dependence: Secondary | ICD-10-CM | POA: Insufficient documentation

## 2020-10-08 DIAGNOSIS — Z7982 Long term (current) use of aspirin: Secondary | ICD-10-CM | POA: Insufficient documentation

## 2020-10-08 DIAGNOSIS — E1122 Type 2 diabetes mellitus with diabetic chronic kidney disease: Secondary | ICD-10-CM

## 2020-10-08 DIAGNOSIS — Z79899 Other long term (current) drug therapy: Secondary | ICD-10-CM | POA: Insufficient documentation

## 2020-10-08 DIAGNOSIS — E785 Hyperlipidemia, unspecified: Secondary | ICD-10-CM | POA: Insufficient documentation

## 2020-10-08 DIAGNOSIS — N189 Chronic kidney disease, unspecified: Secondary | ICD-10-CM | POA: Insufficient documentation

## 2020-10-08 DIAGNOSIS — I130 Hypertensive heart and chronic kidney disease with heart failure and stage 1 through stage 4 chronic kidney disease, or unspecified chronic kidney disease: Secondary | ICD-10-CM | POA: Diagnosis present

## 2020-10-08 DIAGNOSIS — I10 Essential (primary) hypertension: Secondary | ICD-10-CM

## 2020-10-08 DIAGNOSIS — N184 Chronic kidney disease, stage 4 (severe): Secondary | ICD-10-CM

## 2020-10-08 NOTE — Patient Instructions (Addendum)
Continue weighing daily and call for an overnight weight gain of > 2 pounds or a weekly weight gain of >5 pounds.   Take your medications before coming to your appointments.

## 2020-10-08 NOTE — Progress Notes (Signed)
Patient ID: Victor Hernandez, male    DOB: 02-Jun-1955, 65 y.o.   MRN: [id number]   Victor Hernandez is a 65 y/o male with a history of DM, hyperlipidemia, HTN, CKD, previous tobacco use and chronic heart failure.   Echo report from 06/01/20 reviewed and showed an EF of 50-55% along with moderate LVH/ LAE and moderate/ severe Victor.   Admitted 07/05/20 due to shortness of breath and fluid overload. Initially given IV lasix with transition to oral diuretics with resultant loss of 12L. Cardiology consult obtained. Discharged after 7 days.   He presents today for a follow-up visit with a chief complaint of minimal shortness of breath upon moderate fatigue. He describes this as chronic in nature having been present for many months. He has associated fatigue, intermittent palpitations, light- headedness and occasional difficulty sleeping along with this. He denies any abdominal distention, pedal edema, chest pain, cough or weight gain.   Continues to weigh daily and says that he's gradually lost some weight because he's been trying to be mindful of watching what he eats. He did not take his medications prior to coming today because he says that he hasn't eaten breakfast yet.   He says that he takes insulin once daily but can't remember what kind it is. Most likely it is lantus and not humalog.   Past Medical History:  Diagnosis Date  . CHF (congestive heart failure) ([place])   . Chronic kidney disease   . Diabetes mellitus without complication ([place])   . Hyperlipidemia   . Hypertension    Past Surgical History:  Procedure Laterality Date  . COLONOSCOPY    . COLONOSCOPY WITH PROPOFOL N/A 05/19/2016   Procedure: COLONOSCOPY WITH PROPOFOL;  Surgeon: Christene Lye, MD;  Location: [facility] ENDOSCOPY;  Service: Endoscopy;  Laterality: N/A;   No family history on file. Social History   Tobacco Use  . Smoking status: Never Smoker  . Smokeless tobacco: Never Used  Substance Use Topics  . Alcohol use: No   No  Known Allergies  Prior to Admission medications   Medication Sig Start Date End Date Taking? Authorizing Provider  acetaminophen (TYLENOL) 325 MG tablet Take 650 mg by mouth every 6 (six) hours as needed.   Yes [provider]  amLODipine (NORVASC) 5 MG tablet Take 1 tablet (5 mg total) by mouth 2 (two) times daily. 07/12/20  Yes Lorella Nimrod, MD  aspirin EC 81 MG tablet Take 1 tablet (81 mg total) by mouth daily. 07/12/20  Yes Lorella Nimrod, MD  atorvastatin (LIPITOR) 40 MG tablet Take 40 mg by mouth at bedtime. 02/24/20  Yes [provider]  blood glucose meter kit and supplies Dispense based on patient and insurance preference. Use up to four times daily as directed. (FOR ICD-10 E10.9, E11.9). 06/02/20  Yes Jennye Boroughs, MD  Ferrous Sulfate (SLOW FE) 142 (45 Fe) MG TBCR Take 1 tablet by mouth daily.   Yes [provider]  metoprolol tartrate (LOPRESSOR) 50 MG tablet Take 1 tablet (50 mg total) by mouth 2 (two) times daily. 08/12/20  Yes Deshayla Empson A, FNP  sacubitril-valsartan (ENTRESTO) 97-103 MG Take 1 tablet by mouth 2 (two) times daily. 08/12/20  Yes Azavion Bouillon, Otila Kluver A, FNP  spironolactone (ALDACTONE) 25 MG tablet Take 25 mg by mouth daily.   Yes [provider]  torsemide (DEMADEX) 100 MG tablet Take 1 tablet (100 mg total) by mouth daily. Patient taking differently: Take 50 mg by mouth daily. 07/13/20  Yes Amin,  Sumayya, MD  insulin aspart (NOVOLOG) 100 UNIT/ML injection Inject into the skin. Sliding scale    [provider]    Review of Systems  Constitutional: Positive for fatigue (minimal). Negative for appetite change.  HENT: Positive for rhinorrhea. Negative for congestion and sore throat.   Eyes: Negative.   Respiratory: Positive for shortness of breath (with moderate exertion). Negative for cough and chest tightness.   Cardiovascular: Positive for palpitations. Negative for chest pain and leg swelling.  Gastrointestinal: Negative for  abdominal distention and abdominal pain.  Endocrine: Negative.   Genitourinary: Negative.   Musculoskeletal: Negative for back pain and neck pain.  Skin: Negative.   Allergic/Immunologic: Negative.   Neurological: Positive for light-headedness (at times). Negative for dizziness and numbness.  Hematological: Negative for adenopathy. Does not bruise/bleed easily.  Psychiatric/Behavioral: Positive for sleep disturbance (sleeping on 1 pillow; sometimes trouble sleeping). Negative for dysphoric mood. The patient is not nervous/anxious.    Vitals:   10/08/20 0846  BP: (!) 166/82  Pulse: 63  Resp: 20  SpO2: 100%  Weight: 200 lb (90.7 kg)  Height: _0  (1.702 m)   Wt Readings from Last 3 Encounters:  10/08/20 200 lb (90.7 kg)  09/07/20 212 lb 2 oz (96.2 kg)  08/11/20 206 lb 8 oz (93.7 kg)   Lab Results  Component Value Date   CREATININE 2.52 (H) 07/12/2020   CREATININE 2.39 (H) 07/11/2020   CREATININE 2.37 (H) 07/10/2020    Physical Exam Vitals and nursing note reviewed.  Constitutional:      Appearance: Normal appearance.  HENT:     Head: Normocephalic and atraumatic.  Cardiovascular:     Rate and Rhythm: Normal rate and regular rhythm.  Pulmonary:     Effort: Pulmonary effort is normal. No respiratory distress.     Breath sounds: No wheezing or rales.  Abdominal:     General: There is no distension.     Palpations: Abdomen is soft.     Tenderness: There is no abdominal tenderness.  Musculoskeletal:        General: No tenderness.     Cervical back: Normal range of motion and neck supple.     Right lower leg: No edema.     Left lower leg: No edema.  Skin:    General: Skin is warm and dry.  Neurological:     General: No focal deficit present.     Mental Status: He is alert and oriented to person, place, and time.  Psychiatric:        Mood and Affect: Mood normal.        Behavior: Behavior normal.        Thought Content: Thought content normal.    Assessment &  Plan:  1: Chronic heart failure with preserved ejection fraction with structural changes (LVH/ LAE)- - NYHA class II - euvolemic today - weighing daily and says that his home weight without clothes on is ~ 200 pounds;reminded to call for an overnight weight gain of > 2 pounds or a weekly weight gain of > 5 pounds - weight down 12 pounds from last visit here 1 month ago - not adding salt and is trying to be mindful of sodium content of foods - saw cardiology Victor Hernandez) during recent admission - on GDMT of entresto; consider adding jardiance if recent lab results are stable; records requested from PCP office and I will call him if we can start jardiance as we will need to watch renal function carefully -  BNP 07/05/20 was 866.4 - PharmD reconciled medications with the patient  2: HTN- - BP elevated but he hasn't taken any of his medications yet today. Emphasized that he needed to take his medications prior to coming to the office to determine effectiveness of medications.  - saw PCP Victor Hernandez) yesterday & returns ~ 3 months - BMP 07/12/20 reviewed and showed sodium 139, potassium 3.5, creatinine 2.52 and GFR 27  3: DM with CKD- - glucose at home today was 240 - A1c 06/01/20 was 7.2%   Medication bottles reviewed.   Return in 2 months or sooner for any questions/problems before then.

## 2020-10-08 NOTE — Telephone Encounter (Signed)
Received lab results from PCP's office dated 09/02/20:  Sodium 141 Potassium 4.6 BUN 62 Creatinine 3.2 GFR 21  A1c 8.9%  Due to worsening renal function, will not start jardiance.

## 2020-10-08 NOTE — Progress Notes (Signed)
[facility] - PHARMACIST COUNSELING NOTE  ADHERENCE ASSESSMENT    Do you ever forget to take your medication? '[x]' Yes (1) '[]' No (0)  Do you ever skip doses due to side effects? '[]' Yes (1) '[x]' No (0)  Do you have trouble affording your medicines? '[]' Yes (1) '[x]' No (0)  Are you ever unable to pick up your medication due to transportation difficulties? '[]' Yes (1) '[x]' No (0)  Do you ever stop taking your medications because you don't believe they are helping? '[]' Yes (1) '[x]' No (0)  Total score _1______    Recommendations given to patient about increasing adherence: Pt did not take medications prior to appointment. Pt sometimes forgets to take medications at the correct time and will take later when he remembers. Recommend using alarms or pill box to help remember medications.  Guideline-Directed Medical Therapy/Evidence Based Medicine  ACE/ARB/ARNI: [facility] Corporal Blocker: metoprolol tartrate Aldosterone Antagonist: spironolactone Diuretic: torsemide    SUBJECTIVE  HPI:  Past Medical History:  Diagnosis Date  . CHF (congestive heart failure) ([place])   . Chronic kidney disease   . Diabetes mellitus without complication ([place])   . Hyperlipidemia   . Hypertension         OBJECTIVE   Vital signs: HR 63, BP 166/82, weight (pounds) 200 ECHO: Date 06/01/20, EF 50-55%, notes moderate LVH, LA moderately dilated  BMP Latest Ref Rng & Units 07/12/2020 07/11/2020 07/10/2020  Glucose 70 - 99 mg/dL 137(H) 110(H) 125(H)  BUN 8 - 23 mg/dL 54(H) 50(H) 48(H)  Creatinine 0.61 - 1.24 mg/dL 2.52(H) 2.39(H) 2.37(H)  Sodium 135 - 145 mmol/L 139 140 140  Potassium 3.5 - 5.1 mmol/L 3.5 3.5 3.5  Chloride 98 - 111 mmol/L 96(L) 98 100  CO2 22 - 32 mmol/L 36(H) 32 31  Calcium 8.9 - 10.3 mg/dL 8.6(L) 8.5(L) 8.6(L)    ASSESSMENT 65 yo M presenting to heart failure clinic for follow-up visit. Pt monitors weight daily at home and blood pressure occasionally. PMH  includes CHF, HTN, HLD, diabetes, and CKD. Pt did not take medications prior to appointment, BP is elevated at 166/82. Pt is unsure which insulin he is taking; does not take with meals, takes 30 units at bedtime (could be Lantus but outpatient records are not available through chart). No additional barriers to adherence were identified during medication reconciliation. Reviewed each medication and indication during medication reconciliation.    PLAN CHF (EF 50-55%)/HTN - Continue amlodipine 5 mg twice daily  - Continue metoprolol tartrate 50 mg twice daily  - Continue Entresto 97-103 mg twice daily - Continue spironolactone 25 mg daily  - Continue torsemide 50 mg daily - Consider addition of empagliflozin 10 mg daily  - Checking with PCP for recent labs to ensure renal function has  remained stable with eGFR >/= 20 mL/min   CAD/HLD - Continue aspirin 81 mg daily  - Continue atorvastatin 40 mg daily   Pain - Continue acetaminophen use as needed  Anemia - Continue Slow Fe 142 mg daily   Diabetes -  Continue insulin daily at bedtime; Hgb A1c 7.2% on 06/01/20  - Pt is unsure which insulin he is taking; does not take with meals, takes  30 units at bedtime (could be Lantus or Levemir but outpatient records  are not available through chart).  Time spent: 15 minutes  Benn Moulder, PharmD Pharmacy Resident  10/08/2020 8:43 AM   Current Outpatient Medications:  .  amLODipine (NORVASC) 5 MG tablet, Take 1 tablet (5 mg  total) by mouth 2 (two) times daily., Disp: 60 tablet, Rfl: 1 .  aspirin EC 81 MG tablet, Take 1 tablet (81 mg total) by mouth daily., Disp: 30 tablet, Rfl: 11 .  atorvastatin (LIPITOR) 40 MG tablet, Take 40 mg by mouth at bedtime., Disp: , Rfl:  .  blood glucose meter kit and supplies, Dispense based on patient and insurance preference. Use up to four times daily as directed. (FOR ICD-10 E10.9, E11.9)., Disp: 1 each, Rfl: 0 .  insulin aspart (NOVOLOG) 100 UNIT/ML  injection, Inject into the skin. Sliding scale, Disp: , Rfl:  .  metoprolol tartrate (LOPRESSOR) 50 MG tablet, Take 1 tablet (50 mg total) by mouth 2 (two) times daily., Disp: 60 tablet, Rfl: 5 .  mupirocin ointment (BACTROBAN) 2 %, Apply 1 application topically 3 (three) times daily., Disp: , Rfl:  .  sacubitril-valsartan (ENTRESTO) 97-103 MG, Take 1 tablet by mouth 2 (two) times daily., Disp: 60 tablet, Rfl: 5 .  spironolactone (ALDACTONE) 25 MG tablet, Take 25 mg by mouth daily., Disp: , Rfl:  .  torsemide (DEMADEX) 100 MG tablet, Take 1 tablet (100 mg total) by mouth daily., Disp: 30 tablet, Rfl: 1   COUNSELING POINTS/CLINICAL PEARLS Metoprolol Tartrate Warn patient to avoid activities requiring mental alertness or coordination until drug effects are realized, as drug may cause dizziness. Tell patient planning major surgery with anesthesia to alert physician that drug is being used, as drug impairs ability of heart to respond to reflex adrenergic stimuli. Drug may cause diarrhea, fatigue, headache, or depression. Advise diabetic patient to carefully monitor blood glucose as drug may mask symptoms of hypoglycemia. Counsel patient against sudden discontinuation of drug, as this may precipitate hypertension, angina, or myocardial infarction. In the event of a missed dose, counsel patient to skip the missed dose and maintain a regular dosing schedule. Entresto (Goal: 97/103 mg twice daily)  Warn male patient to avoid pregnancy during therapy and to report a pregnancy to a physician.  Advise patient to report symptomatic hypotension.  Side effects may include hyperkalemia, cough, dizziness, or renal failure. Torsemide  Side effects may include excessive urination.  Tell patient to report symptoms of ototoxicity.  Instruct patient to report lightheadedness or syncope.  Warn patient to avoid use of nonprescription NSAID products without first discussing it with their healthcare  provider. Spironolactone  Warn patient to report dehydration, hypotension, or symptoms of worsening renal function.  Counsel male patient to report gynecomastia.  Side effects may include diarrhea, nausea, vomiting, abdominal cramping, fever, leg cramps, lethargy, mental confusion, decreased libido, irregular menses, and rash. Suspension: Tell patient to take drug consistently with respect to food, either before or after a meal.  Advise patient to avoid potassium supplements and foods containing high levels of potassium, including salt substitutes.  DRUGS TO AVOID IN HEART FAILURE  Drug or Class Mechanism  Analgesics . NSAIDs . COX-2 inhibitors . Glucocorticoids  Sodium and water retention, increased systemic vascular resistance, decreased response to diuretics   Diabetes Medications . Metformin . Thiazolidinediones o Rosiglitazone (Avandia) o Pioglitazone (Actos) . DPP4 Inhibitors o Saxagliptin (Onglyza) o Sitagliptin (Januvia)   Lactic acidosis Possible calcium channel blockade   Unknown  Antiarrhythmics . Class I  o Flecainide o Disopyramide . Class III o Sotalol . Other o Dronedarone  Negative inotrope, proarrhythmic   Proarrhythmic, beta blockade  Negative inotrope  Antihypertensives . Alpha Blockers o Doxazosin . Calcium Channel Blockers o Diltiazem o Verapamil o Nifedipine . Central Alpha Adrenergics o Moxonidine .  Peripheral Vasodilators o Minoxidil  Increases renin and aldosterone  Negative inotrope    Possible sympathetic withdrawal  Unknown  Anti-infective . Itraconazole . Amphotericin B  Negative inotrope Unknown  Hematologic . Anagrelide . Cilostazol   Possible inhibition of PD IV Inhibition of PD III causing arrhythmias  Neurologic/Psychiatric . Stimulants . Anti-Seizure  Drugs o Carbamazepine o Pregabalin . Antidepressants o Tricyclics o Citalopram . Parkinsons o Bromocriptine o Pergolide o Pramipexole . Antipsychotics o Clozapine . Antimigraine o Ergotamine o Methysergide . Appetite suppressants . Bipolar o Lithium  Peripheral alpha and beta agonist activity  Negative inotrope and chronotrope Calcium channel blockade  Negative inotrope, proarrhythmic Dose-dependent QT prolongation  Excessive serotonin activity/valvular damage Excessive serotonin activity/valvular damage Unknown  IgE mediated hypersensitivy, calcium channel blockade  Excessive serotonin activity/valvular damage Excessive serotonin activity/valvular damage Valvular damage  Direct myofibrillar degeneration, adrenergic stimulation  Antimalarials . Chloroquine . Hydroxychloroquine Intracellular inhibition of lysosomal enzymes  Urologic Agents . Alpha Blockers o Doxazosin o Prazosin o Tamsulosin o Terazosin  Increased renin and aldosterone  Adapted from Page RL, et al. "Drugs That May Cause or Exacerbate Heart Failure: A Scientific Statement from the [place]." Circulation 2016; 021:R17-B56. DOI: 10.1161/CIR.0000000000000426   MEDICATION ADHERENCES TIPS AND STRATEGIES 1. Taking medication as prescribed improves patient outcomes in heart failure (reduces hospitalizations, improves symptoms, increases survival) 2. Side effects of medications can be managed by decreasing doses, switching agents, stopping drugs, or adding additional therapy. Please let someone in the [facility] know if you have having bothersome side effects so we can modify your regimen. Do not alter your medication regimen without talking to [place].  3. Medication reminders can help patients remember to take drugs on time. If you are missing or forgetting doses you can try linking behaviors, using pill boxes, or an electronic reminder like an alarm on your phone or an app. Some  people can also get automated phone calls as medication reminders.

## 2020-10-13 ENCOUNTER — Other Ambulatory Visit: Payer: Self-pay | Admitting: Family Medicine

## 2020-12-02 ENCOUNTER — Other Ambulatory Visit: Payer: Self-pay

## 2020-12-02 ENCOUNTER — Ambulatory Visit: Payer: Medicare HMO | Attending: Family | Admitting: Family

## 2020-12-02 ENCOUNTER — Encounter: Payer: Self-pay | Admitting: Family

## 2020-12-02 VITALS — BP 161/94 | HR 88 | Resp 14 | Ht 64.0 in | Wt 198.2 lb

## 2020-12-02 DIAGNOSIS — N189 Chronic kidney disease, unspecified: Secondary | ICD-10-CM | POA: Diagnosis not present

## 2020-12-02 DIAGNOSIS — N184 Chronic kidney disease, stage 4 (severe): Secondary | ICD-10-CM

## 2020-12-02 DIAGNOSIS — Z87891 Personal history of nicotine dependence: Secondary | ICD-10-CM | POA: Insufficient documentation

## 2020-12-02 DIAGNOSIS — Z79899 Other long term (current) drug therapy: Secondary | ICD-10-CM | POA: Insufficient documentation

## 2020-12-02 DIAGNOSIS — R0602 Shortness of breath: Secondary | ICD-10-CM | POA: Diagnosis present

## 2020-12-02 DIAGNOSIS — R002 Palpitations: Secondary | ICD-10-CM | POA: Diagnosis not present

## 2020-12-02 DIAGNOSIS — I130 Hypertensive heart and chronic kidney disease with heart failure and stage 1 through stage 4 chronic kidney disease, or unspecified chronic kidney disease: Secondary | ICD-10-CM | POA: Diagnosis not present

## 2020-12-02 DIAGNOSIS — Z794 Long term (current) use of insulin: Secondary | ICD-10-CM | POA: Diagnosis not present

## 2020-12-02 DIAGNOSIS — I5032 Chronic diastolic (congestive) heart failure: Secondary | ICD-10-CM | POA: Diagnosis not present

## 2020-12-02 DIAGNOSIS — Z7982 Long term (current) use of aspirin: Secondary | ICD-10-CM | POA: Diagnosis not present

## 2020-12-02 DIAGNOSIS — R5383 Other fatigue: Secondary | ICD-10-CM | POA: Insufficient documentation

## 2020-12-02 DIAGNOSIS — E1122 Type 2 diabetes mellitus with diabetic chronic kidney disease: Secondary | ICD-10-CM | POA: Diagnosis not present

## 2020-12-02 DIAGNOSIS — E785 Hyperlipidemia, unspecified: Secondary | ICD-10-CM | POA: Diagnosis not present

## 2020-12-02 DIAGNOSIS — Z7901 Long term (current) use of anticoagulants: Secondary | ICD-10-CM | POA: Diagnosis not present

## 2020-12-02 DIAGNOSIS — I10 Essential (primary) hypertension: Secondary | ICD-10-CM

## 2020-12-02 NOTE — Progress Notes (Signed)
Patient ID: Victor Hernandez, male    DOB: Mar 12, 1956, 65 y.o.   MRN: [id number]   Victor Hernandez is a 65 y/o male with a history of DM, hyperlipidemia, HTN, CKD, previous tobacco use and chronic heart failure.   Echo report from 06/01/20 reviewed and showed an EF of 50-55% along with moderate LVH/ LAE and moderate/ severe Victor.   Admitted 07/05/20 due to shortness of breath and fluid overload. Initially given IV lasix with transition to oral diuretics with resultant loss of 12L. Cardiology consult obtained. Discharged after 7 days.   He presents today for a follow-up visit with a chief complaint of minimal shortness of breath upon moderate exertion. He describes this as chronic in nature having been present for several years. He has associated fatigue, difficulty sleeping and palpitations along with this. He denies and dizziness, abdominal distention, pedal edema, chest pain, cough or weight gain.   Admits to some confusion with his medications. Has only been taking entresto as 1 tablet once a day (even though he takes others BID) and he thought he was still taking torsemide but he doesn't have the bottle with him. Says it may be on his kitchen table at home.   Past Medical History:  Diagnosis Date   CHF (congestive heart failure) ([facility])    Chronic kidney disease    Diabetes mellitus without complication ([place])    Hyperlipidemia    Hypertension    Past Surgical History:  Procedure Laterality Date   COLONOSCOPY     COLONOSCOPY WITH PROPOFOL N/A 05/19/2016   Procedure: COLONOSCOPY WITH PROPOFOL;  Surgeon: Christene Lye, MD;  Location: [facility] ENDOSCOPY;  Service: Endoscopy;  Laterality: N/A;   No family history on file. Social History   Tobacco Use   Smoking status: Never   Smokeless tobacco: Never  Substance Use Topics   Alcohol use: No   No Known Allergies  Prior to Admission medications   Medication Sig Start Date End Date Taking? Authorizing Provider  acetaminophen (TYLENOL) 325 MG  tablet Take 650 mg by mouth every 6 (six) hours as needed.   Yes [provider]  amLODipine (NORVASC) 5 MG tablet Take 1 tablet (5 mg total) by mouth 2 (two) times daily. 07/12/20  Yes Lorella Nimrod, MD  aspirin EC 81 MG tablet Take 1 tablet (81 mg total) by mouth daily. 07/12/20  Yes Lorella Nimrod, MD  atorvastatin (LIPITOR) 40 MG tablet Take 40 mg by mouth at bedtime. 02/24/20  Yes [provider]  blood glucose meter kit and supplies Dispense based on patient and insurance preference. Use up to four times daily as directed. (FOR ICD-10 E10.9, E11.9). 06/02/20  Yes Jennye Boroughs, MD  Ferrous Sulfate (SLOW FE) 142 (45 Fe) MG TBCR Take 1 tablet by mouth daily.   Yes [provider]  insulin aspart (NOVOLOG) 100 UNIT/ML injection Inject into the skin. Sliding scale   Yes [provider]  metoprolol tartrate (LOPRESSOR) 50 MG tablet Take 1 tablet (50 mg total) by mouth 2 (two) times daily. 08/12/20  Yes Izzac Rockett A, FNP  sacubitril-valsartan (ENTRESTO) 97-103 MG Taking 1 tablet by mouth once daily. 08/12/20  Yes Darylene Price A, FNP  torsemide (DEMADEX) 100 MG tablet Take 1 tablet (100 mg total) by mouth daily. Patient taking differently: Take 50 mg by mouth daily. (Unsure if taking) 07/13/20   Lorella Nimrod, MD    Review of Systems  Constitutional:  Positive for fatigue (minimal). Negative for appetite change.  HENT:  Positive for rhinorrhea. Negative for congestion and sore throat.   Eyes: Negative.   Respiratory:  Positive for shortness of breath (with moderate exertion). Negative for cough and chest tightness.   Cardiovascular:  Positive for palpitations ("better"). Negative for chest pain and leg swelling.  Gastrointestinal:  Negative for abdominal distention and abdominal pain.  Endocrine: Negative.   Genitourinary: Negative.   Musculoskeletal:  Negative for back pain and neck pain.  Skin: Negative.   Allergic/Immunologic: Negative.   Neurological:   Negative for dizziness, light-headedness and numbness.  Hematological:  Negative for adenopathy. Does not bruise/bleed easily.  Psychiatric/Behavioral:  Positive for sleep disturbance (sleeping on 1-2 pillows; sometimes trouble sleeping). Negative for dysphoric mood. The patient is not nervous/anxious.    Vitals:   12/02/20 0905  BP: (!) 161/94  Pulse: 88  Resp: 14  SpO2: 100%  Weight: 198 lb 4 oz (89.9 kg)  Height: _0  (1.626 m)   Wt Readings from Last 3 Encounters:  12/02/20 198 lb 4 oz (89.9 kg)  10/08/20 200 lb (90.7 kg)  09/07/20 212 lb 2 oz (96.2 kg)   Lab Results  Component Value Date   CREATININE 2.52 (H) 07/12/2020   CREATININE 2.39 (H) 07/11/2020   CREATININE 2.37 (H) 07/10/2020    Physical Exam Vitals and nursing note reviewed.  Constitutional:      Appearance: Normal appearance.  HENT:     Head: Normocephalic and atraumatic.  Cardiovascular:     Rate and Rhythm: Normal rate and regular rhythm.  Pulmonary:     Effort: Pulmonary effort is normal. No respiratory distress.     Breath sounds: No wheezing or rales.  Abdominal:     General: There is no distension.     Palpations: Abdomen is soft.     Tenderness: There is no abdominal tenderness.  Musculoskeletal:        General: No tenderness.     Cervical back: Normal range of motion and neck supple.     Right lower leg: No edema.     Left lower leg: No edema.  Skin:    General: Skin is warm and dry.  Neurological:     General: No focal deficit present.     Mental Status: He is alert and oriented to person, place, and time.  Psychiatric:        Mood and Affect: Mood normal.        Behavior: Behavior normal.        Thought Content: Thought content normal.   Assessment & Plan:  1: Chronic heart failure with preserved ejection fraction with structural changes (LVH/ LAE)- - NYHA class II - euvolemic today - weighing daily; reminded to call for an overnight weight gain of > 2 pounds or a weekly weight  gain of > 5 pounds - weight down 2 pounds from last visit here 6 weeks ago - not adding salt and is trying to be mindful of sodium content of foods - saw cardiology Mickle Plumb) during recent admission - on GDMT of entresto although he's only been taking it daily; advised to take it BID - he thought he was still taking torsemide although doesn't have his bottle with him; he's to go home and check and see if he still has it - unable to use SLGT2 due to renal function - BNP 07/05/20 was 866.4  2: HTN- - BP elevated today (161/94) but he's only been taking entresto daily instead of BID - follows with PCP Lavena Bullion); returns next week (  he thinks) - BMP 09/02/20 reviewed and showed sodium 141, potassium 4.6, creatinine 3.2 and GFR 21 - check BMP next visit if not done at PCP's office; may need nephrology referral  3: DM with CKD- - glucose at home yesterday was 188; not checking his glucose daily because he says the strips are too expensive  - A1c 09/02/20 was 8.9%   Medication bottles reviewed.   Return in 1 month or sooner for any questions/problems before then.

## 2020-12-02 NOTE — Patient Instructions (Addendum)
Continue weighing daily and call for an overnight weight gain of > 2 pounds or a weekly weight gain of >5 pounds.    When you go home, check and see if you have torsemide (fluid pill). Call me at [phone] and let me know whether you have it or not.    Take your entresto as 1 tablet in the morning and 1 tablet in the evening

## 2020-12-27 ENCOUNTER — Telehealth: Payer: Self-pay | Admitting: Family

## 2020-12-27 NOTE — Telephone Encounter (Signed)
Lab results received from PCP office ([facility]) that were collected on 12/06/20:  Sodium 135 Potassium 4.6 Creatinine 3.1 GFR 21 Triglycerides 170 A1c 15.5%

## 2021-01-03 NOTE — Progress Notes (Deleted)
Patient ID: Victor Hernandez, male    DOB: 1955-09-01, 65 y.o.   MRN: [id number]   Victor Hernandez is a 65 y/o male with a history of DM, hyperlipidemia, HTN, CKD, previous tobacco use and chronic heart failure.   Echo report from 06/01/20 reviewed and showed an EF of 50-55% along with moderate LVH/ LAE and moderate/ severe Victor.   Has not been admitted or been in the ED in the last 6 months.   He presents today for a follow-up visit with a chief complaint of   Past Medical History:  Diagnosis Date   CHF (congestive heart failure) ([place])    Chronic kidney disease    Diabetes mellitus without complication ([place])    Hyperlipidemia    Hypertension    Past Surgical History:  Procedure Laterality Date   COLONOSCOPY     COLONOSCOPY WITH PROPOFOL N/A 05/19/2016   Procedure: COLONOSCOPY WITH PROPOFOL;  Surgeon: Victor Lye, MD;  Location: [facility] ENDOSCOPY;  Service: Endoscopy;  Laterality: N/A;   No family history on file. Social History   Tobacco Use   Smoking status: Former    Types: Cigarettes    Quit date: 1976    Years since quitting: 46.6   Smokeless tobacco: Never  Substance Use Topics   Alcohol use: No    Comment: "a beer once in awhile"   No Known Allergies    Review of Systems  Constitutional:  Positive for fatigue (minimal). Negative for appetite change.  HENT:  Positive for rhinorrhea. Negative for congestion and sore throat.   Eyes: Negative.   Respiratory:  Positive for shortness of breath (with moderate exertion). Negative for cough and chest tightness.   Cardiovascular:  Positive for palpitations ("better"). Negative for chest pain and leg swelling.  Gastrointestinal:  Negative for abdominal distention and abdominal pain.  Endocrine: Negative.   Genitourinary: Negative.   Musculoskeletal:  Negative for back pain and neck pain.  Skin: Negative.   Allergic/Immunologic: Negative.   Neurological:  Negative for dizziness, light-headedness and numbness.   Hematological:  Negative for adenopathy. Does not bruise/bleed easily.  Psychiatric/Behavioral:  Positive for sleep disturbance (sleeping on 1-2 pillows; sometimes trouble sleeping). Negative for dysphoric mood. The patient is not nervous/anxious.       Physical Exam Vitals and nursing note reviewed.  Constitutional:      Appearance: Normal appearance.  HENT:     Head: Normocephalic and atraumatic.  Cardiovascular:     Rate and Rhythm: Normal rate and regular rhythm.  Pulmonary:     Effort: Pulmonary effort is normal. No respiratory distress.     Breath sounds: No wheezing or rales.  Abdominal:     General: There is no distension.     Palpations: Abdomen is soft.     Tenderness: There is no abdominal tenderness.  Musculoskeletal:        General: No tenderness.     Cervical back: Normal range of motion and neck supple.     Right lower leg: No edema.     Left lower leg: No edema.  Skin:    General: Skin is warm and dry.  Neurological:     General: No focal deficit present.     Mental Status: He is alert and oriented to person, place, and time.  Psychiatric:        Mood and Affect: Mood normal.        Behavior: Behavior normal.        Thought Content: Thought content  normal.   Assessment & Plan:  1: Chronic heart failure with preserved ejection fraction with structural changes (LVH/ LAE)- - NYHA class II - euvolemic today - weighing daily; reminded to call for an overnight weight gain of > 2 pounds or a weekly weight gain of > 5 pounds - weight 198.4 pounds from last visit here 1 month ago - not adding salt and is trying to be mindful of sodium content of foods - saw cardiology Victor Hernandez) during recent admission - on GDMT of entresto  - he thought he was still taking torsemide although doesn't have his bottle with him; he's to go home and check and see if he still has it - unable to use SLGT2 due to renal function - BNP 07/05/20 was 866.4  2: HTN- - BP  - follows with  PCP Victor Hernandez); returns next week (he thinks) - BMP 09/02/20 reviewed and showed sodium 141, potassium 4.6, creatinine 3.2 and GFR 21 - check BMP as he's now taking entresto BID; may need nephrology referral  3: DM with CKD- - glucose at home yesterday was  - A1c 09/02/20 was 8.9%   Medication bottles reviewed.

## 2021-01-04 ENCOUNTER — Ambulatory Visit: Payer: Medicare HMO | Admitting: Family

## 2021-01-04 ENCOUNTER — Telehealth: Payer: Self-pay | Admitting: Family

## 2021-01-04 NOTE — Telephone Encounter (Signed)
Patient did not show for his [facility] appointment on 01/04/21. Will attempt to reschedule.

## 2021-02-17 ENCOUNTER — Telehealth: Payer: Self-pay

## 2021-02-17 ENCOUNTER — Ambulatory Visit (HOSPITAL_BASED_OUTPATIENT_CLINIC_OR_DEPARTMENT_OTHER): Payer: Medicare HMO | Admitting: Family

## 2021-02-17 ENCOUNTER — Other Ambulatory Visit: Payer: Self-pay

## 2021-02-17 ENCOUNTER — Telehealth: Payer: Self-pay | Admitting: Family

## 2021-02-17 ENCOUNTER — Other Ambulatory Visit
Admission: RE | Admit: 2021-02-17 | Discharge: 2021-02-17 | Disposition: A | Discharge status: Home / Self Care | Payer: Medicare HMO | Source: Ambulatory Visit | Attending: Family | Admitting: Family

## 2021-02-17 ENCOUNTER — Encounter: Payer: Self-pay | Admitting: Family

## 2021-02-17 VITALS — BP 145/90 | HR 99 | Resp 18 | Ht 70.0 in | Wt 206.0 lb

## 2021-02-17 DIAGNOSIS — Z7984 Long term (current) use of oral hypoglycemic drugs: Secondary | ICD-10-CM | POA: Insufficient documentation

## 2021-02-17 DIAGNOSIS — N184 Chronic kidney disease, stage 4 (severe): Secondary | ICD-10-CM

## 2021-02-17 DIAGNOSIS — R42 Dizziness and giddiness: Secondary | ICD-10-CM | POA: Insufficient documentation

## 2021-02-17 DIAGNOSIS — R002 Palpitations: Secondary | ICD-10-CM | POA: Insufficient documentation

## 2021-02-17 DIAGNOSIS — Z09 Encounter for follow-up examination after completed treatment for conditions other than malignant neoplasm: Secondary | ICD-10-CM | POA: Insufficient documentation

## 2021-02-17 DIAGNOSIS — N189 Chronic kidney disease, unspecified: Secondary | ICD-10-CM | POA: Diagnosis not present

## 2021-02-17 DIAGNOSIS — Z7901 Long term (current) use of anticoagulants: Secondary | ICD-10-CM | POA: Insufficient documentation

## 2021-02-17 DIAGNOSIS — E1122 Type 2 diabetes mellitus with diabetic chronic kidney disease: Secondary | ICD-10-CM | POA: Insufficient documentation

## 2021-02-17 DIAGNOSIS — R5383 Other fatigue: Secondary | ICD-10-CM | POA: Insufficient documentation

## 2021-02-17 DIAGNOSIS — I5032 Chronic diastolic (congestive) heart failure: Secondary | ICD-10-CM

## 2021-02-17 DIAGNOSIS — Z87891 Personal history of nicotine dependence: Secondary | ICD-10-CM | POA: Insufficient documentation

## 2021-02-17 DIAGNOSIS — Z794 Long term (current) use of insulin: Secondary | ICD-10-CM | POA: Insufficient documentation

## 2021-02-17 DIAGNOSIS — E785 Hyperlipidemia, unspecified: Secondary | ICD-10-CM | POA: Insufficient documentation

## 2021-02-17 DIAGNOSIS — Z7982 Long term (current) use of aspirin: Secondary | ICD-10-CM | POA: Insufficient documentation

## 2021-02-17 DIAGNOSIS — Z79899 Other long term (current) drug therapy: Secondary | ICD-10-CM | POA: Insufficient documentation

## 2021-02-17 DIAGNOSIS — I130 Hypertensive heart and chronic kidney disease with heart failure and stage 1 through stage 4 chronic kidney disease, or unspecified chronic kidney disease: Secondary | ICD-10-CM | POA: Insufficient documentation

## 2021-02-17 DIAGNOSIS — R0602 Shortness of breath: Secondary | ICD-10-CM | POA: Insufficient documentation

## 2021-02-17 DIAGNOSIS — I10 Essential (primary) hypertension: Secondary | ICD-10-CM

## 2021-02-17 LAB — BASIC METABOLIC PANEL
Anion gap: 13 (ref 5–15)
BUN: 38 mg/dL — ABNORMAL HIGH (ref 8–23)
CO2: 26 mmol/L (ref 22–32)
Calcium: 9.7 mg/dL (ref 8.9–10.3)
Chloride: 103 mmol/L (ref 98–111)
Creatinine, Ser: 3.31 mg/dL — ABNORMAL HIGH (ref 0.61–1.24)
GFR, Estimated: 20 mL/min — ABNORMAL LOW (ref >60)
Glucose, Bld: 42 mg/dL — CL (ref 70–99)
Potassium: 4.3 mmol/L (ref 3.5–5.1)
Sodium: 142 mmol/L (ref 135–145)

## 2021-02-17 NOTE — Telephone Encounter (Signed)
Called patient's PCP and left message for C. Lindley's nurse. Pt currently in the Heart Failure Clinic seeing Darylene Price, NP, and he reports that he is only checking his blood glucose once every other day because of the cost. Patient is prescribed novolog insulin and farxiga 10 mg, and he reports lethargy after taking his insulin.Medication and glucose monitoring counseling given to patient as well.  Georg Ruddle, RN

## 2021-02-17 NOTE — Patient Instructions (Addendum)
Continue weighing daily and call for an overnight weight gain of > 2 pounds or a weekly weight gain of >5 pounds.     Will not refill spironolactone until we get your lab work back

## 2021-02-17 NOTE — Telephone Encounter (Addendum)
Threasa Alpha, PCP, contacted. Left two voicemail messages with their nurse with detailed message. We will also fax over today's lab results and a copy of the below message of changes and new referrals to nephrology and the lifestyle center.  Patient has been called and unreachable at this time. Left a detailed voicemail, and will try to reach patient again throughout the day to let him know the message below.  Georg Ruddle, RN [facility] Failure Clinic   ----- Message from Alisa Graff, [place] sent at 02/17/2021 10:44 AM EDT ----- Tell patient we need to stop his farxiga (pill in the square bottle) due to worsening kidney function. Will also need nephrology appointment made due to worsening kidney function.  Glucose is quite low at 42. Stopping farxiga, he must check his glucose before any insulin. Please make sure he has eaten since he left the office. I will be making a Lifestyle Referral for him to help manage his diabetes.  I'm routing this message to PCP but please call his PCP office and let them know of this as well.

## 2021-02-17 NOTE — Telephone Encounter (Signed)
Received critical alert that patient's glucose was 42. He says that he's been taking his novolog insulin at night but doesn't check his glucose prior to taking it and sometimes he feels dizzy and "funny" afterwards. He had been started on farxiga by his PCP since he was last here as well.   Renal function is also worsening so RN will call patient and have his stop the farxiga, will make nephrology referral as well as lifestyle center referral for his diabetes. Must check glucose prior to taking any insulin.  Will forward information to PCP office as well so they can also follow-up.

## 2021-02-17 NOTE — Progress Notes (Signed)
Patient ID: Victor Hernandez, male    DOB: May 31, 1955, 65 y.o.   MRN: [id number]   Victor Hernandez is a 65 y/o male with a history of DM, hyperlipidemia, HTN, CKD, previous tobacco use and chronic heart failure.   Echo report from 06/01/20 reviewed and showed an EF of 50-55% along with moderate LVH/ LAE and moderate/ severe Victor.   Has not been admitted or been in the ED in the last 6 months.   He presents today for a follow-up visit with a chief complaint of minimal fatigue upon moderate exertion. He describes this as chronic in nature having been present for several months. He has associated shortness of breath, palpitations, light-headedness & difficulty sleeping along with this. He denies any abdominal distention, pedal edema, chest pain, cough or weight gain.  He says that he's been out of spironolactone for a "few days" but the fill date on his empty bottle was a 30 day supply filled on 01/03/21. Wilder Glade has been started by his PCP since he was last here and he mentions that that medication and his entresto is quite expensive.   He says that he's not checking his glucose daily (checks it QOD due to test strip cost) at home even though he continues to take his insulin. Mentions that he only uses his insulin in the evening but, again, he doesn't check his glucose. He says that sometimes after taking the insulin, he feels very lethargic & dizzy until he eats something. Said that he occasionally feels fatigued and dizzy during the day and then after he eats something, those symptoms improve.   Past Medical History:  Diagnosis Date   CHF (congestive heart failure) (HCC)    Chronic kidney disease    Diabetes mellitus without complication ([place])    Hyperlipidemia    Hypertension    Past Surgical History:  Procedure Laterality Date   COLONOSCOPY     COLONOSCOPY WITH PROPOFOL N/A 05/19/2016   Procedure: COLONOSCOPY WITH PROPOFOL;  Surgeon: Christene Lye, MD;  Location: [facility] ENDOSCOPY;  Service:  Endoscopy;  Laterality: N/A;   No family history on file. Social History   Tobacco Use   Smoking status: Former    Types: Cigarettes    Quit date: 1976    Years since quitting: 46.7   Smokeless tobacco: Never  Substance Use Topics   Alcohol use: No    Comment: "a beer once in awhile"   No Known Allergies  Prior to Admission medications   Medication Sig Start Date End Date Taking? Authorizing Provider  acetaminophen (TYLENOL) 325 MG tablet Take 650 mg by mouth every 6 (six) hours as needed.   Yes [provider]  amLODipine (NORVASC) 5 MG tablet Take 1 tablet (5 mg total) by mouth 2 (two) times daily. 07/12/20  Yes Lorella Nimrod, MD  aspirin EC 81 MG tablet Take 1 tablet (81 mg total) by mouth daily. 07/12/20  Yes Lorella Nimrod, MD  atorvastatin (LIPITOR) 40 MG tablet Take 40 mg by mouth at bedtime. 02/24/20  Yes [provider]  blood glucose meter kit and supplies Dispense based on patient and insurance preference. Use up to four times daily as directed. (FOR ICD-10 E10.9, E11.9). 06/02/20  Yes Jennye Boroughs, MD  dapagliflozin propanediol (FARXIGA) 10 MG TABS tablet Take 10 mg by mouth daily.   Yes Remi Haggard, FNP  Ferrous Sulfate (SLOW FE) 142 (45 Fe) MG TBCR Take 1 tablet by mouth daily.   Yes [provider]  insulin aspart (NOVOLOG) 100 UNIT/ML injection Inject into the skin. Sliding scale   Yes [provider]  metoprolol tartrate (LOPRESSOR) 50 MG tablet Take 1 tablet (50 mg total) by mouth 2 (two) times daily. 08/12/20  Yes Cheyne Bungert, Otila Kluver A, FNP  potassium chloride (KLOR-CON) 10 MEQ tablet Take 10 mEq by mouth daily.   Yes [provider]  sacubitril-valsartan (ENTRESTO) 49-51 MG Take 1 tablet by mouth 2 (two) times daily.   Yes [provider]  torsemide (DEMADEX) 100 MG tablet Take 1 tablet (100 mg total) by mouth daily. Patient taking differently: Take 50 mg by mouth daily. 07/13/20  Yes Lorella Nimrod, MD  spironolactone  (ALDACTONE) 25 MG tablet Take 25 mg by mouth daily. Patient not taking: Reported on 02/17/2021    [provider]    Review of Systems  Constitutional:  Positive for fatigue (minimal). Negative for appetite change.  HENT:  Positive for rhinorrhea. Negative for congestion and sore throat.   Eyes: Negative.   Respiratory:  Positive for shortness of breath (with moderate exertion). Negative for cough and chest tightness.   Cardiovascular:  Positive for palpitations ("better"). Negative for chest pain and leg swelling.  Gastrointestinal:  Negative for abdominal distention and abdominal pain.  Endocrine: Negative.   Genitourinary: Negative.   Musculoskeletal:  Negative for back pain and neck pain.  Skin: Negative.   Allergic/Immunologic: Negative.   Neurological:  Positive for light-headedness (on occasion). Negative for dizziness and numbness.  Hematological:  Negative for adenopathy. Does not bruise/bleed easily.  Psychiatric/Behavioral:  Positive for sleep disturbance (sleeping on 1-2 pillows; sometimes trouble sleeping). Negative for dysphoric mood. The patient is not nervous/anxious.    Vitals:   02/17/21 0825  BP: (!) 145/90  Pulse: 99  Resp: 18  SpO2: 100%  Weight: 206 lb (93.4 kg)  Height: _0  (1.778 m)   Wt Readings from Last 3 Encounters:  02/17/21 206 lb (93.4 kg)  12/02/20 198 lb 4 oz (89.9 kg)  10/08/20 200 lb (90.7 kg)   Lab Results  Component Value Date   CREATININE 2.52 (H) 07/12/2020   CREATININE 2.39 (H) 07/11/2020   CREATININE 2.37 (H) 07/10/2020   Physical Exam Vitals and nursing note reviewed.  Constitutional:      Appearance: Normal appearance.  HENT:     Head: Normocephalic and atraumatic.  Cardiovascular:     Rate and Rhythm: Normal rate and regular rhythm.  Pulmonary:     Effort: Pulmonary effort is normal. No respiratory distress.     Breath sounds: No wheezing or rales.  Abdominal:     General: There is no distension.      Palpations: Abdomen is soft.     Tenderness: There is no abdominal tenderness.  Musculoskeletal:        General: No tenderness.     Cervical back: Normal range of motion and neck supple.     Right lower leg: No edema.     Left lower leg: No edema.  Skin:    General: Skin is warm and dry.  Neurological:     General: No focal deficit present.     Mental Status: He is alert and oriented to person, place, and time.  Psychiatric:        Mood and Affect: Mood normal.        Behavior: Behavior normal.        Thought Content: Thought content normal.   Assessment & Plan:  1: Chronic heart failure with preserved ejection  fraction with structural changes (LVH/ LAE)- - NYHA class II - euvolemic today - weighing daily; reminded to call for an overnight weight gain of > 2 pounds or a weekly weight gain of > 5 pounds - weight up 8 pounds from last visit here 2 months ago - not adding salt and is trying to be mindful of sodium content of foods - saw cardiology Mickle Plumb) during recent admission - on GDMT of entresto, farxiga (started by PCP) - has empty spironolactone bottle with fill date of 01/03/21 (6 weeks ago) - will get BMP today before refilling spironolactone due to CKD - [employer] patient assistance forms filled out today for his entresto - BNP 07/05/20 was 866.4  2: HTN- - BP mildly elevated today (145/90) - follows with PCP Lavena Bullion); returns early October - BMP 12/06/20 (from PCP) reviewed and showed sodium 135, potassium 4.6, creatinine 3.1 and GFR 21  3: DM with CKD- - checks his glucose every other day even though he uses novolog in the evening; although unclear if he's using novolog or levemir since he only uses it once/ day. He says that he will feel lethargic and dizzy sometimes after the insulin - emphasized that he MUST check his glucose prior to giving himself the insulin - checking BMP today - A1c 09/02/20 was 8.9%   Medication bottles reviewed.   Return in 1 month or sooner  for any questions/problems before then.

## 2021-02-17 NOTE — Progress Notes (Signed)
Left voice messages for both the patient and the pt's PCP. Faxed lab results and summary of situation, medication changes, and info about referrals to PCP as well.

## 2021-03-16 ENCOUNTER — Telehealth: Payer: Self-pay | Admitting: Family

## 2021-03-16 NOTE — Telephone Encounter (Signed)
Lab results received from PCP office on 03/14/21:  Potassium 4.0 Creatinine 3.1 GFR 21 BUN 33 LDL 112 A1c 11.6%

## 2021-03-21 ENCOUNTER — Other Ambulatory Visit: Payer: Self-pay

## 2021-03-21 ENCOUNTER — Telehealth: Payer: Self-pay | Admitting: Family

## 2021-03-21 ENCOUNTER — Encounter: Payer: Self-pay | Admitting: Family

## 2021-03-21 ENCOUNTER — Ambulatory Visit: Payer: Medicare HMO | Attending: Family | Admitting: Family

## 2021-03-21 ENCOUNTER — Other Ambulatory Visit
Admission: RE | Admit: 2021-03-21 | Discharge: 2021-03-21 | Disposition: A | Discharge status: Home / Self Care | Payer: Medicare HMO | Source: Ambulatory Visit | Attending: Family | Admitting: Family

## 2021-03-21 VITALS — BP 168/84 | HR 105 | Resp 18 | Ht 65.0 in | Wt 204.4 lb

## 2021-03-21 DIAGNOSIS — Z79899 Other long term (current) drug therapy: Secondary | ICD-10-CM | POA: Insufficient documentation

## 2021-03-21 DIAGNOSIS — Z91138 Patient's unintentional underdosing of medication regimen for other reason: Secondary | ICD-10-CM | POA: Diagnosis not present

## 2021-03-21 DIAGNOSIS — Z87891 Personal history of nicotine dependence: Secondary | ICD-10-CM | POA: Insufficient documentation

## 2021-03-21 DIAGNOSIS — N184 Chronic kidney disease, stage 4 (severe): Secondary | ICD-10-CM | POA: Diagnosis not present

## 2021-03-21 DIAGNOSIS — Z7982 Long term (current) use of aspirin: Secondary | ICD-10-CM | POA: Insufficient documentation

## 2021-03-21 DIAGNOSIS — Z7984 Long term (current) use of oral hypoglycemic drugs: Secondary | ICD-10-CM | POA: Insufficient documentation

## 2021-03-21 DIAGNOSIS — Z794 Long term (current) use of insulin: Secondary | ICD-10-CM

## 2021-03-21 DIAGNOSIS — E785 Hyperlipidemia, unspecified: Secondary | ICD-10-CM | POA: Diagnosis not present

## 2021-03-21 DIAGNOSIS — N189 Chronic kidney disease, unspecified: Secondary | ICD-10-CM | POA: Diagnosis not present

## 2021-03-21 DIAGNOSIS — T465X6A Underdosing of other antihypertensive drugs, initial encounter: Secondary | ICD-10-CM | POA: Insufficient documentation

## 2021-03-21 DIAGNOSIS — I10 Essential (primary) hypertension: Secondary | ICD-10-CM | POA: Diagnosis not present

## 2021-03-21 DIAGNOSIS — I5032 Chronic diastolic (congestive) heart failure: Secondary | ICD-10-CM

## 2021-03-21 DIAGNOSIS — I130 Hypertensive heart and chronic kidney disease with heart failure and stage 1 through stage 4 chronic kidney disease, or unspecified chronic kidney disease: Secondary | ICD-10-CM | POA: Insufficient documentation

## 2021-03-21 DIAGNOSIS — E1122 Type 2 diabetes mellitus with diabetic chronic kidney disease: Secondary | ICD-10-CM

## 2021-03-21 LAB — BASIC METABOLIC PANEL
Anion gap: 9 (ref 5–15)
BUN: 45 mg/dL — ABNORMAL HIGH (ref 8–23)
CO2: 27 mmol/L (ref 22–32)
Calcium: 9 mg/dL (ref 8.9–10.3)
Chloride: 105 mmol/L (ref 98–111)
Creatinine, Ser: 3.02 mg/dL — ABNORMAL HIGH (ref 0.61–1.24)
GFR, Estimated: 22 mL/min — ABNORMAL LOW (ref >60)
Glucose, Bld: 288 mg/dL — ABNORMAL HIGH (ref 70–99)
Potassium: 3.5 mmol/L (ref 3.5–5.1)
Sodium: 141 mmol/L (ref 135–145)

## 2021-03-21 LAB — GLUCOSE, CAPILLARY: Glucose-Capillary: 368 mg/dL — ABNORMAL HIGH (ref 70–99)

## 2021-03-21 MED ORDER — HYDRALAZINE HCL 25 MG PO TABS: 25.0000 mg | ORAL_TABLET | Freq: Three times a day (TID) | ORAL | 3 refills | Status: DC

## 2021-03-21 MED ORDER — METOPROLOL TARTRATE 50 MG PO TABS: 50.0000 mg | ORAL_TABLET | Freq: Two times a day (BID) | ORAL | 5 refills | Status: DC

## 2021-03-21 NOTE — Patient Instructions (Addendum)
You are out of Entresto (bottle with X on it), continue to NOT take this  Wilder Glade was supposed to be stopped due to your kidney function, you continued to take this, so continue to take for now, UNTIL we call you with your lab work  You need follow up with kidney MD once the appointment is made   Start hydralazine (apresoline) three times per day (for your high blood pressure)  Keep checking your BP daily  Keep checking your weight daily  Check your blood sugar as directed by your primary care MD  Return in one month to Heart Failure clinic

## 2021-03-21 NOTE — Progress Notes (Signed)
Patient ID: Victor Hernandez, male    DOB: 03-27-1956, 65 y.o.   MRN: [id number]   Victor Hernandez is a 65 y/o male with a history of DM, hyperlipidemia, HTN, CKD, previous tobacco use and chronic heart failure.   Echo report from 06/01/20 reviewed and showed an EF of 50-55% along with moderate LVH/ LAE and moderate/ severe Victor.   Has not been admitted or been in the ED in the last 6 months.   He presents today for a follow-up visit with a chief complaint of minimal fatigue upon moderate exertion. He describes this as chronic in nature having been present for several months. He says he has "good days and bad days", but he is unable to elaborate on his symptoms of the bad days. 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  He denies any abdominal distention, pedal edema, chest pain, palpitations, cough, SOB or weight gain.   He brings his medications with him and each one was reviewed. He continued to take his Wilder Glade and stopped his Entresto. Due to his worsening kidney function, he was previously advised to stop [place]. He takes his novolog 30 units once/day with no regard to his blood glucose level. He endorses that he is confused with how much insulin to take and how often to check his blood sugar. He denies any dizziness, polydipsia, polyuria, blurred vision or worsening fatigue.   He weighs himself daily and reports that he rarely fluctuates with his weight. He is drinking 32 ounces of water/day. Today his wt is 206, up 2 lbs from his last visit on 02/17/01.  BP was 189/106 this am, he had not taken his morning medications, with the exception of his novolog. Took all while present in the examination room.   Past Medical History:  Diagnosis Date   CHF (congestive heart failure) (HCC)    Chronic kidney disease    Diabetes mellitus without complication ([place])    Hyperlipidemia    Hypertension    Past Surgical History:  Procedure Laterality Date   COLONOSCOPY     COLONOSCOPY WITH PROPOFOL  N/A 05/19/2016   Procedure: COLONOSCOPY WITH PROPOFOL;  Surgeon: Christene Lye, MD;  Location: [facility] ENDOSCOPY;  Service: Endoscopy;  Laterality: N/A;   No family history on file. Social History   Tobacco Use   Smoking status: Former    Types: Cigarettes    Quit date: 1976    Years since quitting: 46.8   Smokeless tobacco: Never  Substance Use Topics   Alcohol use: No    Comment: "a beer once in awhile"   No Known Allergies  Prior to Admission medications   Medication Sig Start Date End Date Taking? Authorizing Provider  acetaminophen (TYLENOL) 325 MG tablet Take 650 mg by mouth every 6 (six) hours as needed.   Yes [provider]  amLODipine (NORVASC) 5 MG tablet Take 1 tablet (5 mg total) by mouth 2 (two) times daily. 07/12/20  Yes Lorella Nimrod, MD  aspirin EC 81 MG tablet Take 1 tablet (81 mg total) by mouth daily. 07/12/20  Yes Lorella Nimrod, MD  atorvastatin (LIPITOR) 40 MG tablet Take 40 mg by mouth at bedtime. 02/24/20  Yes [provider]  blood glucose meter kit and supplies Dispense based on patient and insurance preference. Use up to four times daily as directed. (FOR ICD-10 E10.9, E11.9). 06/02/20  Yes Jennye Boroughs, MD  dapagliflozin propanediol (FARXIGA) 10 MG TABS tablet Take 10 mg by mouth daily.  Yes Remi Haggard, FNP  Ferrous Sulfate (SLOW FE) 142 (45 Fe) MG TBCR Take 1 tablet by mouth daily.   Yes [provider]  insulin aspart (NOVOLOG) 100 UNIT/ML injection Inject into the skin. Sliding scale   Yes [provider]  metoprolol tartrate (LOPRESSOR) 50 MG tablet Take 1 tablet (50 mg total) by mouth 2 (two) times daily. 08/12/20  Yes Hackney, Otila Kluver A, FNP  potassium chloride (KLOR-CON) 10 MEQ tablet Take 10 mEq by mouth daily.   Yes [provider]  sacubitril-valsartan (ENTRESTO) 49-51 MG Take 1 tablet by mouth 2 (two) times daily.  Patient not taking: Reported on 03/21/21   Yes [provider]  torsemide  (DEMADEX) 100 MG tablet Take 1 tablet (100 mg total) by mouth daily. Patient taking differently: Take 50 mg by mouth daily. 07/13/20  Yes Lorella Nimrod, MD  spironolactone (ALDACTONE) 25 MG tablet Take 25 mg by mouth daily. Patient not taking: Reported on 02/17/2021    [provider]    Review of Systems  Constitutional:  Positive for fatigue (minimal). Negative for appetite change.  HENT:  Negative for congestion, rhinorrhea and sore throat.   Eyes: Negative.   Respiratory:  Positive for shortness of breath (with moderate exertion). Negative for cough and chest tightness.   Cardiovascular:  Negative for chest pain, palpitations and leg swelling.  Gastrointestinal:  Negative for abdominal distention and abdominal pain.  Endocrine: Negative.  Negative for polydipsia, polyphagia and polyuria.  Genitourinary: Negative.   Musculoskeletal:  Negative for back pain and neck pain.  Skin: Negative.   Allergic/Immunologic: Negative.   Neurological:  Negative for dizziness, syncope, light-headedness, numbness and headaches.  Hematological:  Negative for adenopathy. Does not bruise/bleed easily.  Psychiatric/Behavioral:  Positive for sleep disturbance (sleeping on 1-2 pillows; sometimes trouble sleeping). Negative for dysphoric mood. The patient is not nervous/anxious.    Vitals:   03/21/21 0911 03/21/21 0955  BP: (!) 189/106 (!) 168/84  Pulse: (!) 105   Resp: 18   SpO2: 99%   Weight: 204 lb 6 oz (92.7 kg)   Height: _0  (1.651 m)    Wt Readings from Last 3 Encounters:  03/21/21 204 lb 6 oz (92.7 kg)  02/17/21 206 lb (93.4 kg)  12/02/20 198 lb 4 oz (89.9 kg)    Lab Results  Component Value Date   CREATININE 3.31 (H) 02/17/2021   CREATININE 2.52 (H) 07/12/2020   CREATININE 2.39 (H) 07/11/2020   Physical Exam Vitals and nursing note reviewed.  Constitutional:      Appearance: Normal appearance.  HENT:     Head: Normocephalic and atraumatic.  Cardiovascular:     Rate and  Rhythm: Normal rate and regular rhythm.     Heart sounds: Murmur heard.  Pulmonary:     Effort: Pulmonary effort is normal. No respiratory distress.     Breath sounds: Normal breath sounds. No wheezing or rales.  Abdominal:     General: There is no distension.     Palpations: Abdomen is soft.     Tenderness: There is no abdominal tenderness.  Musculoskeletal:        General: No tenderness.     Cervical back: Normal range of motion and neck supple.     Right lower leg: Edema (trace) present.     Left lower leg: Edema (trace) present.  Skin:    General: Skin is warm and dry.  Neurological:     General: No focal deficit present.  Mental Status: He is alert and oriented to person, place, and time.  Psychiatric:        Mood and Affect: Mood normal.        Behavior: Behavior normal.        Thought Content: Thought content normal.   Assessment & Plan:  1: Chronic heart failure with preserved ejection fraction with structural changes (LVH/ LAE)- - NYHA class II - euvolemic today - weighing daily; reminded to call for an overnight weight gain of > 2 pounds or a weekly weight gain of > 5 pounds - weight up 2 lbs from visit on 02/17/21 - not adding salt and is trying to be mindful of sodium content of foods - takes lopressor - continued to take [place], he was confused about which medication to stop  - stopped Entresto, he was confused about which medication to continue to take. [employer] patient assistance, they report they are waiting on him to provide his proof of income. This may not be feasible for him to navigate in order to get his Entresto. - not on MRA, last GFR 21 - BNP 07/05/20 was 866.4 - has not received flu vaccine and is not interested  2: HTN- - BP 189/106, took his meds in office, re-checked 168/84 - he checks BP most days at home, reports that it has been in the 858'I systolic - will start hydralazine 25 mg TID - follows with PCP Lavena Bullion); returns next week   - BMP 03/14/21 (from PCP) reviewed and showed sodium 135, potassium 4.6, creatinine 3.1 and GFR 21 - recheck BMP today   3: DM with CKD- - sporadically checks his BS - takes novolog 30 units once/day - states someone called him and advised him to only take it once/day but he is unsure who - emphasized that he MUST check his glucose prior to giving himself the insulin - BMP today - educated on the deleterious effects of unmanaged DM on his heart failure and CKD  - A1c 03/14/21 from PCP was 11.6% - referral sent for nephrology appointment    Medication bottles reviewed.   Return in 1 month or sooner for any questions/problems before then.

## 2021-04-11 ENCOUNTER — Encounter: Payer: Self-pay | Admitting: Family

## 2021-04-11 ENCOUNTER — Other Ambulatory Visit: Payer: Self-pay

## 2021-04-11 ENCOUNTER — Ambulatory Visit: Payer: Medicare HMO | Attending: Family | Admitting: Family

## 2021-04-11 VITALS — BP 170/64 | HR 88 | Resp 20 | Ht 70.0 in | Wt 204.5 lb

## 2021-04-11 DIAGNOSIS — Z87891 Personal history of nicotine dependence: Secondary | ICD-10-CM | POA: Insufficient documentation

## 2021-04-11 DIAGNOSIS — Z7984 Long term (current) use of oral hypoglycemic drugs: Secondary | ICD-10-CM | POA: Insufficient documentation

## 2021-04-11 DIAGNOSIS — I130 Hypertensive heart and chronic kidney disease with heart failure and stage 1 through stage 4 chronic kidney disease, or unspecified chronic kidney disease: Secondary | ICD-10-CM | POA: Diagnosis present

## 2021-04-11 DIAGNOSIS — E1122 Type 2 diabetes mellitus with diabetic chronic kidney disease: Secondary | ICD-10-CM | POA: Diagnosis not present

## 2021-04-11 DIAGNOSIS — I5032 Chronic diastolic (congestive) heart failure: Secondary | ICD-10-CM | POA: Diagnosis not present

## 2021-04-11 DIAGNOSIS — E785 Hyperlipidemia, unspecified: Secondary | ICD-10-CM | POA: Diagnosis not present

## 2021-04-11 DIAGNOSIS — I10 Essential (primary) hypertension: Secondary | ICD-10-CM

## 2021-04-11 DIAGNOSIS — Z79899 Other long term (current) drug therapy: Secondary | ICD-10-CM | POA: Insufficient documentation

## 2021-04-11 DIAGNOSIS — N184 Chronic kidney disease, stage 4 (severe): Secondary | ICD-10-CM | POA: Diagnosis not present

## 2021-04-11 DIAGNOSIS — Z794 Long term (current) use of insulin: Secondary | ICD-10-CM | POA: Insufficient documentation

## 2021-04-11 LAB — GLUCOSE, CAPILLARY: Glucose-Capillary: 303 mg/dL — ABNORMAL HIGH (ref 70–99)

## 2021-04-11 NOTE — Progress Notes (Signed)
Patient ID: Victor Hernandez, male    DOB: Mar 31, 1956, 65 y.o.   MRN: [id number]   Mr Culotta is a 65 y/o male with a history of DM, hyperlipidemia, HTN, CKD, previous tobacco use and chronic heart failure.   Echo report from 06/01/20 reviewed and showed an EF of 50-55% along with moderate LVH/ LAE and moderate/ severe MR.   Has not been admitted or been in the ED in the last 6 months.   He presents today for a follow-up visit with a chief complaint of minimal fatigue upon moderate exertion. He describes this as chronic in nature having been present for several months. He says he is "50/50". 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  He denies any abdominal distention, pedal edema, chest pain, palpitations, cough, SOB or weight gain.   He brings his medications with him and each one was reviewed.He checks his blood sugar daily, but forgot this am. He weighs daily and reports that his weight dose not fluctuate more than 3-4 lbs in either direction.   BP was 185/100 this am, rechecked and it was 170/64. He had taken all of his am medications. Reviewed each pill bottle that he presented, and he was not taking his hydralazine TID as directed, rather BID.   Past Medical History:  Diagnosis Date   CHF (congestive heart failure) (HCC)    Chronic kidney disease    Diabetes mellitus without complication ([place])    Hyperlipidemia    Hypertension    Past Surgical History:  Procedure Laterality Date   COLONOSCOPY     COLONOSCOPY WITH PROPOFOL N/A 05/19/2016   Procedure: COLONOSCOPY WITH PROPOFOL;  Surgeon: Christene Lye, MD;  Location: [facility] ENDOSCOPY;  Service: Endoscopy;  Laterality: N/A;   No family history on file. Social History   Tobacco Use   Smoking status: Former    Types: Cigarettes    Quit date: 1976    Years since quitting: 46.9   Smokeless tobacco: Never  Substance Use Topics   Alcohol use: No    Comment: "a beer once in awhile"   No Known Allergies  Prior to  Admission medications   Medication Sig Start Date End Date Taking? Authorizing Provider  acetaminophen (TYLENOL) 325 MG tablet Take 650 mg by mouth every 6 (six) hours as needed.   Yes [provider]  amLODipine (NORVASC) 5 MG tablet Take 1 tablet (5 mg total) by mouth 2 (two) times daily. 07/12/20  Yes Lorella Nimrod, MD  aspirin EC 81 MG tablet Take 1 tablet (81 mg total) by mouth daily. 07/12/20  Yes Lorella Nimrod, MD  atorvastatin (LIPITOR) 40 MG tablet Take 40 mg by mouth at bedtime. 02/24/20  Yes [provider]  blood glucose meter kit and supplies Dispense based on patient and insurance preference. Use up to four times daily as directed. (FOR ICD-10 E10.9, E11.9). 06/02/20  Yes Jennye Boroughs, MD  dapagliflozin propanediol (FARXIGA) 10 MG TABS tablet Take 10 mg by mouth daily.   Yes Remi Haggard, FNP  Ferrous Sulfate (SLOW FE) 142 (45 Fe) MG TBCR Take 1 tablet by mouth daily.   Yes [provider]  hydrALAZINE (APRESOLINE) 25 MG tablet Take 1 tablet (25 mg total) by mouth 3 (three) times daily. 03/21/21 06/19/21 Yes Hackney, Otila Kluver A, FNP  insulin aspart (NOVOLOG) 100 UNIT/ML injection Inject into the skin. Sliding scale   Yes [provider]  metoprolol tartrate (LOPRESSOR) 50 MG tablet Take 1 tablet (  50 mg total) by mouth 2 (two) times daily. 03/21/21  Yes Hackney, Otila Kluver A, FNP  potassium chloride (KLOR-CON) 10 MEQ tablet Take 10 mEq by mouth daily.   Yes [provider]  torsemide (DEMADEX) 100 MG tablet Take 1 tablet (100 mg total) by mouth daily. Patient taking differently: Take 50 mg by mouth daily. 07/13/20  Yes Lorella Nimrod, MD    Review of Systems  Constitutional:  Positive for fatigue (minimal). Negative for appetite change.  HENT:  Negative for congestion, rhinorrhea and sore throat.   Eyes: Negative.   Respiratory:  Positive for shortness of breath (with moderate exertion). Negative for cough and chest tightness.   Cardiovascular:   Negative for chest pain, palpitations and leg swelling.  Gastrointestinal:  Negative for abdominal distention and abdominal pain.  Endocrine: Negative.  Negative for polydipsia, polyphagia and polyuria.  Genitourinary: Negative.   Musculoskeletal:  Negative for back pain and neck pain.  Skin: Negative.   Allergic/Immunologic: Negative.   Neurological:  Negative for dizziness, syncope, light-headedness, numbness and headaches.  Hematological:  Negative for adenopathy. Does not bruise/bleed easily.  Psychiatric/Behavioral:  Positive for sleep disturbance (sleeping on 1-2 pillows; sometimes trouble sleeping). Negative for dysphoric mood. The patient is not nervous/anxious.    Vitals:   04/11/21 0850 04/11/21 0908  BP: (!) 185/100 (!) 170/64  Pulse: (!) 116 88  Resp: 20   SpO2: 100%   Weight: 204 lb 8 oz (92.8 kg)   Height: '5\' 10"'  (1.778 m)    Wt Readings from Last 3 Encounters:  04/11/21 204 lb 8 oz (92.8 kg)  03/21/21 204 lb 6 oz (92.7 kg)  02/17/21 206 lb (93.4 kg)    Lab Results  Component Value Date   CREATININE 3.02 (H) 03/21/2021   CREATININE 3.31 (H) 02/17/2021   CREATININE 2.52 (H) 07/12/2020   Physical Exam Vitals and nursing note reviewed.  Constitutional:      General: He is not in acute distress.    Appearance: Normal appearance.  HENT:     Head: Normocephalic and atraumatic.  Cardiovascular:     Rate and Rhythm: Normal rate and regular rhythm.     Heart sounds: Murmur heard.  Pulmonary:     Effort: Pulmonary effort is normal. No respiratory distress.     Breath sounds: Normal breath sounds. No wheezing or rales.  Abdominal:     General: There is no distension.     Palpations: Abdomen is soft.     Tenderness: There is no abdominal tenderness.  Musculoskeletal:        General: No tenderness.     Cervical back: Normal range of motion and neck supple.     Right lower leg: Edema (trace) present.     Left lower leg: Edema (trace) present.  Skin:    General:  Skin is warm and dry.  Neurological:     General: No focal deficit present.     Mental Status: He is alert and oriented to person, place, and time.  Psychiatric:        Mood and Affect: Mood normal.        Behavior: Behavior normal.        Thought Content: Thought content normal.   Assessment & Plan:  1: Chronic heart failure with preserved ejection fraction with structural changes (LVH/ LAE)- - NYHA class II - euvolemic today - weighing daily; reminded to call for an overnight weight gain of > 2 pounds or a weekly weight gain of >  5 pounds - weight 204 today, down  2lbs from visit on 03/21/21 - not adding salt and is trying to be mindful of sodium content of foods - takes lopressor and farxiga - not on MRA, last GFR 21 - BNP 07/05/20 was 866.4 - has not received flu vaccine and is not interested  2: HTN- - BP 185/100, had taken all of his am meds, re-checked 170/64 - he checks BP most days at home, reports that it has been in the 412'K systolic - he states it is always elevated in spite of various treatment modalities - hydralazine 25 mg TID started at last visit, however he thought it was to be BID, highlighted on bottle to take TID - follows with PCP Lavena Bullion); next appointment on 05/16/21, he is to provide a urine sample this week as well - BMP 03/14/21 (from PCP) reviewed and showed sodium 135, potassium 4.6, creatinine 3.1 and GFR 21 - states he has had recent labs from PCP, will reach out to office to see if we can get results of labwork and recent notes  3: DM with CKD- - sporadically checks his BS - takes novolog 30 units once/day - BS 303 in office, he had taken his insulin, non-fasting and had eaten oatmeal from a fast food establisment - educated on the deleterious effects of unmanaged DM on his heart failure and CKD  - A1c 03/14/21 from PCP was 11.6% - referral sent for nephrology appointment at last visit but he states he has not heard from them.  - is to call PCP  office today or tomorrow to let her know he has not heard from nephrology    Medication bottles reviewed.   Return in 2 month or sooner for any questions/problems before then.

## 2021-04-11 NOTE — Patient Instructions (Signed)
Take hydralazine THREE times/day for your BP  Contact Dr. Lavena Bullion to follow up with the kidney doctor appointment  Return in 2 months

## 2021-05-25 ENCOUNTER — Telehealth: Payer: Self-pay | Admitting: Family

## 2021-05-25 NOTE — Telephone Encounter (Signed)
Notified patient that he was approved for patient assistance for farxiga and a shipment was sent to his house.   Lucresia Simic, NT

## 2021-05-26 ENCOUNTER — Other Ambulatory Visit: Payer: Self-pay | Admitting: Family Medicine

## 2021-06-09 ENCOUNTER — Telehealth: Payer: Self-pay | Admitting: Family

## 2021-06-09 NOTE — Telephone Encounter (Signed)
LVM with patient that we need proof of income for patient for novartis patient assistance for Entresto.   Argus Caraher, NT

## 2021-06-13 ENCOUNTER — Other Ambulatory Visit: Payer: Self-pay

## 2021-06-13 ENCOUNTER — Encounter: Payer: Self-pay | Admitting: Family

## 2021-06-13 ENCOUNTER — Ambulatory Visit: Payer: Medicare HMO | Attending: Family | Admitting: Family

## 2021-06-13 VITALS — BP 148/84 | HR 98 | Resp 20 | Ht 70.0 in | Wt 201.2 lb

## 2021-06-13 DIAGNOSIS — N184 Chronic kidney disease, stage 4 (severe): Secondary | ICD-10-CM

## 2021-06-13 DIAGNOSIS — Z7984 Long term (current) use of oral hypoglycemic drugs: Secondary | ICD-10-CM | POA: Insufficient documentation

## 2021-06-13 DIAGNOSIS — E785 Hyperlipidemia, unspecified: Secondary | ICD-10-CM | POA: Insufficient documentation

## 2021-06-13 DIAGNOSIS — T465X6A Underdosing of other antihypertensive drugs, initial encounter: Secondary | ICD-10-CM | POA: Insufficient documentation

## 2021-06-13 DIAGNOSIS — I10 Essential (primary) hypertension: Secondary | ICD-10-CM

## 2021-06-13 DIAGNOSIS — Z79899 Other long term (current) drug therapy: Secondary | ICD-10-CM | POA: Diagnosis not present

## 2021-06-13 DIAGNOSIS — Z794 Long term (current) use of insulin: Secondary | ICD-10-CM | POA: Diagnosis not present

## 2021-06-13 DIAGNOSIS — E1122 Type 2 diabetes mellitus with diabetic chronic kidney disease: Secondary | ICD-10-CM | POA: Diagnosis not present

## 2021-06-13 DIAGNOSIS — I130 Hypertensive heart and chronic kidney disease with heart failure and stage 1 through stage 4 chronic kidney disease, or unspecified chronic kidney disease: Secondary | ICD-10-CM | POA: Insufficient documentation

## 2021-06-13 DIAGNOSIS — N189 Chronic kidney disease, unspecified: Secondary | ICD-10-CM | POA: Diagnosis not present

## 2021-06-13 DIAGNOSIS — Z9114 Patient's other noncompliance with medication regimen: Secondary | ICD-10-CM | POA: Diagnosis not present

## 2021-06-13 DIAGNOSIS — I5032 Chronic diastolic (congestive) heart failure: Secondary | ICD-10-CM | POA: Diagnosis not present

## 2021-06-13 NOTE — Progress Notes (Signed)
Patient ID: Victor Hernandez, male    DOB: 1955-11-16, 66 y.o.   MRN: [id number]   Victor Hernandez is a 66 y/o male with a history of DM, hyperlipidemia, HTN, CKD, previous tobacco use and chronic heart failure.   Echo report from 06/01/20 reviewed and showed an EF of 50-55% along with moderate LVH/ LAE and moderate/ severe Victor.   Has not been admitted or been in the ED in the last 6 months.   He presents today for a follow-up visit with a chief complaint of minimal fatigue upon moderate exertion. He describes this as chronic in nature having been present for several months. He says he is "50/50". 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  He denies any abdominal distention, pedal edema, chest pain, palpitations, cough, SOB or weight gain.   He brings his medications with him and each one was reviewed. He checks his blood sugar daily, but does not recall what it was today. He weighs daily and reports that his weight dose not fluctuate more than 3-4 lbs in either direction. His weight today is 201, down from 204 at his last visit.   BP was 175/115 this am, rechecked and it was 148/84. He had taken all of his am medications about 20 minutes PTA.   Reviewed each pill bottle that he presented, and he was not taking his hydralazine TID as directed. He advised more often than not he takes it BID.   Past Medical History:  Diagnosis Date   CHF (congestive heart failure) (HCC)    Chronic kidney disease    Diabetes mellitus without complication ([place])    Hyperlipidemia    Hypertension    Past Surgical History:  Procedure Laterality Date   COLONOSCOPY     COLONOSCOPY WITH PROPOFOL N/A 05/19/2016   Procedure: COLONOSCOPY WITH PROPOFOL;  Surgeon: Christene Lye, MD;  Location: [facility] ENDOSCOPY;  Service: Endoscopy;  Laterality: N/A;   No family history on file. Social History   Tobacco Use   Smoking status: Former    Types: Cigarettes    Quit date: 1976    Years since quitting: 47.0    Smokeless tobacco: Never  Substance Use Topics   Alcohol use: No    Comment: "a beer once in awhile"   No Known Allergies  Prior to Admission medications   Medication Sig Start Date End Date Taking? Authorizing Provider  acetaminophen (TYLENOL) 325 MG tablet Take 650 mg by mouth every 6 (six) hours as needed.   Yes [provider]  amLODipine (NORVASC) 5 MG tablet Take 1 tablet (5 mg total) by mouth 2 (two) times daily. 07/12/20  Yes Lorella Nimrod, MD  aspirin EC 81 MG tablet Take 1 tablet (81 mg total) by mouth daily. 07/12/20  Yes Lorella Nimrod, MD  atorvastatin (LIPITOR) 40 MG tablet Take 40 mg by mouth at bedtime. 02/24/20  Yes [provider]  blood glucose meter kit and supplies Dispense based on patient and insurance preference. Use up to four times daily as directed. (FOR ICD-10 E10.9, E11.9). 06/02/20  Yes Jennye Boroughs, MD  dapagliflozin propanediol (FARXIGA) 10 MG TABS tablet Take 10 mg by mouth daily.   Yes Remi Haggard, FNP  Ferrous Sulfate (SLOW FE) 142 (45 Fe) MG TBCR Take 1 tablet by mouth daily.   Yes [provider]  hydrALAZINE (APRESOLINE) 25 MG tablet Take 1 tablet (25 mg total) by mouth 3 (three) times daily. 03/21/21 06/19/21 Yes Darylene Price  A, FNP  insulin aspart (NOVOLOG) 100 UNIT/ML injection Inject into the skin. Sliding scale   Yes [provider]  metoprolol tartrate (LOPRESSOR) 50 MG tablet Take 1 tablet (50 mg total) by mouth 2 (two) times daily. 03/21/21  Yes Hackney, Otila Kluver A, FNP  potassium chloride (KLOR-CON) 10 MEQ tablet Take 10 mEq by mouth daily.   Yes [provider]  torsemide (DEMADEX) 100 MG tablet Take 1 tablet (100 mg total) by mouth daily. Patient taking differently: Take 50 mg by mouth daily. 07/13/20  Yes Lorella Nimrod, MD    Review of Systems  Constitutional:  Positive for fatigue (minimal). Negative for appetite change.  HENT:  Negative for congestion, rhinorrhea and sore throat.   Eyes: Negative.    Respiratory:  Positive for shortness of breath (with moderate exertion). Negative for cough and chest tightness.   Cardiovascular:  Negative for chest pain, palpitations and leg swelling.  Gastrointestinal:  Negative for abdominal distention and abdominal pain.  Endocrine: Negative.  Negative for polydipsia, polyphagia and polyuria.  Genitourinary: Negative.   Musculoskeletal:  Negative for back pain and neck pain.  Skin: Negative.   Allergic/Immunologic: Negative.   Neurological:  Negative for dizziness, syncope, light-headedness, numbness and headaches.  Hematological:  Negative for adenopathy. Does not bruise/bleed easily.  Psychiatric/Behavioral:  Positive for sleep disturbance (sleeping on 1-2 pillows; sometimes trouble sleeping). Negative for dysphoric mood. The patient is not nervous/anxious.    Vitals:   06/13/21 0858 06/13/21 0929  BP: (!) 175/113 (!) 148/84  Pulse: 98   Resp: 20   SpO2: 100%   Weight: 201 lb 4 oz (91.3 kg)   Height: _0  (1.778 m)    Wt Readings from Last 3 Encounters:  06/13/21 201 lb 4 oz (91.3 kg)  04/11/21 204 lb 8 oz (92.8 kg)  03/21/21 204 lb 6 oz (92.7 kg)    Lab Results  Component Value Date   CREATININE 3.02 (H) 03/21/2021   CREATININE 3.31 (H) 02/17/2021   CREATININE 2.52 (H) 07/12/2020   Physical Exam Vitals and nursing note reviewed.  Constitutional:      General: He is not in acute distress.    Appearance: Normal appearance.  HENT:     Head: Normocephalic and atraumatic.  Cardiovascular:     Rate and Rhythm: Normal rate and regular rhythm.     Pulses: Normal pulses.     Heart sounds: Murmur heard.  Pulmonary:     Effort: Pulmonary effort is normal. No respiratory distress.     Breath sounds: Normal breath sounds. No wheezing or rales.  Abdominal:     General: There is no distension.     Palpations: Abdomen is soft.     Tenderness: There is no abdominal tenderness.  Musculoskeletal:        General: No tenderness.      Cervical back: Normal range of motion and neck supple.     Right lower leg: Edema (+1) present.     Left lower leg: Edema (+1) present.  Skin:    General: Skin is warm and dry.  Neurological:     General: No focal deficit present.     Mental Status: He is alert and oriented to person, place, and time.  Psychiatric:        Mood and Affect: Mood normal.        Behavior: Behavior normal.        Thought Content: Thought content normal.   Assessment & Plan:  1: Chronic heart  failure with preserved ejection fraction with structural changes (LVH/ LAE)- - NYHA class II - euvolemic today - weighing daily; reminded to call for an overnight weight gain of > 2 pounds or a weekly weight gain of > 5 pounds - weight 201 today, down 3lbs from last visit - not adding salt and is trying to be mindful of sodium content of foods - takes lopressor and farxiga - not on MRA, last GFR 21 - BNP 07/05/20 was 866.4  2: HTN- - BP 175/115, had taken all of his am meds 20 minutes PTA, re-checked 148/84 - he states it is always elevated in spite of various treatment modalities - hydralazine 25 mg TID started at last visit, however he thought it was to be BID, reiterated TID and provided a schedule of when to take - discussed increasing his amlodipine but he wanted to see if he could adhere to TID schedule - follows with PCP Lavena Bullion); next appointment "1-2 weeks"  - BMP 03/14/21 (from PCP) reviewed and showed sodium 135, potassium 4.6, creatinine 3.1 and GFR 21 - states he has had recent labs from PCP, requested previously from office, will reach out and request again  3: DM with CKD- - sporadically checks his BS - takes novolog 30 units once/day - educated on the deleterious effects of unmanaged DM on his heart failure and CKD  - A1c 03/14/21 from PCP was 11.6% - referral sent for nephrology appointment at last visit but he states he has not heard from them.  - he states his PCP has taken several "urine  samples" and he was recently given [place] to take, although he has not taken it as the provider did not explicitly tell him to take it. Advised him to call their office for clarification.    Medication bottles reviewed.   Return in 1 month or sooner for any questions/problems before then.

## 2021-06-13 NOTE — Patient Instructions (Signed)
We will be moving to a new site on 2/10, [place], 2nd floor, [address].  Take hydralazine THREE TIMES/DAY.  Contact/ask Dr. Lavena Bullion if you should be taking Carrington Clamp, the sample she gave you at your last visit.  Call for weight gain/worsening HF symptoms.  Return in 1 month.

## 2021-06-15 ENCOUNTER — Other Ambulatory Visit: Payer: Self-pay | Admitting: Family Medicine

## 2021-06-20 ENCOUNTER — Telehealth (HOSPITAL_COMMUNITY): Payer: Self-pay

## 2021-06-20 ENCOUNTER — Telehealth: Payer: Self-pay | Admitting: Family

## 2021-06-20 NOTE — Telephone Encounter (Signed)
Received BMP results from his PCP:  Dated 03/14/21:  Sodium 141 Potassium 4.0 BUN 33 Creatine 3.1 eGFR 21 Triglycerides 164 LDL 112   Dated 04/06/21: Urine microalbumin 973 Microalb/creat ratio 2862

## 2021-06-20 NOTE — Telephone Encounter (Signed)
Attempted to contact for home visit but unable to reach him.  Have left messages for him to return my call.  Will continue to try to reach.   [place] [phone]

## 2021-06-23 ENCOUNTER — Other Ambulatory Visit: Payer: Self-pay | Admitting: Family Medicine

## 2021-06-23 ENCOUNTER — Telehealth (HOSPITAL_COMMUNITY): Payer: Self-pay

## 2021-06-23 NOTE — Telephone Encounter (Signed)
Was able to contact Victor Hernandez after talking with his sister to get the right number.  Changed his number in Epic.  Spoke to him about the program and he said he is never at home, attempted to set up appt but he states to call back next week.  Will call him next week to try again to visit for heart failure, diet and medication management.   [place] [phone]

## 2021-07-05 ENCOUNTER — Telehealth: Payer: Self-pay | Admitting: Family

## 2021-07-05 NOTE — Telephone Encounter (Signed)
Called and LVM with patient with reminder that we need proof of income for his novartis application to get assistance for Entresto.    Ketura Sirek, NT

## 2021-07-13 ENCOUNTER — Other Ambulatory Visit: Payer: Self-pay | Admitting: Family

## 2021-07-13 DIAGNOSIS — I10 Essential (primary) hypertension: Secondary | ICD-10-CM

## 2021-07-14 ENCOUNTER — Ambulatory Visit (HOSPITAL_BASED_OUTPATIENT_CLINIC_OR_DEPARTMENT_OTHER): Payer: Medicare HMO | Admitting: Family

## 2021-07-14 ENCOUNTER — Encounter: Payer: Self-pay | Admitting: Pharmacist

## 2021-07-14 ENCOUNTER — Other Ambulatory Visit: Payer: Self-pay

## 2021-07-14 ENCOUNTER — Encounter: Payer: Self-pay | Admitting: Family

## 2021-07-14 ENCOUNTER — Other Ambulatory Visit
Admission: RE | Admit: 2021-07-14 | Discharge: 2021-07-14 | Disposition: A | Discharge status: Home / Self Care | Payer: Medicare HMO | Source: Ambulatory Visit | Attending: Family | Admitting: Family

## 2021-07-14 VITALS — BP 160/106 | Resp 16 | Ht 66.0 in | Wt 210.1 lb

## 2021-07-14 DIAGNOSIS — N184 Chronic kidney disease, stage 4 (severe): Secondary | ICD-10-CM | POA: Diagnosis not present

## 2021-07-14 DIAGNOSIS — Z09 Encounter for follow-up examination after completed treatment for conditions other than malignant neoplasm: Secondary | ICD-10-CM | POA: Insufficient documentation

## 2021-07-14 DIAGNOSIS — I509 Heart failure, unspecified: Secondary | ICD-10-CM | POA: Diagnosis present

## 2021-07-14 DIAGNOSIS — Z87891 Personal history of nicotine dependence: Secondary | ICD-10-CM | POA: Insufficient documentation

## 2021-07-14 DIAGNOSIS — Z79899 Other long term (current) drug therapy: Secondary | ICD-10-CM | POA: Insufficient documentation

## 2021-07-14 DIAGNOSIS — E785 Hyperlipidemia, unspecified: Secondary | ICD-10-CM | POA: Insufficient documentation

## 2021-07-14 DIAGNOSIS — N189 Chronic kidney disease, unspecified: Secondary | ICD-10-CM | POA: Insufficient documentation

## 2021-07-14 DIAGNOSIS — R5383 Other fatigue: Secondary | ICD-10-CM | POA: Insufficient documentation

## 2021-07-14 DIAGNOSIS — I10 Essential (primary) hypertension: Secondary | ICD-10-CM

## 2021-07-14 DIAGNOSIS — I130 Hypertensive heart and chronic kidney disease with heart failure and stage 1 through stage 4 chronic kidney disease, or unspecified chronic kidney disease: Secondary | ICD-10-CM | POA: Insufficient documentation

## 2021-07-14 DIAGNOSIS — I5032 Chronic diastolic (congestive) heart failure: Secondary | ICD-10-CM | POA: Insufficient documentation

## 2021-07-14 DIAGNOSIS — E1122 Type 2 diabetes mellitus with diabetic chronic kidney disease: Secondary | ICD-10-CM

## 2021-07-14 DIAGNOSIS — Z7984 Long term (current) use of oral hypoglycemic drugs: Secondary | ICD-10-CM | POA: Insufficient documentation

## 2021-07-14 DIAGNOSIS — Z794 Long term (current) use of insulin: Secondary | ICD-10-CM

## 2021-07-14 LAB — BASIC METABOLIC PANEL
Anion gap: 7 (ref 5–15)
BUN: 46 mg/dL — ABNORMAL HIGH (ref 8–23)
CO2: 24 mmol/L (ref 22–32)
Calcium: 8.3 mg/dL — ABNORMAL LOW (ref 8.9–10.3)
Chloride: 108 mmol/L (ref 98–111)
Creatinine, Ser: 3.97 mg/dL — ABNORMAL HIGH (ref 0.61–1.24)
GFR, Estimated: 16 mL/min — ABNORMAL LOW (ref >60)
Glucose, Bld: 319 mg/dL — ABNORMAL HIGH (ref 70–99)
Potassium: 3.8 mmol/L (ref 3.5–5.1)
Sodium: 139 mmol/L (ref 135–145)

## 2021-07-14 MED ORDER — HYDRALAZINE HCL 25 MG PO TABS: 50.0000 mg | ORAL_TABLET | Freq: Two times a day (BID) | ORAL | 5 refills | Status: DC

## 2021-07-14 NOTE — Progress Notes (Signed)
[place] - PHARMACIST COUNSELING NOTE  *HFpEF*  Guideline-Directed Medical Therapy/Evidence Based Medicine  ACE/ARB/ARNI:  none until renal function stable Beta Blocker: Metoprolol tartrate 50 mg twice daily Aldosterone Antagonist:  none until renal function stable Diuretic: Torsemide 50 mg daily SGLT2i: Dapagliflozin 10 mg daily  Adherence Assessment  Do you ever forget to take your medication? '[]' Yes '[]' No  Do you ever skip doses due to side effects? '[]' Yes '[]' No  Do you have trouble affording your medicines? '[]' Yes '[]' No  Are you ever unable to pick up your medication due to transportation difficulties? '[]' Yes '[]' No  Do you ever stop taking your medications because you don't believe they are helping? '[]' Yes '[]' No  Do you check your weight daily? '[]' Yes '[]' No   Adherence strategy: uses pill box M-F  Barriers to obtaining medications: none  Vital signs: BP 160/106, weight (pounds) 210 lb  ECHO: 06/01/20, showed an EF of 50-55% along with moderate LVH/ LAE and moderate/ severe MR  BMP Latest Ref Rng & Units 07/14/2021 03/21/2021 02/17/2021  Glucose 70 - 99 mg/dL 319(H) 288(H) 42(LL)  BUN 8 - 23 mg/dL 46(H) 45(H) 38(H)  Creatinine 0.61 - 1.24 mg/dL 3.97(H) 3.02(H) 3.31(H)  Sodium 135 - 145 mmol/L 139 141 142  Potassium 3.5 - 5.1 mmol/L 3.8 3.5 4.3  Chloride 98 - 111 mmol/L 108 105 103  CO2 22 - 32 mmol/L '24 27 26  ' Calcium 8.9 - 10.3 mg/dL 8.3(L) 9.0 9.7    Past Medical History:  Diagnosis Date   CHF (congestive heart failure) (HCC)    Chronic kidney disease    Diabetes mellitus without complication ([place])    Hyperlipidemia    Hypertension     ASSESSMENT 66 year old male who presents to the HF clinic for follow up. Noted PMH includes compliance issues, CKD, uncontrolled diabetes, HLD, uncontrolled HTN, and previous tobacco use. Patient previously referred to nephrology but no appointment completed yet. He expressed some issues  affording Entresto and understanding some of his medication. Referral placed to [facility] EMT for mediation management, but patient rejected intervention due to lack of time for visits. Patient initially refuses assistance with diet and medication management.  Waiting for proof of income to complete patient assistance application.  Recent ED Visit (past 6 months): none in last 6 months  Noted patient presents with elevated BP and some LEE. Abdomen oft and lungs are clear, suggestive of amlodipine ADR.  [place] list [facility] Document) provided as well as CDC kidney disease patient education for emphasis on sodium, diabetes, and BP management to prevent worsening renal function.   PLAN - Continue working on low sodium efforts and stay hydrated. - Pill box provided for 7 days AM and PM medication planning. - Increase hydralazine dose to 28m BID (patient unable to remember TID dose). - Continue to HOLD Entresto for now, until renal function stable and follow up with nephrology completed. - May consider increasing amlodipine to 195mdaily if LEE resolved or stable and additional BP control needed.   Time spent: 20 minutes  Laquia Rosano Rodriguez-Guzman PharmD, BCPS 07/14/2021 1:04 PM   Current Outpatient Medications:    acetaminophen (TYLENOL) 325 MG tablet, Take 650 mg by mouth every 6 (six) hours as needed., Disp: , Rfl:    amLODipine (NORVASC) 5 MG tablet, Take 1 tablet (5 mg total) by mouth 2 (two) times daily., Disp: 60 tablet, Rfl: 1   aspirin EC 81 MG tablet, Take 1 tablet (81 mg total)  by mouth daily., Disp: 30 tablet, Rfl: 11   atorvastatin (LIPITOR) 40 MG tablet, Take 40 mg by mouth every morning., Disp: , Rfl:    blood glucose meter kit and supplies, Dispense based on patient and insurance preference. Use up to four times daily as directed. (FOR ICD-10 E10.9, E11.9)., Disp: 1 each, Rfl: 0   dapagliflozin propanediol (FARXIGA) 10 MG TABS tablet, Take 10 mg by mouth daily., Disp: ,  Rfl:    Ferrous Sulfate (SLOW FE) 142 (45 Fe) MG TBCR, Take 1 tablet by mouth daily., Disp: , Rfl:    Finerenone (KERENDIA) 10 MG TABS, Take 10 mg by mouth daily., Disp: , Rfl:    hydrALAZINE (APRESOLINE) 25 MG tablet, Take 2 tablets (50 mg total) by mouth in the morning and at bedtime., Disp: 120 tablet, Rfl: 5   insulin aspart (NOVOLOG) 100 UNIT/ML injection, Inject 30 Units into the skin daily. Sliding scale, Disp: , Rfl:    metoprolol tartrate (LOPRESSOR) 50 MG tablet, Take 1 tablet (50 mg total) by mouth 2 (two) times daily., Disp: 60 tablet, Rfl: 5   potassium chloride (KLOR-CON) 10 MEQ tablet, Take 10 mEq by mouth daily., Disp: , Rfl:    torsemide (DEMADEX) 100 MG tablet, Take 1 tablet (100 mg total) by mouth daily. (Patient taking differently: Take 50 mg by mouth daily.), Disp: 30 tablet, Rfl: 1  MEDICATION ADHERENCES TIPS AND STRATEGIES Taking medication as prescribed improves patient outcomes in heart failure (reduces hospitalizations, improves symptoms, increases survival) Side effects of medications can be managed by decreasing doses, switching agents, stopping drugs, or adding additional therapy. Please let someone in the [facility] know if you have having bothersome side effects so we can modify your regimen. Do not alter your medication regimen without talking to [place].  Medication reminders can help patients remember to take drugs on time. If you are missing or forgetting doses you can try linking behaviors, using pill boxes, or an electronic reminder like an alarm on your phone or an app. Some people can also get automated phone calls as medication reminders.

## 2021-07-14 NOTE — Patient Instructions (Signed)
Start taking hydralazine (apresoline), two tablets twice daily.  Return in 1 month.

## 2021-07-14 NOTE — Progress Notes (Signed)
Victor Hernandez ID: MIR FULLILOVE, male    DOB: 1955-11-04, 66 y.o.   MRN: [id number]   Victor Hernandez is a 66 y/o male with a history of DM, hyperlipidemia, HTN, CKD, previous tobacco use and chronic heart failure.   Echo report from 06/01/20 reviewed and showed an EF of 50-55% along with moderate LVH/ LAE and moderate/ severe Victor.   Has not been admitted or been in Victor ED in Victor last 6 months.   Victor Hernandez presents today for a follow-up visit with a chief complaint of minimal fatigue upon moderate exertion. Victor Hernandez describes this as chronic in nature having been present for several months. Victor Hernandez says Victor Hernandez is "50/50". 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  Victor Hernandez denies any abdominal distention, pedal edema, chest pain, palpitations, cough, SOB or weight gain.   Victor Hernandez brings Victor Hernandez medications with Victor Hernandez and each one was reviewed. Victor Hernandez checks Victor Hernandez blood sugar daily, but does not recall what it was today.   BP was 160/106 this am, Victor Hernandez took Victor Hernandez medications en route to Victor appointment, rechecked and it was 148/84.   Reviewed each pill bottle that Victor Hernandez presented, and Victor Hernandez was not taking Victor Hernandez hydralazine TID as directed. Victor Hernandez advised more often than not Victor Hernandez takes it BID.   Past Medical History:  Diagnosis Date   CHF (congestive heart failure) (HCC)    Chronic kidney disease    Diabetes mellitus without complication ([place])    Hyperlipidemia    Hypertension    Past Surgical History:  Procedure Laterality Date   COLONOSCOPY     COLONOSCOPY WITH PROPOFOL N/A 05/19/2016   Procedure: COLONOSCOPY WITH PROPOFOL;  Surgeon: Christene Lye, MD;  Location: [facility] ENDOSCOPY;  Service: Endoscopy;  Laterality: N/A;   No family history on file. Social History   Tobacco Use   Smoking status: Former    Types: Cigarettes    Quit date: 1976    Years since quitting: 47.1   Smokeless tobacco: Never  Substance Use Topics   Alcohol use: No    Comment: "a beer once in awhile"   No Known Allergies  Prior to Admission medications    Medication Sig Start Date End Date Taking? Authorizing Provider  acetaminophen (TYLENOL) 325 MG tablet Take 650 mg by mouth every 6 (six) hours as needed.   Yes [provider]  amLODipine (NORVASC) 5 MG tablet Take 1 tablet (5 mg total) by mouth 2 (two) times daily. 07/12/20  Yes Lorella Nimrod, MD  aspirin EC 81 MG tablet Take 1 tablet (81 mg total) by mouth daily. 07/12/20  Yes Lorella Nimrod, MD  atorvastatin (LIPITOR) 40 MG tablet Take 40 mg by mouth at bedtime. 02/24/20  Yes [provider]  blood glucose meter kit and supplies Dispense based on Victor Hernandez and insurance preference. Use up to four times daily as directed. (FOR ICD-10 E10.9, E11.9). 06/02/20  Yes Jennye Boroughs, MD  dapagliflozin propanediol (FARXIGA) 10 MG TABS tablet Take 10 mg by mouth daily.   Yes Remi Haggard, FNP  Ferrous Sulfate (SLOW FE) 142 (45 Fe) MG TBCR Take 1 tablet by mouth daily.   Yes [provider]  hydrALAZINE (APRESOLINE) 25 MG tablet Take 1 tablet (25 mg total) by mouth 3 (three) times daily. 03/21/21 06/19/21 Yes Hackney, Otila Kluver A, FNP  insulin aspart (NOVOLOG) 100 UNIT/ML injection Inject into Victor skin. Sliding scale   Yes [provider]  metoprolol tartrate (LOPRESSOR) 50 MG tablet Take 1 tablet (50 mg  total) by mouth 2 (two) times daily. 03/21/21  Yes Hackney, Otila Kluver A, FNP  potassium chloride (KLOR-CON) 10 MEQ tablet Take 10 mEq by mouth daily.   Yes [provider]  torsemide (DEMADEX) 100 MG tablet Take 1 tablet (100 mg total) by mouth daily. Victor Hernandez taking differently: Take 50 mg by mouth daily. 07/13/20  Yes Lorella Nimrod, MD    Review of Systems  Constitutional:  Positive for fatigue (minimal). Negative for appetite change.  HENT:  Negative for congestion, rhinorrhea and sore throat.   Eyes: Negative.   Respiratory:  Negative for cough, chest tightness and shortness of breath.   Cardiovascular:  Negative for chest pain, palpitations and leg swelling.   Gastrointestinal:  Negative for abdominal distention and abdominal pain.  Endocrine: Negative.  Negative for polydipsia, polyphagia and polyuria.  Genitourinary: Negative.   Musculoskeletal:  Negative for back pain and neck pain.  Skin: Negative.   Allergic/Immunologic: Negative.   Neurological:  Negative for dizziness, syncope, light-headedness, numbness and headaches.  Hematological:  Negative for adenopathy. Does not bruise/bleed easily.  Psychiatric/Behavioral:  Positive for sleep disturbance (sleeping on 1-2 pillows; sometimes trouble sleeping). Negative for dysphoric mood. Victor Hernandez is not nervous/anxious.    Vitals:   07/14/21 0901  BP: (!) 160/106  Resp: 16  SpO2: 100%  Weight: 210 lb 2 oz (95.3 kg)  Height: _0  (1.676 m)   Wt Readings from Last 3 Encounters:  07/14/21 210 lb 2 oz (95.3 kg)  06/13/21 201 lb 4 oz (91.3 kg)  04/11/21 204 lb 8 oz (92.8 kg)   Lab Results  Component Value Date   CREATININE 3.02 (H) 03/21/2021   CREATININE 3.31 (H) 02/17/2021   CREATININE 2.52 (H) 07/12/2020   Physical Exam Vitals and nursing note reviewed.  Constitutional:      General: Victor Hernandez is not in acute distress.    Appearance: Normal appearance.  HENT:     Head: Normocephalic and atraumatic.  Cardiovascular:     Rate and Rhythm: Normal rate. Rhythm irregular.     Pulses: Normal pulses.     Heart sounds: Murmur heard.  Pulmonary:     Effort: Pulmonary effort is normal. No respiratory distress.     Breath sounds: Normal breath sounds. No wheezing or rales.  Abdominal:     General: There is no distension.     Palpations: Abdomen is soft.     Tenderness: There is no abdominal tenderness.  Musculoskeletal:        General: No tenderness.     Cervical back: Normal range of motion and neck supple.     Right lower leg: Edema (+1) present.     Left lower leg: Edema (+1) present.  Skin:    General: Skin is warm and dry.  Neurological:     General: No focal deficit present.      Mental Status: Victor Hernandez is alert and oriented to person, place, and time.  Psychiatric:        Mood and Affect: Mood normal.        Behavior: Behavior normal.        Thought Content: Thought content normal.   Assessment & Plan:  1: Chronic heart failure with preserved ejection fraction with structural changes (LVH/ LAE)- - NYHA class II - euvolemic today - weighing daily; reminded to call for an overnight weight gain of > 2 pounds or a weekly weight gain of > 5 pounds - weight 210 today, up 9 lbs from last visit, appears  to be related to increased snacking - not adding salt and is trying to be mindful of sodium content of foods - pharmD reconciled medications and counseled on dietary changes - takes lopressor and farxiga - not on MRA or entresto, last GFR 21 - BNP 07/05/20 was 866.4  2: HTN- - BP 160/106, re-checked 148/84 - Victor Hernandez states it is always elevated in spite of various treatment modalities - continues to not be able to adhere to taking hydralazine TID - change hydralazine from 25 TID to 50 BID - written instructions provided on dose change as well as on Victor actual bottle - BMP 03/14/21 (from PCP) reviewed and showed sodium 135, potassium 4.6, creatinine 3.1 and GFR 21 - check BMP today  3: DM with CKD- - sporadically checks Victor Hernandez BS - cannot recall last time Victor Hernandez checked it but said it was "in Victor 200's" - pharmD reviewed dietary modifications and provided written material - takes novolog 30 units once/day - educated on Victor deleterious effects of unmanaged DM on Victor Hernandez heart failure and CKD  - A1c 03/14/21 from PCP was 11.6% - referral sent for nephrology appointment at last visit but Victor Hernandez states Victor Hernandez has not heard from them, pt works 6 days/week doing outside physical labor which makes it hard for Victor Hernandez to answer Victor Hernandez phone calls for new appts etc.  - on Kerendia per PCP  Medication bottles reviewed.   Return in 1 month or sooner for any questions/problems before then.

## 2021-07-19 IMAGING — MR MRI BRAIN W/WO CONTRAST
14 series · 48 of 48 positions shown · IV contrast (15cc PROHANCE)
Comparison: None.

HISTORY: 65-year-old male with seizures. Patient reports episodes of disorientation and severe headaches, possible seizures. Patient reports history of multiple head injuries.
TECHNIQUE: Multiplanar, multisequence MRI images of the brain are obtained prior to and following intravenous contrast.

CONTRAST: 15 mL of ProHance

[Series 2: bSSFP · axial · 10.0mm · 1.17mm/px · 1 of 15 slices shown]
[im 1/15]
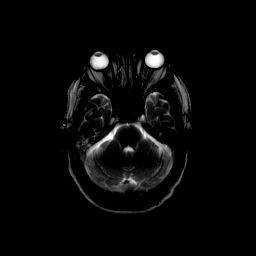

[Series 3: mprage_pre · axial · 1.0mm · 0.94mm/px · z∈[-63,+86]mm · 6 of 160 slices shown]
[im 1/160]
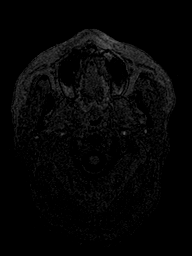
[im 32/160]
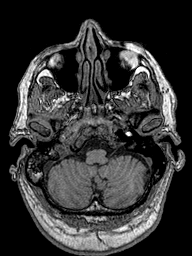
[im 64/160]
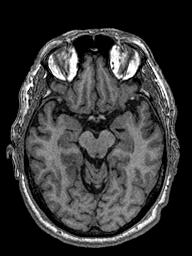
[im 96/160]
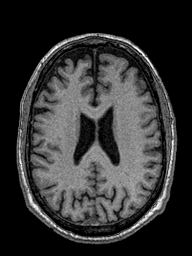
[im 128/160]
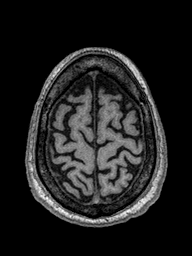
[im 160/160]
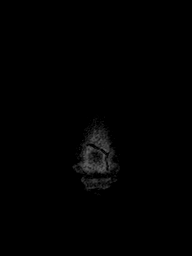

[Series 4: flair_axial_fs · axial · 5.0mm · 0.94mm/px · 1 of 26 slices shown]
[im 1/26]
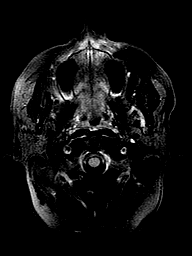

[Series 5: mprage_sag_recon · sagittal · 1.0mm · 0.94mm/px · 6 of 153 slices shown]
[im 1/153]
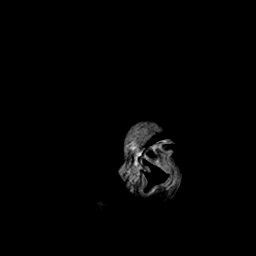
[im 31/153]
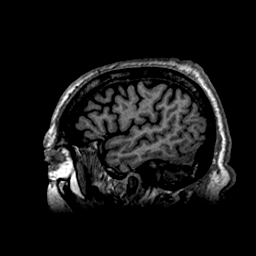
[im 61/153]
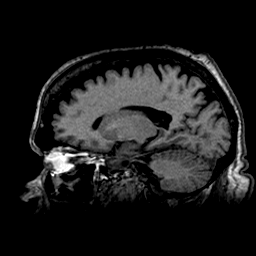
[im 92/153]
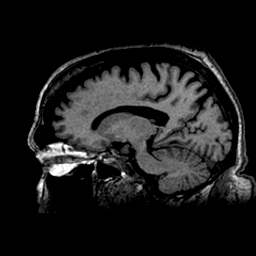
[im 122/153]
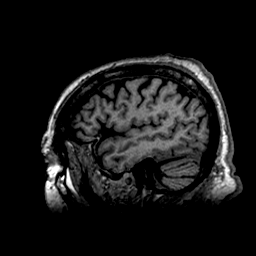
[im 153/153]
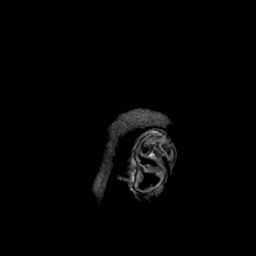

[Series 6: t2_axial · axial · 5.0mm · 0.47mm/px · 1 of 26 slices shown]
[im 1/26]
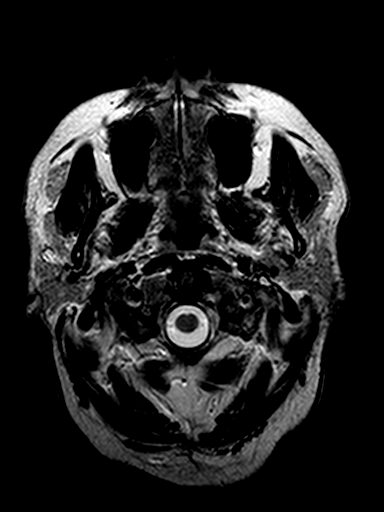

[Series 6: mprage_cor_obl_recon · coronal · 1.0mm · 0.84mm/px · 8 of 194 slices shown]
[im 1/194]
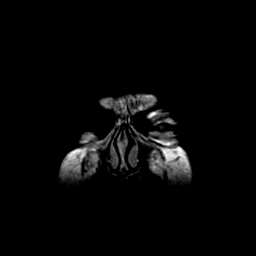
[im 28/194]
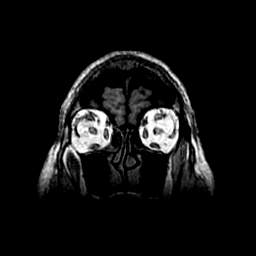
[im 56/194]
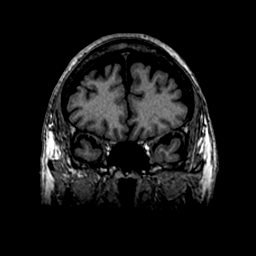
[im 83/194]
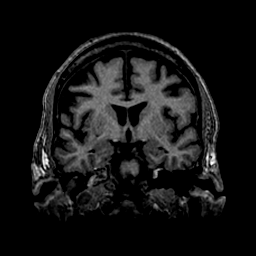
[im 111/194]
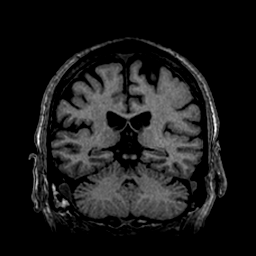
[im 138/194]
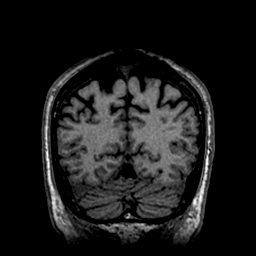
[im 166/194]
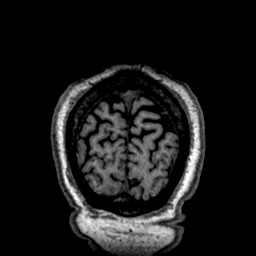
[im 194/194]
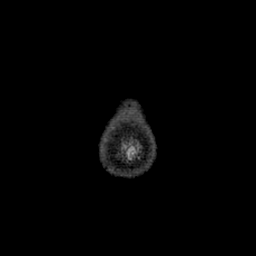

[Series 8: DWI · axial · 5.0mm · 0.94mm/px · 1 of 25 slices shown (1 of 2)]
[im 1/25]
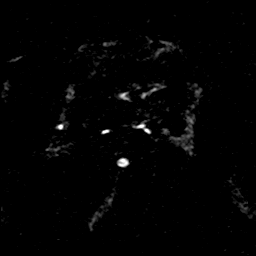

[Series 10: flash_axial · axial · 5.0mm · 0.47mm/px · 1 of 26 slices shown]
[im 1/26]
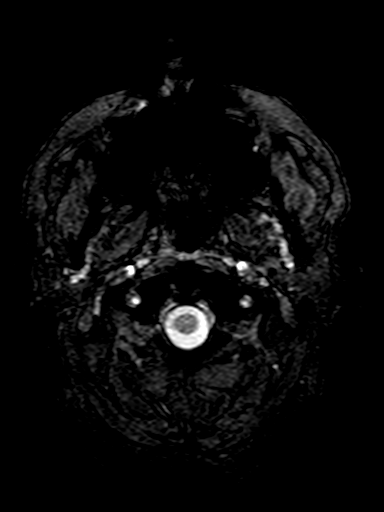

[Series 12: DWI · axial · 5.0mm · 0.94mm/px · 1 of 26 slices shown (2 of 2)]
[im 1/26]
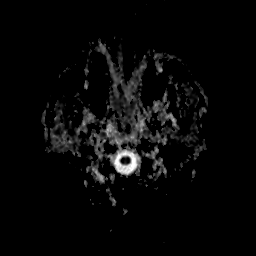

[Series 13: flair_cor_obl_fs · coronal · 5.0mm · 0.94mm/px · 1 of 28 slices shown]
[im 1/28]
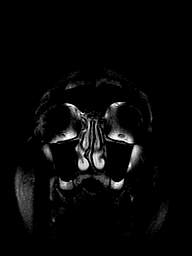

[Series 14: t2_cor_obl · coronal · 5.0mm · 0.47mm/px · 1 of 28 slices shown]
[im 1/28]
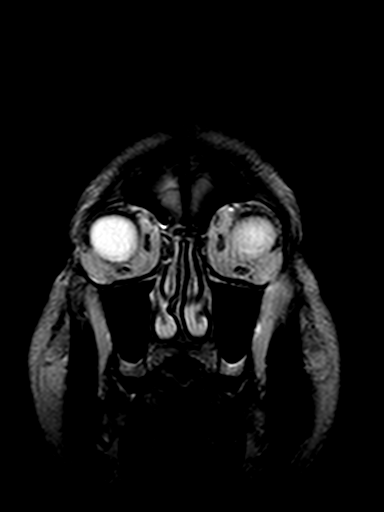

[Series 15: T1 · axial · 1.0mm · 0.94mm/px · z∈[-63,+86]mm · 6 of 160 slices shown]
[im 1/160]
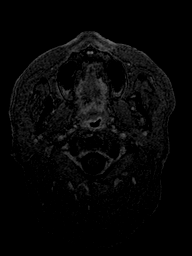
[im 32/160]
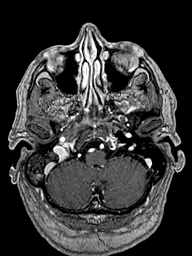
[im 64/160]
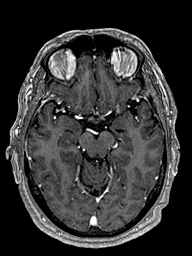
[im 96/160]
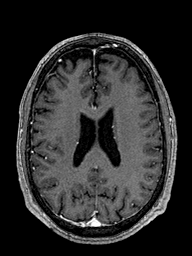
[im 128/160]
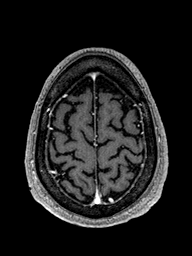
[im 160/160]
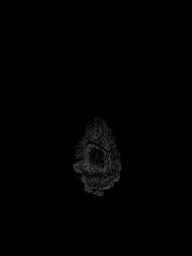

[Series 16: mprage_sag_recon_+c · sagittal · 1.0mm · 0.94mm/px · 6 of 154 slices shown]
[im 1/154]
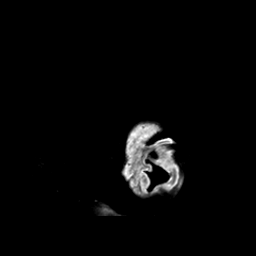
[im 31/154]
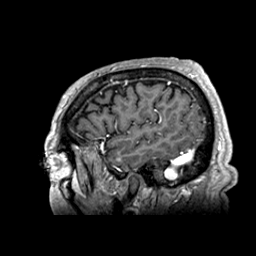
[im 62/154]
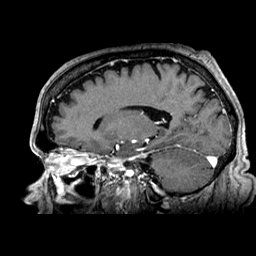
[im 92/154]
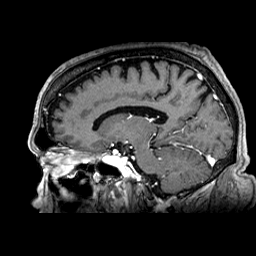
[im 123/154]
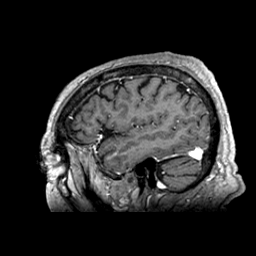
[im 154/154]
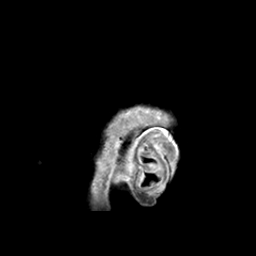

[Series 17: mprage_cor_obl_recon_+c · coronal · 1.0mm · 0.94mm/px · 8 of 201 slices shown]
[im 1/201]
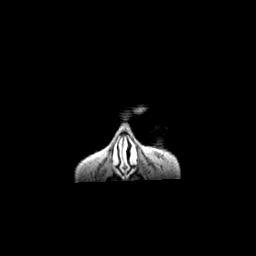
[im 29/201]
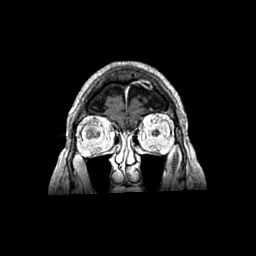
[im 58/201]
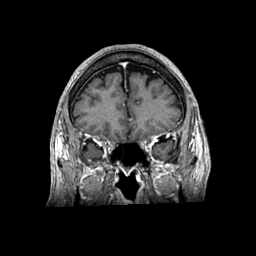
[im 86/201]
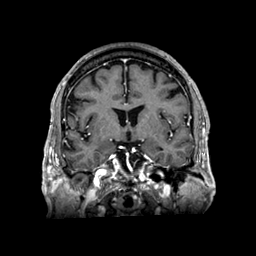
[im 115/201]
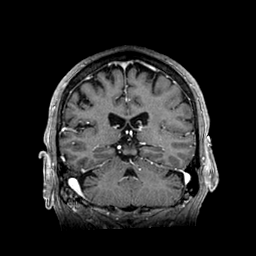
[im 143/201]
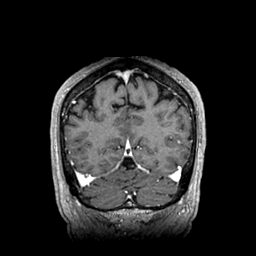
[im 172/201]
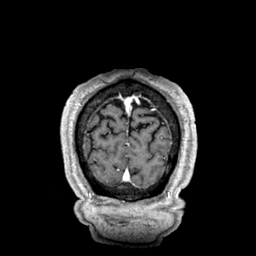
[im 201/201]
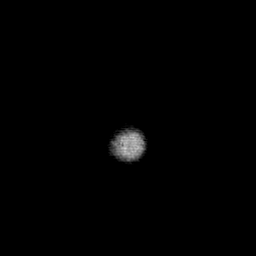

[48 of 48 positions shown; findings below may reference images not displayed]

FINDINGS: BRAIN PARENCHYMA: No acute infarct. Mild supratentorial white matter T2/FLAIR hyperintensities are nonspecific but statistically most likely to be related to chronic microangiopathic/senescent changes. No abnormal intracranial enhancement.

PITUITARY: Unremarkable.

VASCULATURE: Expected major intracranial flow voids are present.

VENTRICULAR SYSTEM: No hydrocephalus or midline shift.

EXTRA-AXIAL SPACES: No intra- or extra-axial fluid collections.

ORBITS: Unremarkable.

PARANASAL SINUSES AND MASTOID AIR CELLS: Nonspecific right mastoid air cell effusion.

BONES: Unremarkable.
IMPRESSION: No acute intracranial abnormality or visualized etiology for seizure.

## 2021-07-20 ENCOUNTER — Telehealth: Payer: Self-pay | Admitting: Family

## 2021-07-20 NOTE — Telephone Encounter (Signed)
Notified patient that perscription for Wilder Glade has been shipped out from [place] thru patient assistance.    Vance Belcourt, NT

## 2021-08-10 NOTE — Progress Notes (Deleted)
? Patient ID: Victor Hernandez, male    DOB: 1955/11/26, 66 y.o.   MRN: [id number] ? ? ?Victor Hernandez is a 66 y/o male with a history of DM, hyperlipidemia, HTN, CKD, previous tobacco use and chronic heart failure.  ? ?Echo report from 06/01/20 reviewed and showed an EF of 50-55% along with moderate LVH/ LAE and moderate/ severe Victor.  ? ?Has not been admitted or been in the ED in the last 6 months.  ? ?He presents today for a follow-up visit with a chief complaint of  ? ?Past Medical History:  ?Diagnosis Date  ? CHF (congestive heart failure) ([place])   ? Chronic kidney disease   ? Diabetes mellitus without complication ([place])   ? Hyperlipidemia   ? Hypertension   ? ?Past Surgical History:  ?Procedure Laterality Date  ? COLONOSCOPY    ? COLONOSCOPY WITH PROPOFOL N/A 05/19/2016  ? Procedure: COLONOSCOPY WITH PROPOFOL;  Surgeon: Victor Lye, MD;  Location: [facility] ENDOSCOPY;  Service: Endoscopy;  Laterality: N/A;  ? ?No family history on file. ?Social History  ? ?Tobacco Use  ? Smoking status: Former  ?  Types: Cigarettes  ?  Quit date: 73  ?  Years since quitting: 2.2  ? Smokeless tobacco: Never  ?Substance Use Topics  ? Alcohol use: No  ?  Comment: "a beer once in awhile"  ? ?No Known Allergies ? ? ? ?Review of Systems  ?Constitutional:  Positive for fatigue (minimal). Negative for appetite change.  ?HENT:  Negative for congestion, rhinorrhea and sore throat.   ?Eyes: Negative.   ?Respiratory:  Negative for cough, chest tightness and shortness of breath.   ?Cardiovascular:  Negative for chest pain, palpitations and leg swelling.  ?Gastrointestinal:  Negative for abdominal distention and abdominal pain.  ?Endocrine: Negative.  Negative for polydipsia, polyphagia and polyuria.  ?Genitourinary: Negative.   ?Musculoskeletal:  Negative for back pain and neck pain.  ?Skin: Negative.   ?Allergic/Immunologic: Negative.   ?Neurological:  Negative for dizziness, syncope, light-headedness, numbness and headaches.  ?Hematological:   Negative for adenopathy. Does not bruise/bleed easily.  ?Psychiatric/Behavioral:  Positive for sleep disturbance (sleeping on 1-2 pillows; sometimes trouble sleeping). Negative for dysphoric mood. The patient is not nervous/anxious.   ? ? ?Physical Exam ?Vitals and nursing note reviewed.  ?Constitutional:   ?   General: He is not in acute distress. ?   Appearance: Normal appearance.  ?HENT:  ?   Head: Normocephalic and atraumatic.  ?Cardiovascular:  ?   Rate and Rhythm: Normal rate. Rhythm irregular.  ?   Pulses: Normal pulses.  ?   Heart sounds: Murmur heard.  ?Pulmonary:  ?   Effort: Pulmonary effort is normal. No respiratory distress.  ?   Breath sounds: Normal breath sounds. No wheezing or rales.  ?Abdominal:  ?   General: There is no distension.  ?   Palpations: Abdomen is soft.  ?   Tenderness: There is no abdominal tenderness.  ?Musculoskeletal:     ?   General: No tenderness.  ?   Cervical back: Normal range of motion and neck supple.  ?   Right lower leg: Edema (+1) present.  ?   Left lower leg: Edema (+1) present.  ?Skin: ?   General: Skin is warm and dry.  ?Neurological:  ?   General: No focal deficit present.  ?   Mental Status: He is alert and oriented to person, place, and time.  ?Psychiatric:     ?   Mood  and Affect: Mood normal.     ?   Behavior: Behavior normal.     ?   Thought Content: Thought content normal.  ? ?Assessment & Plan: ? ?1: Chronic heart failure with preserved ejection fraction with structural changes (LVH/ LAE)- ?- NYHA class II ?- euvolemic today ?- weighing daily; reminded to call for an overnight weight gain of > 2 pounds or a weekly weight gain of > 5 pounds ?- weight 210.2 from last visit here 1 month ago ?- not adding salt and is trying to be mindful of sodium content of foods ?- takes lopressor and farxiga ?- not on MRA or entresto, last GFR 21 ?- BNP 07/05/20 was 866.4 ?- pharmD reconciled medications  ? ?2: HTN- ?- BP  ?- hydralazine increased to '50mg'$  BID at last visit ?- BMP  07/14/21 reviewed and showed sodium 139, potassium 3.8, creatinine 3.97 and GFR 16 ? ?3: DM with CKD- ?- sporadically checks his BS ?- cannot recall last time he checked it  ?- A1c 03/14/21 from PCP was 11.6% ?- saw nephrology Candiss Norse) 07/19/21; returns ~ 6 weeks ?- on Kerendia per PCP ? ? ?Medication bottles reviewed.  ? ? ? ? ? ?

## 2021-08-11 ENCOUNTER — Ambulatory Visit: Payer: Medicare HMO | Admitting: Family

## 2021-09-08 ENCOUNTER — Other Ambulatory Visit: Payer: Self-pay | Admitting: Family

## 2021-09-08 MED ORDER — DAPAGLIFLOZIN PROPANEDIOL 10 MG PO TABS: 10.0000 mg | ORAL_TABLET | Freq: Every day | ORAL | 3 refills | Status: DC

## 2021-09-08 NOTE — Progress Notes (Signed)
Farxiga prescription printed to send for patient assistance ?

## 2021-11-10 ENCOUNTER — Telehealth: Payer: Self-pay | Admitting: Family

## 2021-11-10 NOTE — Telephone Encounter (Signed)
Notified patient that his perscription for Wilder Glade has been shipped per [place] and [place] through the patient assistance program.   Luetta Nutting, [place]

## 2021-11-17 IMAGING — CT CT ABD/PEL WO/W CONT HEMATURIA PROTOCOL
2 of 6 series · 13 of 46 positions shown, 15 images · IV contrast (isovue)
Comparison: None

Images Obtained from [facility]
HISTORY: Hematuria.
TECHNIQUE: Axial images of the abdomen and pelvis are obtained from the hemidiaphragms to the pubic symphysis prior to and following 125 mL Isovue-61B intravenous contrast.  Serum creatinine
mg/dL. Dose reduction technique used: Automated exposure control and adjustment of the mA and/or kV according to patient size.  CT Studies and Cardiac Nuclear Medicine Studies in last 12-months = 0

[Series 2: pre contrast · axial · non-contrast · 0.59mm/px · z∈[+1209,+1596]mm · 10 of 159 slices shown, 12 images]
[im 15/159  soft-tissue]
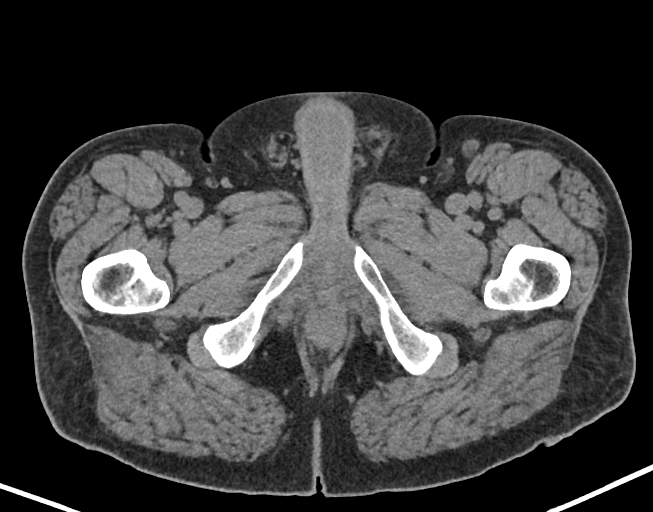
[im 15/159  bone]
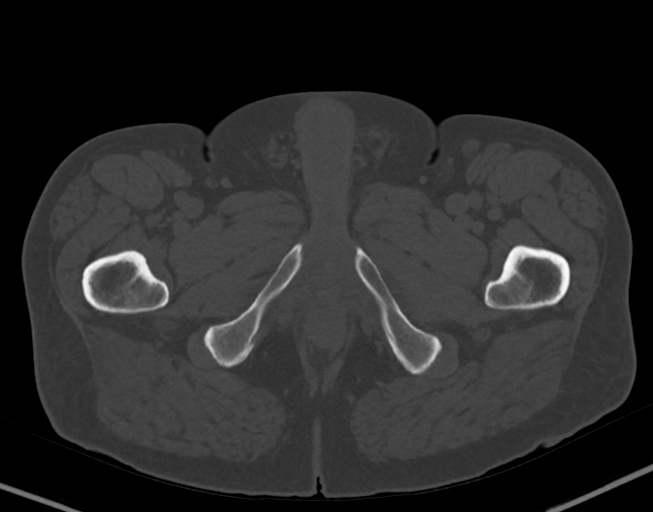
[im 29/159  soft-tissue]
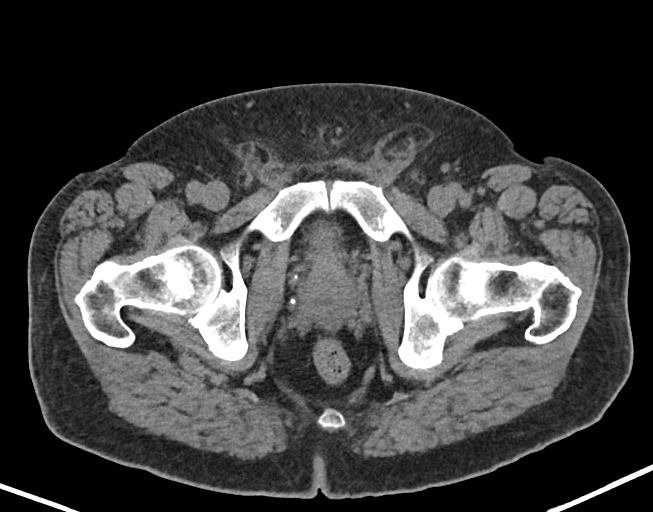
[im 44/159  soft-tissue]
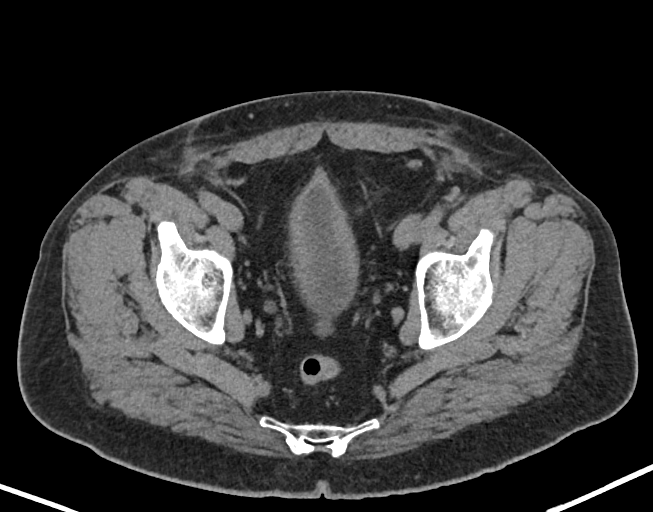
[im 58/159  soft-tissue]
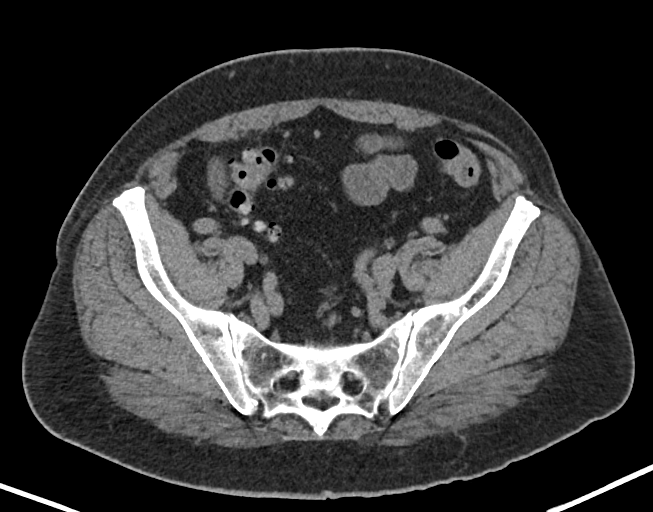
[im 72/159  soft-tissue]
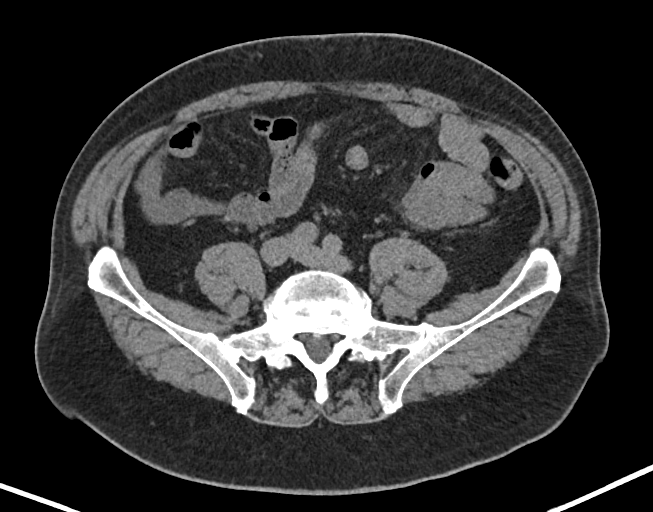
[im 87/159  soft-tissue]
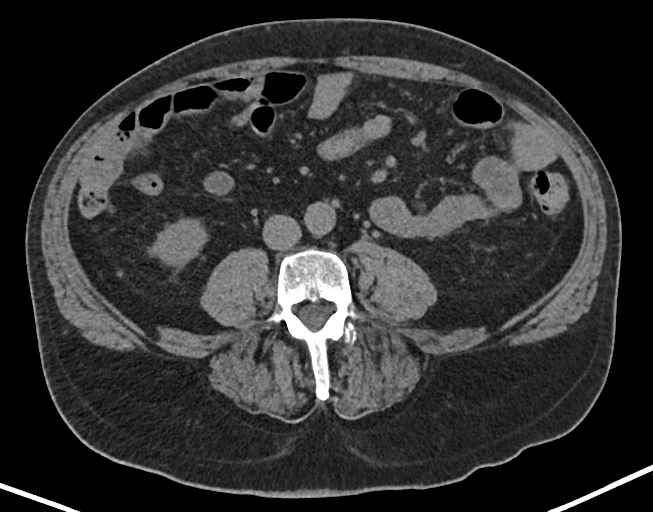
[im 101/159  soft-tissue]
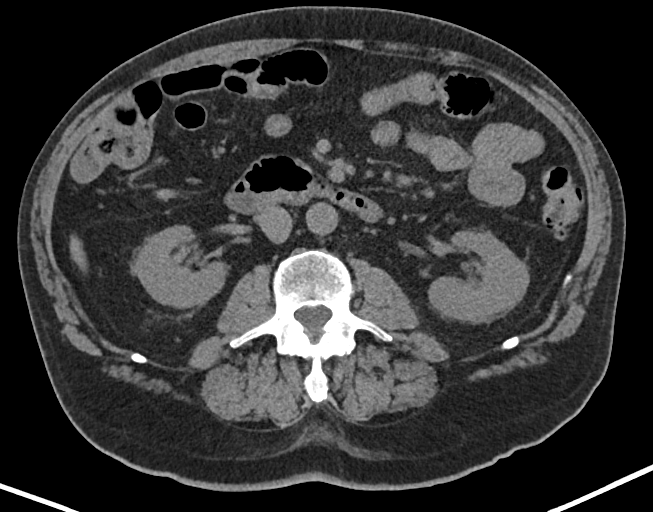
[im 115/159  soft-tissue]
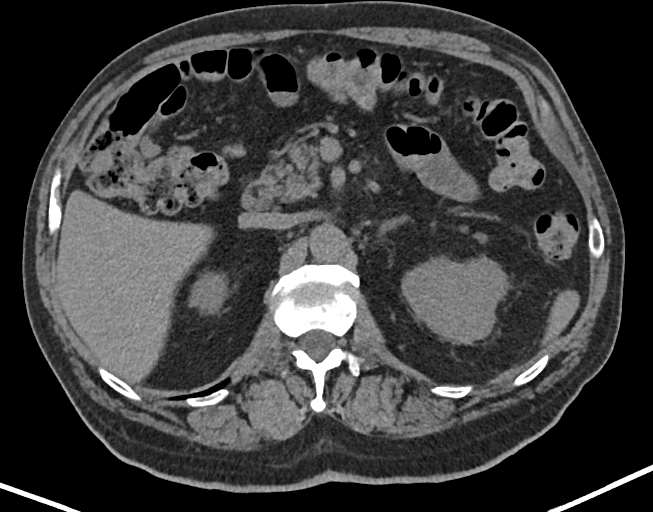
[im 130/159  soft-tissue]
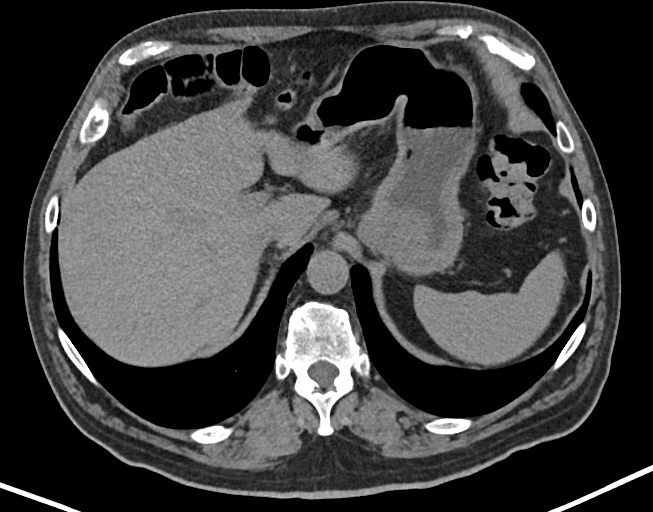
[im 130/159  bone]
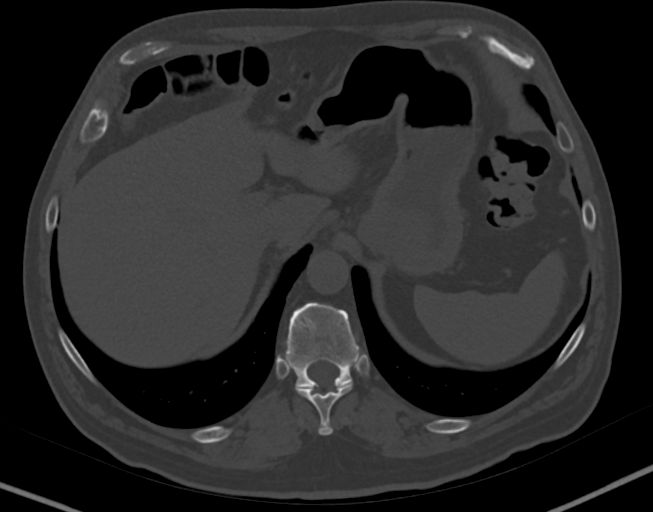
[im 144/159  soft-tissue]
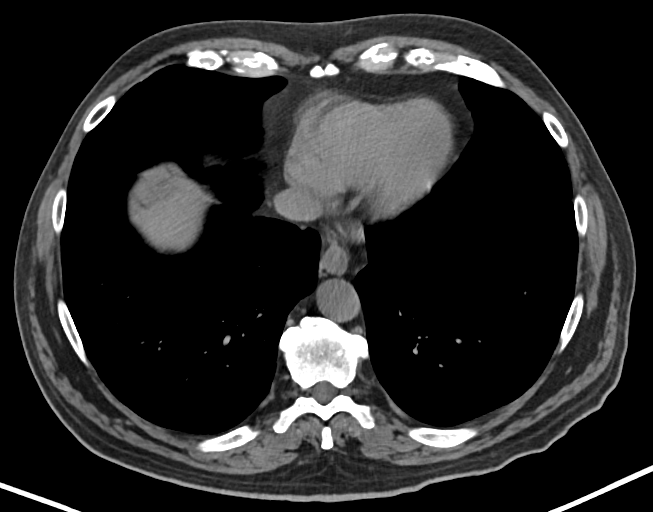

[Series 10: coronal · coronal · 0.73mm/px · 3 of 98 slices shown]
[im 33/98  soft-tissue]
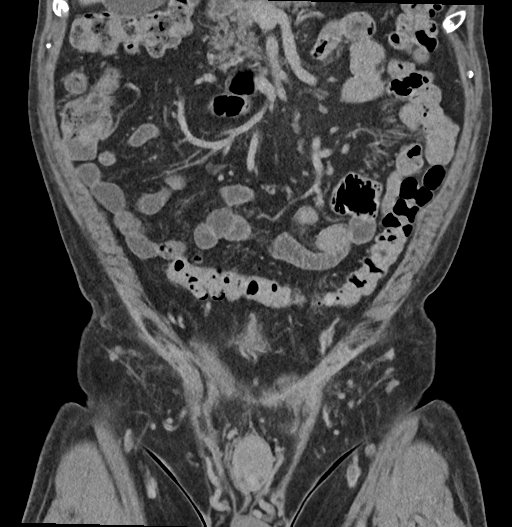
[im 44/98  soft-tissue]
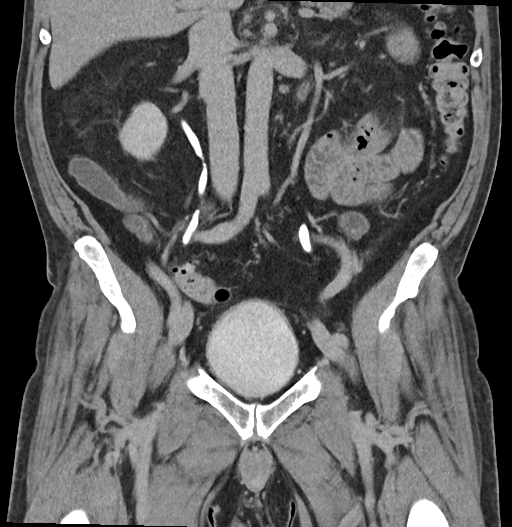
[im 54/98  soft-tissue]
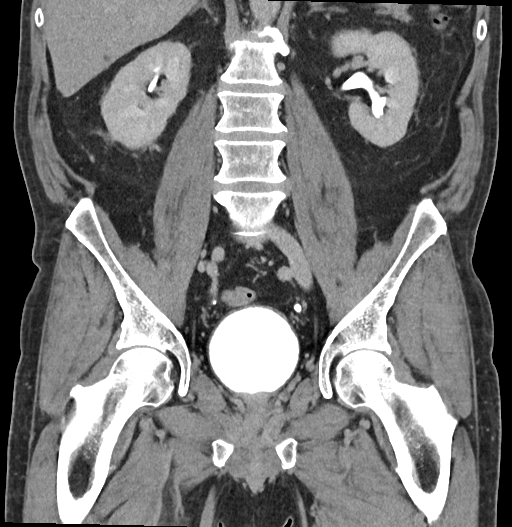

[13 of 46 positions shown; findings below may reference images not displayed]

FINDINGS: Pre-IV contrast images show a 7 mm calcification in the upper left renal collecting system and a comma-shaped calcification measuring 6 mm in the inferior collecting system. Diverticula
are scattered about the sigmoid colon. In the proximal left ureter there is a 4.5 mm calcification. Postcontrast images show clear lung bases. The heart size is normal.
The liver and spleen enhance homogeneously without suspicious abnormality. A simple cyst is seen in the mid right liver. Gallbladder, pancreas and adrenals are normal. A duodenal diverticulum
impresses upon the uncinate process of the pancreas.
The kidneys uptake contrast without hydronephrosis or suspicious parenchymal abnormality. There is symmetric excretion of contrast into nondistorted collecting systems. The ureters have a normal
course and caliber to the urinary bladder which is well-distended and grossly normal.
The GI tract is not obstructed. Numerous diverticula are scattered about the distal colon. There is no focal fat stranding. There is no free fluid or extraluminal gas.
No adenopathy is identified.
Musculoskeletal system shows degenerative disc disease and facet arthropathy at L5-S1 which yields bilateral neural foraminal stenosis.
IMPRESSION: 1.  Left nephrolithiasis and 4.5 mm proximal left ureteral stone without significant obstruction.
2.  Diverticulosis.
3.  Degenerative disc disease L5-S1 yielding bilateral neural foraminal stenosis.
Total radiation dose to patient is CTDIvol 31.30 mGy and DLP 1230.00 mGy-cm.

## 2021-11-22 IMAGING — MR MRI PROSTATE WO/W CONTRAST
9 series · 48 of 48 positions shown · IV contrast (20CC PROHANCE)
Comparison: none

[Series 2: haste_3 plane loc · axial · 6.0mm · 1.17mm/px · 1 of 15 slices shown]
[im 1/15]
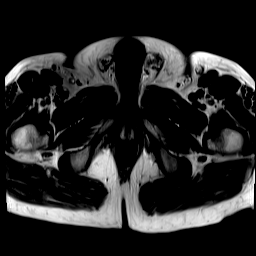

[Series 3: t1_axial_tse · axial · 6.0mm · 0.99mm/px · 1 of 34 slices shown]
[im 1/34]
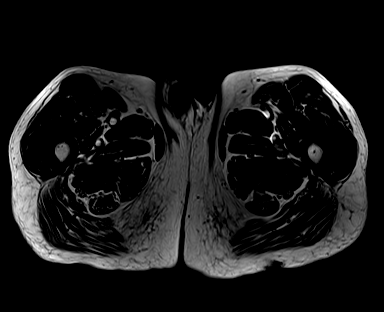

[Series 4: t2_tse_blade_sag · sagittal · 3.0mm · 0.66mm/px · 1 of 25 slices shown]
[im 1/25]
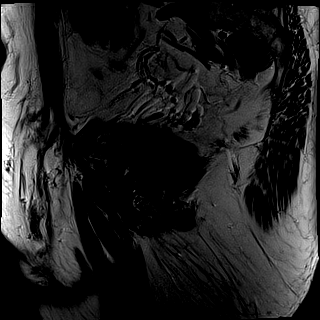

[Series 5: t2_(person_name)_(person_name) · axial · 3.0mm · 0.56mm/px · 1 of 25 slices shown]
[im 1/25]
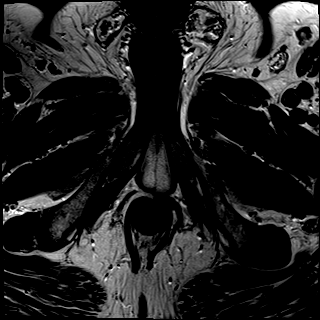

[Series 6: t2_tse_cor · coronal · 3.0mm · 0.62mm/px · 1 of 20 slices shown]
[im 1/20]
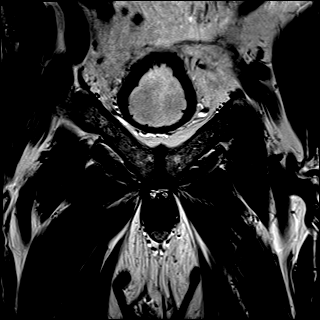

[Series 7: resolve_diff_b50_800_(person_name)_(person_name)2_tracew · axial · 3.5mm · 1.43mm/px · 1 of 44 slices shown]
[im 1/44]
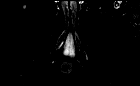

[Series 8: resolve_diff_b50_800_(person_name)_(person_name)2_adc · axial · 3.5mm · 1.43mm/px · 1 of 22 slices shown]
[im 1/22]
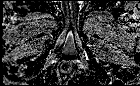

[Series 9: resolve_diff_b50_800_(person_name)_(person_name)2_calc_bval · axial · 3.5mm · 1.43mm/px · 1 of 22 slices shown]
[im 1/22]
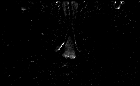

[Series 10: t1_twist_(person_name)_dyn · axial · 3.5mm · 1.15mm/px · z∈[+0,+74]mm · 40 of 1056 slices shown]
[im 1/1056]
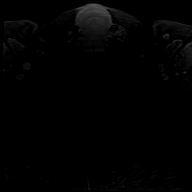
[im 28/1056]
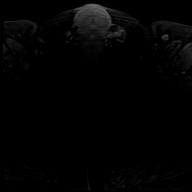
[im 55/1056]
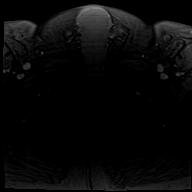
[im 82/1056]
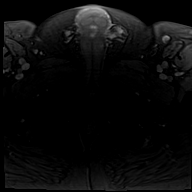
[im 109/1056]
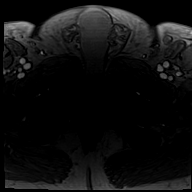
[im 136/1056]
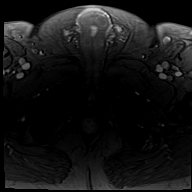
[im 163/1056]
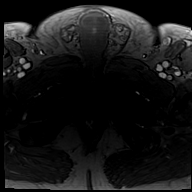
[im 190/1056]
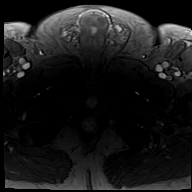
[im 217/1056]
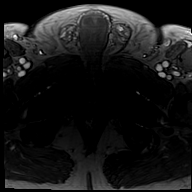
[im 244/1056]
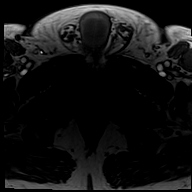
[im 271/1056]
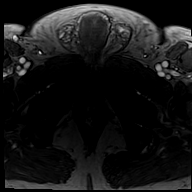
[im 298/1056]
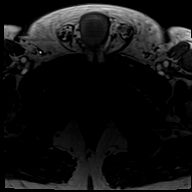
[im 325/1056]
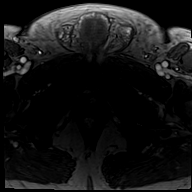
[im 352/1056]
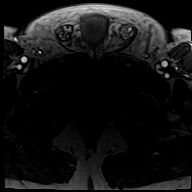
[im 379/1056]
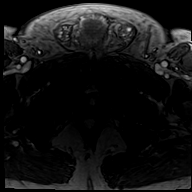
[im 406/1056]
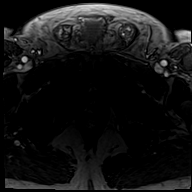
[im 433/1056]
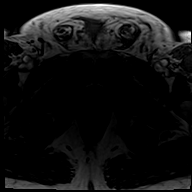
[im 460/1056]
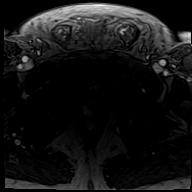
[im 487/1056]
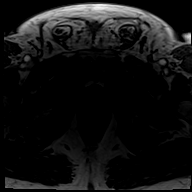
[im 514/1056]
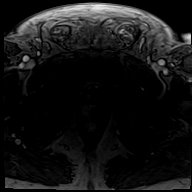
[im 542/1056]
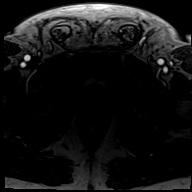
[im 569/1056]
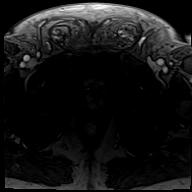
[im 596/1056]
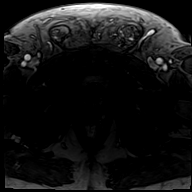
[im 623/1056]
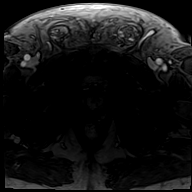
[im 650/1056]
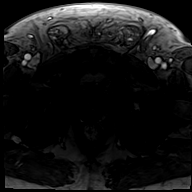
[im 677/1056]
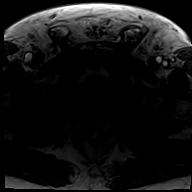
[im 704/1056]
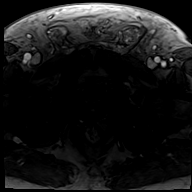
[im 731/1056]
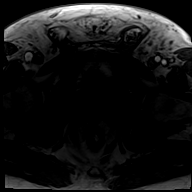
[im 758/1056]
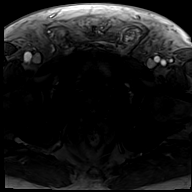
[im 785/1056]
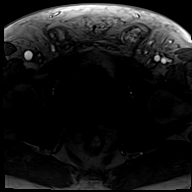
[im 812/1056]
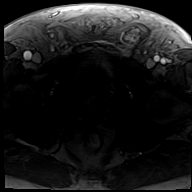
[im 839/1056]
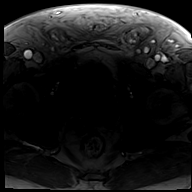
[im 866/1056]
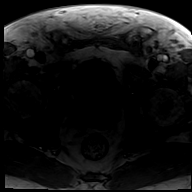
[im 893/1056]
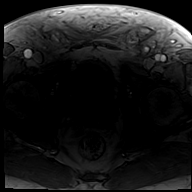
[im 920/1056]
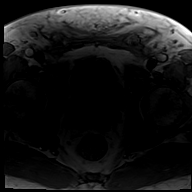
[im 947/1056]
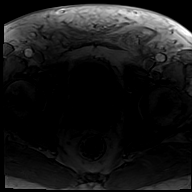
[im 974/1056]
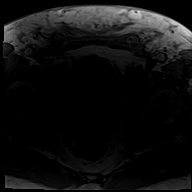
[im 1001/1056]
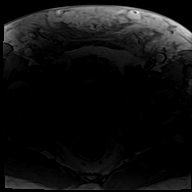
[im 1028/1056]
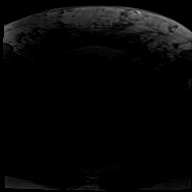
[im 1056/1056]
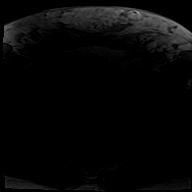

[48 of 48 positions shown; findings below may reference images not displayed]

Images Obtained from [facility]
REASON FOR EXAM
*  Nodular prostate without lower urinary tract symptoms
COMPARISON
*  None
TECHNIQUE
*  MR images of the pelvis were acquired with a 3T scanner before and after the administration of 20 cc ProHance IV contrast
PI-RADS Assessment Categories
*  PI-RADS 1 - Very low (clinically significant cancer is highly unlikely to be present)
*  PI-RADS 2 - Low (clinically significant cancer is unlikely to be present)
*  PI-RADS 3 - Intermediate (the presence of clinically significant cancer is equivocal)
*  PI-RADS 4 - High (clinically significant cancer is likely to be present)
*  PI-RADS 5 - Very high (clinically significant cancer is highly likely to be present)
FINDINGS
Prostate Dimensions
*  34 x 37 x 38 mm
*  Volume 27 mL
PSA Density
*  0.16 ng/mL/mL
*  (Based on PSA 4.4 ng/mL)
Post-Interventional Signal
*  None
Peripheral Zone
*  Normal volume
*  Minimal, patchy fibrosis
Prostate Lesion #1
*  Apex, peripheral zone, right, posterolateral     ([DATE])
*  14 x 13 x 13 mm
*  Center lies 1.5 cm from the rectal mucosa
*  T2 hypointense with mildly irregular margins  Score [DATE]
*  Hyperintense on DWI and hypointense on ADC  Score [DATE]
*  Positive DCE
*  2 mm extracapsular extension--posterolateral ([DATE])
*  Overall category [DATE]
Prostate Lesion #2
*  Mid gland, peripheral zone, left, posterolateral, subcapsular     ([DATE])
*  8 x 4 x 5 mm
*  Center lies 1.5 cm from the rectal mucosa
*  T2 hypointense, wedge-shaped  Score [DATE]
*  Minimally hyperintense on DWI and mildly hypointense on ADC  Score [DATE]
*  Positive DCE
*  Negative for extracapsular extension
*  Overall category [DATE]
Transition Zone
*  Minimal BPH
Neurovascular Bundles
*  Right neurovascular bundle is difficult to separate from tumor at the right apex
*  Negative for involvement of the left neurovascular bundle
Seminal Vesicles
*  Negative
Lymph Nodes
*  No suspicious lymph nodes
Bones
*  Unremarkable
Pelvic Viscera
*  Inguinal hernia repair
*  Colonic diverticulosis
IMPRESSION
*  Right apical lesion with 2 mm extraprostatic extension (correlates with the palpable finding). Tumor is difficult to separate from the right neurovascular bundle  (PI-RADS 5)
*  Left mid gland lesion without extraprostatic extension. (PI-RADS 4)
*  No enlarged pelvic lymph nodes

## 2021-12-20 IMAGING — CR IVP
1 series · 13 of 15 positions shown · non-contrast
Comparison: 11/17/21 CT
PROCEDURE: 50 cc Isovue 300 IV after i-STAT result [DATE] was sufficient for contrast administration.

Images Obtained from [facility]
INDICATION: Calculus of ureter

[Series 1: scout · 0.17mm/px · 13 of 15 slices shown]
[im 1/15]
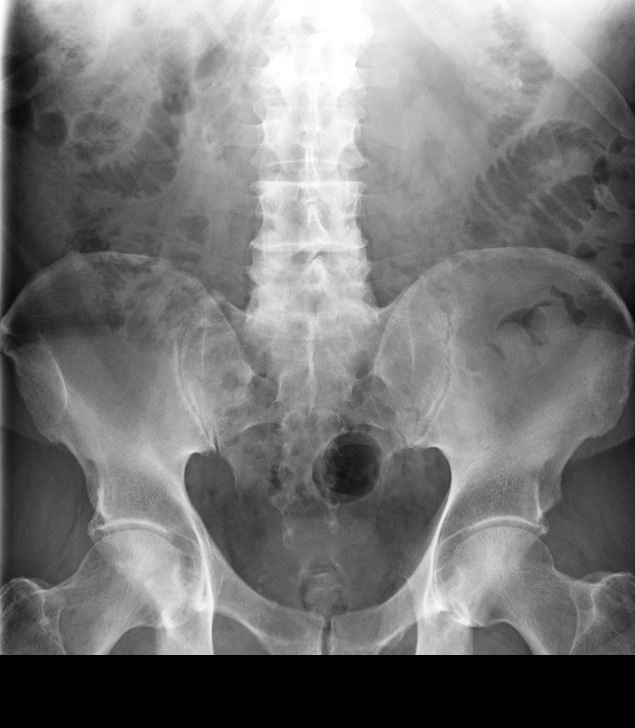
[im 2/15]
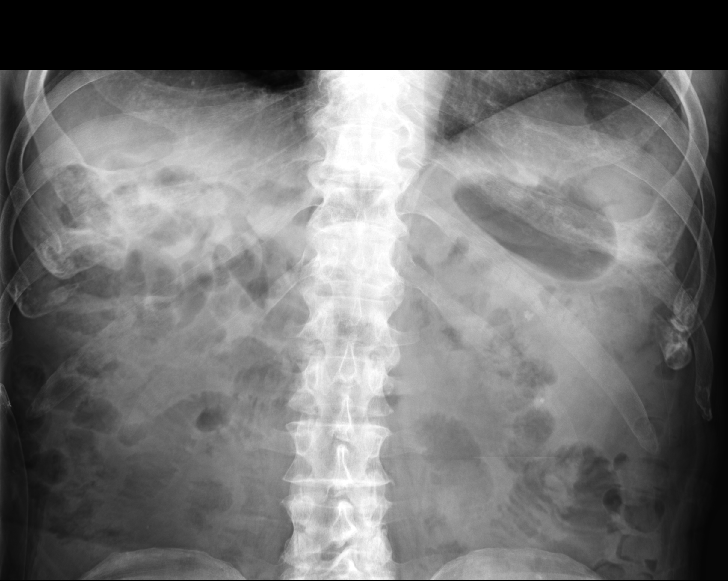
[im 3/15]
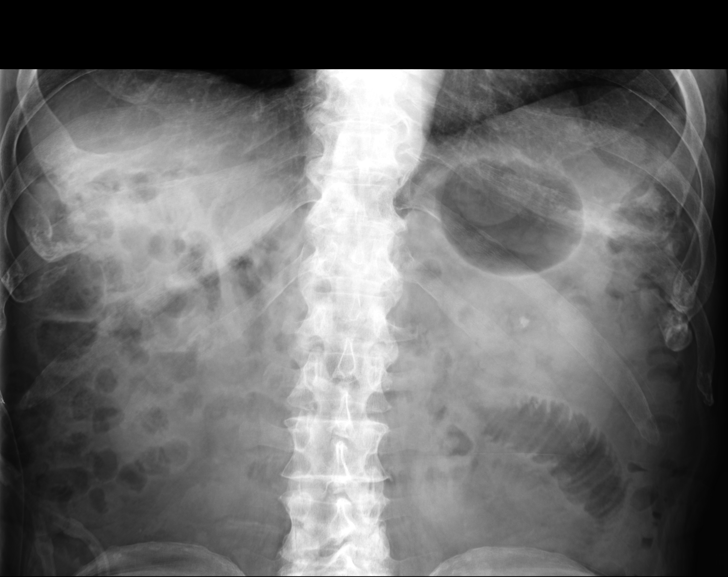
[im 5/15]
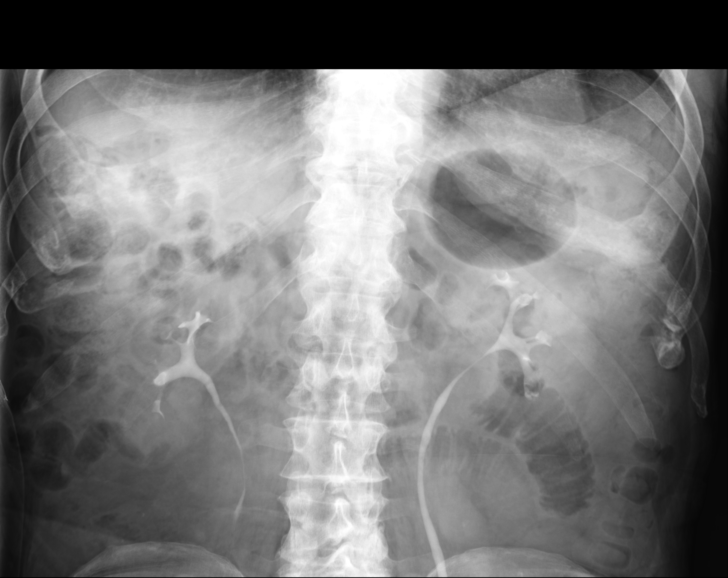
[im 6/15]
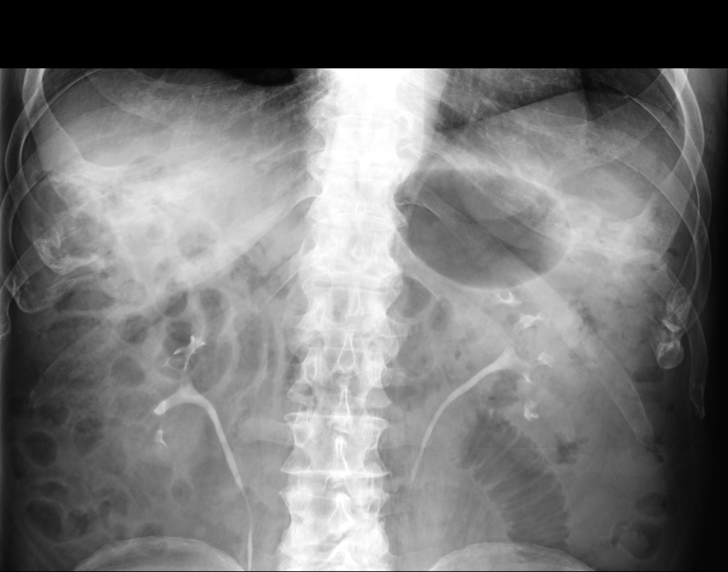
[im 7/15]
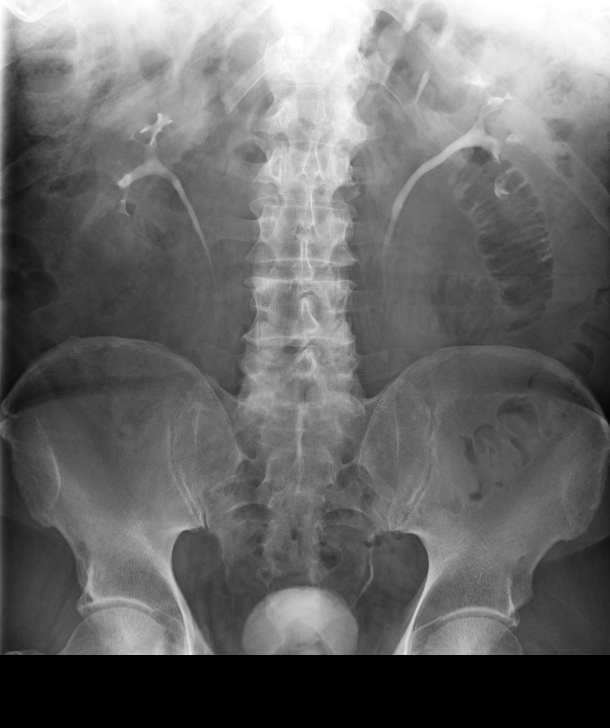
[im 8/15]
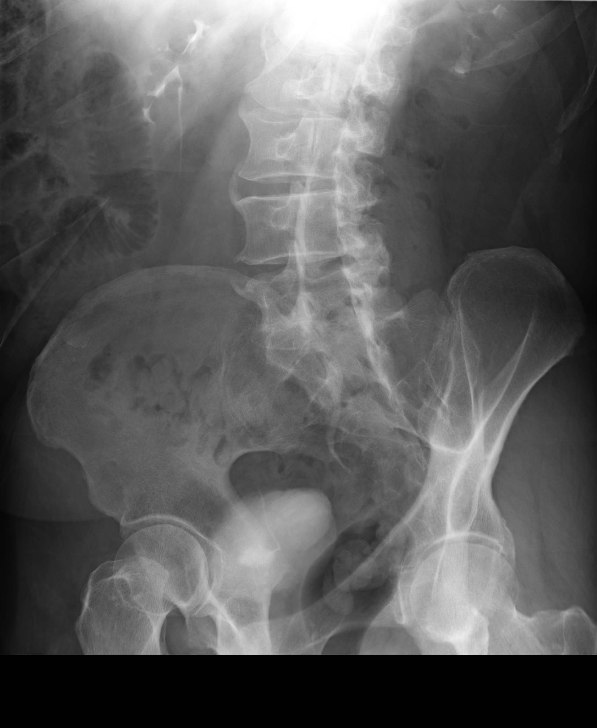
[im 9/15]
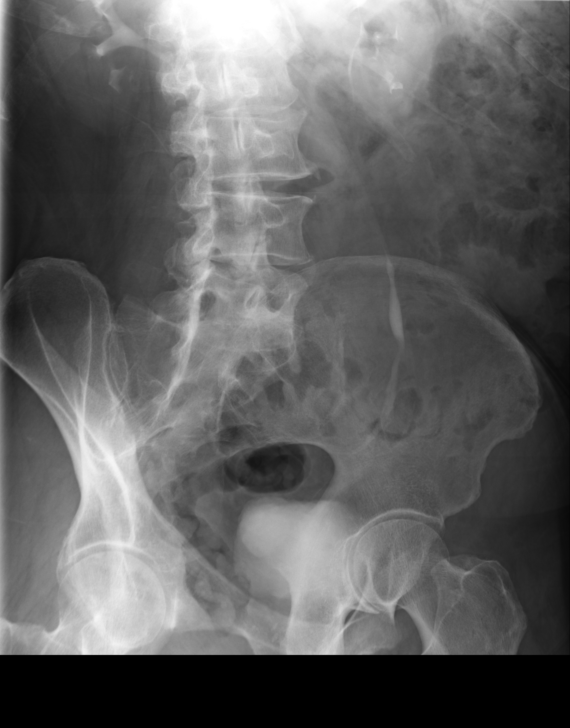
[im 10/15]
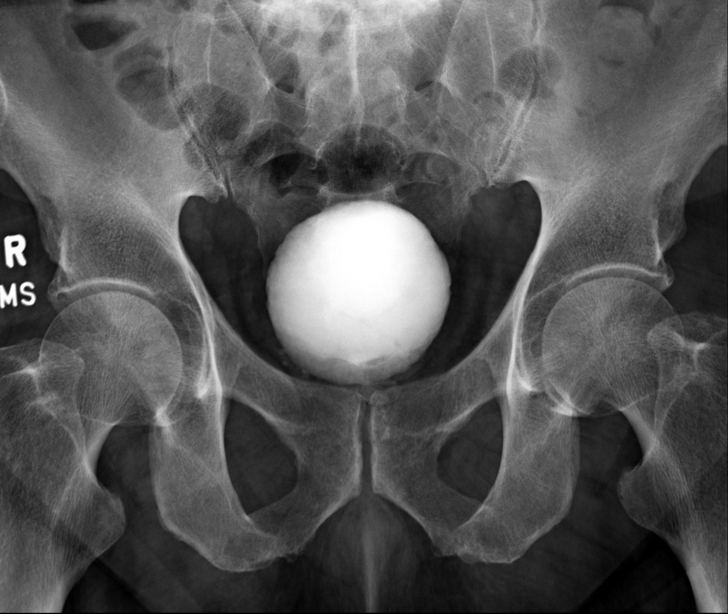
[im 11/15]
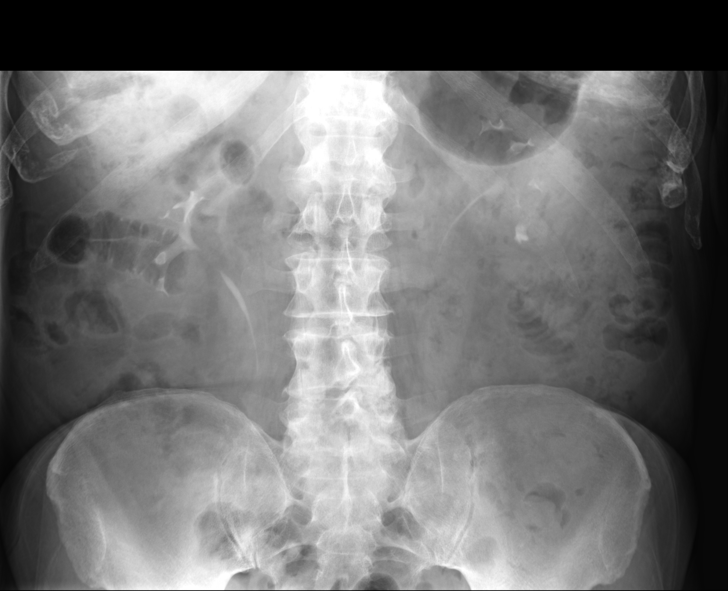
[im 13/15]
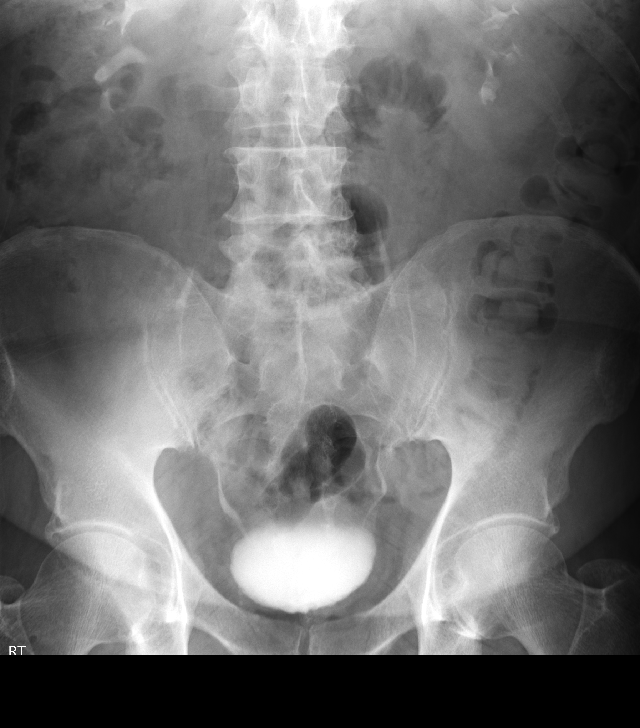
[im 14/15]
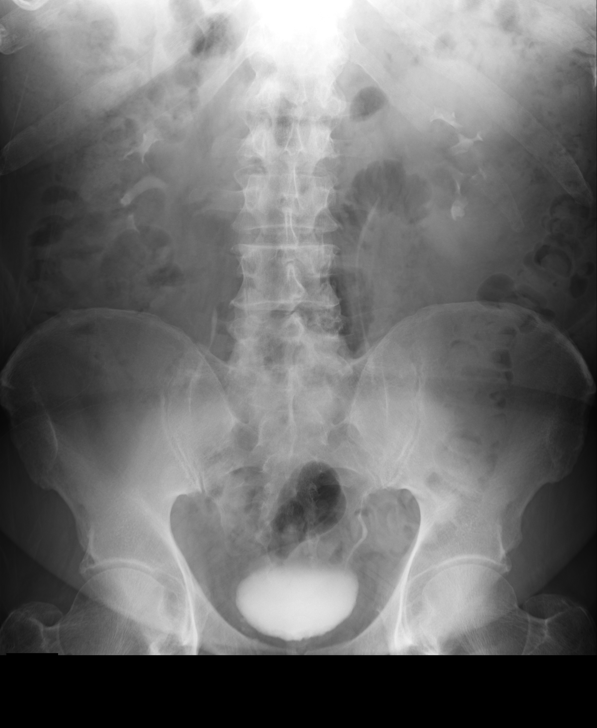
[im 15/15]
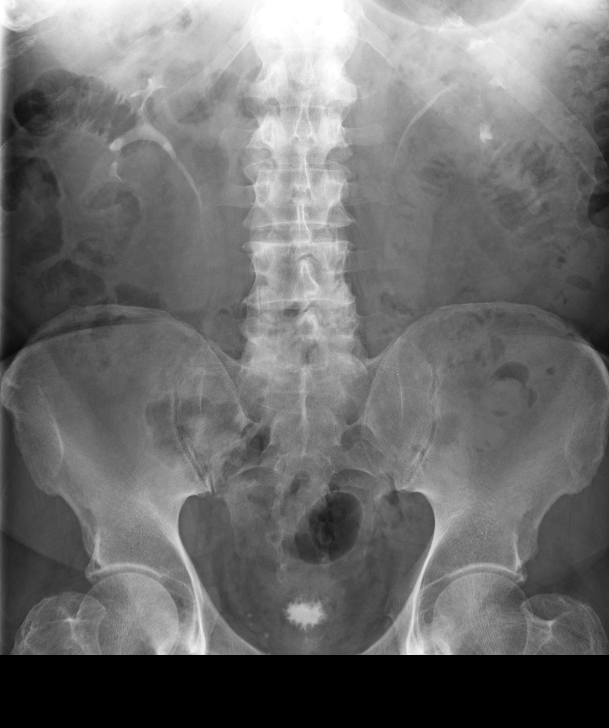

[13 of 15 positions shown; findings below may reference images not displayed]

FINDINGS: The scout film shows left renal stones, largest in the upper pole at 7 x 7 mm.
Proximal left ureteral stone not currently visible.
Postcontrast, the kidneys look normal in size and contour with prompt excretion into normal collecting units and ureters.
The bladder looks unremarkable. Minimal post void residual.
IMPRESSION: Proximal left ureteral stone not currently visible. Left nephrolithiasis similar to CT.

## 2022-03-15 IMAGING — OT DXA BONE DENSITY (DR. [PERSON_NAME] PROTOCOL)
2 series · 2 of 2 positions shown · non-contrast
Comparison: none

Images Obtained from [facility]
REASON FOR EXAM: Malignant neoplasm of the prostate without current treatment.
RISK FACTORS:  None
PRIOR EXAMS:  None
METHOD:  Scans of the spine between L1-L4 and hip were performed using dual energy X-ray densitometry (DXA).

[Series 1: — · 1 of 1 slices shown (1 of 2)]
[im 1/1]
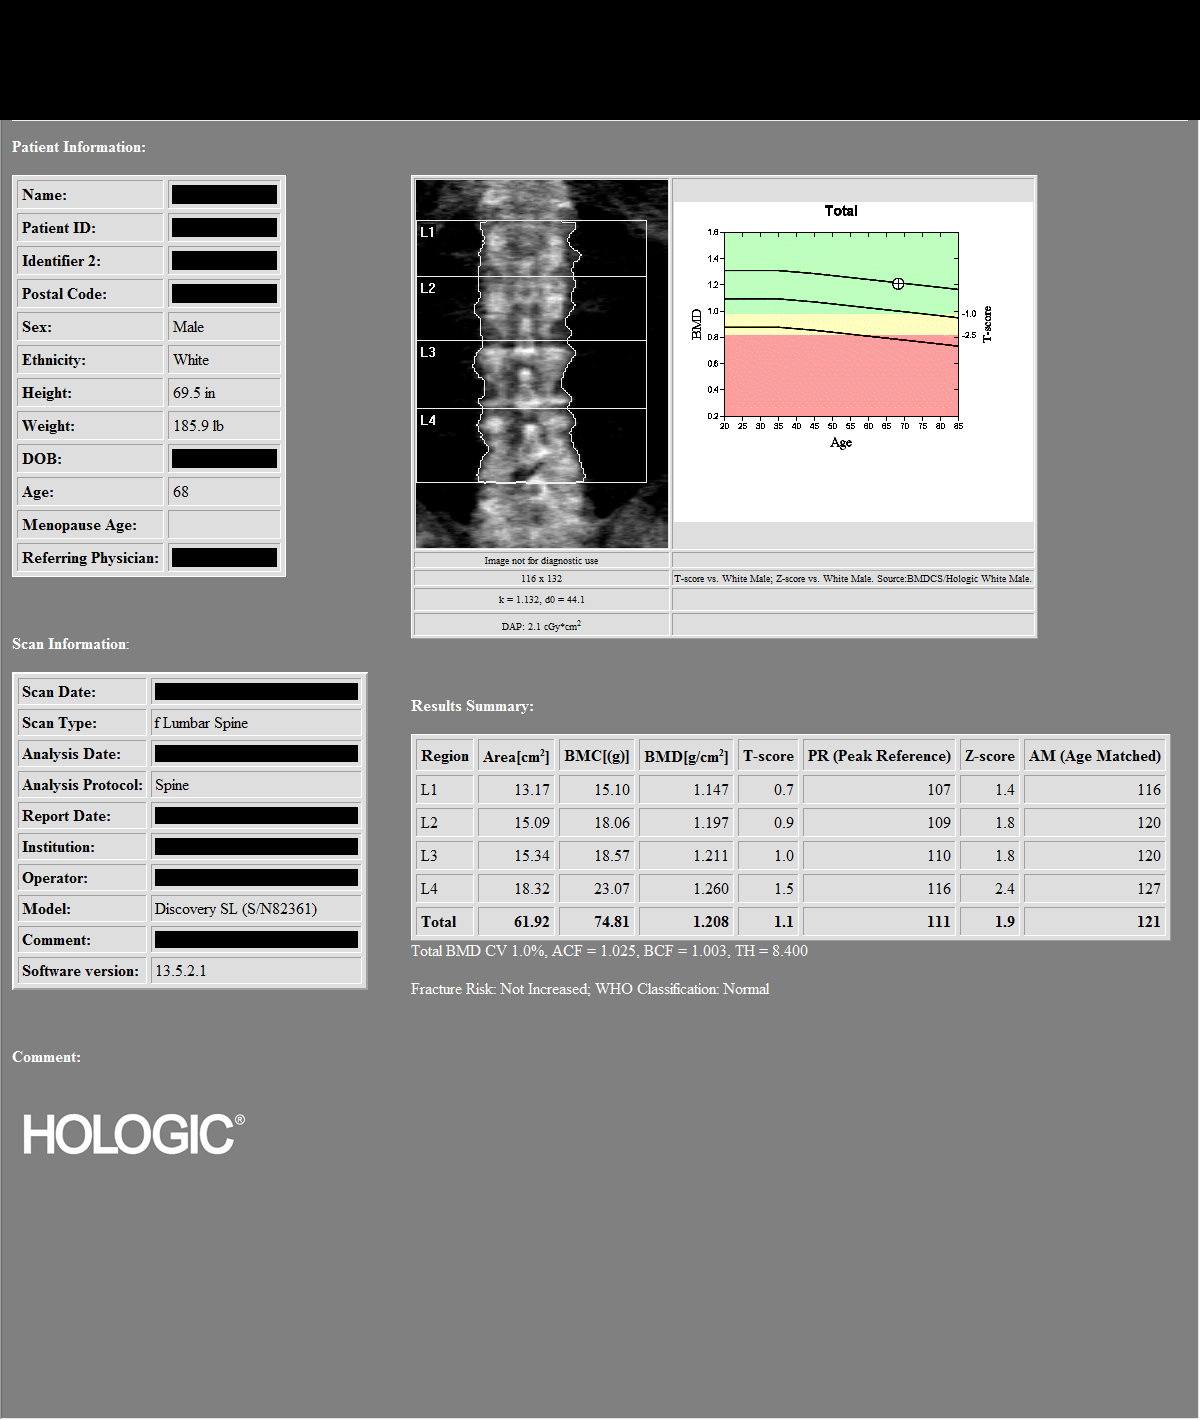

[Series 2: — · left · 1 of 1 slices shown (2 of 2)]
[im 1/1]
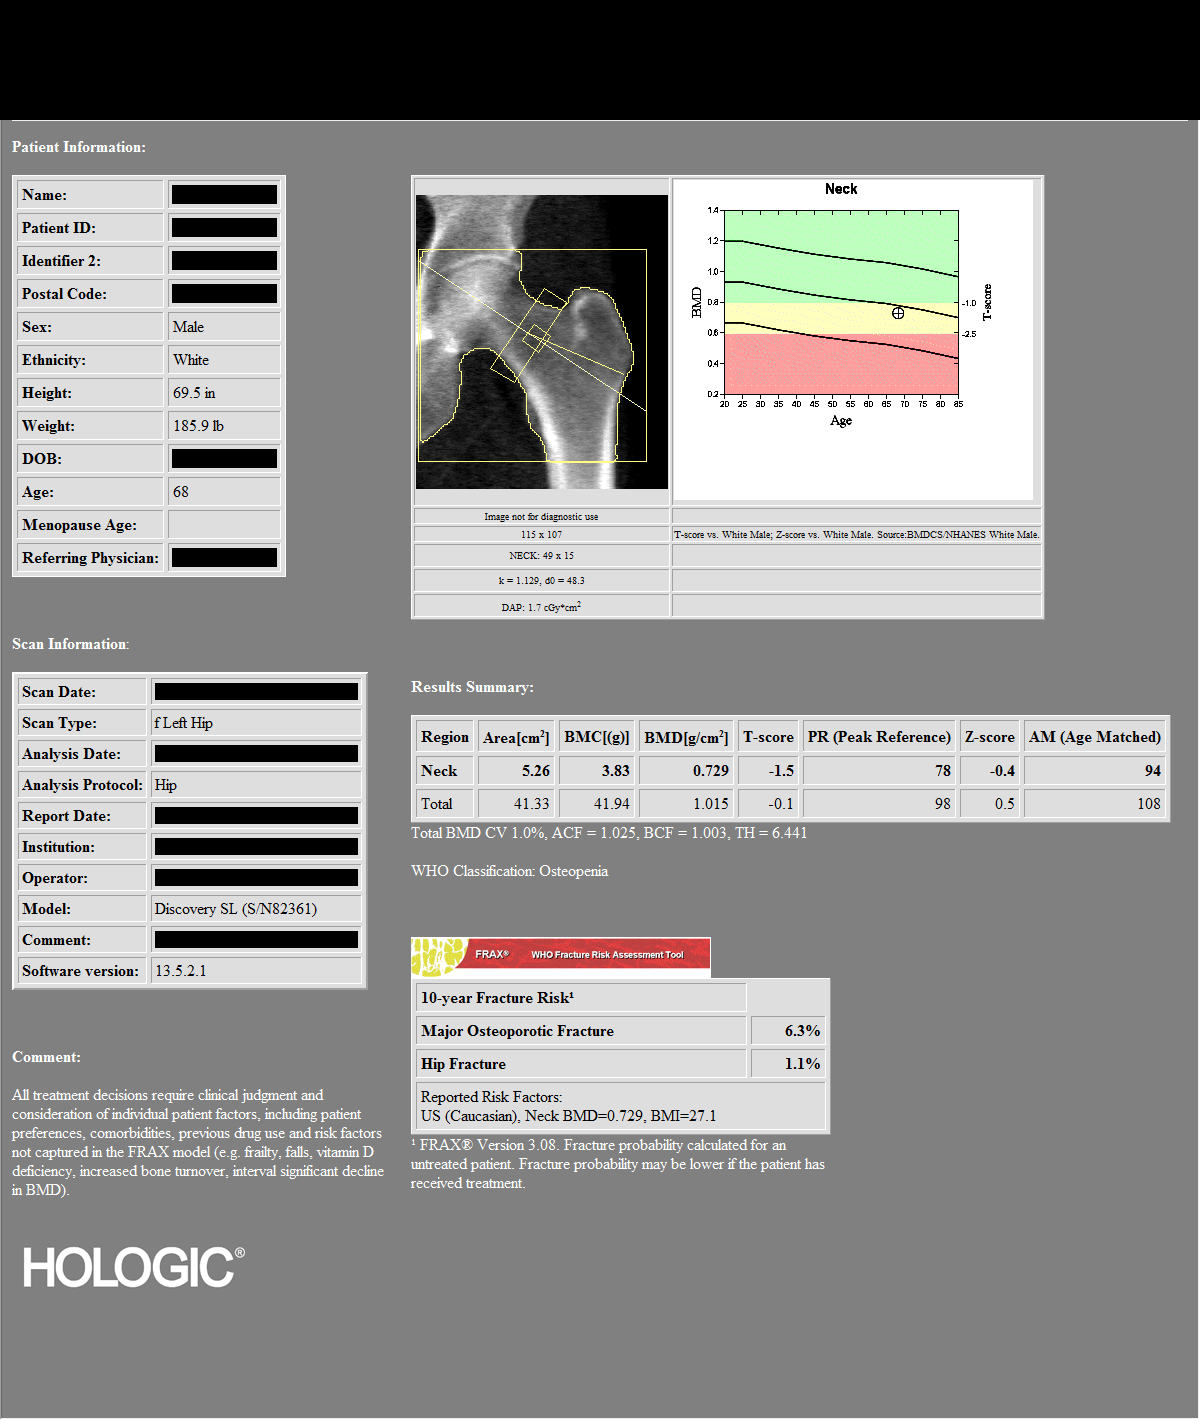

[2 of 2 positions shown; findings below may reference images not displayed]

IMPRESSION: As defined by World Health Organization, the patient meets the criteria for OSTEOPENIA based on left hip T-scores.
PATIENT DEMOGRAPHICS:  66 years old White  Male.
FINDINGS: 1.    Review of scanogram images shows no factor invalidating scan results.
2.    The lumbar spine exam using L1-L4 regions shows average Bone Mineral Density is 1.208 gm/cm2 of Hydroxyapatite.  The T-score (comparing patient with a young adult group) is 1.1 standard
deviations ABOVE mean. The Z-score (comparing patient with an age-matched group) is 1.9 standard deviations ABOVE mean.
3.  The left hip exam using femoral neck region of interest shows average Bone Mineral Density is 0.729 gm/cm2 of Hydroxyapatite. The T-score (comparing patient with a young adult group) is
standard deviations BELOW mean. The Z-score (comparing patient with an age-matched group) is 0.4 standard deviations BELOW mean.
According to the World Health Organization risk assessment tool (FRAX) for osteopenia only, which uses the femoral neck T-score and includes other patient risk factors for fracture, the patient has a
10-year absolute risk of hip fracture of 1.1% and 10-year absolute risk fracture for any major fracture of 6.3%. Clinicians judgment and/or patient preferences may indicate treatment for people with
10-year fracture probabilities above or below these levels.
RECOMMENDATIONS:  The patient states that he is not taking supplements on a regular basis.  The patient should continue being a nonsmoker and regular exercise to patient tolerance would be of
benefit.  The patient is currently not taking prescribed medication for prevention of bone loss.  According to criteria established by the national osteoporosis foundation, the patient DOES NOT meet
the current indications for prescribed medical therapy; However, patients undergoing treatment for prostate cancer may meet alternative criteria for prescribed medical therapy at the discretion of
the order provider.

## 2022-03-30 ENCOUNTER — Observation Stay
Admission: EM | Admit: 2022-03-30 | Discharge: 2022-03-30 | Disposition: A | Discharge status: Home / Self Care | Payer: Medicare HMO | Attending: Internal Medicine | Admitting: Internal Medicine

## 2022-03-30 ENCOUNTER — Other Ambulatory Visit: Payer: Self-pay

## 2022-03-30 DIAGNOSIS — I132 Hypertensive heart and chronic kidney disease with heart failure and with stage 5 chronic kidney disease, or end stage renal disease: Secondary | ICD-10-CM | POA: Diagnosis not present

## 2022-03-30 DIAGNOSIS — N186 End stage renal disease: Principal | ICD-10-CM | POA: Insufficient documentation

## 2022-03-30 DIAGNOSIS — I5033 Acute on chronic diastolic (congestive) heart failure: Secondary | ICD-10-CM | POA: Insufficient documentation

## 2022-03-30 DIAGNOSIS — Z79899 Other long term (current) drug therapy: Secondary | ICD-10-CM | POA: Diagnosis not present

## 2022-03-30 DIAGNOSIS — N179 Acute kidney failure, unspecified: Secondary | ICD-10-CM | POA: Diagnosis present

## 2022-03-30 DIAGNOSIS — R000 Tachycardia, unspecified: Secondary | ICD-10-CM | POA: Insufficient documentation

## 2022-03-30 DIAGNOSIS — R944 Abnormal results of kidney function studies: Secondary | ICD-10-CM | POA: Diagnosis present

## 2022-03-30 DIAGNOSIS — N184 Chronic kidney disease, stage 4 (severe): Secondary | ICD-10-CM | POA: Diagnosis present

## 2022-03-30 DIAGNOSIS — E1122 Type 2 diabetes mellitus with diabetic chronic kidney disease: Secondary | ICD-10-CM | POA: Diagnosis not present

## 2022-03-30 DIAGNOSIS — I10 Essential (primary) hypertension: Secondary | ICD-10-CM

## 2022-03-30 LAB — URINALYSIS, ROUTINE W REFLEX MICROSCOPIC
Bacteria, UA: NONE SEEN
Bilirubin Urine: NEGATIVE
Glucose, UA: >=500 mg/dL — AB
Ketones, ur: NEGATIVE mg/dL
Leukocytes,Ua: NEGATIVE
Nitrite: NEGATIVE
Protein, ur: >=300 mg/dL — AB
Specific Gravity, Urine: 1.009 (ref 1.005–1.030)
pH: 6 (ref 5.0–8.0)

## 2022-03-30 LAB — COMPREHENSIVE METABOLIC PANEL
ALT: 16 U/L (ref 0–44)
AST: 18 U/L (ref 15–41)
Albumin: 3 g/dL — ABNORMAL LOW (ref 3.5–5.0)
Alkaline Phosphatase: 134 U/L — ABNORMAL HIGH (ref 38–126)
Anion gap: 9 (ref 5–15)
BUN: 60 mg/dL — ABNORMAL HIGH (ref 8–23)
CO2: 22 mmol/L (ref 22–32)
Calcium: 8.7 mg/dL — ABNORMAL LOW (ref 8.9–10.3)
Chloride: 109 mmol/L (ref 98–111)
Creatinine, Ser: 6.06 mg/dL — ABNORMAL HIGH (ref 0.61–1.24)
GFR, Estimated: 9 mL/min — ABNORMAL LOW (ref >60)
Glucose, Bld: 317 mg/dL — ABNORMAL HIGH (ref 70–99)
Potassium: 5.2 mmol/L — ABNORMAL HIGH (ref 3.5–5.1)
Sodium: 140 mmol/L (ref 135–145)
Total Bilirubin: 0.7 mg/dL (ref 0.3–1.2)
Total Protein: 6.9 g/dL (ref 6.5–8.1)

## 2022-03-30 LAB — HEMOGLOBIN A1C
Hgb A1c MFr Bld: 7.8 % — ABNORMAL HIGH (ref 4.8–5.6)
Mean Plasma Glucose: 177.16 mg/dL

## 2022-03-30 LAB — CBC WITH DIFFERENTIAL/PLATELET
Abs Immature Granulocytes: 0.03 10*3/uL (ref 0.00–0.07)
Basophils Absolute: 0 10*3/uL (ref 0.0–0.1)
Basophils Relative: 0 %
Eosinophils Absolute: 0.4 10*3/uL (ref 0.0–0.5)
Eosinophils Relative: 6 %
HCT: 40.5 % (ref 39.0–52.0)
Hemoglobin: 11.9 g/dL — ABNORMAL LOW (ref 13.0–17.0)
Immature Granulocytes: 0 %
Lymphocytes Relative: 15 %
Lymphs Abs: 1.1 10*3/uL (ref 0.7–4.0)
MCH: 26.3 pg (ref 26.0–34.0)
MCHC: 29.4 g/dL — ABNORMAL LOW (ref 30.0–36.0)
MCV: 89.4 fL (ref 80.0–100.0)
Monocytes Absolute: 0.7 10*3/uL (ref 0.1–1.0)
Monocytes Relative: 9 %
Neutro Abs: 5.4 10*3/uL (ref 1.7–7.7)
Neutrophils Relative %: 70 %
Platelets: 288 10*3/uL (ref 150–400)
RBC: 4.53 MIL/uL (ref 4.22–5.81)
RDW: 17 % — ABNORMAL HIGH (ref 11.5–15.5)
WBC: 7.7 10*3/uL (ref 4.0–10.5)
nRBC: 0 % (ref 0.0–0.2)

## 2022-03-30 LAB — TSH: TSH: 2.465 u[IU]/mL (ref 0.350–4.500)

## 2022-03-30 MED ORDER — AMLODIPINE BESYLATE 10 MG PO TABS: 10.0000 mg | ORAL_TABLET | Freq: Every day | ORAL | 0 refills | Status: DC

## 2022-03-30 MED ORDER — LACTATED RINGERS IV BOLUS
500.0000 mL | Freq: Once | INTRAVENOUS | Status: AC
  Administered 2022-03-30: 500 mL via INTRAVENOUS

## 2022-03-30 MED ORDER — METOPROLOL TARTRATE 25 MG PO TABS: 25.0000 mg | ORAL_TABLET | Freq: Two times a day (BID) | ORAL | 0 refills | Status: DC

## 2022-03-30 NOTE — Progress Notes (Signed)
[facility]  ROUNDING NOTE   Subjective:   Victor Hernandez is a 66 year old male with past medical conditions including diabetes, hyperlipidemia, hypertension, congestive heart failure, and chronic kidney disease stage IV.  Patient was advised to come to emergency department by primary physician for abnormal labs.  Patient is known to our practice and was last seen by Dr. Candiss Norse in February of this year.  Patient states that his declining renal function was discussed at that visit but he was unaware that he needed to maintain follow-up appointments.  He attended CKD class at the office in March, but he has been lost to follow-up since that time.  Patient's renal function at that time was around 16%..  Patient presents to the emergency department with 9% renal function.  Patient states he feels well.  Denies food distaste or fatigue.  States he just mowed his grass yesterday.  Denies shortness of breath, chest or abdominal pain.  Denies known fever or chills.  Labs on ED arrival significant for potassium 5.2, glucose 317, BUN 60, creatinine 6.07 with GFR 9, calcium 8.7, and hemoglobin 11.9.  UA appears clear with glucose and proteinuria.  Objective:  Vital signs in last 24 hours:  Temp:  [97.7 F (36.5 C)] 97.7 F (36.5 C) (11/02 0920) Pulse Rate:  [113-116] 113 (11/02 1100) Resp:  [17-21] 17 (11/02 1100) BP: (192-203)/(123-139) 196/124 (11/02 1100) SpO2:  [92 %-95 %] 93 % (11/02 1100)  Weight change:  There were no vitals filed for this visit.  Intake/Output: No intake/output data recorded.   Intake/Output this shift:  Total I/O In: 500 [IV Piggyback:500] Out: -   Physical Exam: General: NAD, resting comfortably  Head: Normocephalic, atraumatic. Moist oral mucosal membranes  Eyes: Anicteric  Neck: Supple  Lungs:  Clear to auscultation, normal effort, room air  Heart: Regular rhythm, tachycardic  Abdomen:  Soft, nontender, distended  Extremities: No peripheral edema.   Neurologic: Nonfocal, moving all four extremities  Skin: No lesions  Access: None    Basic Metabolic Panel: Recent Labs  Lab 03/30/22 0936  NA 140  K 5.2*  CL 109  CO2 22  GLUCOSE 317*  BUN 60*  CREATININE 6.06*  CALCIUM 8.7*    Liver Function Tests: Recent Labs  Lab 03/30/22 0936  AST 18  ALT 16  ALKPHOS 134*  BILITOT 0.7  PROT 6.9  ALBUMIN 3.0*   No results for input(s): "LIPASE", "AMYLASE" in the last 168 hours. No results for input(s): "AMMONIA" in the last 168 hours.  CBC: Recent Labs  Lab 03/30/22 0936  WBC 7.7  NEUTROABS 5.4  HGB 11.9*  HCT 40.5  MCV 89.4  PLT 288    Cardiac Enzymes: No results for input(s): "CKTOTAL", "CKMB", "CKMBINDEX", "TROPONINI" in the last 168 hours.  BNP: Invalid input(s): "POCBNP"  CBG: No results for input(s): "GLUCAP" in the last 168 hours.  Microbiology: Results for orders placed or performed during the hospital encounter of 07/05/20  SARS CORONAVIRUS 2 (TAT 6-24 HRS) Nasopharyngeal Nasopharyngeal Swab     Status: None   Collection Time: 07/05/20 11:21 PM   Specimen: Nasopharyngeal Swab  Result Value Ref Range Status   SARS Coronavirus 2 NEGATIVE NEGATIVE Final    Comment: (NOTE) SARS-CoV-2 target nucleic acids are NOT DETECTED.  The SARS-CoV-2 RNA is generally detectable in upper and lower respiratory specimens during the acute phase of infection. Negative results do not preclude SARS-CoV-2 infection, do not rule out co-infections with other pathogens, and should not be  used as the sole basis for treatment or other patient management decisions. Negative results must be combined with clinical observations, patient history, and epidemiological information. The expected result is Negative.  Fact Sheet for Patients: SugarRoll.be  Fact Sheet for Healthcare Providers: https://www.woods-mathews.com/  This test is not yet approved or cleared by the [place] FDA  and  has been authorized for detection and/or diagnosis of SARS-CoV-2 by FDA under an Emergency Use Authorization (EUA). This EUA will remain  in effect (meaning this test can be used) for the duration of the COVID-19 declaration under Se ction 564(b)(1) of the Act, 21 U.S.C. section 360bbb-3(b)(1), unless the authorization is terminated or revoked sooner.  Performed at [facility], [place] [address], [place], [place] [address]     Coagulation Studies: No results for input(s): "LABPROT", "INR" in the last 72 hours.  Urinalysis: Recent Labs    03/30/22 0936  COLORURINE STRAW*  LABSPEC 1.009  PHURINE 6.0  GLUCOSEU >=500*  HGBUR SMALL*  BILIRUBINUR NEGATIVE  KETONESUR NEGATIVE  PROTEINUR >=300*  NITRITE NEGATIVE  LEUKOCYTESUR NEGATIVE      Imaging: No results found.   Medications:       Assessment/ Plan:  Victor Hernandez is a 66 y.o.  male  with past medical conditions including diabetes, hyperlipidemia, hypertension, congestive heart failure, and chronic kidney disease stage IV.  Patient was advised to come to emergency department by primary physician for abnormal labs.  Acute Kidney Injury on chronic kidney disease stage Iv with baseline creatinine 3.97 and GFR of 16 on 07/14/21.  Acute kidney injury secondary to progression of disease versus uncontrolled diabetes versus uncontrolled hypertension.  Uremic symptoms not present at this time.  No acute need for dialysis at this time however patient will require very close follow-up.  Discussed with patient our concern for his rapid renal failure and desire to initiate renal replacement therapy.  Patient requested a couple days to think about dialysis.  Patient is scheduled for follow-up appointment in our office on Monday for further discussions.  Lab Results  Component Value Date   CREATININE 6.06 (H) 03/30/2022   CREATININE 3.97 (H) 07/14/2021   CREATININE 3.02 (H) 03/21/2021    Intake/Output Summary (Last 24  hours) at 03/30/2022 1346 Last data filed at 03/30/2022 1106 Gross per 24 hour  Intake 500 ml  Output --  Net 500 ml   2, acute on chronic diastolic heart failure.  Last echo completed on 06/01/2020 shows EF 50 to 55% with a moderate LVH.   3.  Hypertension with chronic kidney disease.  Home regimen includes amlodipine, hydralazine, metoprolol, and torsemide.    LOS: 0   11/2/20231:46 PM

## 2022-03-30 NOTE — ED Notes (Signed)
Pt up to use bathroom 

## 2022-03-30 NOTE — Discharge Instructions (Addendum)
Your blood pressure and heart rate were very high today.  Increase your amlodipine to '10mg'$  once a day.  Restart metoprolol at '25mg'$  twice a day.   Follow up with nephrology on Monday, and make an appointment to see your primary care doctor in 1-2 weeks.

## 2022-03-30 NOTE — ED Triage Notes (Signed)
Pt comes with c/o needing to get kidneys checked. Pt state he had labs done week ago and was advised to come to ED. Pt states he didn't get to and went doc yesterday and they told him to come here.   Pt denies any pain or urinary symptoms.

## 2022-03-30 NOTE — ED Triage Notes (Signed)
First Nurse Note:  Arrives for ED evaluation of elevated BUN/CR:    labs drawn 03/21/2022.. bun:  59  CR:  5.7.  AAOx3.  Skin warm and dry. NAD

## 2022-03-30 NOTE — ED Provider Notes (Signed)
[facility] Provider Note    Event Date/Time   First MD Initiated Contact with Patient 03/30/22 [phone]     (approximate)   History   Chief Complaint: Kidneys checked   HPI  Victor Hernandez is a 66 y.o. male with a history of diabetes, hypertension, heart failure, CKD who is sent to the ED by primary care who evaluated the patient a week ago and found him to have worsening renal insufficiency with a creatinine of 5.7.  Patient denies any acute pain.  Denies orthopnea or dyspnea on exertion.  No chest pain or leg swelling.  Normally sleeps on his side.  Has continued working and states that he feels fine.     Physical Exam   Triage Vital Signs: ED Triage Vitals  Enc Vitals Group     BP 03/30/22 0920 (!) 203/139     Pulse Rate 03/30/22 0920 (!) 116     Resp 03/30/22 0920 19     Temp 03/30/22 0920 97.7 F (36.5 C)     Temp src --      SpO2 03/30/22 0920 95 %     Weight --      Height --      Head Circumference --      Peak Flow --      Pain Score 03/30/22 0919 0     Pain Loc --      Pain Edu? --      Excl. in [place]? --     Most recent vital signs: Vitals:   03/30/22 1030 03/30/22 1100  BP: (!) 198/124 (!) 196/124  Pulse: (!) 114 (!) 113  Resp: (!) 21 17  Temp:    SpO2: 92% 93%    General: Awake, no distress.  CV:  Good peripheral perfusion.  Tachycardia heart rate 110.  Loud systolic murmur Resp:  Normal effort.  Clear to auscultation bilaterally. Abd:  No distention.  Soft nontender Other:  Moist oral mucosa.  No lower extremity edema.   ED Results / Procedures / Treatments   Labs (all labs ordered are listed, but only abnormal results are displayed) Labs Reviewed  COMPREHENSIVE METABOLIC PANEL - Abnormal; Notable for the following components:      Result Value   Potassium 5.2 (*)    Glucose, Bld 317 (*)    BUN 60 (*)    Creatinine, Ser 6.06 (*)    Calcium 8.7 (*)    Albumin 3.0 (*)    Alkaline Phosphatase 134 (*)    GFR,  Estimated 9 (*)    All other components within normal limits  CBC WITH DIFFERENTIAL/PLATELET - Abnormal; Notable for the following components:   Hemoglobin 11.9 (*)    MCHC 29.4 (*)    RDW 17.0 (*)    All other components within normal limits  TSH  URINALYSIS, ROUTINE W REFLEX MICROSCOPIC  HEMOGLOBIN A1C     EKG Interpreted by me Sinus tachycardia rate 115.  Normal axis, normal intervals.  Normal QRS ST segments and T waves.   RADIOLOGY    PROCEDURES:  Procedures   MEDICATIONS ORDERED IN ED: Medications  lactated ringers bolus 500 mL (0 mLs Intravenous Stopped 03/30/22 1106)     IMPRESSION / MDM / ASSESSMENT AND PLAN / ED COURSE  I reviewed the triage vital signs and the nursing notes.  Differential diagnosis includes, but is not limited to, worsening CKD, AKI, electrolyte abnormality, anemia, UTI, dehydration  Patient's presentation is most consistent with acute presentation with potential threat to life or bodily function.  Patient presents for evaluation after being told by PCP he needs to come to the ED due to outpatient labs showing a creatinine of 5.7.  Patient's most recent outpatient labs before this that I can see in the computer were from 1 year ago when creatinine had trended up to 4.0, and nephrology in January of this year noted that he may be progressing to ESRD requiring initiation of hemodialysis.  Patient has no acute complaints currently.  We will give IV fluids to hydrate and see if this improves his tachycardia.  Per the patient there is a chronic component to this as well that was previously managed with beta-blockers.  We will check labs and contact nephrology  No evidence of acute CHF or volume overload.  ----------------------------------------- 11:16 AM on 03/30/2022 ----------------------------------------- Discussed with nephrology Dr. Zollie Scale who has seen the patient as well.  Patient is not interested in dialysis  at this time, has no immediate needs and so nephrology advises that patient can be followed up outpatient.  They have scheduled him to come to the office in 4 days.  Return precautions discussed.       FINAL CLINICAL IMPRESSION(S) / ED DIAGNOSES   Final diagnoses:  ESRD (end stage renal disease) ([place])  Uncontrolled hypertension  Sinus tachycardia     Rx / DC Orders   ED Discharge Orders          Ordered    amLODipine (NORVASC) 10 MG tablet  Daily        03/30/22 1120    metoprolol tartrate (LOPRESSOR) 25 MG tablet  2 times daily        03/30/22 1120             Note:  This document was prepared using Dragon voice recognition software and may include unintentional dictation errors.   Carrie Mew, MD 03/30/22 1121

## 2022-04-24 ENCOUNTER — Telehealth: Payer: Self-pay | Admitting: Family

## 2022-04-24 NOTE — Telephone Encounter (Signed)
LVM with patient that we need to schedule an appointment before we can refill his AZ& Me Request for a new prescription for farxiga.   Victor Hernandez, NT

## 2022-04-25 ENCOUNTER — Ambulatory Visit: Payer: Managed Care, Other (non HMO) | Admitting: Surgery

## 2022-05-09 ENCOUNTER — Other Ambulatory Visit: Payer: Self-pay | Admitting: Family

## 2022-05-09 DIAGNOSIS — I10 Essential (primary) hypertension: Secondary | ICD-10-CM

## 2022-05-24 ENCOUNTER — Other Ambulatory Visit: Payer: Self-pay | Admitting: Family

## 2022-05-24 DIAGNOSIS — I10 Essential (primary) hypertension: Secondary | ICD-10-CM

## 2023-02-20 ENCOUNTER — Other Ambulatory Visit: Payer: Self-pay

## 2023-02-20 ENCOUNTER — Emergency Department
Admission: EM | Admit: 2023-02-20 | Discharge: 2023-02-20 | Disposition: A | Discharge status: Home / Self Care | Payer: Medicare HMO | Attending: Emergency Medicine | Admitting: Emergency Medicine

## 2023-02-20 ENCOUNTER — Emergency Department: Payer: Medicare HMO

## 2023-02-20 DIAGNOSIS — R052 Subacute cough: Secondary | ICD-10-CM | POA: Diagnosis not present

## 2023-02-20 DIAGNOSIS — E1122 Type 2 diabetes mellitus with diabetic chronic kidney disease: Secondary | ICD-10-CM | POA: Diagnosis not present

## 2023-02-20 DIAGNOSIS — R2243 Localized swelling, mass and lump, lower limb, bilateral: Secondary | ICD-10-CM | POA: Diagnosis not present

## 2023-02-20 DIAGNOSIS — R0602 Shortness of breath: Secondary | ICD-10-CM | POA: Diagnosis not present

## 2023-02-20 DIAGNOSIS — N184 Chronic kidney disease, stage 4 (severe): Secondary | ICD-10-CM | POA: Diagnosis not present

## 2023-02-20 DIAGNOSIS — I130 Hypertensive heart and chronic kidney disease with heart failure and stage 1 through stage 4 chronic kidney disease, or unspecified chronic kidney disease: Secondary | ICD-10-CM | POA: Diagnosis not present

## 2023-02-20 DIAGNOSIS — I509 Heart failure, unspecified: Secondary | ICD-10-CM | POA: Diagnosis not present

## 2023-02-20 DIAGNOSIS — R059 Cough, unspecified: Secondary | ICD-10-CM | POA: Diagnosis present

## 2023-02-20 LAB — HEPATIC FUNCTION PANEL
ALT: 22 U/L (ref 0–44)
AST: 18 U/L (ref 15–41)
Albumin: 2.7 g/dL — ABNORMAL LOW (ref 3.5–5.0)
Alkaline Phosphatase: 124 U/L (ref 38–126)
Bilirubin, Direct: 0.1 mg/dL (ref 0.0–0.2)
Indirect Bilirubin: 0.8 mg/dL (ref 0.3–0.9)
Total Bilirubin: 0.9 mg/dL (ref 0.3–1.2)
Total Protein: 5.8 g/dL — ABNORMAL LOW (ref 6.5–8.1)

## 2023-02-20 LAB — CBC
HCT: 33.1 % — ABNORMAL LOW (ref 39.0–52.0)
Hemoglobin: 9.8 g/dL — ABNORMAL LOW (ref 13.0–17.0)
MCH: 26.3 pg (ref 26.0–34.0)
MCHC: 29.6 g/dL — ABNORMAL LOW (ref 30.0–36.0)
MCV: 89 fL (ref 80.0–100.0)
Platelets: 250 10*3/uL (ref 150–400)
RBC: 3.72 MIL/uL — ABNORMAL LOW (ref 4.22–5.81)
RDW: 17 % — ABNORMAL HIGH (ref 11.5–15.5)
WBC: 6.3 10*3/uL (ref 4.0–10.5)
nRBC: 0 % (ref 0.0–0.2)

## 2023-02-20 LAB — BASIC METABOLIC PANEL
Anion gap: 13 (ref 5–15)
BUN: 73 mg/dL — ABNORMAL HIGH (ref 8–23)
CO2: 23 mmol/L (ref 22–32)
Calcium: 8.1 mg/dL — ABNORMAL LOW (ref 8.9–10.3)
Chloride: 108 mmol/L (ref 98–111)
Creatinine, Ser: 6.79 mg/dL — ABNORMAL HIGH (ref 0.61–1.24)
GFR, Estimated: 8 mL/min — ABNORMAL LOW (ref >60)
Glucose, Bld: 208 mg/dL — ABNORMAL HIGH (ref 70–99)
Potassium: 4.2 mmol/L (ref 3.5–5.1)
Sodium: 144 mmol/L (ref 135–145)

## 2023-02-20 LAB — TROPONIN I (HIGH SENSITIVITY): Troponin I (High Sensitivity): 4 ng/L (ref <18)

## 2023-02-20 NOTE — ED Triage Notes (Signed)
Pt with cough, congestion "for a while". Pt also states he has CP when coughing and intermittent SOB. Pt reports "profuse itching" and hx of kidney disease. Denies recent fevers, N/V/D.

## 2023-02-20 NOTE — ED Provider Notes (Signed)
[facility] Provider Note    Event Date/Time   First MD Initiated Contact with Patient 02/20/23 1552     (approximate)   History   Cough   HPI Victor Hernandez is a 67 y.o. male DM2, CKD stage IV, CHF, HTN, HLD presenting today with cough.  Patient states for over a month he has been having intermittent coughing most notable when laying down as well as shortness of breath when laying flat.  He is also started noticing some swelling to his lower extremities building up over the past month and a half.  Notices symptoms mostly at night and feels fine when up walking around during the day.  Denies any chest pain, nausea, vomiting, abdominal pain, lightheadedness.  He states over the past several days he recently increased his home torsemide from 50 mg to 100 mg to see if that would help pull more fluid off.  Denies any fevers or chills.     Physical Exam   Triage Vital Signs: ED Triage Vitals [02/20/23 1358]  Encounter Vitals Group     BP 111/61     Systolic BP Percentile      Diastolic BP Percentile      Pulse Rate 92     Resp 16     Temp 98.8 F (37.1 C)     Temp Source Oral     SpO2 98 %     Weight      Height      Head Circumference      Peak Flow      Pain Score 0     Pain Loc      Pain Education      Exclude from Growth Chart     Most recent vital signs: Vitals:   02/20/23 1358  BP: 111/61  Pulse: 92  Resp: 16  Temp: 98.8 F (37.1 C)  SpO2: 98%   Physical Exam: I have reviewed the vital signs and nursing notes. General: Awake, alert, no acute distress.  Nontoxic appearing. Head:  Atraumatic, normocephalic.   ENT:  EOM intact, PERRL. Oral mucosa is pink and moist with no lesions. Neck: Neck is supple with full range of motion, No meningeal signs. Cardiovascular:  RRR, No murmurs. Peripheral pulses palpable and equal bilaterally. Respiratory:  Symmetrical chest wall expansion.  Rhonchi noted in bases bilaterally.  Good air movement  throughout.  No use of accessory muscles.   Musculoskeletal:  No cyanosis.  1+ pitting edema to bilateral lower extremities.  Moving extremities with full ROM Abdomen:  Soft, nontender, nondistended. Neuro:  GCS 15, moving all four extremities, interacting appropriately. Speech clear. Psych:  Calm, appropriate.   Skin:  Warm, dry, no rash.    ED Results / Procedures / Treatments   Labs (all labs ordered are listed, but only abnormal results are displayed) Labs Reviewed  BASIC METABOLIC PANEL - Abnormal; Notable for the following components:      Result Value   Glucose, Bld 208 (*)    BUN 73 (*)    Creatinine, Ser 6.79 (*)    Calcium 8.1 (*)    GFR, Estimated 8 (*)    All other components within normal limits  CBC - Abnormal; Notable for the following components:   RBC 3.72 (*)    Hemoglobin 9.8 (*)    HCT 33.1 (*)    MCHC 29.6 (*)    RDW 17.0 (*)    All other components within normal limits  HEPATIC  FUNCTION PANEL - Abnormal; Notable for the following components:   Total Protein 5.8 (*)    Albumin 2.7 (*)    All other components within normal limits  TROPONIN I (HIGH SENSITIVITY)     EKG My EKG interpretation, rate of 83, atrial fibrillation.  Normal axis.  QTc 446.  No acute ST elevations or depressions.   RADIOLOGY Interpreted chest x-ray showing mild pulmonary edema but otherwise unremarkable   PROCEDURES:  Critical Care performed: No  Procedures   MEDICATIONS ORDERED IN ED: Medications - No data to display   IMPRESSION / MDM / ASSESSMENT AND PLAN / ED COURSE  I reviewed the triage vital signs and the nursing notes.                              Differential diagnosis includes, but is not limited to, mild volume overload, viral URI, pneumonia.  Patient's presentation is most consistent with exacerbation of chronic illness.  Patient is a 67 year old male with prior history of congestive heart failure with EF 50 to 55% presenting today for shortness of  breath and lower extremity edema over the past month.  No history of fever or productive cough to suspect pneumonia on chest x-ray does not show this either.  Symptoms do not correlate with viral upper respiratory infection either.  Chest x-ray and exam more consistent with slight volume overload.  Patient is not tachypneic or hypoxic at rest or with ambulation here today.  Laboratory workup otherwise consistent with his baseline.  EKG does show atrial fibrillation which I do not see a prior EKG at similar levels.  Patient has not had any lightheadedness symptoms related recently.  Do suspect today's symptoms are mostly related to slight volume overload and recommend continue with his higher dose of torsemide at this time and follow-up with primary care provider.  At that time, would recommend also getting a Holter monitor placed outpatient to see if patient is more frequently in A-fib.  There was an EKG in 2023 showing possible atrial tachycardia but unsure if it was clear A-fib.  Patient is agreeable with this plan and given strict return precautions.  The patient is on the cardiac monitor to evaluate for evidence of arrhythmia and/or significant heart rate changes. Clinical Course as of 02/20/23 1655  Tue Feb 20, 2023  1612 DG Chest 2 View Independently interpreted with mild pulmonary edema present but otherwise unremarkable [DW]    Clinical Course User Index [DW] Janith Lima, MD     FINAL CLINICAL IMPRESSION(S) / ED DIAGNOSES   Final diagnoses:  Acute on chronic congestive heart failure, unspecified heart failure type (HCC)  Subacute cough     Rx / DC Orders   ED Discharge Orders     None        Note:  This document was prepared using Dragon voice recognition software and may include unintentional dictation errors.   Janith Lima, MD 02/20/23 [phone]

## 2023-02-20 NOTE — Discharge Instructions (Signed)
You were seen in the emergency department today for your cough and shortness of breath over the past month.  Symptoms seem most consistent with slight volume overload.  I recommend continuing with the 1 tablet a day of your fluid pill to see if this helps pull additional fluid off of your body which would help symptoms.  Please follow-up with your regular doctor for reassessment in the next week or 2 decide if additional changes need to be made at that time.  Separately, your EKG showed possible atrial fibrillation and would recommend discussing this with your regular doctor with repeat EKG or potential wearable heart monitor to see if you are consistently in this rhythm.  Please return to the emergency department if you have worsening shortness of breath or chest pain.

## 2023-03-12 ENCOUNTER — Emergency Department: Payer: Medicare HMO

## 2023-03-12 ENCOUNTER — Other Ambulatory Visit: Payer: Self-pay

## 2023-03-12 ENCOUNTER — Inpatient Hospital Stay
Admission: EM | Admit: 2023-03-12 | Discharge: 2023-03-20 | DRG: 673 | Disposition: A | Discharge status: Home / Self Care | Payer: Medicare HMO | Attending: Internal Medicine | Admitting: Internal Medicine

## 2023-03-12 DIAGNOSIS — N179 Acute kidney failure, unspecified: Principal | ICD-10-CM | POA: Diagnosis present

## 2023-03-12 DIAGNOSIS — I10 Essential (primary) hypertension: Secondary | ICD-10-CM

## 2023-03-12 DIAGNOSIS — I4729 Other ventricular tachycardia: Secondary | ICD-10-CM

## 2023-03-12 DIAGNOSIS — I161 Hypertensive emergency: Secondary | ICD-10-CM | POA: Diagnosis present

## 2023-03-12 DIAGNOSIS — Z87891 Personal history of nicotine dependence: Secondary | ICD-10-CM | POA: Diagnosis not present

## 2023-03-12 DIAGNOSIS — E119 Type 2 diabetes mellitus without complications: Secondary | ICD-10-CM

## 2023-03-12 DIAGNOSIS — I2510 Atherosclerotic heart disease of native coronary artery without angina pectoris: Secondary | ICD-10-CM | POA: Diagnosis present

## 2023-03-12 DIAGNOSIS — N189 Chronic kidney disease, unspecified: Secondary | ICD-10-CM | POA: Diagnosis not present

## 2023-03-12 DIAGNOSIS — I340 Nonrheumatic mitral (valve) insufficiency: Secondary | ICD-10-CM | POA: Diagnosis present

## 2023-03-12 DIAGNOSIS — J9601 Acute respiratory failure with hypoxia: Secondary | ICD-10-CM | POA: Diagnosis present

## 2023-03-12 DIAGNOSIS — Z79899 Other long term (current) drug therapy: Secondary | ICD-10-CM | POA: Diagnosis not present

## 2023-03-12 DIAGNOSIS — E785 Hyperlipidemia, unspecified: Secondary | ICD-10-CM | POA: Diagnosis present

## 2023-03-12 DIAGNOSIS — Z1152 Encounter for screening for COVID-19: Secondary | ICD-10-CM | POA: Diagnosis not present

## 2023-03-12 DIAGNOSIS — I5031 Acute diastolic (congestive) heart failure: Secondary | ICD-10-CM | POA: Diagnosis not present

## 2023-03-12 DIAGNOSIS — N2581 Secondary hyperparathyroidism of renal origin: Secondary | ICD-10-CM | POA: Diagnosis present

## 2023-03-12 DIAGNOSIS — I132 Hypertensive heart and chronic kidney disease with heart failure and with stage 5 chronic kidney disease, or end stage renal disease: Secondary | ICD-10-CM | POA: Diagnosis present

## 2023-03-12 DIAGNOSIS — Z794 Long term (current) use of insulin: Secondary | ICD-10-CM | POA: Diagnosis not present

## 2023-03-12 DIAGNOSIS — I472 Ventricular tachycardia, unspecified: Secondary | ICD-10-CM | POA: Diagnosis not present

## 2023-03-12 DIAGNOSIS — E1122 Type 2 diabetes mellitus with diabetic chronic kidney disease: Secondary | ICD-10-CM | POA: Diagnosis present

## 2023-03-12 DIAGNOSIS — Z7982 Long term (current) use of aspirin: Secondary | ICD-10-CM | POA: Diagnosis not present

## 2023-03-12 DIAGNOSIS — I3139 Other pericardial effusion (noninflammatory): Secondary | ICD-10-CM | POA: Diagnosis present

## 2023-03-12 DIAGNOSIS — I5033 Acute on chronic diastolic (congestive) heart failure: Secondary | ICD-10-CM | POA: Diagnosis present

## 2023-03-12 DIAGNOSIS — E1151 Type 2 diabetes mellitus with diabetic peripheral angiopathy without gangrene: Secondary | ICD-10-CM | POA: Diagnosis present

## 2023-03-12 DIAGNOSIS — D631 Anemia in chronic kidney disease: Secondary | ICD-10-CM | POA: Diagnosis present

## 2023-03-12 DIAGNOSIS — Z992 Dependence on renal dialysis: Secondary | ICD-10-CM | POA: Diagnosis not present

## 2023-03-12 DIAGNOSIS — N186 End stage renal disease: Principal | ICD-10-CM

## 2023-03-12 DIAGNOSIS — Z4901 Encounter for fitting and adjustment of extracorporeal dialysis catheter: Secondary | ICD-10-CM

## 2023-03-12 DIAGNOSIS — N185 Chronic kidney disease, stage 5: Secondary | ICD-10-CM | POA: Insufficient documentation

## 2023-03-12 LAB — BRAIN NATRIURETIC PEPTIDE: B Natriuretic Peptide: 2517.8 pg/mL — ABNORMAL HIGH (ref 0.0–100.0)

## 2023-03-12 LAB — CBC WITH DIFFERENTIAL/PLATELET
Abs Immature Granulocytes: 0.02 10*3/uL (ref 0.00–0.07)
Basophils Absolute: 0 10*3/uL (ref 0.0–0.1)
Basophils Relative: 0 %
Eosinophils Absolute: 0.2 10*3/uL (ref 0.0–0.5)
Eosinophils Relative: 3 %
HCT: 32.4 % — ABNORMAL LOW (ref 39.0–52.0)
Hemoglobin: 9.5 g/dL — ABNORMAL LOW (ref 13.0–17.0)
Immature Granulocytes: 0 %
Lymphocytes Relative: 19 %
Lymphs Abs: 1.4 10*3/uL (ref 0.7–4.0)
MCH: 26 pg (ref 26.0–34.0)
MCHC: 29.3 g/dL — ABNORMAL LOW (ref 30.0–36.0)
MCV: 88.8 fL (ref 80.0–100.0)
Monocytes Absolute: 0.7 10*3/uL (ref 0.1–1.0)
Monocytes Relative: 9 %
Neutro Abs: 4.9 10*3/uL (ref 1.7–7.7)
Neutrophils Relative %: 69 %
Platelets: 271 10*3/uL (ref 150–400)
RBC: 3.65 MIL/uL — ABNORMAL LOW (ref 4.22–5.81)
RDW: 17 % — ABNORMAL HIGH (ref 11.5–15.5)
WBC: 7.1 10*3/uL (ref 4.0–10.5)
nRBC: 0 % (ref 0.0–0.2)

## 2023-03-12 LAB — COMPREHENSIVE METABOLIC PANEL
ALT: 23 U/L (ref 0–44)
AST: 17 U/L (ref 15–41)
Albumin: 2.7 g/dL — ABNORMAL LOW (ref 3.5–5.0)
Alkaline Phosphatase: 117 U/L (ref 38–126)
Anion gap: 13 (ref 5–15)
BUN: 98 mg/dL — ABNORMAL HIGH (ref 8–23)
CO2: 23 mmol/L (ref 22–32)
Calcium: 8.4 mg/dL — ABNORMAL LOW (ref 8.9–10.3)
Chloride: 107 mmol/L (ref 98–111)
Creatinine, Ser: 7.27 mg/dL — ABNORMAL HIGH (ref 0.61–1.24)
GFR, Estimated: 8 mL/min — ABNORMAL LOW (ref >60)
Glucose, Bld: 166 mg/dL — ABNORMAL HIGH (ref 70–99)
Potassium: 4.1 mmol/L (ref 3.5–5.1)
Sodium: 143 mmol/L (ref 135–145)
Total Bilirubin: 0.8 mg/dL (ref 0.3–1.2)
Total Protein: 6 g/dL — ABNORMAL LOW (ref 6.5–8.1)

## 2023-03-12 LAB — SARS CORONAVIRUS 2 BY RT PCR: SARS Coronavirus 2 by RT PCR: NEGATIVE

## 2023-03-12 LAB — MAGNESIUM: Magnesium: 2.2 mg/dL (ref 1.7–2.4)

## 2023-03-12 MED ORDER — HYDRALAZINE HCL 25 MG PO TABS
25.0000 mg | ORAL_TABLET | Freq: Three times a day (TID) | ORAL | Status: AC
  Administered 2023-03-12 – 2023-03-13 (×3): 25 mg via ORAL
  Filled 2023-03-12 (×3): qty 1

## 2023-03-12 MED ORDER — FUROSEMIDE 10 MG/ML IJ SOLN
80.0000 mg | Freq: Once | INTRAMUSCULAR | Status: AC
  Administered 2023-03-12: 80 mg via INTRAVENOUS
  Filled 2023-03-12: qty 8

## 2023-03-12 MED ORDER — METOPROLOL TARTRATE 25 MG PO TABS
25.0000 mg | ORAL_TABLET | Freq: Two times a day (BID) | ORAL | Status: DC
  Administered 2023-03-12 – 2023-03-16 (×8): 25 mg via ORAL
  Filled 2023-03-12 (×9): qty 1

## 2023-03-12 MED ORDER — LABETALOL HCL 5 MG/ML IV SOLN
10.0000 mg | Freq: Once | INTRAVENOUS | Status: AC
  Administered 2023-03-12: 10 mg via INTRAVENOUS
  Filled 2023-03-12: qty 4

## 2023-03-12 NOTE — ED Triage Notes (Signed)
Pt reports he had blood work done at PCP last week and got a call today to come to ER because his kidney function was bad. Pt denies any lower back or abdominal pain, denies dysuria but states he has had an inc in frequency.

## 2023-03-12 NOTE — H&P (Incomplete)
History and Physical    Patient: Victor Hernandez:[id number] DOB: 1955-07-14 DOA: 03/12/2023 DOS: the patient was seen and examined on 03/12/2023 PCP: Armando Gang, FNP  Patient coming from: Home  Chief Complaint:  Chief Complaint  Patient presents with  . Abnormal Lab    HPI: Victor Hernandez is a 67 y.o. male with medical history significant for hypertension, insulin-dependent type 2 diabetes mellitus, HFpEF, severe mitral regurg on echo 2022, CKD stage IV-V, sent in by PCP for abnormal blood work.  Patient had been reporting increasing lower extremity edema dyspnea on exertion and orthopnea.  He denied chest pain, fever or chills. ED Course and data review: BP 194/119 on arrival with pulse in the 90s and low 100s and tachypneic at times to 33 with O2 sat intermittently dipping to as low as 86% on room air. Labs: BNP 2518 CBC notable for normal WBC and hemoglobin 9.5 which appears to be his baseline CMP notable for creatinine 7.28 with normal bicarb of 23 and potassium 4.1, GFR 8 EKG not done Chest x-ray, read by ED provider showing pulmonary vascular congestion Patient was treated with IV labetalol oral hydralazine and metoprolol and also given a dose of IV Lasix.   The ED provider spoke with nephrologist, Dr. Lourdes Sledge who will see patient in the a.m.  He has that he remains n.p.o. likely for placement of vascular access. Hospitalist consulted for admission.   Review of Systems: As mentioned in the history of present illness. All other systems reviewed and are negative.  Past Medical History:  Diagnosis Date  . CHF (congestive heart failure) (HCC)   . Chronic kidney disease   . Diabetes mellitus without complication (HCC)   . Hyperlipidemia   . Hypertension    Past Surgical History:  Procedure Laterality Date  . COLONOSCOPY    . COLONOSCOPY WITH PROPOFOL N/A 05/19/2016   Procedure: COLONOSCOPY WITH PROPOFOL;  Surgeon: Kieth Brightly, MD;  Location: [facility] ENDOSCOPY;   Service: Endoscopy;  Laterality: N/A;   Social History:  reports that he quit smoking about 48 years ago. His smoking use included cigarettes. He has never used smokeless tobacco. He reports that he does not drink alcohol and does not use drugs.  No Known Allergies  History reviewed. No pertinent family history.  Prior to Admission medications   Medication Sig Start Date End Date Taking? Authorizing Provider  acetaminophen (TYLENOL) 325 MG tablet Take 650 mg by mouth every 6 (six) hours as needed.    [provider]  amLODipine (NORVASC) 10 MG tablet Take 1 tablet (10 mg total) by mouth daily. 03/30/22 04/29/22  Sharman Cheek, MD  aspirin EC 81 MG tablet Take 1 tablet (81 mg total) by mouth daily. 07/12/20   Arnetha Courser, MD  atorvastatin (LIPITOR) 40 MG tablet Take 40 mg by mouth every morning. 02/24/20   [provider]  blood glucose meter kit and supplies Dispense based on patient and insurance preference. Use up to four times daily as directed. (FOR ICD-10 E10.9, E11.9). 06/02/20   Lurene Shadow, MD  dapagliflozin propanediol (FARXIGA) 10 MG TABS tablet Take 1 tablet (10 mg total) by mouth daily. 09/08/21   Delma Freeze, FNP  Ferrous Sulfate (SLOW FE) 142 (45 Fe) MG TBCR Take 1 tablet by mouth daily.    [provider]  Finerenone (KERENDIA) 10 MG TABS Take 10 mg by mouth daily.    [provider]  hydrALAZINE (APRESOLINE) 25 MG tablet TAKE 1 TABLET BY  MOUTH THREE TIMES A DAY 05/09/22   Clarisa Kindred A, FNP  insulin aspart (NOVOLOG) 100 UNIT/ML injection Inject 30 Units into the skin daily. Sliding scale    [provider]  metoprolol tartrate (LOPRESSOR) 25 MG tablet Take 1 tablet (25 mg total) by mouth 2 (two) times daily. 03/30/22 04/29/22  Sharman Cheek, MD  potassium chloride (KLOR-CON) 10 MEQ tablet Take 10 mEq by mouth daily.    [provider]  torsemide (DEMADEX) 100 MG tablet Take 1 tablet (100 mg total) by mouth  daily. Patient taking differently: Take 50 mg by mouth daily. 07/13/20   Arnetha Courser, MD    Physical Exam: Vitals:   03/12/23 2214 03/12/23 2215 03/12/23 2230 03/12/23 2300  BP: (!) 183/121 (!) 183/121 (!) 160/105 (!) 147/91  Pulse: 99 (!) 102 90 92  Resp:   10 (!) 33  Temp:      TempSrc:      SpO2:  (!) 86% 90% 93%  Weight:      Height:       Physical Exam  Labs on Admission: I have personally reviewed following labs and imaging studies  CBC: Recent Labs  Lab 03/12/23 1916  WBC 7.1  NEUTROABS 4.9  HGB 9.5*  HCT 32.4*  MCV 88.8  PLT 271   Basic Metabolic Panel: Recent Labs  Lab 03/12/23 1916 03/12/23 2151  NA 143  --   K 4.1  --   CL 107  --   CO2 23  --   GLUCOSE 166*  --   BUN 98*  --   CREATININE 7.27*  --   CALCIUM 8.4*  --   MG  --  2.2   GFR: Estimated Creatinine Clearance: 10.2 mL/min (A) (by C-G formula based on SCr of 7.27 mg/dL (H)). Liver Function Tests: Recent Labs  Lab 03/12/23 1916  AST 17  ALT 23  ALKPHOS 117  BILITOT 0.8  PROT 6.0*  ALBUMIN 2.7*   No results for input(s): "LIPASE", "AMYLASE" in the last 168 hours. No results for input(s): "AMMONIA" in the last 168 hours. Coagulation Profile: No results for input(s): "INR", "PROTIME" in the last 168 hours. Cardiac Enzymes: No results for input(s): "CKTOTAL", "CKMB", "CKMBINDEX", "TROPONINI" in the last 168 hours. BNP (last 3 results) No results for input(s): "PROBNP" in the last 8760 hours. HbA1C: No results for input(s): "HGBA1C" in the last 72 hours. CBG: No results for input(s): "GLUCAP" in the last 168 hours. Lipid Profile: No results for input(s): "CHOL", "HDL", "LDLCALC", "TRIG", "CHOLHDL", "LDLDIRECT" in the last 72 hours. Thyroid Function Tests: No results for input(s): "TSH", "T4TOTAL", "FREET4", "T3FREE", "THYROIDAB" in the last 72 hours. Anemia Panel: No results for input(s): "VITAMINB12", "FOLATE", "FERRITIN", "TIBC", "IRON", "RETICCTPCT" in the last 72  hours. Urine analysis:    Component Value Date/Time   COLORURINE STRAW (A) 03/30/2022 0936   APPEARANCEUR CLEAR (A) 03/30/2022 0936   LABSPEC 1.009 03/30/2022 0936   PHURINE 6.0 03/30/2022 0936   GLUCOSEU >=500 (A) 03/30/2022 0936   HGBUR SMALL (A) 03/30/2022 0936   BILIRUBINUR NEGATIVE 03/30/2022 0936   KETONESUR NEGATIVE 03/30/2022 0936   PROTEINUR >=300 (A) 03/30/2022 0936   NITRITE NEGATIVE 03/30/2022 0936   LEUKOCYTESUR NEGATIVE 03/30/2022 0936    Radiological Exams on Admission: No results found.   Data Reviewed: Relevant notes from primary care and specialist visits, past discharge summaries as available in EHR, including Care Everywhere. Prior diagnostic testing as pertinent to current admission diagnoses Updated medications and problem lists  for reconciliation ED course, including vitals, labs, imaging, treatment and response to treatment Triage notes, nursing and pharmacy notes and ED provider's notes Notable results as noted in HPI   Assessment and Plan: No notes have been filed under this hospital service. Service: Hospitalist       DVT prophylaxis: Lovenox***  Consults: none***  Advance Care Planning:   Code Status: Prior ***  Family Communication: none***  Disposition Plan: Back to previous home environment  Severity of Illness: {Observation/Inpatient:21159}  Author: Andris Baumann, MD 03/12/2023 11:32 PM  For on call review [email or web address].

## 2023-03-12 NOTE — ED Notes (Signed)
Provider at bedside

## 2023-03-12 NOTE — ED Notes (Signed)
Pt given Ice chips per provider order.

## 2023-03-12 NOTE — ED Provider Notes (Signed)
[facility] Provider Note    Event Date/Time   First MD Initiated Contact with Patient 03/12/23 2114     (approximate)   History   Abnormal Lab   HPI  Victor Hernandez is a 67 y.o. male  here with abnormal lab. Pt was sent in form his PCP for abnormal Cr. He has known CKD but has had worsening swelling, SOB over the past month. He is being referred for PD evaluation but has not started any dialysis yet. He had outpt labs drawn which showed acute on chronic kidney injury, with Cr >6 now. He continues to make urine. No fevers. Denies any NSAIDs. No vomiting. No flank pain.       Physical Exam   Triage Vital Signs: ED Triage Vitals  Encounter Vitals Group     BP 03/12/23 1908 (!) 194/119     Systolic BP Percentile --      Diastolic BP Percentile --      Pulse Rate 03/12/23 1908 95     Resp 03/12/23 1908 18     Temp 03/12/23 1908 97.9 F (36.6 C)     Temp Source 03/12/23 1908 Oral     SpO2 03/12/23 1913 92 %     Weight 03/12/23 1913 180 lb (81.6 kg)     Height 03/12/23 1913 5\' 11"  (1.803 m)     Head Circumference --      Peak Flow --      Pain Score 03/12/23 1912 0     Pain Loc --      Pain Education --      Exclude from Growth Chart --     Most recent vital signs: Vitals:   03/12/23 2230 03/12/23 2300  BP: (!) 160/105 (!) 147/91  Pulse: 90 92  Resp: 10 (!) 33  Temp:    SpO2: 90% 93%     General: Awake, no distress.  CV:  Good peripheral perfusion. RRR. Resp:  Normal work of breathing. Lungs clear bilaterally. Abd:  No distention. No tenderness. Other:  2+ pitting edema bl LE.   ED Results / Procedures / Treatments   Labs (all labs ordered are listed, but only abnormal results are displayed) Labs Reviewed  CBC WITH DIFFERENTIAL/PLATELET - Abnormal; Notable for the following components:      Result Value   RBC 3.65 (*)    Hemoglobin 9.5 (*)    HCT 32.4 (*)    MCHC 29.3 (*)    RDW 17.0 (*)    All other components within normal  limits  COMPREHENSIVE METABOLIC PANEL - Abnormal; Notable for the following components:   Glucose, Bld 166 (*)    BUN 98 (*)    Creatinine, Ser 7.27 (*)    Calcium 8.4 (*)    Total Protein 6.0 (*)    Albumin 2.7 (*)    GFR, Estimated 8 (*)    All other components within normal limits  BRAIN NATRIURETIC PEPTIDE - Abnormal; Notable for the following components:   B Natriuretic Peptide 2,517.8 (*)    All other components within normal limits  SARS CORONAVIRUS 2 BY RT PCR  MAGNESIUM     EKG    RADIOLOGY CXR:on my review, bilateral pulm edema noted   I also independently reviewed and agree with radiologist interpretations.   PROCEDURES:  Critical Care performed: No   MEDICATIONS ORDERED IN ED: Medications  hydrALAZINE (APRESOLINE) tablet 25 mg (25 mg Oral Given 03/12/23 2214)  metoprolol tartrate (LOPRESSOR)  tablet 25 mg (25 mg Oral Given 03/12/23 2214)  furosemide (LASIX) injection 80 mg (has no administration in time range)  labetalol (NORMODYNE) injection 10 mg (10 mg Intravenous Given 03/12/23 2224)     IMPRESSION / MDM / ASSESSMENT AND PLAN / ED COURSE  I reviewed the triage vital signs and the nursing notes.                              Differential diagnosis includes, but is not limited to, perforation of CKD, CHF, pulmonary edema, pneumonia  Patient's presentation is most consistent with acute presentation with potential threat to life or bodily function.  The patient is on the cardiac monitor to evaluate for evidence of arrhythmia and/or significant heart rate changes   67 year old male here with worsening renal function.  Patient likely has progressed to end-stage renal disease with significant bilateral lower extremity edema, symptoms concerning for CHF with orthopnea.  BNP markedly elevated at 2500.  CBC with anemia likely secondary to his CKD.  CMP shows BUN 98, creatinine 7.7.  Discussed with Dr. Cherylann Ratel, patient will be seen for HD tomorrow. NPO at [facility].  IV labetalol given for severe HTN in addition to his PO meds.   FINAL CLINICAL IMPRESSION(S) / ED DIAGNOSES   Final diagnoses:  ESRD (end stage renal disease) (HCC)     Rx / DC Orders   ED Discharge Orders     None        Note:  This document was prepared using Dragon voice recognition software and may include unintentional dictation errors.   Shaune Pollack, MD 03/12/23 [phone]

## 2023-03-12 NOTE — ED Notes (Signed)
Attempted bladder scan x 3. Measurements reveal less than 6ml after urination.

## 2023-03-12 NOTE — H&P (Signed)
History and Physical    Patient: Victor Hernandez VHQ:[id number] DOB: 1956-01-08 DOA: 03/12/2023 DOS: the patient was seen and examined on 03/12/2023 PCP: Armando Gang, FNP  Patient coming from: Home  Chief Complaint:  Chief Complaint  Patient presents with   Abnormal Lab    HPI: Victor Hernandez is a 67 y.o. male with medical history significant for hypertension, insulin-dependent type 2 diabetes mellitus, HFpEF, severe mitral regurg on echo 2022, CKD stage IV-V, sent in by PCP for abnormal blood work, specifically worsening renal function.  Patient had been reporting increasing lower extremity edema dyspnea on exertion and orthopnea.  He denied chest pain, fever or chill.  No headache lethargy or change in mentation ED Course and data review: BP 194/119 on arrival with pulse in the 90s and low 100s and tachypneic at times to 33 with O2 sat intermittently dipping to as low as 86% on room air. Labs: BNP 2518 CBC notable for normal WBC and hemoglobin 9.5 which appears to be his baseline CMP notable for creatinine 7.28 with normal bicarb of 23 and potassium 4.1, GFR 8 EKG not done Chest x-ray, read by ED provider showing pulmonary vascular congestion Patient was treated with IV labetalol oral hydralazine and metoprolol and also given a dose of IV Lasix.   The ED provider spoke with nephrologist, Dr. Lourdes Sledge who will see patient in the a.m.  He has that he remains n.p.o. likely for placement of vascular access. Hospitalist consulted for admission.   Review of Systems: As mentioned in the history of present illness. All other systems reviewed and are negative.  Past Medical History:  Diagnosis Date   CHF (congestive heart failure) (HCC)    Chronic kidney disease    Diabetes mellitus without complication (HCC)    Hyperlipidemia    Hypertension    Past Surgical History:  Procedure Laterality Date   COLONOSCOPY     COLONOSCOPY WITH PROPOFOL N/A 05/19/2016   Procedure: COLONOSCOPY WITH  PROPOFOL;  Surgeon: Kieth Brightly, MD;  Location: [facility] ENDOSCOPY;  Service: Endoscopy;  Laterality: N/A;   Social History:  reports that he quit smoking about 48 years ago. His smoking use included cigarettes. He has never used smokeless tobacco. He reports that he does not drink alcohol and does not use drugs.  No Known Allergies  History reviewed. No pertinent family history.  Prior to Admission medications   Medication Sig Start Date End Date Taking? Authorizing Provider  acetaminophen (TYLENOL) 325 MG tablet Take 650 mg by mouth every 6 (six) hours as needed.    [provider]  amLODipine (NORVASC) 10 MG tablet Take 1 tablet (10 mg total) by mouth daily. 03/30/22 04/29/22  Sharman Cheek, MD  aspirin EC 81 MG tablet Take 1 tablet (81 mg total) by mouth daily. 07/12/20   Arnetha Courser, MD  atorvastatin (LIPITOR) 40 MG tablet Take 40 mg by mouth every morning. 02/24/20   [provider]  blood glucose meter kit and supplies Dispense based on patient and insurance preference. Use up to four times daily as directed. (FOR ICD-10 E10.9, E11.9). 06/02/20   Lurene Shadow, MD  dapagliflozin propanediol (FARXIGA) 10 MG TABS tablet Take 1 tablet (10 mg total) by mouth daily. 09/08/21   Delma Freeze, FNP  Ferrous Sulfate (SLOW FE) 142 (45 Fe) MG TBCR Take 1 tablet by mouth daily.    [provider]  Finerenone (KERENDIA) 10 MG TABS Take 10 mg by mouth daily.    [provider]  hydrALAZINE (APRESOLINE) 25 MG tablet TAKE 1 TABLET BY MOUTH THREE TIMES A DAY 05/09/22   Clarisa Kindred A, FNP  insulin aspart (NOVOLOG) 100 UNIT/ML injection Inject 30 Units into the skin daily. Sliding scale    [provider]  metoprolol tartrate (LOPRESSOR) 25 MG tablet Take 1 tablet (25 mg total) by mouth 2 (two) times daily. 03/30/22 04/29/22  Sharman Cheek, MD  potassium chloride (KLOR-CON) 10 MEQ tablet Take 10 mEq by mouth daily.    [provider]   torsemide (DEMADEX) 100 MG tablet Take 1 tablet (100 mg total) by mouth daily. Patient taking differently: Take 50 mg by mouth daily. 07/13/20   Arnetha Courser, MD    Physical Exam: Vitals:   03/12/23 2214 03/12/23 2215 03/12/23 2230 03/12/23 2300  BP: (!) 183/121 (!) 183/121 (!) 160/105 (!) 147/91  Pulse: 99 (!) 102 90 92  Resp:   10 (!) 33  Temp:      TempSrc:      SpO2:  (!) 86% 90% 93%  Weight:      Height:       Physical Exam Vitals and nursing note reviewed.  Constitutional:      General: He is not in acute distress.    Comments: Conversational dyspnea  HENT:     Head: Normocephalic and atraumatic.  Cardiovascular:     Rate and Rhythm: Regular rhythm. Tachycardia present.     Heart sounds: Normal heart sounds.  Pulmonary:     Effort: Tachypnea present.     Breath sounds: Normal breath sounds.  Abdominal:     Palpations: Abdomen is soft.     Tenderness: There is no abdominal tenderness.  Musculoskeletal:     Right lower leg: 3+ Edema present.     Left lower leg: 3+ Edema present.  Neurological:     Mental Status: Mental status is at baseline.     Labs on Admission: I have personally reviewed following labs and imaging studies  CBC: Recent Labs  Lab 03/12/23 1916  WBC 7.1  NEUTROABS 4.9  HGB 9.5*  HCT 32.4*  MCV 88.8  PLT 271   Basic Metabolic Panel: Recent Labs  Lab 03/12/23 1916 03/12/23 2151  NA 143  --   K 4.1  --   CL 107  --   CO2 23  --   GLUCOSE 166*  --   BUN 98*  --   CREATININE 7.27*  --   CALCIUM 8.4*  --   MG  --  2.2   GFR: Estimated Creatinine Clearance: 10.2 mL/min (A) (by C-G formula based on SCr of 7.27 mg/dL (H)). Liver Function Tests: Recent Labs  Lab 03/12/23 1916  AST 17  ALT 23  ALKPHOS 117  BILITOT 0.8  PROT 6.0*  ALBUMIN 2.7*   No results for input(s): "LIPASE", "AMYLASE" in the last 168 hours. No results for input(s): "AMMONIA" in the last 168 hours. Coagulation Profile: No results for input(s): "INR",  "PROTIME" in the last 168 hours. Cardiac Enzymes: No results for input(s): "CKTOTAL", "CKMB", "CKMBINDEX", "TROPONINI" in the last 168 hours. BNP (last 3 results) No results for input(s): "PROBNP" in the last 8760 hours. HbA1C: No results for input(s): "HGBA1C" in the last 72 hours. CBG: No results for input(s): "GLUCAP" in the last 168 hours. Lipid Profile: No results for input(s): "CHOL", "HDL", "LDLCALC", "TRIG", "CHOLHDL", "LDLDIRECT" in the last 72 hours. Thyroid Function Tests: No results for input(s): "TSH", "T4TOTAL", "FREET4", "T3FREE", "THYROIDAB" in the  last 72 hours. Anemia Panel: No results for input(s): "VITAMINB12", "FOLATE", "FERRITIN", "TIBC", "IRON", "RETICCTPCT" in the last 72 hours. Urine analysis:    Component Value Date/Time   COLORURINE STRAW (A) 03/30/2022 0936   APPEARANCEUR CLEAR (A) 03/30/2022 0936   LABSPEC 1.009 03/30/2022 0936   PHURINE 6.0 03/30/2022 0936   GLUCOSEU >=500 (A) 03/30/2022 0936   HGBUR SMALL (A) 03/30/2022 0936   BILIRUBINUR NEGATIVE 03/30/2022 0936   KETONESUR NEGATIVE 03/30/2022 0936   PROTEINUR >=300 (A) 03/30/2022 0936   NITRITE NEGATIVE 03/30/2022 0936   LEUKOCYTESUR NEGATIVE 03/30/2022 0936    Radiological Exams on Admission: No results found.   Data Reviewed: Relevant notes from primary care and specialist visits, past discharge summaries as available in EHR, including Care Everywhere. Prior diagnostic testing as pertinent to current admission diagnoses Updated medications and problem lists for reconciliation ED course, including vitals, labs, imaging, treatment and response to treatment Triage notes, nursing and pharmacy notes and ED provider's notes Notable results as noted in HPI   Assessment and Plan: * Acute on chronic diastolic CHF  (HCC) Hypertensive emergency Acute respiratory failure with hypoxia Secondary to fluid overload and likely need for dialysis EF 50 to 55% with LVH 05/2020 IV Lasix, IV hydralazine  for BP control Daily weights with intake and output monitoring Echocardiogram Expecting further improvement if dialysis is initiated  ESRD needing dialysis (HCC) Bicarb and potassium normal Nephrology consulted from ED--keeping n.p.o. per urology   Diabetes mellitus, type II (HCC) Sliding scale insulin coverage  Anemia of chronic kidney failure, stage 5 (HCC) Appears at baseline    DVT prophylaxis: heparin  Consults: nephrology  Advance Care Planning:   Code Status: Prior   Family Communication: none  Disposition Plan: Back to previous home environment  Severity of Illness: The appropriate patient status for this patient is INPATIENT. Inpatient status is judged to be reasonable and necessary in order to provide the required intensity of service to ensure the patient's safety. The patient's presenting symptoms, physical exam findings, and initial radiographic and laboratory data in the context of their chronic comorbidities is felt to place them at high risk for further clinical deterioration. Furthermore, it is not anticipated that the patient will be medically stable for discharge from the hospital within 2 midnights of admission.   * I certify that at the point of admission it is my clinical judgment that the patient will require inpatient hospital care spanning beyond 2 midnights from the point of admission due to high intensity of service, high risk for further deterioration and high frequency of surveillance required.*  Author: Andris Baumann, MD 03/12/2023 11:32 PM  For on call review [email or web address].

## 2023-03-13 ENCOUNTER — Inpatient Hospital Stay (HOSPITAL_COMMUNITY)
Admit: 2023-03-13 | Discharge: 2023-03-13 | Disposition: A | Discharge status: Home / Self Care | Payer: Medicare HMO | Attending: Internal Medicine

## 2023-03-13 ENCOUNTER — Encounter: Payer: Self-pay | Admitting: Internal Medicine

## 2023-03-13 ENCOUNTER — Encounter
Admission: EM | Disposition: A | Discharge status: Home / Self Care | Payer: Self-pay | Source: Home / Self Care | Attending: Internal Medicine

## 2023-03-13 DIAGNOSIS — N186 End stage renal disease: Secondary | ICD-10-CM | POA: Diagnosis not present

## 2023-03-13 DIAGNOSIS — Z4901 Encounter for fitting and adjustment of extracorporeal dialysis catheter: Secondary | ICD-10-CM

## 2023-03-13 DIAGNOSIS — I5031 Acute diastolic (congestive) heart failure: Secondary | ICD-10-CM | POA: Diagnosis not present

## 2023-03-13 DIAGNOSIS — I5033 Acute on chronic diastolic (congestive) heart failure: Secondary | ICD-10-CM | POA: Diagnosis not present

## 2023-03-13 DIAGNOSIS — N179 Acute kidney failure, unspecified: Principal | ICD-10-CM | POA: Diagnosis present

## 2023-03-13 DIAGNOSIS — Z992 Dependence on renal dialysis: Secondary | ICD-10-CM

## 2023-03-13 DIAGNOSIS — N189 Chronic kidney disease, unspecified: Secondary | ICD-10-CM

## 2023-03-13 HISTORY — PX: TEMPORARY DIALYSIS CATHETER: CATH118312

## 2023-03-13 LAB — ECHOCARDIOGRAM COMPLETE
AR max vel: 2.24 cm2
AV Area VTI: 2.59 cm2
AV Area mean vel: 2.21 cm2
AV Mean grad: 2 mm[Hg]
AV Peak grad: 4.2 mm[Hg]
Ao pk vel: 1.03 m/s
Area-P 1/2: 4.15 cm2
Height: 71 in
MV M vel: 4.48 m/s
MV Peak grad: 80.3 mm[Hg]
MV VTI: 0.89 cm2
Radius: 0.8 cm
S' Lateral: 3.89 cm
Weight: 3156.8 [oz_av]

## 2023-03-13 LAB — BASIC METABOLIC PANEL
Anion gap: 12 (ref 5–15)
BUN: 97 mg/dL — ABNORMAL HIGH (ref 8–23)
CO2: 23 mmol/L (ref 22–32)
Calcium: 8.5 mg/dL — ABNORMAL LOW (ref 8.9–10.3)
Chloride: 108 mmol/L (ref 98–111)
Creatinine, Ser: 7.15 mg/dL — ABNORMAL HIGH (ref 0.61–1.24)
GFR, Estimated: 8 mL/min — ABNORMAL LOW (ref >60)
Glucose, Bld: 137 mg/dL — ABNORMAL HIGH (ref 70–99)
Potassium: 3.9 mmol/L (ref 3.5–5.1)
Sodium: 143 mmol/L (ref 135–145)

## 2023-03-13 LAB — CBG MONITORING, ED
Glucose-Capillary: 139 mg/dL — ABNORMAL HIGH (ref 70–99)
Glucose-Capillary: 116 mg/dL — ABNORMAL HIGH (ref 70–99)
Glucose-Capillary: 216 mg/dL — ABNORMAL HIGH (ref 70–99)

## 2023-03-13 LAB — HEMOGLOBIN A1C
Hgb A1c MFr Bld: 8.5 % — ABNORMAL HIGH (ref 4.8–5.6)
Mean Plasma Glucose: 197.25 mg/dL

## 2023-03-13 LAB — HIV ANTIBODY (ROUTINE TESTING W REFLEX): HIV Screen 4th Generation wRfx: NONREACTIVE

## 2023-03-13 LAB — GLUCOSE, CAPILLARY
Glucose-Capillary: 192 mg/dL — ABNORMAL HIGH (ref 70–99)
Glucose-Capillary: 192 mg/dL — ABNORMAL HIGH (ref 70–99)
Glucose-Capillary: 212 mg/dL — ABNORMAL HIGH (ref 70–99)

## 2023-03-13 SURGERY — TEMPORARY DIALYSIS CATHETER
Anesthesia: Moderate Sedation

## 2023-03-13 MED ORDER — CHLORHEXIDINE GLUCONATE CLOTH 2 % EX PADS
6.0000 | MEDICATED_PAD | Freq: Every day | CUTANEOUS | Status: DC
  Administered 2023-03-14 – 2023-03-20 (×6): 6 via TOPICAL

## 2023-03-13 MED ORDER — FUROSEMIDE 10 MG/ML IJ SOLN
40.0000 mg | Freq: Two times a day (BID) | INTRAMUSCULAR | Status: DC
  Administered 2023-03-13 – 2023-03-14 (×3): 40 mg via INTRAVENOUS
  Filled 2023-03-13 (×3): qty 4

## 2023-03-13 MED ORDER — ONDANSETRON HCL 4 MG PO TABS: 4.0000 mg | ORAL_TABLET | Freq: Four times a day (QID) | ORAL | Status: DC | PRN

## 2023-03-13 MED ORDER — HEPARIN SODIUM (PORCINE) 5000 UNIT/ML IJ SOLN
5000.0000 [IU] | Freq: Three times a day (TID) | INTRAMUSCULAR | Status: DC
  Administered 2023-03-13 – 2023-03-20 (×21): 5000 [IU] via SUBCUTANEOUS
  Filled 2023-03-13 (×22): qty 1

## 2023-03-13 MED ORDER — ORAL CARE MOUTH RINSE: 15.0000 mL | OROMUCOSAL | Status: DC | PRN

## 2023-03-13 MED ORDER — HYDRALAZINE HCL 20 MG/ML IJ SOLN
5.0000 mg | INTRAMUSCULAR | Status: DC | PRN
  Administered 2023-03-15: 5 mg via INTRAVENOUS
  Filled 2023-03-13: qty 1

## 2023-03-13 MED ORDER — HYDRALAZINE HCL 50 MG PO TABS: 25.0000 mg | ORAL_TABLET | Freq: Three times a day (TID) | ORAL | Status: DC

## 2023-03-13 MED ORDER — ASPIRIN 81 MG PO TBEC
81.0000 mg | DELAYED_RELEASE_TABLET | Freq: Every day | ORAL | Status: DC
  Administered 2023-03-13 – 2023-03-20 (×7): 81 mg via ORAL
  Filled 2023-03-13 (×7): qty 1

## 2023-03-13 MED ORDER — ONDANSETRON HCL 4 MG/2ML IJ SOLN: 4.0000 mg | Freq: Four times a day (QID) | INTRAMUSCULAR | Status: DC | PRN

## 2023-03-13 MED ORDER — AMLODIPINE BESYLATE 10 MG PO TABS
10.0000 mg | ORAL_TABLET | Freq: Every day | ORAL | Status: DC
  Administered 2023-03-13 – 2023-03-20 (×6): 10 mg via ORAL
  Filled 2023-03-13 (×6): qty 1

## 2023-03-13 MED ORDER — ACETAMINOPHEN 325 MG PO TABS
650.0000 mg | ORAL_TABLET | Freq: Four times a day (QID) | ORAL | Status: DC | PRN
  Administered 2023-03-19: 650 mg via ORAL
  Filled 2023-03-13: qty 2

## 2023-03-13 MED ORDER — ACETAMINOPHEN 650 MG RE SUPP: 650.0000 mg | Freq: Four times a day (QID) | RECTAL | Status: DC | PRN

## 2023-03-13 MED ORDER — ATORVASTATIN CALCIUM 20 MG PO TABS
40.0000 mg | ORAL_TABLET | Freq: Every morning | ORAL | Status: DC
  Administered 2023-03-13 – 2023-03-20 (×7): 40 mg via ORAL
  Filled 2023-03-13 (×7): qty 2

## 2023-03-13 MED ORDER — INSULIN ASPART 100 UNIT/ML IJ SOLN
0.0000 [IU] | INTRAMUSCULAR | Status: DC
  Administered 2023-03-13 (×2): 5 [IU] via SUBCUTANEOUS
  Administered 2023-03-13: 2 [IU] via SUBCUTANEOUS
  Administered 2023-03-13 – 2023-03-14 (×3): 3 [IU] via SUBCUTANEOUS
  Filled 2023-03-13 (×6): qty 1

## 2023-03-13 MED ORDER — METOPROLOL TARTRATE 25 MG PO TABS: 25.0000 mg | ORAL_TABLET | Freq: Two times a day (BID) | ORAL | Status: DC

## 2023-03-13 SURGICAL SUPPLY — 8 items
COVER PRB 48X5XTLSCP FOLD TPE (BAG) IMPLANT
COVER PROBE 5X48 (BAG) ×1
GOWN STRL REUS W/ TWL LRG LVL3 (GOWN DISPOSABLE) ×1 IMPLANT
GOWN STRL REUS W/TWL LRG LVL3 (GOWN DISPOSABLE) ×1
KIT DIALYSIS CATH TRI 30X13 (CATHETERS) IMPLANT
NDL ENTRY 21GA 7CM ECHOTIP (NEEDLE) IMPLANT
NEEDLE ENTRY 21GA 7CM ECHOTIP (NEEDLE) ×1 IMPLANT
SET INTRO CAPELLA COAXIAL (SET/KITS/TRAYS/PACK) IMPLANT

## 2023-03-13 NOTE — H&P (View-Only) (Signed)
Hospital Consult    Reason for Consult:  Temporary dialysis Catheter placement.  Requesting Physician:  Malachi Carl NP  MRN #:  [id number]  History of Present Illness: This is a 67 y.o. male with medical history significant for hypertension, insulin-dependent type 2 diabetes mellitus, HFpEF, severe mitral regurg on echo 2022, CKD stage IV-V, sent in by PCP for abnormal blood work, specifically worsening renal function.  Patient had been reporting increasing lower extremity edema dyspnea on exertion and orthopnea.  He denied chest pain, fever or chill.  No headache lethargy or change in mentation. On work up he was found to have a BNP of 2518 and A GFR of 8. Vascular Surgery Consulted for placement of Temporary dialysis catheter.   Past Medical History:  Diagnosis Date   CHF (congestive heart failure) (HCC)    Chronic kidney disease    Diabetes mellitus without complication (HCC)    Hyperlipidemia    Hypertension     Past Surgical History:  Procedure Laterality Date   COLONOSCOPY     COLONOSCOPY WITH PROPOFOL N/A 05/19/2016   Procedure: COLONOSCOPY WITH PROPOFOL;  Surgeon: Kieth Brightly, MD;  Location: [facility] ENDOSCOPY;  Service: Endoscopy;  Laterality: N/A;    No Known Allergies  Prior to Admission medications   Medication Sig Start Date End Date Taking? Authorizing Provider  acetaminophen (TYLENOL) 325 MG tablet Take 650 mg by mouth every 6 (six) hours as needed.   Yes [provider]  aspirin EC 81 MG tablet Take 1 tablet (81 mg total) by mouth daily. 07/12/20  Yes Arnetha Courser, MD  atorvastatin (LIPITOR) 40 MG tablet Take 40 mg by mouth every morning. 02/24/20  Yes [provider]  Ferrous Sulfate (SLOW FE) 142 (45 Fe) MG TBCR Take 1 tablet by mouth daily.   Yes [provider]  insulin aspart (NOVOLOG) 100 UNIT/ML injection Inject 4-12 Units into the skin as directed. Sliding scale 150-200 = 4 units 201-250 = 6 units 251-300 = 8  units 301-350 = 10 units 351-400 = 12 units   Yes [provider]  torsemide (DEMADEX) 100 MG tablet Take 1 tablet (100 mg total) by mouth daily. Patient taking differently: Take 50 mg by mouth daily. 07/13/20  Yes Arnetha Courser, MD  amLODipine (NORVASC) 10 MG tablet Take 1 tablet (10 mg total) by mouth daily. 03/30/22 04/29/22  Sharman Cheek, MD  blood glucose meter kit and supplies Dispense based on patient and insurance preference. Use up to four times daily as directed. (FOR ICD-10 E10.9, E11.9). 06/02/20   Lurene Shadow, MD  dapagliflozin propanediol (FARXIGA) 10 MG TABS tablet Take 1 tablet (10 mg total) by mouth daily. Patient not taking: Reported on 03/13/2023 09/08/21   Delma Freeze, FNP  Finerenone (KERENDIA) 10 MG TABS Take 10 mg by mouth daily. Patient not taking: Reported on 03/13/2023    [provider]  hydrALAZINE (APRESOLINE) 25 MG tablet TAKE 1 TABLET BY MOUTH THREE TIMES A DAY Patient not taking: Reported on 03/13/2023 05/09/22   Delma Freeze, FNP  metoprolol tartrate (LOPRESSOR) 25 MG tablet Take 1 tablet (25 mg total) by mouth 2 (two) times daily. 03/30/22 04/29/22  Sharman Cheek, MD  potassium chloride (KLOR-CON) 10 MEQ tablet Take 10 mEq by mouth daily. Patient not taking: Reported on 03/13/2023    [provider]    Social History   Socioeconomic History   Marital status: Single    Spouse name: Not on file   Number of children:  Not on file   Years of education: Not on file   Highest education level: Not on file  Occupational History   Not on file  Tobacco Use   Smoking status: Former    Current packs/day: 0.00    Types: Cigarettes    Quit date: 49    Years since quitting: 48.8   Smokeless tobacco: Never  Substance and Sexual Activity   Alcohol use: No    Comment: "a beer once in awhile"   Drug use: No   Sexual activity: Not on file  Other Topics Concern   Not on file  Social History Narrative   Not on file    Social Determinants of Health   Financial Resource Strain: Not on file  Food Insecurity: No Food Insecurity (03/13/2023)   Hunger Vital Sign    Worried About Running Out of Food in the Last Year: Never true    Ran Out of Food in the Last Year: Never true  Transportation Needs: No Transportation Needs (03/13/2023)   PRAPARE - Administrator, Civil Service (Medical): No    Lack of Transportation (Non-Medical): No  Physical Activity: Not on file  Stress: Not on file  Social Connections: Not on file  Intimate Partner Violence: Not At Risk (03/13/2023)   Humiliation, Afraid, Rape, and Kick questionnaire    Fear of Current or Ex-Partner: No    Emotionally Abused: No    Physically Abused: No    Sexually Abused: No     History reviewed. No pertinent family history.  ROS: Otherwise negative unless mentioned in HPI  Physical Examination  Vitals:   03/13/23 0909 03/13/23 0910  BP: (!) 152/83   Pulse: 75 69  Resp: 18   Temp: 98.1 F (36.7 C)   SpO2: 90% 94%   Body mass index is 27.52 kg/m.  General:  WDWN in NAD Gait: Not observed HENT: WNL, normocephalic Pulmonary: normal non-labored breathing, without Rales, rhonchi,  wheezing Cardiac: regular, without  Murmurs, rubs or gallops; with carotid bruits Abdomen: Positive Bowel sounds, soft, NT/ND, no masses Skin: without rashes Vascular Exam/Pulses: Palpable upper extremity pulses, Unable to palpate lower extremity pulses due to edema.  Extremities: without ischemic changes, without Gangrene , without cellulitis; without open wounds;  Musculoskeletal: no muscle wasting or atrophy  Neurologic: A&O X 3;  No focal weakness or paresthesias are detected; speech is fluent/normal Psychiatric:  The pt has Normal affect. Lymph:  Unremarkable  CBC    Component Value Date/Time   WBC 7.1 03/12/2023 1916   RBC 3.65 (L) 03/12/2023 1916   HGB 9.5 (L) 03/12/2023 1916   HCT 32.4 (L) 03/12/2023 1916   PLT 271 03/12/2023  1916   MCV 88.8 03/12/2023 1916   MCH 26.0 03/12/2023 1916   MCHC 29.3 (L) 03/12/2023 1916   RDW 17.0 (H) 03/12/2023 1916   LYMPHSABS 1.4 03/12/2023 1916   MONOABS 0.7 03/12/2023 1916   EOSABS 0.2 03/12/2023 1916   BASOSABS 0.0 03/12/2023 1916    BMET    Component Value Date/Time   NA 143 03/13/2023 0500   K 3.9 03/13/2023 0500   CL 108 03/13/2023 0500   CO2 23 03/13/2023 0500   GLUCOSE 137 (H) 03/13/2023 0500   BUN 97 (H) 03/13/2023 0500   CREATININE 7.15 (H) 03/13/2023 0500   CALCIUM 8.5 (L) 03/13/2023 0500   GFRNONAA 8 (L) 03/13/2023 0500    COAGS: No results found for: "INR", "PROTIME"   Non-Invasive Vascular Imaging:   NONE  Statin:  Yes.   Beta Blocker:  Yes.   Aspirin:  Yes.   ACEI:  No. ARB:  No. CCB use:  Yes Other antiplatelets/anticoagulants:  No.    ASSESSMENT/PLAN: This is a 67 y.o. male with a HX of End Stage Renal Failure now requiring dialysis with a GFR of 8. Patients BUN today is 97 and therefore will need a temporary dialysis catheter placed.   I discussed in detail the procedure, benefits, risks, and complications. Patient and wife at the bedside verbalize there understanding and wish to proceed. I answered all their questions today. Patient does not need to be NPO for Procedure.   -I discussed the plan in detail with Dr Vilinda Flake MD and he agrees with the plan.    Marcie Bal [facility] 03/13/2023 2:41 PM

## 2023-03-13 NOTE — Progress Notes (Signed)
[facility]  ROUNDING NOTE   Subjective:   Victor Hernandez  is a 67 year old male with past medical conditions including diabetes, hyperlipidemia, hypertension, congestive heart failure, and chronic kidney disease stage IV.  He presents to the emergency department due to abnormal labs and has been admitted for Acute renal failure (HCC) [N17.9] ESRD (end stage renal disease) (HCC) [N18.6] ESRD needing dialysis (HCC) [N18.6, Z99.2]  Patient is known to our practice from a previous admission.  Dialysis was discussed at that time, GFR was 9, but patient stated he would like to think about it.  Patient was seen in office postdischarge but failed to follow-up after that time.  Patient does admit to not properly taking care of himself since then.  Does report uremic symptoms of loss of appetite, food distaste, fatigue, and drowsiness.  Generalized edema noted.  Patient remains on room air.  Labs on ED arrival significant for BUN 98, creatinine 7.27 with GFR 8, albumin 2.7, BNP greater than 2500, hemoglobin 9.5.  Respiratory panel negative for COVID-19.  Chest x-ray shows a mild cardiomegaly with moderate severity pulmonary vascular congestion and mild left basilar atelectasis.  We have been consulted to evaluate progression of chronic kidney disease.   Objective:  Vital signs in last 24 hours:  Temp:  [97.7 F (36.5 C)-98.1 F (36.7 C)] 97.7 F (36.5 C) (10/15 1452) Pulse Rate:  [69-102] 69 (10/15 0910) Resp:  [10-33] 20 (10/15 1452) BP: (144-194)/(83-121) 144/89 (10/15 1452) SpO2:  [86 %-100 %] 93 % (10/15 1452) Weight:  [81.6 kg-89.5 kg] 89.5 kg (10/15 0703)  Weight change:  Filed Weights   03/12/23 1913 03/13/23 0703  Weight: 81.6 kg 89.5 kg    Intake/Output: No intake/output data recorded.   Intake/Output this shift:  No intake/output data recorded.  Physical Exam: General: NAD  Head: Normocephalic, atraumatic. Moist oral mucosal membranes  Eyes: Anicteric  Lungs:   Clear to auscultation, normal effort  Heart: Regular rate and rhythm  Abdomen:  Soft, nontender,   Extremities: 3+ peripheral edema.  Neurologic: Alert and oriented, moving all four extremities  Skin: No lesions  Access: None    Basic Metabolic Panel: Recent Labs  Lab 03/12/23 1916 03/12/23 2151 03/13/23 0500  NA 143  --  143  K 4.1  --  3.9  CL 107  --  108  CO2 23  --  23  GLUCOSE 166*  --  137*  BUN 98*  --  97*  CREATININE 7.27*  --  7.15*  CALCIUM 8.4*  --  8.5*  MG  --  2.2  --     Liver Function Tests: Recent Labs  Lab 03/12/23 1916  AST 17  ALT 23  ALKPHOS 117  BILITOT 0.8  PROT 6.0*  ALBUMIN 2.7*   No results for input(s): "LIPASE", "AMYLASE" in the last 168 hours. No results for input(s): "AMMONIA" in the last 168 hours.  CBC: Recent Labs  Lab 03/12/23 1916  WBC 7.1  NEUTROABS 4.9  HGB 9.5*  HCT 32.4*  MCV 88.8  PLT 271    Cardiac Enzymes: No results for input(s): "CKTOTAL", "CKMB", "CKMBINDEX", "TROPONINI" in the last 168 hours.  BNP: Invalid input(s): "POCBNP"  CBG: Recent Labs  Lab 03/13/23 0126 03/13/23 0419 03/13/23 0807 03/13/23 1223 03/13/23 1458  GLUCAP 139* 116* 216* 192* 192*    Microbiology: Results for orders placed or performed during the hospital encounter of 03/12/23  SARS Coronavirus 2 by RT PCR (hospital order, performed in [facility] hospital lab) *cepheid single result test* Anterior Nasal Swab     Status: None   Collection Time: 03/12/23  9:52 PM   Specimen: Anterior Nasal Swab  Result Value Ref Range Status   SARS Coronavirus 2 by RT PCR NEGATIVE NEGATIVE Final    Comment: (NOTE) SARS-CoV-2 target nucleic acids are NOT DETECTED.  The SARS-CoV-2 RNA is generally detectable in upper and lower respiratory specimens during the acute phase of infection. The lowest concentration of SARS-CoV-2 viral copies this assay can detect is 250 copies / mL. A negative result does not preclude SARS-CoV-2 infection and  should not be used as the sole basis for treatment or other patient management decisions.  A negative result may occur with improper specimen collection / handling, submission of specimen other than nasopharyngeal swab, presence of viral mutation(s) within the areas targeted by this assay, and inadequate number of viral copies (<250 copies / mL). A negative result must be combined with clinical observations, patient history, and epidemiological information.  Fact Sheet for Patients:   RoadLapTop.co.za  Fact Sheet for Healthcare Providers: http://kim-miller.com/  This test is not yet approved or  cleared by the [place] FDA and has been authorized for detection and/or diagnosis of SARS-CoV-2 by FDA under an Emergency Use Authorization (EUA).  This EUA will remain in effect (meaning this test can be used) for the duration of the COVID-19 declaration under Section 564(b)(1) of the Act, 21 U.S.C. section 360bbb-3(b)(1), unless the authorization is terminated or revoked sooner.  Performed at [facility], [address] [place], [place] [address]     Coagulation Studies: No results for input(s): "LABPROT", "INR" in the last 72 hours.  Urinalysis: No results for input(s): "COLORURINE", "LABSPEC", "PHURINE", "GLUCOSEU", "HGBUR", "BILIRUBINUR", "KETONESUR", "PROTEINUR", "UROBILINOGEN", "NITRITE", "LEUKOCYTESUR" in the last 72 hours.  Invalid input(s): "APPERANCEUR"    Imaging: DG Chest 2 View  Result Date: 03/13/2023 CLINICAL DATA:  Shortness of breath. EXAM: CHEST - 2 VIEW COMPARISON:  February 20, 2023 FINDINGS: Cardiac silhouette is mildly enlarged and unchanged in size. There is stable moderate severity prominence of the perihilar pulmonary vasculature. Mild, stable atelectasis is seen within the left lung base. No pleural effusion or pneumothorax is identified. The visualized skeletal structures are unremarkable.  IMPRESSION: 1. Stable mild cardiomegaly and moderate severity pulmonary vascular congestion. 2. Mild left basilar atelectasis. Electronically Signed   By: Aram Candela M.D.   On: 03/13/2023 00:23     Medications:     [MAR Hold] amLODipine  10 mg Oral Daily   [MAR Hold] aspirin EC  81 mg Oral Daily   [MAR Hold] atorvastatin  40 mg Oral q morning   [MAR Hold] Chlorhexidine Gluconate Cloth  6 each Topical Q0600   [MAR Hold] furosemide  40 mg Intravenous Q12H   [MAR Hold] heparin  5,000 Units Subcutaneous Q8H   [MAR Hold] insulin aspart  0-15 Units Subcutaneous Q4H   [MAR Hold] metoprolol tartrate  25 mg Oral BID   [MAR Hold] acetaminophen **OR** [MAR Hold] acetaminophen, [MAR Hold] hydrALAZINE, [MAR Hold] ondansetron **OR** [MAR Hold] ondansetron (ZOFRAN) IV, [MAR Hold] mouth rinse  Assessment/ Plan:  Victor Hernandez is a 67 y.o.  male with past medical conditions including diabetes, hyperlipidemia, hypertension, congestive heart failure, and chronic kidney disease stage IV.  He presents to the emergency department due to abnormal labs and has been admitted for Acute renal failure (HCC) [N17.9] ESRD (end stage renal disease) (HCC) [N18.6] ESRD needing dialysis (HCC) [N18.6,  Z99.2]   End-stage renal disease requiring hemodialysis.  Due to persistent decline of renal function and presence of uremic symptoms, we feel this patient has progressed to end-stage renal disease requiring renal replacement therapy.  Uremic symptoms including food distaste, lack of appetite, drowsiness, and fatigue.  Discussed this condition with patient in detail, answered questions and patient agreeable to proceed with hemodialysis.  Vascular consulted for placement of HD temp cath with PermCath placement later this week.  Long-term plan includes PD catheter placement with transition to peritoneal dialysis.  Renal navigator informed of patient and will begin seeking outpatient clinic tomorrow.  2. Anemia of chronic  kidney disease Lab Results  Component Value Date   HGB 9.5 (L) 03/12/2023    Hemoglobin within desired range.  May consider low-dose EPO later this week.  3. Secondary Hyperparathyroidism: with outpatient labs: PTH 1123, phosphorus 6.8, calcium 9.2 on 04/17/22.   Lab Results  Component Value Date   CALCIUM 8.5 (L) 03/13/2023    Calcium acceptable.  Will continue to monitor bone minerals during this admission.  4.  Hypertension with chronic kidney disease.  Outpatient regimen includes amlodipine, hydralazine, metoprolol, and torsemide.  Receiving these medications except torsemide.  Currently receiving IV furosemide 40 mg twice daily.  5. Diabetes mellitus type II with chronic kidney disease/renal manifestations: noninsulin dependent. Home regimen includes [place]. Most recent hemoglobin A1c is 8.5 on 03/13/2023.     LOS: 1   10/15/20243:17 PM

## 2023-03-13 NOTE — Interval H&P Note (Signed)
History and Physical Interval Note:  03/13/2023 3:34 PM  Victor Hernandez  has presented today for surgery, with the diagnosis of ESRD.  The various methods of treatment have been discussed with the patient and family. After consideration of risks, benefits and other options for treatment, the patient has consented to  Procedure(s): TEMPORARY DIALYSIS CATHETER (N/A) as a surgical intervention.  The patient's history has been reviewed, patient examined, no change in status, stable for surgery.  I have reviewed the patient's chart and labs.  Questions were answered to the patient's satisfaction.     Levora Dredge

## 2023-03-13 NOTE — Consult Note (Signed)
Hospital Consult    Reason for Consult:  Temporary dialysis Catheter placement.  Requesting Physician:  Malachi Carl NP  MRN #:  [id number]  History of Present Illness: This is a 67 y.o. male with medical history significant for hypertension, insulin-dependent type 2 diabetes mellitus, HFpEF, severe mitral regurg on echo 2022, CKD stage IV-V, sent in by PCP for abnormal blood work, specifically worsening renal function.  Patient had been reporting increasing lower extremity edema dyspnea on exertion and orthopnea.  He denied chest pain, fever or chill.  No headache lethargy or change in mentation. On work up he was found to have a BNP of 2518 and A GFR of 8. Vascular Surgery Consulted for placement of Temporary dialysis catheter.   Past Medical History:  Diagnosis Date   CHF (congestive heart failure) (HCC)    Chronic kidney disease    Diabetes mellitus without complication (HCC)    Hyperlipidemia    Hypertension     Past Surgical History:  Procedure Laterality Date   COLONOSCOPY     COLONOSCOPY WITH PROPOFOL N/A 05/19/2016   Procedure: COLONOSCOPY WITH PROPOFOL;  Surgeon: Kieth Brightly, MD;  Location: [facility] ENDOSCOPY;  Service: Endoscopy;  Laterality: N/A;    No Known Allergies  Prior to Admission medications   Medication Sig Start Date End Date Taking? Authorizing Provider  acetaminophen (TYLENOL) 325 MG tablet Take 650 mg by mouth every 6 (six) hours as needed.   Yes [provider]  aspirin EC 81 MG tablet Take 1 tablet (81 mg total) by mouth daily. 07/12/20  Yes Arnetha Courser, MD  atorvastatin (LIPITOR) 40 MG tablet Take 40 mg by mouth every morning. 02/24/20  Yes [provider]  Ferrous Sulfate (SLOW FE) 142 (45 Fe) MG TBCR Take 1 tablet by mouth daily.   Yes [provider]  insulin aspart (NOVOLOG) 100 UNIT/ML injection Inject 4-12 Units into the skin as directed. Sliding scale 150-200 = 4 units 201-250 = 6 units 251-300 = 8  units 301-350 = 10 units 351-400 = 12 units   Yes [provider]  torsemide (DEMADEX) 100 MG tablet Take 1 tablet (100 mg total) by mouth daily. Patient taking differently: Take 50 mg by mouth daily. 07/13/20  Yes Arnetha Courser, MD  amLODipine (NORVASC) 10 MG tablet Take 1 tablet (10 mg total) by mouth daily. 03/30/22 04/29/22  Sharman Cheek, MD  blood glucose meter kit and supplies Dispense based on patient and insurance preference. Use up to four times daily as directed. (FOR ICD-10 E10.9, E11.9). 06/02/20   Lurene Shadow, MD  dapagliflozin propanediol (FARXIGA) 10 MG TABS tablet Take 1 tablet (10 mg total) by mouth daily. Patient not taking: Reported on 03/13/2023 09/08/21   Delma Freeze, FNP  Finerenone (KERENDIA) 10 MG TABS Take 10 mg by mouth daily. Patient not taking: Reported on 03/13/2023    [provider]  hydrALAZINE (APRESOLINE) 25 MG tablet TAKE 1 TABLET BY MOUTH THREE TIMES A DAY Patient not taking: Reported on 03/13/2023 05/09/22   Delma Freeze, FNP  metoprolol tartrate (LOPRESSOR) 25 MG tablet Take 1 tablet (25 mg total) by mouth 2 (two) times daily. 03/30/22 04/29/22  Sharman Cheek, MD  potassium chloride (KLOR-CON) 10 MEQ tablet Take 10 mEq by mouth daily. Patient not taking: Reported on 03/13/2023    [provider]    Social History   Socioeconomic History   Marital status: Single    Spouse name: Not on file   Number of children:  Not on file   Years of education: Not on file   Highest education level: Not on file  Occupational History   Not on file  Tobacco Use   Smoking status: Former    Current packs/day: 0.00    Types: Cigarettes    Quit date: 49    Years since quitting: 48.8   Smokeless tobacco: Never  Substance and Sexual Activity   Alcohol use: No    Comment: "a beer once in awhile"   Drug use: No   Sexual activity: Not on file  Other Topics Concern   Not on file  Social History Narrative   Not on file    Social Determinants of Health   Financial Resource Strain: Not on file  Food Insecurity: No Food Insecurity (03/13/2023)   Hunger Vital Sign    Worried About Running Out of Food in the Last Year: Never true    Ran Out of Food in the Last Year: Never true  Transportation Needs: No Transportation Needs (03/13/2023)   PRAPARE - Administrator, Civil Service (Medical): No    Lack of Transportation (Non-Medical): No  Physical Activity: Not on file  Stress: Not on file  Social Connections: Not on file  Intimate Partner Violence: Not At Risk (03/13/2023)   Humiliation, Afraid, Rape, and Kick questionnaire    Fear of Current or Ex-Partner: No    Emotionally Abused: No    Physically Abused: No    Sexually Abused: No     History reviewed. No pertinent family history.  ROS: Otherwise negative unless mentioned in HPI  Physical Examination  Vitals:   03/13/23 0909 03/13/23 0910  BP: (!) 152/83   Pulse: 75 69  Resp: 18   Temp: 98.1 F (36.7 C)   SpO2: 90% 94%   Body mass index is 27.52 kg/m.  General:  WDWN in NAD Gait: Not observed HENT: WNL, normocephalic Pulmonary: normal non-labored breathing, without Rales, rhonchi,  wheezing Cardiac: regular, without  Murmurs, rubs or gallops; with carotid bruits Abdomen: Positive Bowel sounds, soft, NT/ND, no masses Skin: without rashes Vascular Exam/Pulses: Palpable upper extremity pulses, Unable to palpate lower extremity pulses due to edema.  Extremities: without ischemic changes, without Gangrene , without cellulitis; without open wounds;  Musculoskeletal: no muscle wasting or atrophy  Neurologic: A&O X 3;  No focal weakness or paresthesias are detected; speech is fluent/normal Psychiatric:  The pt has Normal affect. Lymph:  Unremarkable  CBC    Component Value Date/Time   WBC 7.1 03/12/2023 1916   RBC 3.65 (L) 03/12/2023 1916   HGB 9.5 (L) 03/12/2023 1916   HCT 32.4 (L) 03/12/2023 1916   PLT 271 03/12/2023  1916   MCV 88.8 03/12/2023 1916   MCH 26.0 03/12/2023 1916   MCHC 29.3 (L) 03/12/2023 1916   RDW 17.0 (H) 03/12/2023 1916   LYMPHSABS 1.4 03/12/2023 1916   MONOABS 0.7 03/12/2023 1916   EOSABS 0.2 03/12/2023 1916   BASOSABS 0.0 03/12/2023 1916    BMET    Component Value Date/Time   NA 143 03/13/2023 0500   K 3.9 03/13/2023 0500   CL 108 03/13/2023 0500   CO2 23 03/13/2023 0500   GLUCOSE 137 (H) 03/13/2023 0500   BUN 97 (H) 03/13/2023 0500   CREATININE 7.15 (H) 03/13/2023 0500   CALCIUM 8.5 (L) 03/13/2023 0500   GFRNONAA 8 (L) 03/13/2023 0500    COAGS: No results found for: "INR", "PROTIME"   Non-Invasive Vascular Imaging:   NONE  Statin:  Yes.   Beta Blocker:  Yes.   Aspirin:  Yes.   ACEI:  No. ARB:  No. CCB use:  Yes Other antiplatelets/anticoagulants:  No.    ASSESSMENT/PLAN: This is a 67 y.o. male with a HX of End Stage Renal Failure now requiring dialysis with a GFR of 8. Patients BUN today is 97 and therefore will need a temporary dialysis catheter placed.   I discussed in detail the procedure, benefits, risks, and complications. Patient and wife at the bedside verbalize there understanding and wish to proceed. I answered all their questions today. Patient does not need to be NPO for Procedure.   -I discussed the plan in detail with Dr Vilinda Flake MD and he agrees with the plan.    Marcie Bal [facility] 03/13/2023 2:41 PM

## 2023-03-13 NOTE — Assessment & Plan Note (Addendum)
Hypertensive emergency Acute respiratory failure with hypoxia Secondary to fluid overload and likely need for dialysis EF 50 to 55% with LVH 05/2020 IV Lasix, IV hydralazine for BP control Daily weights with intake and output monitoring Echocardiogram Expecting further improvement if dialysis is initiated

## 2023-03-13 NOTE — Assessment & Plan Note (Signed)
Sliding scale insulin coverage 

## 2023-03-13 NOTE — Plan of Care (Signed)
°  Problem: Education: °Goal: Ability to demonstrate management of disease process will improve °Outcome: Progressing °Goal: Ability to verbalize understanding of medication therapies will improve °Outcome: Progressing °Goal: Individualized Educational Video(s) °Outcome: Progressing °  °

## 2023-03-13 NOTE — Progress Notes (Signed)
Patient returned from vascular lab

## 2023-03-13 NOTE — Op Note (Signed)
  OPERATIVE NOTE   PROCEDURE: Insertion of temporary dialysis catheter catheter right common femoral approach.  PRE-OPERATIVE DIAGNOSIS: Acute on chronic renal insufficiency requiring hemodialysis  POST-OPERATIVE DIAGNOSIS: Acute on chronic renal insufficiency requiring hemodialysis  SURGEON: Renford Dills M.D.  ANESTHESIA: 1% lidocaine local infiltration  ESTIMATED BLOOD LOSS: Minimal cc  INDICATIONS:   Victor Hernandez is a 67 y.o. male who presents with acute on chronic renal insufficiency.  This is progressed to the point that he must now start hemodialysis and therefore a temporary catheter is recommended.  Risks and benefits of been reviewed patient agrees to proceed with catheter placement.  DESCRIPTION: After obtaining full informed written consent, the patient was positioned supine. The right groin was prepped and draped in a sterile fashion. Ultrasound was placed in a sterile sleeve. Ultrasound was utilized to identify the right common femoral vein which is noted to be echolucent and compressible indicating patency. Images recorded for the permanent record. Under real-time visualization a Seldinger needle is inserted into the vein and the guidewires advanced without difficulty. Small counterincision was made at the wire insertion site. Dilator is passed over the wire and the temporary dialysis catheter catheter is fed over the wire without difficulty.  All lumens aspirate and flush easily and are packed with heparin saline. Catheter secured to the skin of the right thigh with 2-0 silk. A sterile dressing is applied with Biopatch.  COMPLICATIONS: None  CONDITION: Unchanged  Levora Dredge Office:  [phone] 03/13/2023, 6:17 PM

## 2023-03-13 NOTE — Progress Notes (Signed)
PROGRESS NOTE  Victor Hernandez    DOB: August 19, 1955, 67 y.o.  ZOX:[id number]    Code Status: Full Code   DOA: 03/12/2023   LOS: 1   Brief hospital course  Victor Hernandez is a 67 y.o. male with medical history significant for hypertension, insulin-dependent type 2 diabetes mellitus, HFpEF, severe mitral regurg on echo 2022, CKD stage IV-V.  Presented to ED from PCP for abnormal blood work, specifically worsening renal function.  Patient had been reporting increasing lower extremity edema dyspnea on exertion and orthopnea.   ED Course: BP 194/119 on arrival with pulse in the 90s and low 100s and tachypneic at times to 33 with O2 sat intermittently dipping to as low as 86% on room air. Labs: BNP 2518,WBC, hgb 9.5 which appears to be his baseline CMP: Cr 7.28 with normal bicarb of 23 and potassium 4.1, GFR 8 EKG not done Chest x-ray: pulmonary vascular congestion Patient was treated with IV labetalol oral hydralazine and metoprolol and also given a dose of IV Lasix.   Nephrology was consulted   Patient was admitted to medicine service for further workup and management of hypertensive emergency with pulmonary edema and acute renal failure as outlined in detail below.  03/13/23 -improving. Vascular consulted for temp cath placement. Pt has self-weaned from the O2  Assessment & Plan  Principal Problem:   Acute on chronic diastolic CHF  (HCC) Active Problems:   Acute respiratory failure with hypoxia (HCC)   ESRD needing dialysis (HCC)   Diabetes mellitus, type II (HCC)   Hypertensive emergency   Anemia of chronic kidney failure, stage 5 (HCC)  Hypertensive emergency- 190 systolics on arrival - continue home amlodipine and hydralazine. Prefer to adjust long-term meds rather than to utilize PRNs intermittently. Will monitor BP and adjust medications as needed. For now, better controlled to 150s systolics.  - monitor for fluctuation with initiation of HD  Acute respiratory failure with hypoxia,  POA- resolved. secondary to vascular congestion (multifactorial with severe range HTN and worsening renal function). No oxygen use at baseline. originally on 2L [place] and he has self-weaned himself to room air. Denies respiratory distress, SOB with ambulating to the bathroom - continue IV lasix. Endorses good UOP - f/u echo - proceed with renal treatments   ESRD needing dialysis (HCC)- Cr greatly worsened and having hypervolemia with end-organ failure. Endorses 3 voids today which was not recorded. Endorses improvement in his LE edema since admission - continue lasix Nephrology consulted and recommends initiation of HD.  Vascular consulted- planning temp cath placement today - strict I/O - BMP am    Acute on chronic diastolic CHF  (HCC)- echo read pending - continue home metoprolol   Diabetes mellitus, type II (HCC) Sliding scale insulin coverage   Anemia of chronic kidney failure, stage 5 (HCC) Appears at baseline - CBC am  Body mass index is 25.1 kg/m.  VTE ppx: heparin injection 5,000 Units Start: 03/13/23 0200  Diet:     Diet   Diet heart healthy/carb modified Room service appropriate? Yes; Fluid consistency: Thin   Consultants: Nephrology  Vascular surgery   Subjective 03/13/23    Pt reports feeling well. LE swelling has improved. Denies SOB, CP. He is agreeable to starting HD. His new wife is sitting next to him. They've been married for about a month.    Objective   Vitals:   03/13/23 0255 03/13/23 0414 03/13/23 0500 03/13/23 0802  BP:   (!) 160/115 (!) 159/104  Pulse:  86 90  Resp:   18 17  Temp:  97.9 F (36.6 C)    TempSrc:  Oral    SpO2: 94%  98% 100%  Weight:      Height:       No intake or output data in the 24 hours ending 03/13/23 0816 Filed Weights   03/12/23 1913  Weight: 81.6 kg    Physical Exam:  General: awake, alert, NAD HEENT: atraumatic, clear conjunctiva, anicteric sclera, MMM, hearing grossly normal Respiratory: normal respiratory  effort. CTAB Cardiovascular: quick capillary refill, normal S1/S2, RRR, no JVD, murmurs Gastrointestinal: soft, NT, ND Nervous: A&O x3. no gross focal neurologic deficits, normal speech Extremities: 2+ pitting edema to thighs Skin: dry, intact, normal temperature, normal color. No rashes, lesions or ulcers on exposed skin Psychiatry: normal mood, congruent affect  Labs   I have personally reviewed the following labs and imaging studies CBC    Component Value Date/Time   WBC 7.1 03/12/2023 1916   RBC 3.65 (L) 03/12/2023 1916   HGB 9.5 (L) 03/12/2023 1916   HCT 32.4 (L) 03/12/2023 1916   PLT 271 03/12/2023 1916   MCV 88.8 03/12/2023 1916   MCH 26.0 03/12/2023 1916   MCHC 29.3 (L) 03/12/2023 1916   RDW 17.0 (H) 03/12/2023 1916   LYMPHSABS 1.4 03/12/2023 1916   MONOABS 0.7 03/12/2023 1916   EOSABS 0.2 03/12/2023 1916   BASOSABS 0.0 03/12/2023 1916      Latest Ref Rng & Units 03/13/2023    5:00 AM 03/12/2023    7:16 PM 02/20/2023    2:05 PM  BMP  Glucose 70 - 99 mg/dL 409  811  914   BUN 8 - 23 mg/dL 97  98  73   Creatinine 0.61 - 1.24 mg/dL 7.82  9.56  2.13   Sodium 135 - 145 mmol/L 143  143  144   Potassium 3.5 - 5.1 mmol/L 3.9  4.1  4.2   Chloride 98 - 111 mmol/L 108  107  108   CO2 22 - 32 mmol/L 23  23  23    Calcium 8.9 - 10.3 mg/dL 8.5  8.4  8.1     DG Chest 2 View  Result Date: 03/13/2023 CLINICAL DATA:  Shortness of breath. EXAM: CHEST - 2 VIEW COMPARISON:  February 20, 2023 FINDINGS: Cardiac silhouette is mildly enlarged and unchanged in size. There is stable moderate severity prominence of the perihilar pulmonary vasculature. Mild, stable atelectasis is seen within the left lung base. No pleural effusion or pneumothorax is identified. The visualized skeletal structures are unremarkable. IMPRESSION: 1. Stable mild cardiomegaly and moderate severity pulmonary vascular congestion. 2. Mild left basilar atelectasis. Electronically Signed   By: Aram Candela M.D.    On: 03/13/2023 00:23    Disposition Plan & Communication  Patient status: Inpatient  Admitted From: Home Planned disposition location: Home Anticipated discharge date: 10/20 pending renal workup complete- will need outpatient HD set up after dc which will prolong hospital stay  Family Communication: wife at bedside    Author: Leeroy Bock, DO [facility] 03/13/2023, 8:16 AM   Available by Epic secure chat 7AM-7PM. If 7PM-7AM, please contact night-coverage.  [facility] contact information found on [email or web address].

## 2023-03-13 NOTE — Assessment & Plan Note (Addendum)
Bicarb and potassium normal Nephrology consulted from ED--keeping n.p.o. per urology

## 2023-03-13 NOTE — Assessment & Plan Note (Signed)
Appears at baseline.

## 2023-03-14 ENCOUNTER — Encounter: Payer: Self-pay | Admitting: Vascular Surgery

## 2023-03-14 DIAGNOSIS — I5033 Acute on chronic diastolic (congestive) heart failure: Secondary | ICD-10-CM | POA: Diagnosis not present

## 2023-03-14 LAB — GLUCOSE, CAPILLARY
Glucose-Capillary: 153 mg/dL — ABNORMAL HIGH (ref 70–99)
Glucose-Capillary: 159 mg/dL — ABNORMAL HIGH (ref 70–99)
Glucose-Capillary: 190 mg/dL — ABNORMAL HIGH (ref 70–99)
Glucose-Capillary: 195 mg/dL — ABNORMAL HIGH (ref 70–99)
Glucose-Capillary: 294 mg/dL — ABNORMAL HIGH (ref 70–99)
Glucose-Capillary: 54 mg/dL — ABNORMAL LOW (ref 70–99)
Glucose-Capillary: 69 mg/dL — ABNORMAL LOW (ref 70–99)
Glucose-Capillary: 77 mg/dL (ref 70–99)

## 2023-03-14 LAB — RENAL FUNCTION PANEL
Albumin: 2.5 g/dL — ABNORMAL LOW (ref 3.5–5.0)
Anion gap: 12 (ref 5–15)
BUN: 98 mg/dL — ABNORMAL HIGH (ref 8–23)
CO2: 24 mmol/L (ref 22–32)
Calcium: 8 mg/dL — ABNORMAL LOW (ref 8.9–10.3)
Chloride: 109 mmol/L (ref 98–111)
Creatinine, Ser: 7.53 mg/dL — ABNORMAL HIGH (ref 0.61–1.24)
GFR, Estimated: 7 mL/min — ABNORMAL LOW (ref >60)
Glucose, Bld: 113 mg/dL — ABNORMAL HIGH (ref 70–99)
Phosphorus: 7.3 mg/dL — ABNORMAL HIGH (ref 2.5–4.6)
Potassium: 4.3 mmol/L (ref 3.5–5.1)
Sodium: 145 mmol/L (ref 135–145)

## 2023-03-14 LAB — CBC
HCT: 31.6 % — ABNORMAL LOW (ref 39.0–52.0)
Hemoglobin: 9.4 g/dL — ABNORMAL LOW (ref 13.0–17.0)
MCH: 26.6 pg (ref 26.0–34.0)
MCHC: 29.7 g/dL — ABNORMAL LOW (ref 30.0–36.0)
MCV: 89.3 fL (ref 80.0–100.0)
Platelets: 268 10*3/uL (ref 150–400)
RBC: 3.54 MIL/uL — ABNORMAL LOW (ref 4.22–5.81)
RDW: 17.1 % — ABNORMAL HIGH (ref 11.5–15.5)
WBC: 6.4 10*3/uL (ref 4.0–10.5)
nRBC: 0 % (ref 0.0–0.2)

## 2023-03-14 LAB — HEPATITIS B CORE ANTIBODY, TOTAL: Hep B Core Total Ab: NONREACTIVE

## 2023-03-14 LAB — HEPATITIS B SURFACE ANTIGEN: Hepatitis B Surface Ag: NONREACTIVE

## 2023-03-14 MED ORDER — NEPRO/CARBSTEADY PO LIQD
237.0000 mL | Freq: Three times a day (TID) | ORAL | Status: DC
  Administered 2023-03-14 – 2023-03-19 (×6): 237 mL via ORAL

## 2023-03-14 MED ORDER — RENA-VITE PO TABS
1.0000 | ORAL_TABLET | Freq: Every day | ORAL | Status: DC
  Administered 2023-03-14: 1
  Filled 2023-03-14: qty 1

## 2023-03-14 MED ORDER — CALCIUM ACETATE (PHOS BINDER) 667 MG PO CAPS
1334.0000 mg | ORAL_CAPSULE | Freq: Three times a day (TID) | ORAL | Status: DC
  Administered 2023-03-14 – 2023-03-19 (×12): 1334 mg via ORAL
  Filled 2023-03-14 (×12): qty 2

## 2023-03-14 MED ORDER — ALTEPLASE 2 MG IJ SOLR: 2.0000 mg | Freq: Once | INTRAMUSCULAR | Status: DC | PRN

## 2023-03-14 MED ORDER — HEPARIN SODIUM (PORCINE) 1000 UNIT/ML DIALYSIS: 1000.0000 [IU] | INTRAMUSCULAR | Status: DC | PRN

## 2023-03-14 MED ORDER — INSULIN ASPART 100 UNIT/ML IJ SOLN
0.0000 [IU] | INTRAMUSCULAR | Status: DC
  Administered 2023-03-14: 5 [IU] via SUBCUTANEOUS
  Administered 2023-03-14 – 2023-03-15 (×4): 2 [IU] via SUBCUTANEOUS
  Administered 2023-03-16: 5 [IU] via SUBCUTANEOUS
  Administered 2023-03-16: 2 [IU] via SUBCUTANEOUS
  Administered 2023-03-16 (×2): 3 [IU] via SUBCUTANEOUS
  Administered 2023-03-17 – 2023-03-18 (×4): 2 [IU] via SUBCUTANEOUS
  Administered 2023-03-18: 1 [IU] via SUBCUTANEOUS
  Administered 2023-03-18: 5 [IU] via SUBCUTANEOUS
  Administered 2023-03-18: 2 [IU] via SUBCUTANEOUS
  Administered 2023-03-18 (×2): 1 [IU] via SUBCUTANEOUS
  Administered 2023-03-19: 2 [IU] via SUBCUTANEOUS
  Administered 2023-03-19: 5 [IU] via SUBCUTANEOUS
  Administered 2023-03-20: 2 [IU] via SUBCUTANEOUS
  Filled 2023-03-14 (×21): qty 1

## 2023-03-14 MED ORDER — RENA-VITE PO TABS
1.0000 | ORAL_TABLET | Freq: Every day | ORAL | Status: DC
  Administered 2023-03-15 – 2023-03-19 (×5): 1 via ORAL
  Filled 2023-03-14 (×5): qty 1

## 2023-03-14 MED ORDER — TORSEMIDE 20 MG PO TABS
100.0000 mg | ORAL_TABLET | Freq: Every day | ORAL | Status: DC
  Administered 2023-03-15 – 2023-03-20 (×4): 100 mg via ORAL
  Filled 2023-03-14 (×5): qty 5

## 2023-03-14 MED ORDER — CALCITRIOL 0.25 MCG PO CAPS
0.2500 ug | ORAL_CAPSULE | Freq: Every day | ORAL | Status: DC
  Administered 2023-03-14 – 2023-03-20 (×5): 0.25 ug via ORAL
  Filled 2023-03-14 (×7): qty 1

## 2023-03-14 MED ORDER — LIDOCAINE HCL (PF) 1 % IJ SOLN
INTRAMUSCULAR | Status: DC | PRN
  Administered 2023-03-13: 20 mL

## 2023-03-14 NOTE — Inpatient Diabetes Management (Signed)
Inpatient Diabetes Program Recommendations  AACE/ADA: New Consensus Statement on Inpatient Glycemic Control  Target Ranges:  Prepandial:   less than 140 mg/dL      Peak postprandial:   less than 180 mg/dL (1-2 hours)      Critically ill patients:  140 - 180 mg/dL    Latest Reference Range & Units 03/14/23 00:28 03/14/23 04:06 03/14/23 04:16 03/14/23 04:36  Glucose-Capillary 70 - 99 mg/dL 829 (H) 54 (L) 69 (L) 77    Latest Reference Range & Units 03/13/23 04:19 03/13/23 08:07 03/13/23 12:23 03/13/23 14:58 03/13/23 21:10  Glucose-Capillary 70 - 99 mg/dL 562 (H) 130 (H) 865 (H) 192 (H) 212 (H)   Review of Glycemic Control  Diabetes history: DM2 Outpatient Diabetes medications: Farxiga 10 mg daily, Novolog 4-12 units sliding scale Current orders for Inpatient glycemic control: Novolog 0-15 units Q4H  Inpatient Diabetes Program Recommendations:    Insulin: CBG 54 mg/dl at 7:84 am today. May want to consider decreasing Novolog correction to 0-9 units Q4H.  Thanks, Orlando Penner, RN, MSN, CDCES Diabetes Coordinator Inpatient Diabetes Program [phone] (Team Pager from 8am to 5pm)

## 2023-03-14 NOTE — Progress Notes (Signed)
  Progress Note    03/14/2023 1:06 PM 1 Day Post-Op  Subjective: Victor Hernandez  is a 67 y.o. male with medical history significant for hypertension, insulin-dependent type 2 diabetes mellitus, HFpEF, severe mitral regurg on echo 2022, CKD stage IV-V, sent in by PCP for abnormal blood work, specifically worsening renal function.   Patient is now POD #1 from temporary dialysis access for hemodialysis. No complications to note. Vitals all remain stable.    Vitals:   03/14/23 1028 03/14/23 1113  BP: (!) 153/86 (!) 152/90  Pulse: 89 89  Resp: (!) 25 20  Temp: (!) 97.5 F (36.4 C) 97.6 F (36.4 C)  SpO2: 100% 100%   Physical Exam: Cardiac:  RRR, normal S1 and S2.  No murmurs appreciated. Lungs: Clear on auscultation throughout, nonlabored without rales rhonchi or wheezing. Incisions: Right groin temporary dialysis catheter insertion.  Without complications. Extremities: All extremities with good ROM, strength and reflexes.  All pulses palpable. Abdomen: Positive bowel sounds, soft, nontender and distended. Neurologic: Alert and oriented x 3, answers all questions and follows commands appropriately.  CBC    Component Value Date/Time   WBC 6.4 03/14/2023 0531   RBC 3.54 (L) 03/14/2023 0531   HGB 9.4 (L) 03/14/2023 0531   HCT 31.6 (L) 03/14/2023 0531   PLT 268 03/14/2023 0531   MCV 89.3 03/14/2023 0531   MCH 26.6 03/14/2023 0531   MCHC 29.7 (L) 03/14/2023 0531   RDW 17.1 (H) 03/14/2023 0531   LYMPHSABS 1.4 03/12/2023 1916   MONOABS 0.7 03/12/2023 1916   EOSABS 0.2 03/12/2023 1916   BASOSABS 0.0 03/12/2023 1916    BMET    Component Value Date/Time   NA 145 03/14/2023 0531   K 4.3 03/14/2023 0531   CL 109 03/14/2023 0531   CO2 24 03/14/2023 0531   GLUCOSE 113 (H) 03/14/2023 0531   BUN 98 (H) 03/14/2023 0531   CREATININE 7.53 (H) 03/14/2023 0531   CALCIUM 8.0 (L) 03/14/2023 0531   GFRNONAA 7 (L) 03/14/2023 0531    INR No results found for: "INR"   Intake/Output  Summary (Last 24 hours) at 03/14/2023 1306 Last data filed at 03/14/2023 1028 Gross per 24 hour  Intake --  Output 0 ml  Net 0 ml     Assessment/Plan:  67 y.o. male is s/p temporary dialysis catheter access.  1 Day Post-Op   PLAN: Temporary dialysis access catheter working well for dialysis. Please contact vascular surgery if the patient may need a dialysis permacath placement for continued hemodialysis.  DVT prophylaxis: Heparin with dialysis.   Marcie Bal [facility] 03/14/2023 1:06 PM

## 2023-03-14 NOTE — Progress Notes (Signed)
[facility]  ROUNDING NOTE   Subjective:   Victor Hernandez  is a 67 year old male with past medical conditions including diabetes, hyperlipidemia, hypertension, congestive heart failure, and chronic kidney disease stage IV.  He presents to the emergency department due to abnormal labs and has been admitted for Acute renal failure (HCC) [N17.9] ESRD (end stage renal disease) (HCC) [N18.6] ESRD needing dialysis (HCC) [N18.6, Z99.2]  Patient is known to our practice from a previous admission.  Dialysis was discussed at that time, GFR was 9, but patient stated he would like to think about it.  Patient was seen in office postdischarge but failed to follow-up after that time.  Patient does admit to not properly taking care of himself since then.  Does report uremic symptoms of loss of appetite, food distaste, fatigue, and drowsiness.  Generalized edema noted.  Patient remains on room air.  Labs on ED arrival significant for BUN 98, creatinine 7.27 with GFR 8, albumin 2.7, BNP greater than 2500, hemoglobin 9.5.  Respiratory panel negative for COVID-19.  Chest x-ray shows a mild cardiomegaly with moderate severity pulmonary vascular congestion and mild left basilar atelectasis.  Patient started on chronic hemodialysis this admission.  Tolerated his first dialysis treatment well today.  No fluid was removed.  He denies any shortness of breath.  Appetite is starting to improve.   Objective:  Vital signs in last 24 hours:  Temp:  [97.5 F (36.4 C)-98.4 F (36.9 C)] 97.6 F (36.4 C) (10/16 1113) Pulse Rate:  [48-91] 89 (10/16 1113) Resp:  [16-27] 20 (10/16 1113) BP: (121-166)/(76-106) 152/90 (10/16 1113) SpO2:  [91 %-100 %] 100 % (10/16 1113) Weight:  [87.7 kg] 87.7 kg (10/16 0808)  Weight change: 7.847 kg Filed Weights   03/12/23 1913 03/13/23 0703 03/14/23 0808  Weight: 81.6 kg 89.5 kg 87.7 kg    Intake/Output: No intake/output data recorded.   Intake/Output this shift:  No  intake/output data recorded.  Physical Exam: General: NAD  Head: Normocephalic, atraumatic. Moist oral mucosal membranes  Eyes: Anicteric  Lungs:  Clear to auscultation, normal effort  Heart: Regular rate and rhythm  Abdomen:  Soft, nontender,   Extremities: + peripheral edema.  Neurologic: Alert and oriented, moving all four extremities  Skin: No lesions  Access: Right femoral temporary catheter    Basic Metabolic Panel: Recent Labs  Lab 03/12/23 1916 03/12/23 2151 03/13/23 0500 03/14/23 0531  NA 143  --  143 145  K 4.1  --  3.9 4.3  CL 107  --  108 109  CO2 23  --  23 24  GLUCOSE 166*  --  137* 113*  BUN 98*  --  97* 98*  CREATININE 7.27*  --  7.15* 7.53*  CALCIUM 8.4*  --  8.5* 8.0*  MG  --  2.2  --   --   PHOS  --   --   --  7.3*    Liver Function Tests: Recent Labs  Lab 03/12/23 1916 03/14/23 0531  AST 17  --   ALT 23  --   ALKPHOS 117  --   BILITOT 0.8  --   PROT 6.0*  --   ALBUMIN 2.7* 2.5*   No results for input(s): "LIPASE", "AMYLASE" in the last 168 hours. No results for input(s): "AMMONIA" in the last 168 hours.  CBC: Recent Labs  Lab 03/12/23 1916 03/14/23 0531  WBC 7.1 6.4  NEUTROABS 4.9  --   HGB 9.5* 9.4*  HCT 32.4* 31.6*  MCV 88.8 89.3  PLT 271 268    Cardiac Enzymes: No results for input(s): "CKTOTAL", "CKMB", "CKMBINDEX", "TROPONINI" in the last 168 hours.  BNP: Invalid input(s): "POCBNP"  CBG: Recent Labs  Lab 03/14/23 0028 03/14/23 0406 03/14/23 0416 03/14/23 0436 03/14/23 1104  GLUCAP 159* 54* 69* 77 153*    Microbiology: Results for orders placed or performed during the hospital encounter of 03/12/23  SARS Coronavirus 2 by RT PCR (hospital order, performed in [facility] hospital lab) *cepheid single result test* Anterior Nasal Swab     Status: None   Collection Time: 03/12/23  9:52 PM   Specimen: Anterior Nasal Swab  Result Value Ref Range Status   SARS Coronavirus 2 by RT PCR NEGATIVE NEGATIVE Final     Comment: (NOTE) SARS-CoV-2 target nucleic acids are NOT DETECTED.  The SARS-CoV-2 RNA is generally detectable in upper and lower respiratory specimens during the acute phase of infection. The lowest concentration of SARS-CoV-2 viral copies this assay can detect is 250 copies / mL. A negative result does not preclude SARS-CoV-2 infection and should not be used as the sole basis for treatment or other patient management decisions.  A negative result may occur with improper specimen collection / handling, submission of specimen other than nasopharyngeal swab, presence of viral mutation(s) within the areas targeted by this assay, and inadequate number of viral copies (<250 copies / mL). A negative result must be combined with clinical observations, patient history, and epidemiological information.  Fact Sheet for Patients:   RoadLapTop.co.za  Fact Sheet for Healthcare Providers: http://kim-miller.com/  This test is not yet approved or  cleared by the [place] FDA and has been authorized for detection and/or diagnosis of SARS-CoV-2 by FDA under an Emergency Use Authorization (EUA).  This EUA will remain in effect (meaning this test can be used) for the duration of the COVID-19 declaration under Section 564(b)(1) of the Act, 21 U.S.C. section 360bbb-3(b)(1), unless the authorization is terminated or revoked sooner.  Performed at [facility], [address] [place], [place] [address]     Coagulation Studies: No results for input(s): "LABPROT", "INR" in the last 72 hours.  Urinalysis: No results for input(s): "COLORURINE", "LABSPEC", "PHURINE", "GLUCOSEU", "HGBUR", "BILIRUBINUR", "KETONESUR", "PROTEINUR", "UROBILINOGEN", "NITRITE", "LEUKOCYTESUR" in the last 72 hours.  Invalid input(s): "APPERANCEUR"    Imaging: ECHOCARDIOGRAM COMPLETE  Result Date: 03/13/2023    ECHOCARDIOGRAM REPORT   Patient Name:   Victor Hernandez  Date of Exam: 03/13/2023 Medical Rec #:  [id number]    Height:       71.0 in Accession #:    [id number]   Weight:       197.3 lb Date of Birth:  [birth year]    BSA:          2.096 m Patient Age:    67 years     BP:           152/83 mmHg Patient Gender: M            HR:           69 bpm. Exam Location:  [facility] Procedure: 2D Echo, Cardiac Doppler, Color Doppler and Strain Analysis Indications:     CHF-acute diastolic I50.31  History:         Patient has no prior history of Echocardiogram examinations,                  most recent 06/02/2020. CHF; Risk Factors:Hypertension and  Diabetes.  Sonographer:     Cristela Blue Referring Phys:  [id number] Andris Baumann Diagnosing Phys: Yvonne Kendall MD  Sonographer Comments: Global longitudinal strain was attempted. IMPRESSIONS  1. Left ventricular ejection fraction, by estimation, is 45 to 50%. The left ventricle has mildly decreased function. The left ventricle demonstrates global hypokinesis. The left ventricular internal cavity size was mildly dilated. There is moderate left ventricular hypertrophy. Left ventricular diastolic parameters are consistent with Grade III diastolic dysfunction (restrictive). Elevated left atrial pressure. The average left ventricular global longitudinal strain is -14.4 %. The global longitudinal strain is abnormal.  2. Right ventricular systolic function is normal. The right ventricular size is normal. Mildly increased right ventricular wall thickness. There is severely elevated pulmonary artery systolic pressure. The estimated right ventricular systolic pressure is 74.3 mmHg.  3. Left atrial size was severely dilated.  4. A small pericardial effusion is present.  5. The mitral valve is abnormal. Moderate to severe mitral valve regurgitation, which may be underestimated due to jet eccentricity. Consider transesophageal echocardiogram for further evaluation.  6. Tricuspid valve regurgitation is mild to moderate.  7. The aortic valve is  tricuspid. There is mild thickening of the aortic valve. Aortic valve regurgitation is mild. Aortic valve sclerosis is present, with no evidence of aortic valve stenosis.  8. The inferior vena cava is normal in size with <50% respiratory variability, suggesting right atrial pressure of 8 mmHg. FINDINGS  Left Ventricle: Left ventricular ejection fraction, by estimation, is 45 to 50%. The left ventricle has mildly decreased function. The left ventricle demonstrates global hypokinesis. The average left ventricular global longitudinal strain is -14.4 %. The global longitudinal strain is abnormal. The left ventricular internal cavity size was mildly dilated. There is moderate left ventricular hypertrophy. Left ventricular diastolic parameters are consistent with Grade III diastolic dysfunction (restrictive). Elevated left atrial pressure. Right Ventricle: The right ventricular size is normal. Mildly increased right ventricular wall thickness. Right ventricular systolic function is normal. There is severely elevated pulmonary artery systolic pressure. The tricuspid regurgitant velocity is 4.07 m/s, and with an assumed right atrial pressure of 8 mmHg, the estimated right ventricular systolic pressure is 74.3 mmHg. Left Atrium: Left atrial size was severely dilated. Right Atrium: Right atrial size was normal in size. Pericardium: A small pericardial effusion is present. Mitral Valve: The mitral valve is abnormal. There is mild thickening of the mitral valve leaflet(s). Moderate to severe mitral valve regurgitation, with eccentric anterolaterally directed jet. No evidence of mitral valve stenosis. MV peak gradient, 16.8 mmHg. The mean mitral valve gradient is 5.0 mmHg. Tricuspid Valve: The tricuspid valve is normal in structure. Tricuspid valve regurgitation is mild to moderate. Aortic Valve: The aortic valve is tricuspid. There is mild thickening of the aortic valve. Aortic valve regurgitation is mild. Aortic valve  sclerosis is present, with no evidence of aortic valve stenosis. Aortic valve mean gradient measures 2.0 mmHg. Aortic valve peak gradient measures 4.2 mmHg. Aortic valve area, by VTI measures 2.59 cm. Pulmonic Valve: The pulmonic valve was normal in structure. Pulmonic valve regurgitation is mild. No evidence of pulmonic stenosis. Aorta: The aortic root is normal in size and structure. Pulmonary Artery: The pulmonary artery is of normal size. Venous: The inferior vena cava is normal in size with less than 50% respiratory variability, suggesting right atrial pressure of 8 mmHg. IAS/Shunts: The interatrial septum was not well visualized. Additional Comments: There is a small pleural effusion in the left lateral region.  LEFT VENTRICLE PLAX 2D  LVIDd:         5.69 cm   Diastology LVIDs:         3.89 cm   LV e' lateral:   6.64 cm/s LV PW:         1.34 cm   LV E/e' lateral: 25.8 LV IVS:        1.42 cm LVOT diam:     2.10 cm   2D Longitudinal Strain LV SV:         44        2D Strain GLS Avg:     -14.4 % LV SV Index:   21 LVOT Area:     3.46 cm  RIGHT VENTRICLE RV Basal diam:  3.60 cm RV Mid diam:    2.80 cm RV S prime:     10.30 cm/s TAPSE (M-mode): 2.0 cm LEFT ATRIUM              Index        RIGHT ATRIUM           Index LA diam:        5.90 cm  2.81 cm/m   RA Area:     16.90 cm LA Vol (A2C):   117.0 ml 55.81 ml/m  RA Volume:   39.30 ml  18.75 ml/m LA Vol (A4C):   107.0 ml 51.04 ml/m LA Biplane Vol: 122.0 ml 58.19 ml/m  AORTIC VALVE AV Area (Vmax):    2.24 cm AV Area (Vmean):   2.21 cm AV Area (VTI):     2.59 cm AV Vmax:           102.55 cm/s AV Vmean:          64.800 cm/s AV VTI:            0.171 m AV Peak Grad:      4.2 mmHg AV Mean Grad:      2.0 mmHg LVOT Vmax:         66.30 cm/s LVOT Vmean:        41.400 cm/s LVOT VTI:          0.128 m LVOT/AV VTI ratio: 0.75  AORTA Ao Root diam: 3.40 cm MITRAL VALVE                  TRICUSPID VALVE MV Area (PHT): 4.15 cm       TR Peak grad:   66.3 mmHg MV Area VTI:    0.89 cm       TR Vmax:        407.00 cm/s MV Peak grad:  16.8 mmHg MV Mean grad:  5.0 mmHg       SHUNTS MV Vmax:       2.05 m/s       Systemic VTI:  0.13 m MV Vmean:      102.0 cm/s     Systemic Diam: 2.10 cm MV Decel Time: 183 msec MR Peak grad:    80.3 mmHg MR Mean grad:    58.0 mmHg MR Vmax:         448.00 cm/s MR Vmean:        362.0 cm/s MR PISA:         4.02 cm MR PISA Eff ROA: 34 mm MR PISA Radius:  0.80 cm MV E velocity: 171.00 cm/s MV A velocity: 46.60 cm/s MV E/A ratio:  3.67 Cristal Deer End MD Electronically signed by Yvonne Kendall MD Signature Date/Time: 03/13/2023/5:38:15 PM    Final  PERIPHERAL VASCULAR CATHETERIZATION  Result Date: 03/13/2023 See surgical note for result.  DG Chest 2 View  Result Date: 03/13/2023 CLINICAL DATA:  Shortness of breath. EXAM: CHEST - 2 VIEW COMPARISON:  February 20, 2023 FINDINGS: Cardiac silhouette is mildly enlarged and unchanged in size. There is stable moderate severity prominence of the perihilar pulmonary vasculature. Mild, stable atelectasis is seen within the left lung base. No pleural effusion or pneumothorax is identified. The visualized skeletal structures are unremarkable. IMPRESSION: 1. Stable mild cardiomegaly and moderate severity pulmonary vascular congestion. 2. Mild left basilar atelectasis. Electronically Signed   By: Aram Candela M.D.   On: 03/13/2023 00:23     Medications:     amLODipine  10 mg Oral Daily   aspirin EC  81 mg Oral Daily   atorvastatin  40 mg Oral q morning   Chlorhexidine Gluconate Cloth  6 each Topical Q0600   furosemide  40 mg Intravenous Q12H   heparin  5,000 Units Subcutaneous Q8H   insulin aspart  0-9 Units Subcutaneous Q4H   metoprolol tartrate  25 mg Oral BID   acetaminophen **OR** acetaminophen, hydrALAZINE, lidocaine (PF), ondansetron **OR** ondansetron (ZOFRAN) IV, mouth rinse  Assessment/ Plan:  Mr. MERRELL VITTORI is a 67 y.o.  male with past medical conditions including diabetes,  hyperlipidemia, hypertension, congestive heart failure, and chronic kidney disease stage IV.  He presents to the emergency department due to abnormal labs and has been admitted for Acute renal failure (HCC) [N17.9] ESRD (end stage renal disease) (HCC) [N18.6] ESRD needing dialysis (HCC) [N18.6, Z99.2]   End-stage renal disease requiring hemodialysis with Volume overload.    Long-term plan includes PD catheter placement with transition to peritoneal dialysis.  Renal navigator informed of patient and will begin seeking outpatient clinic.  Appreciate vascular surgery assistance for placing temporary dialysis catheter.  He has tolerated his first dialysis treatment well today (03/14/2023).  Plan would be to dialyze tomorrow and place tunneled dialysis catheter prior to discharge.  2. Anemia of chronic kidney disease Lab Results  Component Value Date   HGB 9.4 (L) 03/14/2023    Hemoglobin within desired range.  May consider low-dose EPO later this week.  3. Secondary Hyperparathyroidism: with outpatient labs: PTH 1123, phosphorus 6.8, calcium 9.2 on 04/17/22.   Lab Results  Component Value Date   CALCIUM 8.0 (L) 03/14/2023   PHOS 7.3 (H) 03/14/2023    Will start oral calcitriol and phosphorus binders.  4.  Hypertension with chronic kidney disease.  Outpatient regimen includes amlodipine, hydralazine, metoprolol, and torsemide.  Receiving these medications except torsemide.  Currently receiving IV furosemide 40 mg twice daily.  -May change to oral diuretic now since he has started hemodialysis.  5. Diabetes mellitus type II with chronic kidney disease/renal manifestations: noninsulin dependent. Home regimen includes [place]. Most recent hemoglobin A1c is 8.5 on 03/13/2023.  -Agree with discontinuing Marcelline Deist and Finerenone as now he is on dialysis. -Blood sugars currently managed with sliding scale insulin.    LOS: 2 Maurene Hollin 10/16/20241:03 PM

## 2023-03-14 NOTE — Progress Notes (Signed)
  Received patient in bed to unit.   Informed consent signed and in chart.    TX duration 2hr     Transported back to floor  Hand-off given to patient's nurse. No c/o and no distress noted    Access used: R Femoral catheter Access issues: none   Total UF removed: 0L Medication(s) given: none Post HD VS: 153/86 Post HD weight: 87.7kg     Lynann Beaver  [facility]

## 2023-03-14 NOTE — Plan of Care (Signed)

## 2023-03-14 NOTE — Progress Notes (Signed)
Progress Note   Patient: Victor Hernandez ZOX:[id number] DOB: 1956/02/22 DOA: 03/12/2023     2 DOS: the patient was seen and examined on 03/14/2023   Brief hospital course:  [name] is a 67 y.o. male with medical history significant for hypertension, insulin-dependent type 2 diabetes mellitus, HFpEF, severe mitral regurg on echo 2022, CKD stage IV-V.   Presented to ED from PCP for abnormal blood work, specifically worsening renal function.  Patient had been reporting increasing lower extremity edema dyspnea on exertion and orthopnea.    ED Course: BP 194/119 on arrival with pulse in the 90s and low 100s and tachypneic at times to 33 with O2 sat intermittently dipping to as low as 86% on room air. Labs: BNP 2518,WBC, hgb 9.5 which appears to be his baseline CMP: Cr 7.28 with normal bicarb of 23 and potassium 4.1, GFR 8 EKG not done Chest x-ray: pulmonary vascular congestion Patient was treated with IV labetalol oral hydralazine and metoprolol and also given a dose of IV Lasix.    Nephrology was consulted    Patient was admitted to medicine service for further workup and management of hypertensive emergency with pulmonary edema and acute renal failure.   Further hospital course and management as outlined in detail below.   10/15 -- R femoral temp dialysis line placed by Vascular surgery 10/16 -- pt tolerated initial dialysis session this AM     Assessment and Plan:  Hypertensive emergency- 190 systolics on admission - continue home amlodipine and hydralazine.  - titrate regimen for adequate BP control - monitor for fluctuation with initiation of HD   Acute respiratory failure with hypoxia, POA- resolved. secondary to vascular congestion  / fluid overload (multifactorial with severe range HTN and worsening renal function).  No oxygen use at baseline. originally on 2L O2, now on room air.  Denies respiratory distress, SOB with ambulating to the bathroom - continue IV lasix.  Endorses good UOP - echo on 10/15 showed EF 45-50%, grade III diastolic dysfunction, mod-severe MR, mild-mod TR, small pericardial effusion - volume management now via hemodialysis   ESRD needing dialysis (HCC)- Cr greatly worsened and having hypervolemia with end-organ failure. Endorses 3 voids today which was not recorded. Endorses improvement in his LE edema since admission - continue lasix Nephrology consulted and recommends initiation of HD.  Vascular consulted- R femoralTDC placed 10/15 Dialysis initial session today 10/16 - strict I/O - BMP am    Acute on chronic diastolic CHF  (HCC)- echo read pending - continue home metoprolol   Diabetes mellitus, type II (HCC) Sliding scale insulin coverage   Anemia of chronic kidney failure, stage 5 (HCC) Appears at baseline - CBC am        Subjective: Pt seen after dialysis today, s/o at bedside.  He denies any complaints and reports he tolerated dialysis without issues.  Reports some soreness at R groin catheter site.  Physical Exam: Vitals:   03/14/23 0930 03/14/23 1000 03/14/23 1028 03/14/23 1113  BP: 137/76 135/78 (!) 153/86 (!) 152/90  Pulse: 62 (!) 48 89 89  Resp: (!) 27 19 (!) 25 20  Temp:   (!) 97.5 F (36.4 C) 97.6 F (36.4 C)  TempSrc:   Oral Oral  SpO2: 100%  100% 100%  Weight:      Height:       General exam: awake, alert, no acute distress HEENT: moist mucus membranes, hearing grossly normal  Respiratory system: CTAB, no wheezes, rales or rhonchi, normal respiratory  effort. Cardiovascular system: normal S1/S2, RRR, no pedal edema.   Gastrointestinal system: soft, NT, ND, no HSM felt, +bowel sounds. Central nervous system: A&O x 3. no gross focal neurologic deficits, normal speech Extremities: R fem TDC present,  moves all equally Skin: dry, intact, normal temperature Psychiatry: normal mood, congruent affect, judgement and insight appear normal   Data Reviewed:  Notable labs ---  glucose 113, BUN 98, Cr  7.53, Ca 8.0, phos 7.3, albumin 2.5, Hbg 9.4  Family Communication: at bedside on rounds, updated  Disposition: Status is: Inpatient Remains inpatient appropriate because: initiated on hemodialysis, will remain admitted pending outpatient dialysis chair time.   Planned Discharge Destination: Home    Time spent: 45 minutes  Author: Pennie Banter, DO 03/14/2023 3:36 PM  For on call review [email or web address].

## 2023-03-14 NOTE — Plan of Care (Addendum)
Nutrition Education Note  67 y/o male with h/o CHF, DM, HTN, HLD and CKD IV who is admitted with ARF and progression to ESRD s/p new HD.   RD consulted for Renal Education. Provided "Low sodium Nutrition Therapy" handout from the Academy of [employer] and Dietetics. Reviewed food groups and provided written recommended serving sizes specifically determined for patient's current nutritional status.   Explained why diet restrictions are needed and provided lists of foods to limit/avoid that are high potassium, sodium, and phosphorus. Provided specific recommendations on safer alternatives of these foods. Strongly encouraged compliance of this diet.   Discussed importance of protein intake at each meal and snack. Provided examples of how to maximize protein intake throughout the day. Discussed the possible need for fluid restriction with dialysis, importance of minimizing weight gain between HD treatments, and renal-friendly beverage options.  Encouraged pt to take a daily renal multivitamin   Encouraged pt to discuss specific diet questions/concerns with RD at HD outpatient facility. Teach back method used.  Expect good compliance.  Body mass index is 26.98 kg/m. Pt meets criteria for overweight based on current BMI.  Nutrition Focused Physical Exam:  Flowsheet Row Most Recent Value  Orbital Region No depletion  Upper Arm Region No depletion  Thoracic and Lumbar Region No depletion  Buccal Region No depletion  Temple Region No depletion  Clavicle Bone Region No depletion  Clavicle and Acromion Bone Region No depletion  Scapular Bone Region No depletion  Dorsal Hand No depletion  Patellar Region No depletion  Anterior Thigh Region No depletion  Posterior Calf Region No depletion  Edema (RD Assessment) Mild  Hair Reviewed  Eyes Reviewed  Mouth Reviewed  Skin Reviewed  Nails Reviewed   Current diet order is HH/CHO modified, patient is consuming approximately 100% of meals at this  time. Labs and medications reviewed.   RD will add Nepro Shake po TID, each supplement provides 425 kcal and 19 grams protein RD will rena-vit po daily   No further nutrition interventions warranted at this time. RD contact information provided. If additional nutrition issues arise, please re-consult RD.  Betsey Holiday MS, RD, LDN Please refer to [facility] for RD and/or RD on-call/weekend/after hours pager

## 2023-03-15 ENCOUNTER — Inpatient Hospital Stay: Payer: Medicare HMO

## 2023-03-15 ENCOUNTER — Other Ambulatory Visit: Payer: Self-pay

## 2023-03-15 DIAGNOSIS — I5033 Acute on chronic diastolic (congestive) heart failure: Secondary | ICD-10-CM | POA: Diagnosis not present

## 2023-03-15 LAB — RENAL FUNCTION PANEL
Albumin: 2.4 g/dL — ABNORMAL LOW (ref 3.5–5.0)
Anion gap: 10 (ref 5–15)
BUN: 81 mg/dL — ABNORMAL HIGH (ref 8–23)
CO2: 25 mmol/L (ref 22–32)
Calcium: 8 mg/dL — ABNORMAL LOW (ref 8.9–10.3)
Chloride: 109 mmol/L (ref 98–111)
Creatinine, Ser: 6.28 mg/dL — ABNORMAL HIGH (ref 0.61–1.24)
GFR, Estimated: 9 mL/min — ABNORMAL LOW (ref >60)
Glucose, Bld: 135 mg/dL — ABNORMAL HIGH (ref 70–99)
Phosphorus: 6.8 mg/dL — ABNORMAL HIGH (ref 2.5–4.6)
Potassium: 4.5 mmol/L (ref 3.5–5.1)
Sodium: 144 mmol/L (ref 135–145)

## 2023-03-15 LAB — GLUCOSE, CAPILLARY
Glucose-Capillary: 117 mg/dL — ABNORMAL HIGH (ref 70–99)
Glucose-Capillary: 121 mg/dL — ABNORMAL HIGH (ref 70–99)
Glucose-Capillary: 140 mg/dL — ABNORMAL HIGH (ref 70–99)
Glucose-Capillary: 179 mg/dL — ABNORMAL HIGH (ref 70–99)
Glucose-Capillary: 195 mg/dL — ABNORMAL HIGH (ref 70–99)
Glucose-Capillary: 201 mg/dL — ABNORMAL HIGH (ref 70–99)

## 2023-03-15 LAB — CBC
HCT: 29.4 % — ABNORMAL LOW (ref 39.0–52.0)
Hemoglobin: 8.8 g/dL — ABNORMAL LOW (ref 13.0–17.0)
MCH: 26.4 pg (ref 26.0–34.0)
MCHC: 29.9 g/dL — ABNORMAL LOW (ref 30.0–36.0)
MCV: 88.3 fL (ref 80.0–100.0)
Platelets: 251 10*3/uL (ref 150–400)
RBC: 3.33 MIL/uL — ABNORMAL LOW (ref 4.22–5.81)
RDW: 17.3 % — ABNORMAL HIGH (ref 11.5–15.5)
WBC: 7.3 10*3/uL (ref 4.0–10.5)
nRBC: 0 % (ref 0.0–0.2)

## 2023-03-15 LAB — HEPATITIS B SURFACE ANTIBODY, QUANTITATIVE: Hep B S AB Quant (Post): <3.5 m[IU]/mL — ABNORMAL LOW

## 2023-03-15 MED ORDER — HEPARIN SODIUM (PORCINE) 1000 UNIT/ML IJ SOLN
INTRAMUSCULAR | Status: AC
  Filled 2023-03-15: qty 10

## 2023-03-15 MED ORDER — HYDRALAZINE HCL 20 MG/ML IJ SOLN: 5.0000 mg | INTRAMUSCULAR | Status: DC | PRN

## 2023-03-15 NOTE — Discharge Planning (Signed)
Following for OPHD placement. Attempted to meet with patient today to discuss HD clinic choices and provide education. Patient appeared to be confused, stating "what kind of games are we playing?" "I don't know what is going on in here but you all are good," Attempted calling wife at bedside as patient was asking where she was, no answer and mailbox was full. Patient stated that he needed to use the restroom, informed patient that this need would be passed to the nursing staff. Informed 2C Diplomatic Services operational officer of patient's need. Will attempt meeting with patient again tomorrow.  Dimas Chyle Dialysis Coordinator II  [facility] Cell: [phone] eFax: [phone] [email or web address]

## 2023-03-15 NOTE — TOC CM/SW Note (Signed)
Transition of Care [facility]) - Inpatient Brief Assessment   Patient Details  Name: Victor Hernandez MRN: [id number] Date of Birth: 1955/08/13  Transition of Care [facility]) CM/SW Contact:    Margarito Liner, LCSW Phone Number: 03/15/2023, 1:09 PM   Clinical Narrative: TOC acknowledges heart failure consult. Patient has been AOx4 but according to two staff members, he is confused today. CSW will assess patient once mental status improved.  Transition of Care Asessment: Insurance and Status: Insurance coverage has been reviewed Patient has primary care physician: Yes Home environment has been reviewed: Single family home Prior level of function:: Not documented Prior/Current Home Services: No current home services Social Determinants of Health Reivew: SDOH reviewed no interventions necessary Readmission risk has been reviewed: Yes Transition of care needs: no transition of care needs at this time

## 2023-03-15 NOTE — Progress Notes (Signed)
[facility]  ROUNDING NOTE   Subjective:   Victor Hernandez  is a 67 year old male with past medical conditions including diabetes, hyperlipidemia, hypertension, congestive heart failure, and chronic kidney disease stage IV.  He presents to the emergency department due to abnormal labs and has been admitted for Acute renal failure (HCC) [N17.9] ESRD (end stage renal disease) (HCC) [N18.6] ESRD needing dialysis (HCC) [N18.6, Z99.2]  Patient is known to our practice from a previous admission.  Dialysis was discussed at that time, GFR was 9, but patient stated he would like to think about it.  Patient was seen in office postdischarge but failed to follow-up after that time.  Patient does admit to not properly taking care of himself since then.  Does report uremic symptoms of loss of appetite, food distaste, fatigue, and drowsiness.  Generalized edema noted.  Patient remains on room air.  Labs on ED arrival significant for BUN 98, creatinine 7.27 with GFR 8, albumin 2.7, BNP greater than 2500, hemoglobin 9.5.  Respiratory panel negative for COVID-19.  Chest x-ray shows a mild cardiomegaly with moderate severity pulmonary vascular congestion and mild left basilar atelectasis.  Patient had his first dialysis treatment on Wednesday and second dialysis treatment on Thursday.  Hemodynamically he has tolerated well.  However, there are reports of confusion off and on. BUN decreased from 98 today.  Doubt dialysis disequilibrium. Patient may have underlying dementia versus hospital delirium.   Objective:  Vital signs in last 24 hours:  Temp:  [97.6 F (36.4 C)-98.9 F (37.2 C)] 97.6 F (36.4 C) (10/17 1052) Pulse Rate:  [58-92] 91 (10/17 1052) Resp:  [16-23] 16 (10/17 1052) BP: (110-168)/(78-104) 149/100 (10/17 1651) SpO2:  [92 %-100 %] 100 % (10/17 1052) Weight:  [86.3 kg-87.7 kg] 86.3 kg (10/17 1025)  Weight change: -1.795 kg Filed Weights   03/14/23 0808 03/15/23 0738 03/15/23 1025   Weight: 87.7 kg 87.7 kg 86.3 kg    Intake/Output: I/O last 3 completed shifts: In: 120 [P.O.:120] Out: 0    Intake/Output this shift:  Total I/O In: 240 [P.O.:240] Out: 2000 [Other:2000]  Physical Exam: General: NAD  Head: Normocephalic, atraumatic. Moist oral mucosal membranes  Eyes: Anicteric  Lungs:  Clear to auscultation, normal effort  Heart: Regular rate and rhythm  Abdomen:  Soft, nontender,   Extremities: + peripheral edema.  Neurologic: Alert and oriented, moving all four extremities  Skin: No lesions  Access: Right femoral temporary catheter    Basic Metabolic Panel: Recent Labs  Lab 03/12/23 1916 03/12/23 2151 03/13/23 0500 03/14/23 0531 03/15/23 0526  NA 143  --  143 145 144  K 4.1  --  3.9 4.3 4.5  CL 107  --  108 109 109  CO2 23  --  23 24 25   GLUCOSE 166*  --  137* 113* 135*  BUN 98*  --  97* 98* 81*  CREATININE 7.27*  --  7.15* 7.53* 6.28*  CALCIUM 8.4*  --  8.5* 8.0* 8.0*  MG  --  2.2  --   --   --   PHOS  --   --   --  7.3* 6.8*    Liver Function Tests: Recent Labs  Lab 03/12/23 1916 03/14/23 0531 03/15/23 0526  AST 17  --   --   ALT 23  --   --   ALKPHOS 117  --   --   BILITOT 0.8  --   --   PROT 6.0*  --   --  ALBUMIN 2.7* 2.5* 2.4*   No results for input(s): "LIPASE", "AMYLASE" in the last 168 hours. No results for input(s): "AMMONIA" in the last 168 hours.  CBC: Recent Labs  Lab 03/12/23 1916 03/14/23 0531 03/15/23 0805  WBC 7.1 6.4 7.3  NEUTROABS 4.9  --   --   HGB 9.5* 9.4* 8.8*  HCT 32.4* 31.6* 29.4*  MCV 88.8 89.3 88.3  PLT 271 268 251    Cardiac Enzymes: No results for input(s): "CKTOTAL", "CKMB", "CKMBINDEX", "TROPONINI" in the last 168 hours.  BNP: Invalid input(s): "POCBNP"  CBG: Recent Labs  Lab 03/14/23 2335 03/15/23 0456 03/15/23 1036 03/15/23 1124 03/15/23 1640  GLUCAP 195* 117* 140* 121* 195*    Microbiology: Results for orders placed or performed during the hospital encounter of  03/12/23  SARS Coronavirus 2 by RT PCR (hospital order, performed in [facility] hospital lab) *cepheid single result test* Anterior Nasal Swab     Status: None   Collection Time: 03/12/23  9:52 PM   Specimen: Anterior Nasal Swab  Result Value Ref Range Status   SARS Coronavirus 2 by RT PCR NEGATIVE NEGATIVE Final    Comment: (NOTE) SARS-CoV-2 target nucleic acids are NOT DETECTED.  The SARS-CoV-2 RNA is generally detectable in upper and lower respiratory specimens during the acute phase of infection. The lowest concentration of SARS-CoV-2 viral copies this assay can detect is 250 copies / mL. A negative result does not preclude SARS-CoV-2 infection and should not be used as the sole basis for treatment or other patient management decisions.  A negative result may occur with improper specimen collection / handling, submission of specimen other than nasopharyngeal swab, presence of viral mutation(s) within the areas targeted by this assay, and inadequate number of viral copies (<250 copies / mL). A negative result must be combined with clinical observations, patient history, and epidemiological information.  Fact Sheet for Patients:   RoadLapTop.co.za  Fact Sheet for Healthcare Providers: http://kim-miller.com/  This test is not yet approved or  cleared by the [place] FDA and has been authorized for detection and/or diagnosis of SARS-CoV-2 by FDA under an Emergency Use Authorization (EUA).  This EUA will remain in effect (meaning this test can be used) for the duration of the COVID-19 declaration under Section 564(b)(1) of the Act, 21 U.S.C. section 360bbb-3(b)(1), unless the authorization is terminated or revoked sooner.  Performed at [facility], [address] [place], [place] [address]     Coagulation Studies: No results for input(s): "LABPROT", "INR" in the last 72 hours.  Urinalysis: No results for  input(s): "COLORURINE", "LABSPEC", "PHURINE", "GLUCOSEU", "HGBUR", "BILIRUBINUR", "KETONESUR", "PROTEINUR", "UROBILINOGEN", "NITRITE", "LEUKOCYTESUR" in the last 72 hours.  Invalid input(s): "APPERANCEUR"    Imaging: No results found.   Medications:     amLODipine  10 mg Oral Daily   aspirin EC  81 mg Oral Daily   atorvastatin  40 mg Oral q morning   calcitRIOL  0.25 mcg Oral Daily   calcium acetate  1,334 mg Oral TID WC   Chlorhexidine Gluconate Cloth  6 each Topical Q0600   feeding supplement (NEPRO CARB STEADY)  237 mL Oral TID BM   heparin  5,000 Units Subcutaneous Q8H   insulin aspart  0-9 Units Subcutaneous Q4H   metoprolol tartrate  25 mg Oral BID   multivitamin  1 tablet Oral QHS   torsemide  100 mg Oral Daily   acetaminophen **OR** acetaminophen, hydrALAZINE, lidocaine (PF), ondansetron **OR** ondansetron (ZOFRAN) IV, mouth rinse  Assessment/ Plan:  Mr. JEOFFREY ELEAZER is a 67 y.o.  male with past medical conditions including diabetes, hyperlipidemia, hypertension, congestive heart failure, and chronic kidney disease stage IV.  He presents to the emergency department due to abnormal labs and has been admitted for Acute renal failure (HCC) [N17.9] ESRD (end stage renal disease) (HCC) [N18.6] ESRD needing dialysis (HCC) [N18.6, Z99.2]   End-stage renal disease requiring hemodialysis with Volume overload.    Long-term plan includes PD catheter placement with transition to peritoneal dialysis.  Renal navigator informed of patient and will begin seeking outpatient clinic.  Appreciate vascular surgery assistance for placing temporary dialysis catheter.  He has tolerated his first dialysis treatment well (03/14/2023).  Plan for tunneled dialysis catheter to be placed on Friday .  Next dialysis potentially on Saturday.  Temporary femoral line can be removed after tunneled dialysis catheter is placed.  2000 cc removed with hemodialysis today.  2. Anemia of chronic kidney disease Lab  Results  Component Value Date   HGB 8.8 (L) 03/15/2023    Hemoglobin within desired range.  May consider low-dose EPO later this week.  3. Secondary Hyperparathyroidism: with outpatient labs: PTH 1123, phosphorus 6.8, calcium 9.2 on 04/17/22.   Lab Results  Component Value Date   CALCIUM 8.0 (L) 03/15/2023   PHOS 6.8 (H) 03/15/2023    Continue oral calcitriol and phosphorus binders.  4.  Hypertension with chronic kidney disease.  Outpatient regimen includes amlodipine, hydralazine, metoprolol, and torsemide.    5. Diabetes mellitus type II with chronic kidney disease/renal manifestations: noninsulin dependent. Home regimen includes [place]. Most recent hemoglobin A1c is 8.5 on 03/13/2023.  -Agree with discontinuing Marcelline Deist and Finerenone as now he is on dialysis. -Blood sugars currently managed with sliding scale insulin.    LOS: 3 Latavia Goga 10/17/20245:04 PM

## 2023-03-15 NOTE — Progress Notes (Signed)
Progress Note   Patient: Victor Hernandez NWG:[id number] DOB: 01/24/1956 DOA: 03/12/2023     3 DOS: the patient was seen and examined on 03/15/2023   Brief hospital course:  CYDNEY IMGRUND is a 67 y.o. male with medical history significant for hypertension, insulin-dependent type 2 diabetes mellitus, HFpEF, severe mitral regurg on echo 2022, CKD stage IV-V.   Presented to ED from PCP for abnormal blood work, specifically worsening renal function.  Patient had been reporting increasing lower extremity edema dyspnea on exertion and orthopnea.    ED Course: BP 194/119 on arrival with pulse in the 90s and low 100s and tachypneic at times to 33 with O2 sat intermittently dipping to as low as 86% on room air. Labs: BNP 2518,WBC, hgb 9.5 which appears to be his baseline CMP: Cr 7.28 with normal bicarb of 23 and potassium 4.1, GFR 8 EKG not done Chest x-ray: pulmonary vascular congestion Patient was treated with IV labetalol oral hydralazine and metoprolol and also given a dose of IV Lasix.    Nephrology was consulted    Patient was admitted to medicine service for further workup and management of hypertensive emergency with pulmonary edema and acute renal failure.   Further hospital course and management as outlined in detail below.   10/15 -- R femoral temp dialysis line placed by Vascular surgery 10/16 -- pt tolerated initial dialysis session this AM     Assessment and Plan:  Hypertensive emergency- 190 systolics on admission - continue home amlodipine and hydralazine.  - titrate regimen for adequate BP control - monitor for fluctuation with initiation of HD   Acute respiratory failure with hypoxia, POA- resolved. secondary to vascular congestion  / fluid overload (multifactorial with severe range HTN and worsening renal function).  No oxygen use at baseline. originally on 2L O2, now on room air.  Denies respiratory distress, SOB with ambulating to the bathroom - continue IV lasix.  Endorses good UOP - echo on 10/15 showed EF 45-50%, grade III diastolic dysfunction, mod-severe MR, mild-mod TR, small pericardial effusion - volume management now via hemodialysis   ESRD needing dialysis (HCC)- Cr greatly worsened and having hypervolemia with end-organ failure. Endorses 3 voids today which was not recorded. Endorses improvement in his LE edema since admission - continue lasix Nephrology consulted and recommends initiation of HD.  Vascular consulted- R femoralTDC placed 10/15 Dialysis initial session today 10/16 - strict I/O - BMP am    Acute on chronic diastolic CHF  (HCC)- echo read pending - continue home metoprolol   Diabetes mellitus, type II (HCC) Sliding scale insulin coverage   Anemia of chronic kidney failure, stage 5 (HCC) Appears at baseline - CBC am        Subjective: Pt seen in dialysis resting.  RN reported pt refused his medications this AM.  Pt reportedly seems more confused today.  He is minimally interactive for me, denies complaints.   Physical Exam: Vitals:   03/15/23 1025 03/15/23 1052 03/15/23 1651 03/15/23 1900  BP: (!) 141/90 (!) 156/92 (!) 149/100 131/89  Pulse: 84 91  80  Resp: 20 16  18   Temp: 98.1 F (36.7 C) 97.6 F (36.4 C)  97.6 F (36.4 C)  TempSrc: Axillary Oral  Oral  SpO2: 100% 100%  100%  Weight: 86.3 kg     Height:       General exam: resting in dialysis, no acute distress HEENT: moist mucus membranes, hearing grossly normal  Respiratory system: om room air, normal  respiratory effort. Cardiovascular system: RRR, trace pedal edema.   Gastrointestinal system: soft, NT, ND Central nervous system: no gross focal neurologic deficits, limited exam Extremities: R fem TDC present accessed for dialysis,  moves all equally Psychiatry: normal mood, flat affect, exam limited   Data Reviewed:  Notable labs ---  glucose 135, BUN 81, Cr 6.28, Ca 8.0, phos 6.8, albumin 2.4, Hbg 8.8  Family Communication: at bedside on  rounds 10/16, updated  Disposition: Status is: Inpatient Remains inpatient appropriate because: initiated on hemodialysis, will remain admitted pending outpatient dialysis chair time.   Planned Discharge Destination: Home    Time spent: 35 minutes  Author: Pennie Banter, DO 03/15/2023 7:25 PM  For on call review [email or web address].

## 2023-03-15 NOTE — H&P (View-Only) (Signed)
  Progress Note    03/15/2023 12:52 PM 2 Days Post-Op  Subjective:   Victor Hernandez  is a 67 y.o. male with medical history significant for hypertension, insulin-dependent type 2 diabetes mellitus, HFpEF, severe mitral regurg on echo 2022, CKD stage IV-V, sent in by PCP for abnormal blood work, specifically worsening renal function.   Patient is now POD #1 from temporary dialysis access for hemodialysis. No complications to note. Vitals all remain stable. Vascular Surgery was asked to place a Perma Catheter for long term outpatient hemodialysis use.    Vitals:   03/15/23 1025 03/15/23 1052  BP: (!) 141/90 (!) 156/92  Pulse: 84 91  Resp: 20 16  Temp: 98.1 F (36.7 C) 97.6 F (36.4 C)  SpO2: 100% 100%   Physical Exam: Cardiac:  RRR, normal S1 and S2.  No murmurs appreciated. Lungs: Clear on auscultation throughout, nonlabored without rales rhonchi or wheezing. Incisions: Right groin temporary dialysis catheter insertion.  Without complications. Extremities: All extremities with good ROM, strength and reflexes.  All pulses palpable. Abdomen: Positive bowel sounds, soft, nontender and distended. Neurologic: Alert and oriented x 3, answers all questions and follows commands appropriately.  CBC    Component Value Date/Time   WBC 7.3 03/15/2023 0805   RBC 3.33 (L) 03/15/2023 0805   HGB 8.8 (L) 03/15/2023 0805   HCT 29.4 (L) 03/15/2023 0805   PLT 251 03/15/2023 0805   MCV 88.3 03/15/2023 0805   MCH 26.4 03/15/2023 0805   MCHC 29.9 (L) 03/15/2023 0805   RDW 17.3 (H) 03/15/2023 0805   LYMPHSABS 1.4 03/12/2023 1916   MONOABS 0.7 03/12/2023 1916   EOSABS 0.2 03/12/2023 1916   BASOSABS 0.0 03/12/2023 1916    BMET    Component Value Date/Time   NA 144 03/15/2023 0526   K 4.5 03/15/2023 0526   CL 109 03/15/2023 0526   CO2 25 03/15/2023 0526   GLUCOSE 135 (H) 03/15/2023 0526   BUN 81 (H) 03/15/2023 0526   CREATININE 6.28 (H) 03/15/2023 0526   CALCIUM 8.0 (L) 03/15/2023 0526    GFRNONAA 9 (L) 03/15/2023 0526    INR No results found for: "INR"   Intake/Output Summary (Last 24 hours) at 03/15/2023 1252 Last data filed at 03/15/2023 1025 Gross per 24 hour  Intake 120 ml  Output 2000 ml  Net -1880 ml     Assessment/Plan:  67 y.o. male is s/p  temporary dialysis access for hemodialysis.  2 Days Post-Op   PLAN: Vascular surgery plans on taking the patient to the vascular lab tomorrow for permacatheter dialysis access placement for long-term hemodialysis.  I discussed in detail with the patient and his wife the procedure, benefits, risks, and complications.  Both verbalized her understanding and wished to proceed.  I answered all her questions today.  Patient will be made n.p.o. after midnight for procedure tomorrow on 03/16/2023.  DVT prophylaxis: Heparin with dialysis   Marcie Bal [facility] 03/15/2023 12:52 PM

## 2023-03-15 NOTE — Progress Notes (Signed)
  Progress Note    03/15/2023 12:52 PM 2 Days Post-Op  Subjective:   Victor Hernandez  is a 67 y.o. male with medical history significant for hypertension, insulin-dependent type 2 diabetes mellitus, HFpEF, severe mitral regurg on echo 2022, CKD stage IV-V, sent in by PCP for abnormal blood work, specifically worsening renal function.   Patient is now POD #1 from temporary dialysis access for hemodialysis. No complications to note. Vitals all remain stable. Vascular Surgery was asked to place a Perma Catheter for long term outpatient hemodialysis use.    Vitals:   03/15/23 1025 03/15/23 1052  BP: (!) 141/90 (!) 156/92  Pulse: 84 91  Resp: 20 16  Temp: 98.1 F (36.7 C) 97.6 F (36.4 C)  SpO2: 100% 100%   Physical Exam: Cardiac:  RRR, normal S1 and S2.  No murmurs appreciated. Lungs: Clear on auscultation throughout, nonlabored without rales rhonchi or wheezing. Incisions: Right groin temporary dialysis catheter insertion.  Without complications. Extremities: All extremities with good ROM, strength and reflexes.  All pulses palpable. Abdomen: Positive bowel sounds, soft, nontender and distended. Neurologic: Alert and oriented x 3, answers all questions and follows commands appropriately.  CBC    Component Value Date/Time   WBC 7.3 03/15/2023 0805   RBC 3.33 (L) 03/15/2023 0805   HGB 8.8 (L) 03/15/2023 0805   HCT 29.4 (L) 03/15/2023 0805   PLT 251 03/15/2023 0805   MCV 88.3 03/15/2023 0805   MCH 26.4 03/15/2023 0805   MCHC 29.9 (L) 03/15/2023 0805   RDW 17.3 (H) 03/15/2023 0805   LYMPHSABS 1.4 03/12/2023 1916   MONOABS 0.7 03/12/2023 1916   EOSABS 0.2 03/12/2023 1916   BASOSABS 0.0 03/12/2023 1916    BMET    Component Value Date/Time   NA 144 03/15/2023 0526   K 4.5 03/15/2023 0526   CL 109 03/15/2023 0526   CO2 25 03/15/2023 0526   GLUCOSE 135 (H) 03/15/2023 0526   BUN 81 (H) 03/15/2023 0526   CREATININE 6.28 (H) 03/15/2023 0526   CALCIUM 8.0 (L) 03/15/2023 0526    GFRNONAA 9 (L) 03/15/2023 0526    INR No results found for: "INR"   Intake/Output Summary (Last 24 hours) at 03/15/2023 1252 Last data filed at 03/15/2023 1025 Gross per 24 hour  Intake 120 ml  Output 2000 ml  Net -1880 ml     Assessment/Plan:  67 y.o. male is s/p  temporary dialysis access for hemodialysis.  2 Days Post-Op   PLAN: Vascular surgery plans on taking the patient to the vascular lab tomorrow for permacatheter dialysis access placement for long-term hemodialysis.  I discussed in detail with the patient and his wife the procedure, benefits, risks, and complications.  Both verbalized her understanding and wished to proceed.  I answered all her questions today.  Patient will be made n.p.o. after midnight for procedure tomorrow on 03/16/2023.  DVT prophylaxis: Heparin with dialysis   Marcie Bal [facility] 03/15/2023 12:52 PM

## 2023-03-15 NOTE — Progress Notes (Addendum)
Hemodialysis note  Received patient in bed to unit. Alert and oriented x 2.  Informed consent signed and in chart.  Treatment initiated: 1610 Treatment completed: 1025  Patient tolerated well. Transported back to room, not in acute distress. Patient appears to be sleepy but is easily arousable. Per patient, he didn't sleep well last night. Blood glucose: 140. Report given to patient's RN.   Access used: Right Femoral HD Catheter Access issues: none  Total UF removed: 2L (Goal was increased to 2L during Dr. Doristine Church rounds) Medication(s) given:  none  Post HD weight: 86.3 kg   Wolfgang Phoenix [facility]

## 2023-03-15 NOTE — Progress Notes (Addendum)
Patient refused all morning meds, patient educated on importance of meds, especially bp meds, md aware.

## 2023-03-15 NOTE — Care Management Important Message (Signed)
Important Message  Patient Details  Name: Victor Hernandez MRN: [id number] Date of Birth: Aug 11, 1955   Important Message Given:  Yes - Medicare IM     [name] 03/15/2023, 3:16 PM

## 2023-03-16 ENCOUNTER — Encounter
Admission: EM | Disposition: A | Discharge status: Home / Self Care | Payer: Self-pay | Source: Home / Self Care | Attending: Internal Medicine

## 2023-03-16 DIAGNOSIS — I5033 Acute on chronic diastolic (congestive) heart failure: Secondary | ICD-10-CM | POA: Diagnosis not present

## 2023-03-16 LAB — GLUCOSE, CAPILLARY
Glucose-Capillary: 120 mg/dL — ABNORMAL HIGH (ref 70–99)
Glucose-Capillary: 131 mg/dL — ABNORMAL HIGH (ref 70–99)
Glucose-Capillary: 155 mg/dL — ABNORMAL HIGH (ref 70–99)
Glucose-Capillary: 185 mg/dL — ABNORMAL HIGH (ref 70–99)
Glucose-Capillary: 224 mg/dL — ABNORMAL HIGH (ref 70–99)
Glucose-Capillary: 251 mg/dL — ABNORMAL HIGH (ref 70–99)

## 2023-03-16 LAB — RENAL FUNCTION PANEL
Albumin: 2.5 g/dL — ABNORMAL LOW (ref 3.5–5.0)
Anion gap: 9 (ref 5–15)
BUN: 75 mg/dL — ABNORMAL HIGH (ref 8–23)
CO2: 26 mmol/L (ref 22–32)
Calcium: 8.4 mg/dL — ABNORMAL LOW (ref 8.9–10.3)
Chloride: 106 mmol/L (ref 98–111)
Creatinine, Ser: 5.72 mg/dL — ABNORMAL HIGH (ref 0.61–1.24)
GFR, Estimated: 10 mL/min — ABNORMAL LOW (ref >60)
Glucose, Bld: 201 mg/dL — ABNORMAL HIGH (ref 70–99)
Phosphorus: 5.3 mg/dL — ABNORMAL HIGH (ref 2.5–4.6)
Potassium: 4.3 mmol/L (ref 3.5–5.1)
Sodium: 141 mmol/L (ref 135–145)

## 2023-03-16 LAB — MAGNESIUM: Magnesium: 2 mg/dL (ref 1.7–2.4)

## 2023-03-16 SURGERY — DIALYSIS/PERMA CATHETER INSERTION
Anesthesia: Moderate Sedation

## 2023-03-16 MED ORDER — MIDAZOLAM HCL 2 MG/ML PO SYRP: 8.0000 mg | ORAL_SOLUTION | Freq: Once | ORAL | Status: DC | PRN

## 2023-03-16 MED ORDER — METHYLPREDNISOLONE SODIUM SUCC 125 MG IJ SOLR: 125.0000 mg | Freq: Once | INTRAMUSCULAR | Status: DC | PRN

## 2023-03-16 MED ORDER — SODIUM CHLORIDE 0.9 % IV SOLN: INTRAVENOUS | Status: DC

## 2023-03-16 MED ORDER — FAMOTIDINE 20 MG PO TABS: 40.0000 mg | ORAL_TABLET | Freq: Once | ORAL | Status: DC | PRN

## 2023-03-16 MED ORDER — HYDROMORPHONE HCL 1 MG/ML IJ SOLN: 1.0000 mg | Freq: Once | INTRAMUSCULAR | Status: DC | PRN

## 2023-03-16 MED ORDER — CEFAZOLIN SODIUM-DEXTROSE 2-4 GM/100ML-% IV SOLN: 2.0000 g | INTRAVENOUS | Status: DC

## 2023-03-16 MED ORDER — DIPHENHYDRAMINE HCL 50 MG/ML IJ SOLN: 50.0000 mg | Freq: Once | INTRAMUSCULAR | Status: DC | PRN

## 2023-03-16 MED ORDER — CEFAZOLIN SODIUM-DEXTROSE 2-4 GM/100ML-% IV SOLN
INTRAVENOUS | Status: AC
  Filled 2023-03-16: qty 100

## 2023-03-16 NOTE — Progress Notes (Signed)
Patient had another run of V Tach, 12 beats, he says he didn't feel anything, he's sitting up on the edge of the bed. MD aware   Kem Boroughs

## 2023-03-16 NOTE — Progress Notes (Signed)
[facility]  ROUNDING NOTE   Subjective:   Victor Hernandez  is a 67 year old male with past medical conditions including diabetes, hyperlipidemia, hypertension, congestive heart failure, and chronic kidney disease stage IV.  He presents to the emergency department due to abnormal labs and has been admitted for Acute renal failure (HCC) [N17.9] ESRD (end stage renal disease) (HCC) [N18.6] ESRD needing dialysis (HCC) [N18.6, Z99.2]  Patient is known to our practice from a previous admission.   Patient seen sitting up in bed Alert and oriented Wife at bedside NPO for permcath placement  Requesting discharge information for outpatient dialysis clinic.    Objective:  Vital signs in last 24 hours:  Temp:  [97.6 F (36.4 C)-98.1 F (36.7 C)] 97.9 F (36.6 C) (10/18 1019) Pulse Rate:  [57-80] 57 (10/18 1019) Resp:  [16-24] 24 (10/18 1019) BP: (131-149)/(73-100) 145/83 (10/18 1019) SpO2:  [95 %-100 %] 95 % (10/18 1019) Weight:  [89 kg] 89 kg (10/18 1019)  Weight change: 0 kg Filed Weights   03/15/23 1025 03/16/23 0347 03/16/23 1019  Weight: 86.3 kg 89 kg 89 kg    Intake/Output: I/O last 3 completed shifts: In: 240 [P.O.:240] Out: 2000 [Other:2000]   Intake/Output this shift:  Total I/O In: -  Out: 175 [Urine:175]  Physical Exam: General: NAD  Head: Normocephalic, atraumatic. Moist oral mucosal membranes  Eyes: Anicteric  Lungs:  Clear to auscultation, normal effort  Heart: Regular rate and rhythm  Abdomen:  Soft, nontender,   Extremities: + peripheral edema.  Neurologic: Alert and oriented, moving all four extremities  Skin: No lesions  Access: Right femoral temporary catheter    Basic Metabolic Panel: Recent Labs  Lab 03/12/23 1916 03/12/23 2151 03/13/23 0500 03/14/23 0531 03/15/23 0526 03/16/23 0627  NA 143  --  143 145 144 141  K 4.1  --  3.9 4.3 4.5 4.3  CL 107  --  108 109 109 106  CO2 23  --  23 24 25 26   GLUCOSE 166*  --  137* 113*  135* 201*  BUN 98*  --  97* 98* 81* 75*  CREATININE 7.27*  --  7.15* 7.53* 6.28* 5.72*  CALCIUM 8.4*  --  8.5* 8.0* 8.0* 8.4*  MG  --  2.2  --   --   --  2.0  PHOS  --   --   --  7.3* 6.8* 5.3*    Liver Function Tests: Recent Labs  Lab 03/12/23 1916 03/14/23 0531 03/15/23 0526 03/16/23 0627  AST 17  --   --   --   ALT 23  --   --   --   ALKPHOS 117  --   --   --   BILITOT 0.8  --   --   --   PROT 6.0*  --   --   --   ALBUMIN 2.7* 2.5* 2.4* 2.5*   No results for input(s): "LIPASE", "AMYLASE" in the last 168 hours. No results for input(s): "AMMONIA" in the last 168 hours.  CBC: Recent Labs  Lab 03/12/23 1916 03/14/23 0531 03/15/23 0805  WBC 7.1 6.4 7.3  NEUTROABS 4.9  --   --   HGB 9.5* 9.4* 8.8*  HCT 32.4* 31.6* 29.4*  MCV 88.8 89.3 88.3  PLT 271 268 251    Cardiac Enzymes: No results for input(s): "CKTOTAL", "CKMB", "CKMBINDEX", "TROPONINI" in the last 168 hours.  BNP: Invalid input(s): "POCBNP"  CBG: Recent Labs  Lab 03/15/23 1914 03/15/23 2341  03/16/23 0339 03/16/23 0757 03/16/23 1018  GLUCAP 179* 201* 224* 155* 120*    Microbiology: Results for orders placed or performed during the hospital encounter of 03/12/23  SARS Coronavirus 2 by RT PCR (hospital order, performed in [facility] hospital lab) *cepheid single result test* Anterior Nasal Swab     Status: None   Collection Time: 03/12/23  9:52 PM   Specimen: Anterior Nasal Swab  Result Value Ref Range Status   SARS Coronavirus 2 by RT PCR NEGATIVE NEGATIVE Final    Comment: (NOTE) SARS-CoV-2 target nucleic acids are NOT DETECTED.  The SARS-CoV-2 RNA is generally detectable in upper and lower respiratory specimens during the acute phase of infection. The lowest concentration of SARS-CoV-2 viral copies this assay can detect is 250 copies / mL. A negative result does not preclude SARS-CoV-2 infection and should not be used as the sole basis for treatment or other patient management decisions.  A  negative result may occur with improper specimen collection / handling, submission of specimen other than nasopharyngeal swab, presence of viral mutation(s) within the areas targeted by this assay, and inadequate number of viral copies (<250 copies / mL). A negative result must be combined with clinical observations, patient history, and epidemiological information.  Fact Sheet for Patients:   RoadLapTop.co.za  Fact Sheet for Healthcare Providers: http://kim-miller.com/  This test is not yet approved or  cleared by the [place] FDA and has been authorized for detection and/or diagnosis of SARS-CoV-2 by FDA under an Emergency Use Authorization (EUA).  This EUA will remain in effect (meaning this test can be used) for the duration of the COVID-19 declaration under Section 564(b)(1) of the Act, 21 U.S.C. section 360bbb-3(b)(1), unless the authorization is terminated or revoked sooner.  Performed at [facility], [address] [place], [place] [address]     Coagulation Studies: No results for input(s): "LABPROT", "INR" in the last 72 hours.  Urinalysis: No results for input(s): "COLORURINE", "LABSPEC", "PHURINE", "GLUCOSEU", "HGBUR", "BILIRUBINUR", "KETONESUR", "PROTEINUR", "UROBILINOGEN", "NITRITE", "LEUKOCYTESUR" in the last 72 hours.  Invalid input(s): "APPERANCEUR"    Imaging: [place] UE VEIN MAPPING LEFT (PRE-OP AVF)  Result Date: 03/16/2023 CLINICAL DATA:  Preoperative vascular survey prior to arteriovenous fistula creation, end-stage renal disease EXAM: [place] EXTREM UP VEIN MAPPING COMPARISON:  None Available. FINDINGS: LEFT ARTERIES Wrist Radial Artery: Size 2.34mm Waveform Triphasic Wrist Ulnar Artery: Size 1.24mm Waveform Triphasic Prox. Forearm Radial Artery: Size 3.28mm Waveform Triphasic Upper Arm Brachial Artery: Size 5.64mm Waveform Triphasic LEFT VEINS Forearm Cephalic Vein: Prox 6mm Distal 2mm Depth 20mm Upper Arm  Cephalic Vein: Prox 4mm Distal 4mm Depth 5mm Upper Arm Basilic Vein: Prox 3mm Distal 3mm Depth 6mm Upper Arm Brachial Vein: Prox 10mm Distal 5mm Depth 5mm ADDITIONAL LEFT VEINS Axillary Vein:  19mm Subclavian Vein: Patient: Yes Respiratory Phasicity: Present Internal Jugular Vein: Patent: Yes    Respiratory Phasicity: Present Branches > 2 mm: At least 4 branches are present measuring 2 mm or larger. All 4 branches arise from the cephalic vein. IMPRESSION: 1. Relatively small radial artery measuring only 2 mm in diameter at the wrist. 2. Widely patent left veins without evidence of central venous obstruction. 3. At least 4 branches measuring larger than 2 mm in diameter arise from the cephalic vein. Electronically Signed   By: Malachy Moan M.D.   On: 03/16/2023 07:02     Medications:    sodium chloride      ceFAZolin (ANCEF) IV      [MAR Hold]  amLODipine  10 mg Oral Daily   [MAR Hold] aspirin EC  81 mg Oral Daily   [MAR Hold] atorvastatin  40 mg Oral q morning   [MAR Hold] calcitRIOL  0.25 mcg Oral Daily   [MAR Hold] calcium acetate  1,334 mg Oral TID WC   [MAR Hold] Chlorhexidine Gluconate Cloth  6 each Topical Q0600   [MAR Hold] feeding supplement (NEPRO CARB STEADY)  237 mL Oral TID BM   [MAR Hold] heparin  5,000 Units Subcutaneous Q8H   [MAR Hold] insulin aspart  0-9 Units Subcutaneous Q4H   [MAR Hold] metoprolol tartrate  25 mg Oral BID   [MAR Hold] multivitamin  1 tablet Oral QHS   [MAR Hold] torsemide  100 mg Oral Daily   [MAR Hold] acetaminophen **OR** [MAR Hold] acetaminophen, diphenhydrAMINE, famotidine, [MAR Hold] hydrALAZINE, HYDROmorphone (DILAUDID) injection, lidocaine (PF), methylPREDNISolone (SOLU-MEDROL) injection, midazolam, [MAR Hold] ondansetron **OR** [MAR Hold] ondansetron (ZOFRAN) IV, [MAR Hold] mouth rinse  Assessment/ Plan:  Victor Hernandez is a 67 y.o.  male with past medical conditions including diabetes, hyperlipidemia, hypertension, congestive heart failure,  and chronic kidney disease stage IV.  He presents to the emergency department due to abnormal labs and has been admitted for Acute renal failure (HCC) [N17.9] ESRD (end stage renal disease) (HCC) [N18.6] ESRD needing dialysis (HCC) [N18.6, Z99.2]   End-stage renal disease requiring hemodialysis with Volume overload.    Long-term plan includes PD catheter placement with transition to peritoneal dialysis.  Renal navigator informed of patient and will begin seeking outpatient clinic.  Appreciate vascular surgery assistance for placing temporary dialysis catheter.  He has tolerated his first dialysis treatment well (03/14/2023).  Tolerated second treatment well yesterday. Appreciate vascular surgery placing tunneled access today. Next dialysis treatment scheduled for Saturday with fluid removal.   2. Anemia of chronic kidney disease Lab Results  Component Value Date   HGB 8.8 (L) 03/15/2023    Hgb decreased, will continue to monitor as assess need for ESA..   3. Secondary Hyperparathyroidism: with outpatient labs: PTH 1123, phosphorus 6.8, calcium 9.2 on 04/17/22.   Lab Results  Component Value Date   CALCIUM 8.4 (L) 03/16/2023   PHOS 5.3 (H) 03/16/2023  Bone minerals acceptable.  Continue oral calcitriol and phosphorus binders.  4.  Hypertension with chronic kidney disease.  Outpatient regimen includes amlodipine, hydralazine, metoprolol, and torsemide.  Receiving these medications.   5. Diabetes mellitus type II with chronic kidney disease/renal manifestations: noninsulin dependent. Home regimen includes [place]. Most recent hemoglobin A1c is 8.5 on 03/13/2023.  -Agree with discontinuing Marcelline Deist and Finerenone as now he is on dialysis. - Glucose elevated at times. Continue to manage with SSI    LOS: 4 Yilin Weedon 10/18/20241:01 PM

## 2023-03-16 NOTE — Plan of Care (Signed)
[facility] Tonsillectomy/Adenoidectomy, Postoperative PEDS care plan entered in error.

## 2023-03-16 NOTE — Progress Notes (Signed)
Went to visit new ESRD patient to give education materials, patient not in room. He is scheduled for his perm cath placement today.  Education packet left on patients table. Will attempt to see patient later.   Jean Rosenthal Dialysis Nurse Coordinator [phone]

## 2023-03-16 NOTE — Progress Notes (Addendum)
Progress Note   Patient: Victor Hernandez VHQ:[id number] DOB: [birth year] DOA: 03/12/2023     4 DOS: the patient was seen and examined on 03/16/2023   Brief hospital course:  Victor Hernandez is a 67 y.o. male with medical history significant for hypertension, insulin-dependent type 2 diabetes mellitus, HFpEF, severe mitral regurg on echo 2022, CKD stage IV-V.   Presented to ED from PCP for abnormal blood work, specifically worsening renal function.  Patient had been reporting increasing lower extremity edema dyspnea on exertion and orthopnea.    ED Course: BP 194/119 on arrival with pulse in the 90s and low 100s and tachypneic at times to 33 with O2 sat intermittently dipping to as low as 86% on room air. Labs: BNP 2518,WBC, hgb 9.5 which appears to be his baseline CMP: Cr 7.28 with normal bicarb of 23 and potassium 4.1, GFR 8 EKG not done Chest x-ray: pulmonary vascular congestion Patient was treated with IV labetalol oral hydralazine and metoprolol and also given a dose of IV Lasix.    Nephrology was consulted    Patient was admitted to medicine service for further workup and management of hypertensive emergency with pulmonary edema and acute renal failure.   Further hospital course and management as outlined in detail below.   10/15 -- R femoral temp dialysis line placed by Vascular surgery 10/16 -- pt tolerated initial dialysis session this AM 10/17 -- dialysis 10/18 -- perm cath placement cancelled.  Short runs of NSVT (9 and 12 beats), pt asymptomatic.     Assessment and Plan:  Hypertensive emergency- 190 systolics on admission - continue home amlodipine and hydralazine.  - titrate regimen for adequate BP control - monitor for fluctuation with initiation of HD   Acute respiratory failure with hypoxia, POA- resolved. secondary to vascular congestion  / fluid overload (multifactorial with severe range HTN and worsening renal function).  No oxygen use at baseline. originally on  2L O2, now on room air.  Denies respiratory distress, SOB with ambulating to the bathroom - continue IV lasix. Endorses good UOP - echo on 10/15 showed EF 45-50%, grade III diastolic dysfunction, mod-severe MR, mild-mod TR, small pericardial effusion - volume management now via hemodialysis   ESRD needing dialysis (HCC)- Cr greatly worsened and having hypervolemia with end-organ failure. Endorses 3 voids today which was not recorded. Endorses improvement in his LE edema since admission - continue lasix Nephrology consulted and recommended initiation of HD.  Vascular consulted- R femoralTDC placed 10/15 Dialysis initiated 10/16 Vascular surgery placing PermCath today - strict I/O - BMP am  PM Addendum Non-sustained V-tach - 10/18, two runs reported, 9 and 12 beats.  Pt was asymptomatic.  K and Mg are at goal. --Telemetry --Maintain K>4, Mg>2 --Cardiology consult in AM (f/b [facility])     Acute on chronic diastolic CHF  (HCC)- echo read pending - continue home metoprolol   Diabetes mellitus, type II (HCC) Sliding scale insulin coverage   Anemia of chronic kidney failure, stage 5 (HCC) Appears at baseline - CBC am        Subjective: Pt seen this AM. Denies complaints but is minimally interactive for me.  Vascular to place perm cath today.  Yesterday pt got more confused per nursing, and mental status has waxed and waned.     Physical Exam: Vitals:   03/16/23 0341 03/16/23 0347 03/16/23 0745 03/16/23 1019  BP: 137/86  (!) 141/73 (!) 145/83  Pulse: 68  61 (!) 57  Resp: 17  16 (!)  24  Temp: 97.9 F (36.6 C)  98.1 F (36.7 C) 97.9 F (36.6 C)  TempSrc: Oral  Oral Oral  SpO2: 97%  98% 95%  Weight:  89 kg  89 kg  Height:    5\' 11"  (1.803 m)   General exam: awake, no acute distress, seems mildly conffused HEENT: moist mucus membranes, hearing grossly normal  Respiratory system: om room air, normal respiratory effort. Cardiovascular system: RRR, trace pedal edema.    Gastrointestinal system: soft, NT, ND Central nervous system: no gross focal neurologic deficits, limited exam Extremities: R fem TDC present,  moves all equally Psychiatry: normal mood, flat affect, exam limited by minimal interaction from pt   Data Reviewed:  Notable labs ---  glucose 201, BUN 75, Cr 5.72, Ca 8.4, phos 5.3, albumin 2.5   Family Communication: at bedside on rounds 10/16, updated  Disposition: Status is: Inpatient Remains inpatient appropriate because: initiated on hemodialysis, will remain admitted pending outpatient dialysis chair time.   Planned Discharge Destination: Home    Time spent: 35 minutes  Author: Pennie Banter, DO 03/16/2023 1:23 PM  For on call review [email or web address].

## 2023-03-16 NOTE — Plan of Care (Signed)
  Problem: Education: Goal: Ability to demonstrate management of disease process will improve Outcome: Progressing Goal: Ability to verbalize understanding of medication therapies will improve Outcome: Progressing Goal: Individualized Educational Video(s) Outcome: Progressing   Problem: Activity: Goal: Capacity to carry out activities will improve Outcome: Progressing   Problem: Cardiac: Goal: Ability to achieve and maintain adequate cardiopulmonary perfusion will improve Outcome: Progressing   Problem: Education: Goal: Ability to describe self-care measures that may prevent or decrease complications (Diabetes Survival Skills Education) will improve Outcome: Progressing Goal: Individualized Educational Video(s) Outcome: Progressing   Problem: Coping: Goal: Ability to adjust to condition or change in health will improve Outcome: Progressing   Problem: Fluid Volume: Goal: Ability to maintain a balanced intake and output will improve Outcome: Progressing   Problem: Health Behavior/Discharge Planning: Goal: Ability to identify and utilize available resources and services will improve Outcome: Progressing Goal: Ability to manage health-related needs will improve Outcome: Progressing   Problem: Metabolic: Goal: Ability to maintain appropriate glucose levels will improve Outcome: Progressing   Problem: Nutritional: Goal: Maintenance of adequate nutrition will improve Outcome: Progressing Goal: Progress toward achieving an optimal weight will improve Outcome: Progressing   Problem: Skin Integrity: Goal: Risk for impaired skin integrity will decrease Outcome: Progressing   Problem: Tissue Perfusion: Goal: Adequacy of tissue perfusion will improve Outcome: Progressing   Problem: Education: Goal: Knowledge of General Education information will improve Description: Including pain rating scale, medication(s)/side effects and non-pharmacologic comfort measures Outcome:  Progressing   Problem: Health Behavior/Discharge Planning: Goal: Ability to manage health-related needs will improve Outcome: Progressing   Problem: Clinical Measurements: Goal: Ability to maintain clinical measurements within normal limits will improve Outcome: Progressing Goal: Will remain free from infection Outcome: Progressing Goal: Diagnostic test results will improve Outcome: Progressing Goal: Respiratory complications will improve Outcome: Progressing Goal: Cardiovascular complication will be avoided Outcome: Progressing   Problem: Activity: Goal: Risk for activity intolerance will decrease Outcome: Progressing   Problem: Nutrition: Goal: Adequate nutrition will be maintained Outcome: Progressing   Problem: Coping: Goal: Level of anxiety will decrease Outcome: Progressing   Problem: Elimination: Goal: Will not experience complications related to bowel motility Outcome: Progressing Goal: Will not experience complications related to urinary retention Outcome: Progressing   Problem: Pain Managment: Goal: General experience of comfort will improve Outcome: Progressing   Problem: Safety: Goal: Ability to remain free from injury will improve Outcome: Progressing   Problem: Skin Integrity: Goal: Risk for impaired skin integrity will decrease Outcome: Progressing   Problem: Education: Goal: Knowledge of disease and its progression will improve Outcome: Progressing   Problem: Fluid Volume: Goal: Compliance with measures to maintain balanced fluid volume will improve Outcome: Progressing   Problem: Health Behavior/Discharge Planning: Goal: Ability to manage health-related needs will improve Outcome: Progressing   Problem: Nutritional: Goal: Ability to make healthy dietary choices will improve Outcome: Progressing   Problem: Clinical Measurements: Goal: Complications related to the disease process, condition or treatment will be avoided or  minimized Outcome: Progressing   Problem: Education: Goal: Understanding of post-operative needs will improve Outcome: Progressing Goal: Individualized Educational Video(s) Outcome: Progressing   Problem: Clinical Measurements: Goal: Postoperative complications will be avoided or minimized Outcome: Progressing   Problem: Respiratory: Goal: Will regain and/or maintain adequate ventilation Outcome: Progressing

## 2023-03-16 NOTE — Progress Notes (Signed)
Patient's procedure was cancelled. Report called to [facility].  Patient transport picked up patient.

## 2023-03-17 DIAGNOSIS — I340 Nonrheumatic mitral (valve) insufficiency: Secondary | ICD-10-CM | POA: Diagnosis not present

## 2023-03-17 DIAGNOSIS — I4729 Other ventricular tachycardia: Secondary | ICD-10-CM

## 2023-03-17 DIAGNOSIS — I5033 Acute on chronic diastolic (congestive) heart failure: Secondary | ICD-10-CM | POA: Diagnosis not present

## 2023-03-17 DIAGNOSIS — I10 Essential (primary) hypertension: Secondary | ICD-10-CM | POA: Diagnosis not present

## 2023-03-17 LAB — CBC
HCT: 27.4 % — ABNORMAL LOW (ref 39.0–52.0)
Hemoglobin: 8.2 g/dL — ABNORMAL LOW (ref 13.0–17.0)
MCH: 26.3 pg (ref 26.0–34.0)
MCHC: 29.9 g/dL — ABNORMAL LOW (ref 30.0–36.0)
MCV: 87.8 fL (ref 80.0–100.0)
Platelets: 211 10*3/uL (ref 150–400)
RBC: 3.12 MIL/uL — ABNORMAL LOW (ref 4.22–5.81)
RDW: 17.2 % — ABNORMAL HIGH (ref 11.5–15.5)
WBC: 5.9 10*3/uL (ref 4.0–10.5)
nRBC: 0 % (ref 0.0–0.2)

## 2023-03-17 LAB — RENAL FUNCTION PANEL
Albumin: 2.5 g/dL — ABNORMAL LOW (ref 3.5–5.0)
Anion gap: 11 (ref 5–15)
BUN: 85 mg/dL — ABNORMAL HIGH (ref 8–23)
CO2: 26 mmol/L (ref 22–32)
Calcium: 8.6 mg/dL — ABNORMAL LOW (ref 8.9–10.3)
Chloride: 107 mmol/L (ref 98–111)
Creatinine, Ser: 6.07 mg/dL — ABNORMAL HIGH (ref 0.61–1.24)
GFR, Estimated: 9 mL/min — ABNORMAL LOW (ref >60)
Glucose, Bld: 120 mg/dL — ABNORMAL HIGH (ref 70–99)
Phosphorus: 6 mg/dL — ABNORMAL HIGH (ref 2.5–4.6)
Potassium: 4.4 mmol/L (ref 3.5–5.1)
Sodium: 144 mmol/L (ref 135–145)

## 2023-03-17 LAB — GLUCOSE, CAPILLARY
Glucose-Capillary: 119 mg/dL — ABNORMAL HIGH (ref 70–99)
Glucose-Capillary: 154 mg/dL — ABNORMAL HIGH (ref 70–99)
Glucose-Capillary: 167 mg/dL — ABNORMAL HIGH (ref 70–99)
Glucose-Capillary: 269 mg/dL — ABNORMAL HIGH (ref 70–99)
Glucose-Capillary: 80 mg/dL (ref 70–99)

## 2023-03-17 MED ORDER — METOPROLOL TARTRATE 50 MG PO TABS
50.0000 mg | ORAL_TABLET | Freq: Two times a day (BID) | ORAL | Status: DC
  Administered 2023-03-17 – 2023-03-20 (×5): 50 mg via ORAL
  Filled 2023-03-17 (×6): qty 1

## 2023-03-17 MED ORDER — QUETIAPINE FUMARATE 25 MG PO TABS
25.0000 mg | ORAL_TABLET | Freq: Two times a day (BID) | ORAL | Status: DC
  Administered 2023-03-17 – 2023-03-20 (×7): 25 mg via ORAL
  Filled 2023-03-17 (×7): qty 1

## 2023-03-17 MED ORDER — HEPARIN SODIUM (PORCINE) 1000 UNIT/ML DIALYSIS: 1000.0000 [IU] | INTRAMUSCULAR | Status: DC | PRN

## 2023-03-17 MED ORDER — ALTEPLASE 2 MG IJ SOLR: 2.0000 mg | Freq: Once | INTRAMUSCULAR | Status: DC | PRN

## 2023-03-17 NOTE — Progress Notes (Signed)
Hemodialysis note  Received patient in Dialysis recliner to unit. Alert and oriented.  Informed consent signed and in chart.  Treatment initiated: 1500 Treatment completed: 1837  Patient tolerated well. Transported back to room, alert without acute distress.  Report given to patient's RN.   Access used: Right Femoral Temp Cath Access issues: none  Total UF removed: 2L Medication(s) given:  none  Post HD weight: 92.1 kg   [facility]

## 2023-03-17 NOTE — Progress Notes (Signed)
Progress Note   Patient: Victor Hernandez:[id number] DOB: [birth year] DOA: 03/12/2023     5 DOS: the patient was seen and examined on 03/17/2023   Brief hospital course:  SETON ALDRIDGE is a 67 y.o. male with medical history significant for hypertension, insulin-dependent type 2 diabetes mellitus, HFpEF, severe mitral regurg on echo 2022, CKD stage IV-V.   Presented to ED from PCP for abnormal blood work, specifically worsening renal function.  Patient had been reporting increasing lower extremity edema dyspnea on exertion and orthopnea.    ED Course: BP 194/119 on arrival with pulse in the 90s and low 100s and tachypneic at times to 33 with O2 sat intermittently dipping to as low as 86% on room air. Labs: BNP 2518,WBC, hgb 9.5 which appears to be his baseline CMP: Cr 7.28 with normal bicarb of 23 and potassium 4.1, GFR 8 EKG not done Chest x-ray: pulmonary vascular congestion Patient was treated with IV labetalol oral hydralazine and metoprolol and also given a dose of IV Lasix.    Nephrology was consulted    Patient was admitted to medicine service for further workup and management of hypertensive emergency with pulmonary edema and acute renal failure.   Further hospital course and management as outlined in detail below.   10/15 -- R femoral temp dialysis line placed by Vascular surgery 10/16 -- pt tolerated initial dialysis session this AM 10/17 -- dialysis 10/18 -- perm cath placement cancelled.  Short runs of NSVT (9 and 12 beats), pt asymptomatic.     Assessment and Plan:  Hypertensive emergency- 190 systolics on admission - continue home amlodipine and hydralazine.  - titrate regimen for adequate BP control - monitor for fluctuation with initiation of HD   Acute respiratory failure with hypoxia, POA- resolved. secondary to vascular congestion  / fluid overload (multifactorial with severe range HTN and worsening renal function).  No oxygen use at baseline. originally on  2L O2, now on room air.  Denies respiratory distress, SOB with ambulating to the bathroom - continue IV lasix. Endorses good UOP - echo on 10/15 showed EF 45-50%, grade III diastolic dysfunction, mod-severe MR, mild-mod TR, small pericardial effusion - volume management now via hemodialysis   ESRD needing dialysis (HCC)- Cr greatly worsened and having hypervolemia with end-organ failure. Endorses 3 voids today which was not recorded. Endorses improvement in his LE edema since admission - continue lasix Nephrology consulted and recommended initiation of HD.  Vascular consulted- R femoralTDC placed 10/15 Dialysis initiated 10/16 Vascular surgery placing PermCath today - strict I/O - BMP am  PM Addendum Non-sustained V-tach - 10/18, two runs reported, 9 and 12 beats.  Pt was asymptomatic.  K and Mg are at goal. Likely due to renal failure. --Telemetry --Maintain K>4, Mg>2 --Cardiology consulted --Metoprolol increased     Acute on chronic diastolic CHF  (HCC)- echo read pending - continue home metoprolol   Diabetes mellitus, type II (HCC) Sliding scale insulin coverage   Anemia of chronic kidney failure, stage 5 (HCC) Appears at baseline - CBC am        Subjective: Pt seen this AM sitting edge of bed.  Per RN, he refused his AM meds, vitals and to get dialysis today.  Pt convinced people here trying to harm him.  He does state poor understanding of everything going on with him.  Spent time explaining and he said he felt better, but still relunctant to agree to getting perm cath.  He reports something was smoking  in the room, felt the staff in procedure room were behaving odd and suspicious.  Has paranoid thinking about people conspiring against him here.   Ultimately agree to take meds and get dialysis   Physical Exam: Vitals:   03/17/23 1800 03/17/23 1830 03/17/23 1837 03/17/23 1842  BP: (!) 141/87 (!) 153/84 (!) 156/133 (!) 150/95  Pulse: 73 63 88 91  Resp: (!) 23 16 (!)  23 (!) 25  Temp:   97.6 F (36.4 C)   TempSrc:   Oral   SpO2: 97% 100% 94% 95%  Weight:    92.1 kg  Height:       General exam: awake, no acute distress, mildly conffused / paranoid HEENT: moist mucus membranes, hearing grossly normal  Respiratory system: om room air, normal respiratory effort. Cardiovascular system: RRR, trace LE edema.   Gastrointestinal system: soft, NT, ND Central nervous system: no gross focal neurologic deficits, limited exam Extremities: R fem TDC present,  moves all equally Psychiatry: normal mood, flat affect, abnormal judgment and insight with paranoid thinking, no apparent VH/AH   Data Reviewed:  Notable labs ---  glucose 120, BUN 85, Cr 6.07, Ca 8.6, phos 6.0, albumin 2.5 Hbg 8.2  Family Communication: at bedside on rounds 10/16, updated  Disposition: Status is: Inpatient Remains inpatient appropriate because: initiated on hemodialysis, will remain admitted pending outpatient dialysis chair time.   Planned Discharge Destination: Home    Time spent: 35 minutes  Author: Pennie Banter, DO 03/17/2023 8:09 PM  For on call review [email or web address].

## 2023-03-17 NOTE — Consult Note (Signed)
Cardiology Consultation   Patient ID: STEHEN Hernandez MRN: [id number]; DOB: August 25, 1955  Admit date: 03/12/2023 Date of Consult: 03/17/2023  PCP:  Armando Gang, FNP   [facility] Providers Cardiologist:  None        Patient Profile:   Victor Hernandez is a 67 y.o. male with a hx of HFpEF who is being seen 03/17/2023 for the evaluation of NSVT at the request of Dr. Denton Lank.  History of Present Illness:   [name] is a 67 year old male with history of HFpEF, hypertension, diabetes, CKD presenting to the hospital with worsening renal function.  Had outpatient blood work showing worsening kidney disease, admitted for dialysis initiation.  Tunneled cath was placed by vascular surgery, tolerated 2 dialysis sessions.  Third dialysis session scheduled for today as per nephrology.  Cardiac monitor this morning showed 2 runs of NSVT lasting 3 and 6 beats.  He is feeling traumatized after noticing another dialysis patient passing.  Otherwise states doing okay, no concerns at this time.  Endorses palpitations earlier this morning.  Echocardiogram 10/24 EF 45 to 50%,Grade 3 diastolic dysfunction, moderate to severe MR.   Past Medical History:  Diagnosis Date   CHF (congestive heart failure) (HCC)    Chronic kidney disease    Diabetes mellitus without complication (HCC)    Hyperlipidemia    Hypertension     Past Surgical History:  Procedure Laterality Date   COLONOSCOPY     COLONOSCOPY WITH PROPOFOL N/A 05/19/2016   Procedure: COLONOSCOPY WITH PROPOFOL;  Surgeon: Kieth Brightly, MD;  Location: [facility] ENDOSCOPY;  Service: Endoscopy;  Laterality: N/A;   TEMPORARY DIALYSIS CATHETER N/A 03/13/2023   Procedure: TEMPORARY DIALYSIS CATHETER;  Surgeon: Renford Dills, MD;  Location: [facility];  Service: Cardiovascular;  Laterality: N/A;     Home Medications:  Prior to Admission medications   Medication Sig Start Date End Date Taking? Authorizing Provider   acetaminophen (TYLENOL) 325 MG tablet Take 650 mg by mouth every 6 (six) hours as needed.   Yes [provider]  aspirin EC 81 MG tablet Take 1 tablet (81 mg total) by mouth daily. 07/12/20  Yes Arnetha Courser, MD  atorvastatin (LIPITOR) 40 MG tablet Take 40 mg by mouth every morning. 02/24/20  Yes [provider]  Ferrous Sulfate (SLOW FE) 142 (45 Fe) MG TBCR Take 1 tablet by mouth daily.   Yes [provider]  insulin aspart (NOVOLOG) 100 UNIT/ML injection Inject 4-12 Units into the skin as directed. Sliding scale 150-200 = 4 units 201-250 = 6 units 251-300 = 8 units 301-350 = 10 units 351-400 = 12 units   Yes [provider]  torsemide (DEMADEX) 100 MG tablet Take 1 tablet (100 mg total) by mouth daily. Patient taking differently: Take 50 mg by mouth daily. 07/13/20  Yes Arnetha Courser, MD  amLODipine (NORVASC) 10 MG tablet Take 1 tablet (10 mg total) by mouth daily. 03/30/22 04/29/22  Sharman Cheek, MD  blood glucose meter kit and supplies Dispense based on patient and insurance preference. Use up to four times daily as directed. (FOR ICD-10 E10.9, E11.9). 06/02/20   Lurene Shadow, MD  dapagliflozin propanediol (FARXIGA) 10 MG TABS tablet Take 1 tablet (10 mg total) by mouth daily. Patient not taking: Reported on 03/13/2023 09/08/21   Delma Freeze, FNP  Finerenone (KERENDIA) 10 MG TABS Take 10 mg by mouth daily. Patient not taking: Reported on 03/13/2023    [provider]  hydrALAZINE (  Cardiology Consultation   Patient ID: STEHEN Hernandez MRN: [id number]; DOB: August 25, 1955  Admit date: 03/12/2023 Date of Consult: 03/17/2023  PCP:  Armando Gang, FNP   [facility] Providers Cardiologist:  None        Patient Profile:   Victor Hernandez is a 67 y.o. male with a hx of HFpEF who is being seen 03/17/2023 for the evaluation of NSVT at the request of Dr. Denton Lank.  History of Present Illness:   [name] is a 67 year old male with history of HFpEF, hypertension, diabetes, CKD presenting to the hospital with worsening renal function.  Had outpatient blood work showing worsening kidney disease, admitted for dialysis initiation.  Tunneled cath was placed by vascular surgery, tolerated 2 dialysis sessions.  Third dialysis session scheduled for today as per nephrology.  Cardiac monitor this morning showed 2 runs of NSVT lasting 3 and 6 beats.  He is feeling traumatized after noticing another dialysis patient passing.  Otherwise states doing okay, no concerns at this time.  Endorses palpitations earlier this morning.  Echocardiogram 10/24 EF 45 to 50%,Grade 3 diastolic dysfunction, moderate to severe MR.   Past Medical History:  Diagnosis Date   CHF (congestive heart failure) (HCC)    Chronic kidney disease    Diabetes mellitus without complication (HCC)    Hyperlipidemia    Hypertension     Past Surgical History:  Procedure Laterality Date   COLONOSCOPY     COLONOSCOPY WITH PROPOFOL N/A 05/19/2016   Procedure: COLONOSCOPY WITH PROPOFOL;  Surgeon: Kieth Brightly, MD;  Location: [facility];  Service: Endoscopy;  Laterality: N/A;   TEMPORARY DIALYSIS CATHETER N/A 03/13/2023   Procedure: TEMPORARY DIALYSIS CATHETER;  Surgeon: Renford Dills, MD;  Location: [facility];  Service: Cardiovascular;  Laterality: N/A;     Home Medications:  Prior to Admission medications   Medication Sig Start Date End Date Taking? Authorizing Provider   acetaminophen (TYLENOL) 325 MG tablet Take 650 mg by mouth every 6 (six) hours as needed.   Yes [provider]  aspirin EC 81 MG tablet Take 1 tablet (81 mg total) by mouth daily. 07/12/20  Yes Arnetha Courser, MD  atorvastatin (LIPITOR) 40 MG tablet Take 40 mg by mouth every morning. 02/24/20  Yes [provider]  Ferrous Sulfate (SLOW FE) 142 (45 Fe) MG TBCR Take 1 tablet by mouth daily.   Yes [provider]  insulin aspart (NOVOLOG) 100 UNIT/ML injection Inject 4-12 Units into the skin as directed. Sliding scale 150-200 = 4 units 201-250 = 6 units 251-300 = 8 units 301-350 = 10 units 351-400 = 12 units   Yes [provider]  torsemide (DEMADEX) 100 MG tablet Take 1 tablet (100 mg total) by mouth daily. Patient taking differently: Take 50 mg by mouth daily. 07/13/20  Yes Arnetha Courser, MD  amLODipine (NORVASC) 10 MG tablet Take 1 tablet (10 mg total) by mouth daily. 03/30/22 04/29/22  Sharman Cheek, MD  blood glucose meter kit and supplies Dispense based on patient and insurance preference. Use up to four times daily as directed. (FOR ICD-10 E10.9, E11.9). 06/02/20   Lurene Shadow, MD  dapagliflozin propanediol (FARXIGA) 10 MG TABS tablet Take 1 tablet (10 mg total) by mouth daily. Patient not taking: Reported on 03/13/2023 09/08/21   Delma Freeze, FNP  Finerenone (KERENDIA) 10 MG TABS Take 10 mg by mouth daily. Patient not taking: Reported on 03/13/2023    [provider]  hydrALAZINE (  Cardiology Consultation   Patient ID: STEHEN Hernandez MRN: [id number]; DOB: August 25, 1955  Admit date: 03/12/2023 Date of Consult: 03/17/2023  PCP:  Armando Gang, FNP   [facility] Providers Cardiologist:  None        Patient Profile:   Victor Hernandez is a 67 y.o. male with a hx of HFpEF who is being seen 03/17/2023 for the evaluation of NSVT at the request of Dr. Denton Lank.  History of Present Illness:   [name] is a 67 year old male with history of HFpEF, hypertension, diabetes, CKD presenting to the hospital with worsening renal function.  Had outpatient blood work showing worsening kidney disease, admitted for dialysis initiation.  Tunneled cath was placed by vascular surgery, tolerated 2 dialysis sessions.  Third dialysis session scheduled for today as per nephrology.  Cardiac monitor this morning showed 2 runs of NSVT lasting 3 and 6 beats.  He is feeling traumatized after noticing another dialysis patient passing.  Otherwise states doing okay, no concerns at this time.  Endorses palpitations earlier this morning.  Echocardiogram 10/24 EF 45 to 50%,Grade 3 diastolic dysfunction, moderate to severe MR.   Past Medical History:  Diagnosis Date   CHF (congestive heart failure) (HCC)    Chronic kidney disease    Diabetes mellitus without complication (HCC)    Hyperlipidemia    Hypertension     Past Surgical History:  Procedure Laterality Date   COLONOSCOPY     COLONOSCOPY WITH PROPOFOL N/A 05/19/2016   Procedure: COLONOSCOPY WITH PROPOFOL;  Surgeon: Kieth Brightly, MD;  Location: [facility];  Service: Endoscopy;  Laterality: N/A;   TEMPORARY DIALYSIS CATHETER N/A 03/13/2023   Procedure: TEMPORARY DIALYSIS CATHETER;  Surgeon: Renford Dills, MD;  Location: [facility];  Service: Cardiovascular;  Laterality: N/A;     Home Medications:  Prior to Admission medications   Medication Sig Start Date End Date Taking? Authorizing Provider   acetaminophen (TYLENOL) 325 MG tablet Take 650 mg by mouth every 6 (six) hours as needed.   Yes [provider]  aspirin EC 81 MG tablet Take 1 tablet (81 mg total) by mouth daily. 07/12/20  Yes Arnetha Courser, MD  atorvastatin (LIPITOR) 40 MG tablet Take 40 mg by mouth every morning. 02/24/20  Yes [provider]  Ferrous Sulfate (SLOW FE) 142 (45 Fe) MG TBCR Take 1 tablet by mouth daily.   Yes [provider]  insulin aspart (NOVOLOG) 100 UNIT/ML injection Inject 4-12 Units into the skin as directed. Sliding scale 150-200 = 4 units 201-250 = 6 units 251-300 = 8 units 301-350 = 10 units 351-400 = 12 units   Yes [provider]  torsemide (DEMADEX) 100 MG tablet Take 1 tablet (100 mg total) by mouth daily. Patient taking differently: Take 50 mg by mouth daily. 07/13/20  Yes Arnetha Courser, MD  amLODipine (NORVASC) 10 MG tablet Take 1 tablet (10 mg total) by mouth daily. 03/30/22 04/29/22  Sharman Cheek, MD  blood glucose meter kit and supplies Dispense based on patient and insurance preference. Use up to four times daily as directed. (FOR ICD-10 E10.9, E11.9). 06/02/20   Lurene Shadow, MD  dapagliflozin propanediol (FARXIGA) 10 MG TABS tablet Take 1 tablet (10 mg total) by mouth daily. Patient not taking: Reported on 03/13/2023 09/08/21   Delma Freeze, FNP  Finerenone (KERENDIA) 10 MG TABS Take 10 mg by mouth daily. Patient not taking: Reported on 03/13/2023    [provider]  hydrALAZINE (  BILITOT 0.8  --   --   --   --    < > = values in this interval not displayed.   Lipids No results for input(s): "CHOL", "TRIG", "HDL", "LABVLDL", "LDLCALC", "CHOLHDL" in the last 168 hours.  Hematology Recent Labs  Lab 03/12/23 1916 03/14/23 0531 03/15/23 0805  WBC 7.1 6.4 7.3  RBC 3.65* 3.54* 3.33*  HGB 9.5* 9.4* 8.8*  HCT 32.4* 31.6* 29.4*  MCV 88.8 89.3 88.3  MCH 26.0 26.6 26.4  MCHC 29.3* 29.7* 29.9*  RDW 17.0* 17.1* 17.3*  PLT 271 268 251   Thyroid No results for input(s): "TSH", "FREET4" in the last 168 hours.   BNP Recent Labs  Lab 03/12/23 2151  BNP 2,517.8*    DDimer No results for input(s): "DDIMER" in the last 168 hours.   Radiology/Studies:  [place] UE VEIN MAPPING LEFT (PRE-OP AVF)  Result Date: 03/16/2023 CLINICAL DATA:  Preoperative vascular survey prior to arteriovenous fistula creation, end-stage renal disease EXAM: [place] EXTREM UP VEIN MAPPING COMPARISON:  None Available. FINDINGS: LEFT ARTERIES Wrist Radial Artery: Size 2.56mm Waveform Triphasic Wrist Ulnar Artery: Size 1.52mm Waveform Triphasic Prox. Forearm Radial Artery: Size 3.75mm Waveform Triphasic Upper Arm Brachial Artery: Size 5.7mm Waveform Triphasic LEFT VEINS Forearm Cephalic Vein: Prox 6mm Distal 2mm Depth 20mm Upper Arm Cephalic Vein: Prox 4mm Distal 4mm Depth 5mm Upper Arm Basilic Vein: Prox 3mm Distal 3mm Depth 6mm Upper Arm Brachial Vein: Prox 10mm Distal 5mm Depth 5mm ADDITIONAL LEFT VEINS Axillary Vein:  19mm Subclavian Vein: Patient: Yes Respiratory Phasicity: Present Internal Jugular Vein: Patent: Yes    Respiratory Phasicity: Present Branches > 2 mm: At least 4 branches are present measuring 2 mm or larger. All 4 branches arise from the cephalic vein. IMPRESSION: 1. Relatively small radial artery measuring only 2 mm in diameter at the wrist. 2. Widely patent left veins without evidence of central venous obstruction. 3. At least 4 branches measuring larger than 2 mm in diameter arise from the cephalic vein. Electronically Signed   By: Malachy Moan M.D.   On: 03/16/2023 07:02   ECHOCARDIOGRAM COMPLETE  Result Date: 03/13/2023    ECHOCARDIOGRAM REPORT   Patient Name:   Victor Hernandez Date of Exam: 03/13/2023 Medical Rec #:  [id number]    Height:       71.0 in Accession #:    [id number]   Weight:       197.3 lb Date of Birth:  1955-07-22    BSA:          2.096 m Patient Age:    67 years     BP:           152/83 mmHg Patient Gender: M            HR:           69 bpm. Exam Location:  [facility] Procedure: 2D Echo, Cardiac Doppler, Color  Doppler and Strain Analysis Indications:     CHF-acute diastolic I50.31  History:         Patient has no prior history of Echocardiogram examinations,                  most recent 06/02/2020. CHF; Risk Factors:Hypertension and                  Diabetes.  Sonographer:     Cristela Blue Referring Phys:  [id number] Andris Baumann Diagnosing Phys: Yvonne Kendall MD  Sonographer Comments: Global longitudinal strain was attempted.  BILITOT 0.8  --   --   --   --    < > = values in this interval not displayed.   Lipids No results for input(s): "CHOL", "TRIG", "HDL", "LABVLDL", "LDLCALC", "CHOLHDL" in the last 168 hours.  Hematology Recent Labs  Lab 03/12/23 1916 03/14/23 0531 03/15/23 0805  WBC 7.1 6.4 7.3  RBC 3.65* 3.54* 3.33*  HGB 9.5* 9.4* 8.8*  HCT 32.4* 31.6* 29.4*  MCV 88.8 89.3 88.3  MCH 26.0 26.6 26.4  MCHC 29.3* 29.7* 29.9*  RDW 17.0* 17.1* 17.3*  PLT 271 268 251   Thyroid No results for input(s): "TSH", "FREET4" in the last 168 hours.   BNP Recent Labs  Lab 03/12/23 2151  BNP 2,517.8*    DDimer No results for input(s): "DDIMER" in the last 168 hours.   Radiology/Studies:  [place] UE VEIN MAPPING LEFT (PRE-OP AVF)  Result Date: 03/16/2023 CLINICAL DATA:  Preoperative vascular survey prior to arteriovenous fistula creation, end-stage renal disease EXAM: [place] EXTREM UP VEIN MAPPING COMPARISON:  None Available. FINDINGS: LEFT ARTERIES Wrist Radial Artery: Size 2.56mm Waveform Triphasic Wrist Ulnar Artery: Size 1.52mm Waveform Triphasic Prox. Forearm Radial Artery: Size 3.75mm Waveform Triphasic Upper Arm Brachial Artery: Size 5.7mm Waveform Triphasic LEFT VEINS Forearm Cephalic Vein: Prox 6mm Distal 2mm Depth 20mm Upper Arm Cephalic Vein: Prox 4mm Distal 4mm Depth 5mm Upper Arm Basilic Vein: Prox 3mm Distal 3mm Depth 6mm Upper Arm Brachial Vein: Prox 10mm Distal 5mm Depth 5mm ADDITIONAL LEFT VEINS Axillary Vein:  19mm Subclavian Vein: Patient: Yes Respiratory Phasicity: Present Internal Jugular Vein: Patent: Yes    Respiratory Phasicity: Present Branches > 2 mm: At least 4 branches are present measuring 2 mm or larger. All 4 branches arise from the cephalic vein. IMPRESSION: 1. Relatively small radial artery measuring only 2 mm in diameter at the wrist. 2. Widely patent left veins without evidence of central venous obstruction. 3. At least 4 branches measuring larger than 2 mm in diameter arise from the cephalic vein. Electronically Signed   By: Malachy Moan M.D.   On: 03/16/2023 07:02   ECHOCARDIOGRAM COMPLETE  Result Date: 03/13/2023    ECHOCARDIOGRAM REPORT   Patient Name:   Victor Hernandez Date of Exam: 03/13/2023 Medical Rec #:  [id number]    Height:       71.0 in Accession #:    [id number]   Weight:       197.3 lb Date of Birth:  1955-07-22    BSA:          2.096 m Patient Age:    67 years     BP:           152/83 mmHg Patient Gender: M            HR:           69 bpm. Exam Location:  [facility] Procedure: 2D Echo, Cardiac Doppler, Color  Doppler and Strain Analysis Indications:     CHF-acute diastolic I50.31  History:         Patient has no prior history of Echocardiogram examinations,                  most recent 06/02/2020. CHF; Risk Factors:Hypertension and                  Diabetes.  Sonographer:     Cristela Blue Referring Phys:  [id number] Andris Baumann Diagnosing Phys: Yvonne Kendall MD  Sonographer Comments: Global longitudinal strain was attempted.  BILITOT 0.8  --   --   --   --    < > = values in this interval not displayed.   Lipids No results for input(s): "CHOL", "TRIG", "HDL", "LABVLDL", "LDLCALC", "CHOLHDL" in the last 168 hours.  Hematology Recent Labs  Lab 03/12/23 1916 03/14/23 0531 03/15/23 0805  WBC 7.1 6.4 7.3  RBC 3.65* 3.54* 3.33*  HGB 9.5* 9.4* 8.8*  HCT 32.4* 31.6* 29.4*  MCV 88.8 89.3 88.3  MCH 26.0 26.6 26.4  MCHC 29.3* 29.7* 29.9*  RDW 17.0* 17.1* 17.3*  PLT 271 268 251   Thyroid No results for input(s): "TSH", "FREET4" in the last 168 hours.   BNP Recent Labs  Lab 03/12/23 2151  BNP 2,517.8*    DDimer No results for input(s): "DDIMER" in the last 168 hours.   Radiology/Studies:  [place] UE VEIN MAPPING LEFT (PRE-OP AVF)  Result Date: 03/16/2023 CLINICAL DATA:  Preoperative vascular survey prior to arteriovenous fistula creation, end-stage renal disease EXAM: [place] EXTREM UP VEIN MAPPING COMPARISON:  None Available. FINDINGS: LEFT ARTERIES Wrist Radial Artery: Size 2.56mm Waveform Triphasic Wrist Ulnar Artery: Size 1.52mm Waveform Triphasic Prox. Forearm Radial Artery: Size 3.75mm Waveform Triphasic Upper Arm Brachial Artery: Size 5.7mm Waveform Triphasic LEFT VEINS Forearm Cephalic Vein: Prox 6mm Distal 2mm Depth 20mm Upper Arm Cephalic Vein: Prox 4mm Distal 4mm Depth 5mm Upper Arm Basilic Vein: Prox 3mm Distal 3mm Depth 6mm Upper Arm Brachial Vein: Prox 10mm Distal 5mm Depth 5mm ADDITIONAL LEFT VEINS Axillary Vein:  19mm Subclavian Vein: Patient: Yes Respiratory Phasicity: Present Internal Jugular Vein: Patent: Yes    Respiratory Phasicity: Present Branches > 2 mm: At least 4 branches are present measuring 2 mm or larger. All 4 branches arise from the cephalic vein. IMPRESSION: 1. Relatively small radial artery measuring only 2 mm in diameter at the wrist. 2. Widely patent left veins without evidence of central venous obstruction. 3. At least 4 branches measuring larger than 2 mm in diameter arise from the cephalic vein. Electronically Signed   By: Malachy Moan M.D.   On: 03/16/2023 07:02   ECHOCARDIOGRAM COMPLETE  Result Date: 03/13/2023    ECHOCARDIOGRAM REPORT   Patient Name:   Victor Hernandez Date of Exam: 03/13/2023 Medical Rec #:  [id number]    Height:       71.0 in Accession #:    [id number]   Weight:       197.3 lb Date of Birth:  1955-07-22    BSA:          2.096 m Patient Age:    67 years     BP:           152/83 mmHg Patient Gender: M            HR:           69 bpm. Exam Location:  [facility] Procedure: 2D Echo, Cardiac Doppler, Color  Doppler and Strain Analysis Indications:     CHF-acute diastolic I50.31  History:         Patient has no prior history of Echocardiogram examinations,                  most recent 06/02/2020. CHF; Risk Factors:Hypertension and                  Diabetes.  Sonographer:     Cristela Blue Referring Phys:  [id number] Andris Baumann Diagnosing Phys: Yvonne Kendall MD  Sonographer Comments: Global longitudinal strain was attempted.  BILITOT 0.8  --   --   --   --    < > = values in this interval not displayed.   Lipids No results for input(s): "CHOL", "TRIG", "HDL", "LABVLDL", "LDLCALC", "CHOLHDL" in the last 168 hours.  Hematology Recent Labs  Lab 03/12/23 1916 03/14/23 0531 03/15/23 0805  WBC 7.1 6.4 7.3  RBC 3.65* 3.54* 3.33*  HGB 9.5* 9.4* 8.8*  HCT 32.4* 31.6* 29.4*  MCV 88.8 89.3 88.3  MCH 26.0 26.6 26.4  MCHC 29.3* 29.7* 29.9*  RDW 17.0* 17.1* 17.3*  PLT 271 268 251   Thyroid No results for input(s): "TSH", "FREET4" in the last 168 hours.   BNP Recent Labs  Lab 03/12/23 2151  BNP 2,517.8*    DDimer No results for input(s): "DDIMER" in the last 168 hours.   Radiology/Studies:  [place] UE VEIN MAPPING LEFT (PRE-OP AVF)  Result Date: 03/16/2023 CLINICAL DATA:  Preoperative vascular survey prior to arteriovenous fistula creation, end-stage renal disease EXAM: [place] EXTREM UP VEIN MAPPING COMPARISON:  None Available. FINDINGS: LEFT ARTERIES Wrist Radial Artery: Size 2.56mm Waveform Triphasic Wrist Ulnar Artery: Size 1.52mm Waveform Triphasic Prox. Forearm Radial Artery: Size 3.75mm Waveform Triphasic Upper Arm Brachial Artery: Size 5.7mm Waveform Triphasic LEFT VEINS Forearm Cephalic Vein: Prox 6mm Distal 2mm Depth 20mm Upper Arm Cephalic Vein: Prox 4mm Distal 4mm Depth 5mm Upper Arm Basilic Vein: Prox 3mm Distal 3mm Depth 6mm Upper Arm Brachial Vein: Prox 10mm Distal 5mm Depth 5mm ADDITIONAL LEFT VEINS Axillary Vein:  19mm Subclavian Vein: Patient: Yes Respiratory Phasicity: Present Internal Jugular Vein: Patent: Yes    Respiratory Phasicity: Present Branches > 2 mm: At least 4 branches are present measuring 2 mm or larger. All 4 branches arise from the cephalic vein. IMPRESSION: 1. Relatively small radial artery measuring only 2 mm in diameter at the wrist. 2. Widely patent left veins without evidence of central venous obstruction. 3. At least 4 branches measuring larger than 2 mm in diameter arise from the cephalic vein. Electronically Signed   By: Malachy Moan M.D.   On: 03/16/2023 07:02   ECHOCARDIOGRAM COMPLETE  Result Date: 03/13/2023    ECHOCARDIOGRAM REPORT   Patient Name:   Victor Hernandez Date of Exam: 03/13/2023 Medical Rec #:  [id number]    Height:       71.0 in Accession #:    [id number]   Weight:       197.3 lb Date of Birth:  1955-07-22    BSA:          2.096 m Patient Age:    67 years     BP:           152/83 mmHg Patient Gender: M            HR:           69 bpm. Exam Location:  [facility] Procedure: 2D Echo, Cardiac Doppler, Color  Doppler and Strain Analysis Indications:     CHF-acute diastolic I50.31  History:         Patient has no prior history of Echocardiogram examinations,                  most recent 06/02/2020. CHF; Risk Factors:Hypertension and                  Diabetes.  Sonographer:     Cristela Blue Referring Phys:  [id number] Andris Baumann Diagnosing Phys: Yvonne Kendall MD  Sonographer Comments: Global longitudinal strain was attempted.

## 2023-03-17 NOTE — Progress Notes (Signed)
[facility]  PROGRESS NOTE   Subjective:   67 year old male with history of hypertension, coronary artery disease, congestive heart failure, diabetes, peripheral vascular disease and now end-stage renal disease started on dialysis treatment. Patient had 2 dialysis treatments so far.  Is scheduled for the third treatment today. Patient has been very paranoid and has been suspicious of everything.  Objective:  Vital signs: Blood pressure (!) 152/81, pulse 63, temperature 98 F (36.7 C), temperature source Oral, resp. rate 16, height 5\' 11"  (1.803 m), weight 87.5 kg, SpO2 97%. No intake or output data in the 24 hours ending 03/17/23 1352 Filed Weights   03/16/23 0347 03/16/23 1019 03/17/23 0500  Weight: 89 kg 89 kg 87.5 kg     Physical Exam: General:  No acute distress  Head:  Normocephalic, atraumatic. Moist oral mucosal membranes  Eyes:  Anicteric  Neck:  Supple  Lungs:   Clear to auscultation, normal effort  Heart:  S1S2 no rubs  Abdomen:   Soft, nontender, bowel sounds present  Extremities:  peripheral edema.  Neurologic:  Awake, alert, following commands  Skin:  No lesions  Access:     Basic Metabolic Panel: Recent Labs  Lab 03/12/23 2151 03/13/23 0500 03/14/23 0531 03/15/23 0526 03/16/23 0627 03/17/23 0500  NA  --  143 145 144 141 144  K  --  3.9 4.3 4.5 4.3 4.4  CL  --  108 109 109 106 107  CO2  --  23 24 25 26 26   GLUCOSE  --  137* 113* 135* 201* 120*  BUN  --  97* 98* 81* 75* 85*  CREATININE  --  7.15* 7.53* 6.28* 5.72* 6.07*  CALCIUM  --  8.5* 8.0* 8.0* 8.4* 8.6*  MG 2.2  --   --   --  2.0  --   PHOS  --   --  7.3* 6.8* 5.3* 6.0*   GFR: Estimated Creatinine Clearance: 12.2 mL/min (A) (by C-G formula based on SCr of 6.07 mg/dL (H)).  Liver Function Tests: Recent Labs  Lab 03/12/23 1916 03/14/23 0531 03/15/23 0526 03/16/23 0627 03/17/23 0500  AST 17  --   --   --   --   ALT 23  --   --   --   --   ALKPHOS 117  --   --   --   --    BILITOT 0.8  --   --   --   --   PROT 6.0*  --   --   --   --   ALBUMIN 2.7* 2.5* 2.4* 2.5* 2.5*   No results for input(s): "LIPASE", "AMYLASE" in the last 168 hours. No results for input(s): "AMMONIA" in the last 168 hours.  CBC: Recent Labs  Lab 03/12/23 1916 03/14/23 0531 03/15/23 0805  WBC 7.1 6.4 7.3  NEUTROABS 4.9  --   --   HGB 9.5* 9.4* 8.8*  HCT 32.4* 31.6* 29.4*  MCV 88.8 89.3 88.3  PLT 271 268 251     HbA1C: Hgb A1c MFr Bld  Date/Time Value Ref Range Status  03/13/2023 05:00 AM 8.5 (H) 4.8 - 5.6 % Final    Comment:    (NOTE) Pre diabetes:          5.7%-6.4%  Diabetes:              >6.4%  Glycemic control for   <7.0% adults with diabetes   03/30/2022 09:36 AM 7.8 (H) 4.8 - 5.6 % Final  Comment:    (NOTE) Pre diabetes:          5.7%-6.4%  Diabetes:              >6.4%  Glycemic control for   <7.0% adults with diabetes     Urinalysis: No results for input(s): "COLORURINE", "LABSPEC", "PHURINE", "GLUCOSEU", "HGBUR", "BILIRUBINUR", "KETONESUR", "PROTEINUR", "UROBILINOGEN", "NITRITE", "LEUKOCYTESUR" in the last 72 hours.  Invalid input(s): "APPERANCEUR"    Imaging: [place] UE VEIN MAPPING LEFT (PRE-OP AVF)  Result Date: 03/16/2023 CLINICAL DATA:  Preoperative vascular survey prior to arteriovenous fistula creation, end-stage renal disease EXAM: [place] EXTREM UP VEIN MAPPING COMPARISON:  None Available. FINDINGS: LEFT ARTERIES Wrist Radial Artery: Size 2.29mm Waveform Triphasic Wrist Ulnar Artery: Size 1.36mm Waveform Triphasic Prox. Forearm Radial Artery: Size 3.25mm Waveform Triphasic Upper Arm Brachial Artery: Size 5.44mm Waveform Triphasic LEFT VEINS Forearm Cephalic Vein: Prox 6mm Distal 2mm Depth 20mm Upper Arm Cephalic Vein: Prox 4mm Distal 4mm Depth 5mm Upper Arm Basilic Vein: Prox 3mm Distal 3mm Depth 6mm Upper Arm Brachial Vein: Prox 10mm Distal 5mm Depth 5mm ADDITIONAL LEFT VEINS Axillary Vein:  19mm Subclavian Vein: Patient: Yes Respiratory Phasicity:  Present Internal Jugular Vein: Patent: Yes    Respiratory Phasicity: Present Branches > 2 mm: At least 4 branches are present measuring 2 mm or larger. All 4 branches arise from the cephalic vein. IMPRESSION: 1. Relatively small radial artery measuring only 2 mm in diameter at the wrist. 2. Widely patent left veins without evidence of central venous obstruction. 3. At least 4 branches measuring larger than 2 mm in diameter arise from the cephalic vein. Electronically Signed   By: Malachy Moan M.D.   On: 03/16/2023 07:02     Medications:     amLODipine  10 mg Oral Daily   aspirin EC  81 mg Oral Daily   atorvastatin  40 mg Oral q morning   calcitRIOL  0.25 mcg Oral Daily   calcium acetate  1,334 mg Oral TID WC   Chlorhexidine Gluconate Cloth  6 each Topical Q0600   feeding supplement (NEPRO CARB STEADY)  237 mL Oral TID BM   heparin  5,000 Units Subcutaneous Q8H   insulin aspart  0-9 Units Subcutaneous Q4H   metoprolol tartrate  50 mg Oral BID   multivitamin  1 tablet Oral QHS   QUEtiapine  25 mg Oral BID   torsemide  100 mg Oral Daily    Assessment/ Plan:     67 year old male with history of hypertension, coronary artery disease, congestive heart failure, diabetes, peripheral vascular disease and now end-stage renal disease started on dialysis treatment. Patient had 2 dialysis treatments so far.  Is scheduled for the third treatment today.  #1: End-stage renal disease: Patient is a new start for dialysis.  He had 2 dialysis treatments so far.  He is scheduled for dialysis again today.  Patient was scheduled for permacath placement yesterday but was canceled.  #2: Anemia: Will continue anemia protocol with IV iron.  #3: Secondary hyperparathyroidism: Will continue calcium acetate as binders along with calcitriol.  #4: Hypertension/CHF: Patient is being evaluated by cardiology.  Patient has been on amlodipine and metoprolol with torsemide.  Will continue same for now.  #5:  Diabetes: Continue insulin as ordered.  #6: Agitation: Patient may benefit from initiation of quetiapine at a lower dose.  Labs and medications reviewed. Will continue to follow along with you.   LOS: 5 Lorain Childes, MD [facility] 10/19/20241:52 PM

## 2023-03-17 NOTE — Plan of Care (Signed)
  Problem: Education: Goal: Ability to demonstrate management of disease process will improve Outcome: Progressing Goal: Ability to verbalize understanding of medication therapies will improve Outcome: Progressing Goal: Individualized Educational Video(s) Outcome: Progressing   Problem: Activity: Goal: Capacity to carry out activities will improve Outcome: Progressing   Problem: Cardiac: Goal: Ability to achieve and maintain adequate cardiopulmonary perfusion will improve Outcome: Progressing   Problem: Education: Goal: Ability to describe self-care measures that may prevent or decrease complications (Diabetes Survival Skills Education) will improve Outcome: Progressing Goal: Individualized Educational Video(s) Outcome: Progressing   Problem: Coping: Goal: Ability to adjust to condition or change in health will improve Outcome: Progressing   Problem: Fluid Volume: Goal: Ability to maintain a balanced intake and output will improve Outcome: Progressing   Problem: Health Behavior/Discharge Planning: Goal: Ability to identify and utilize available resources and services will improve Outcome: Progressing Goal: Ability to manage health-related needs will improve Outcome: Progressing   Problem: Metabolic: Goal: Ability to maintain appropriate glucose levels will improve Outcome: Progressing   Problem: Nutritional: Goal: Maintenance of adequate nutrition will improve Outcome: Progressing Goal: Progress toward achieving an optimal weight will improve Outcome: Progressing   Problem: Skin Integrity: Goal: Risk for impaired skin integrity will decrease Outcome: Progressing   Problem: Tissue Perfusion: Goal: Adequacy of tissue perfusion will improve Outcome: Progressing   Problem: Education: Goal: Knowledge of General Education information will improve Description: Including pain rating scale, medication(s)/side effects and non-pharmacologic comfort measures Outcome:  Progressing   Problem: Health Behavior/Discharge Planning: Goal: Ability to manage health-related needs will improve Outcome: Progressing   Problem: Clinical Measurements: Goal: Ability to maintain clinical measurements within normal limits will improve Outcome: Progressing Goal: Will remain free from infection Outcome: Progressing Goal: Diagnostic test results will improve Outcome: Progressing Goal: Respiratory complications will improve Outcome: Progressing Goal: Cardiovascular complication will be avoided Outcome: Progressing   Problem: Activity: Goal: Risk for activity intolerance will decrease Outcome: Progressing   Problem: Nutrition: Goal: Adequate nutrition will be maintained Outcome: Progressing   Problem: Coping: Goal: Level of anxiety will decrease Outcome: Progressing   Problem: Elimination: Goal: Will not experience complications related to bowel motility Outcome: Progressing Goal: Will not experience complications related to urinary retention Outcome: Progressing   Problem: Pain Managment: Goal: General experience of comfort will improve Outcome: Progressing   Problem: Safety: Goal: Ability to remain free from injury will improve Outcome: Progressing   Problem: Skin Integrity: Goal: Risk for impaired skin integrity will decrease Outcome: Progressing   Problem: Education: Goal: Knowledge of disease and its progression will improve Outcome: Progressing   Problem: Fluid Volume: Goal: Compliance with measures to maintain balanced fluid volume will improve Outcome: Progressing   Problem: Health Behavior/Discharge Planning: Goal: Ability to manage health-related needs will improve Outcome: Progressing   Problem: Nutritional: Goal: Ability to make healthy dietary choices will improve Outcome: Progressing   Problem: Clinical Measurements: Goal: Complications related to the disease process, condition or treatment will be avoided or  minimized Outcome: Progressing

## 2023-03-18 DIAGNOSIS — I10 Essential (primary) hypertension: Secondary | ICD-10-CM | POA: Diagnosis not present

## 2023-03-18 DIAGNOSIS — I4729 Other ventricular tachycardia: Secondary | ICD-10-CM | POA: Diagnosis not present

## 2023-03-18 DIAGNOSIS — I5033 Acute on chronic diastolic (congestive) heart failure: Secondary | ICD-10-CM | POA: Diagnosis not present

## 2023-03-18 DIAGNOSIS — I340 Nonrheumatic mitral (valve) insufficiency: Secondary | ICD-10-CM | POA: Diagnosis not present

## 2023-03-18 LAB — GLUCOSE, CAPILLARY
Glucose-Capillary: 141 mg/dL — ABNORMAL HIGH (ref 70–99)
Glucose-Capillary: 141 mg/dL — ABNORMAL HIGH (ref 70–99)
Glucose-Capillary: 153 mg/dL — ABNORMAL HIGH (ref 70–99)
Glucose-Capillary: 165 mg/dL — ABNORMAL HIGH (ref 70–99)
Glucose-Capillary: 178 mg/dL — ABNORMAL HIGH (ref 70–99)
Glucose-Capillary: 87 mg/dL (ref 70–99)

## 2023-03-18 MED ORDER — MELATONIN 5 MG PO TABS
5.0000 mg | ORAL_TABLET | Freq: Every day | ORAL | Status: DC
  Administered 2023-03-18 – 2023-03-19 (×2): 5 mg via ORAL
  Filled 2023-03-18 (×2): qty 1

## 2023-03-18 NOTE — Progress Notes (Signed)
Progress Note   Patient: Victor Hernandez:[id number] DOB: 06-19-1955 DOA: 03/12/2023     6 DOS: the patient was seen and examined on 03/18/2023   Brief hospital course:  Victor Hernandez is a 67 y.o. male with medical history significant for hypertension, insulin-dependent type 2 diabetes mellitus, HFpEF, severe mitral regurg on echo 2022, CKD stage IV-V.   Presented to ED from PCP for abnormal blood work, specifically worsening renal function.  Patient had been reporting increasing lower extremity edema dyspnea on exertion and orthopnea.    ED Course: BP 194/119 on arrival with pulse in the 90s and low 100s and tachypneic at times to 33 with O2 sat intermittently dipping to as low as 86% on room air. Labs: BNP 2518,WBC, hgb 9.5 which appears to be his baseline CMP: Cr 7.28 with normal bicarb of 23 and potassium 4.1, GFR 8 EKG not done Chest x-ray: pulmonary vascular congestion Patient was treated with IV labetalol oral hydralazine and metoprolol and also given a dose of IV Lasix.    Nephrology was consulted    Patient was admitted to medicine service for further workup and management of hypertensive emergency with pulmonary edema and acute renal failure.   Further hospital course and management as outlined in detail below.   10/15 -- R femoral temp dialysis line placed by Vascular surgery 10/16 -- pt tolerated initial dialysis session this AM 10/17 -- dialysis 10/18 -- perm cath placement cancelled.  Short runs of NSVT (9 and 12 beats), pt asymptomatic.     Assessment and Plan:  Hypertensive emergency- 190 systolics on admission - continue home amlodipine and hydralazine.  - titrate regimen for adequate BP control - monitor for fluctuation with initiation of HD   Acute respiratory failure with hypoxia, POA- resolved. secondary to vascular congestion  / fluid overload (multifactorial with severe range HTN and worsening renal function).  No oxygen use at baseline. originally on  2L O2, now on room air.  Denies respiratory distress, SOB with ambulating to the bathroom - continue IV lasix. Endorses good UOP - echo on 10/15 showed EF 45-50%, grade III diastolic dysfunction, mod-severe MR, mild-mod TR, small pericardial effusion - volume management now via hemodialysis   ESRD needing dialysis (HCC)- Cr greatly worsened and having hypervolemia with end-organ failure. Endorses 3 voids today which was not recorded. Endorses improvement in his LE edema since admission - continue lasix Nephrology consulted and recommended initiation of HD.  Vascular consulted- R femoralTDC placed 10/15 Dialysis initiated 10/16 Vascular surgery placing PermCath today - strict I/O - BMP am  PM Addendum Non-sustained V-tach - 10/18, two runs reported, 9 and 12 beats.  Pt was asymptomatic.  K and Mg are at goal. Likely due to renal failure. --Telemetry --Maintain K>4, Mg>2 --Cardiology consulted --Metoprolol increased     Acute on chronic diastolic CHF  (HCC)- echo read pending - continue home metoprolol   Diabetes mellitus, type II (HCC) Sliding scale insulin coverage   Anemia of chronic kidney failure, stage 5 (HCC) Appears at baseline - CBC am        Subjective: Pt seen this AM sitting edge of bed.  Per RN, he refused his AM meds, vitals and to get dialysis today.  Pt convinced people here trying to harm him.  He does state poor understanding of everything going on with him.  Spent time explaining and he said he felt better, but still relunctant to agree to getting perm cath.  He reports something was smoking  in the room, felt the staff in procedure room were behaving odd and suspicious.  Has paranoid thinking about people conspiring against him here.   Ultimately agree to take meds and get dialysis   Physical Exam: Vitals:   03/17/23 2039 03/18/23 0424 03/18/23 1730 03/18/23 1926  BP: (!) 143/89 126/87 122/71 (!) 146/91  Pulse: 67 64 69 62  Resp:   16 18  Temp: 98.3 F  (36.8 C) 98.3 F (36.8 C) 98 F (36.7 C) 98.3 F (36.8 C)  TempSrc: Oral Oral Oral Oral  SpO2: 97% 98% 100% 100%  Weight:  91.8 kg    Height:       General exam: awake, no acute distress, mildly conffused / paranoid HEENT: moist mucus membranes, hearing grossly normal  Respiratory system: om room air, normal respiratory effort. Cardiovascular system: RRR, trace LE edema.   Gastrointestinal system: soft, NT, ND Central nervous system: no gross focal neurologic deficits, limited exam Extremities: R fem TDC present,  moves all equally Psychiatry: normal mood, flat affect, abnormal judgment and insight with paranoid thinking, no apparent VH/AH   Data Reviewed:  Notable labs ---  glucose 120, BUN 85, Cr 6.07, Ca 8.6, phos 6.0, albumin 2.5 Hbg 8.2  Family Communication: at bedside on rounds 10/16, updated  Disposition: Status is: Inpatient Remains inpatient appropriate because: initiated on hemodialysis, will remain admitted pending outpatient dialysis chair time.   Planned Discharge Destination: Home    Time spent: 35 minutes  Author: Pennie Banter, DO 03/18/2023 7:43 PM  For on call review [email or web address].

## 2023-03-18 NOTE — Progress Notes (Signed)
[facility]  PROGRESS NOTE   Subjective:   Out of bed to chair and feels much better today. Ate well today.  Objective:  Vital signs: Blood pressure 126/87, pulse 64, temperature 98.3 F (36.8 C), temperature source Oral, resp. rate (!) 25, height 5\' 11"  (1.803 m), weight 91.8 kg, SpO2 98%.  Intake/Output Summary (Last 24 hours) at 03/18/2023 1140 Last data filed at 03/17/2023 1837 Gross per 24 hour  Intake --  Output 2000 ml  Net -2000 ml   Filed Weights   03/17/23 0500 03/17/23 1842 03/18/23 0424  Weight: 87.5 kg 92.1 kg 91.8 kg     Physical Exam: General:  No acute distress  Head:  Normocephalic, atraumatic. Moist oral mucosal membranes  Eyes:  Anicteric  Neck:  Supple  Lungs:   Clear to auscultation, normal effort  Heart:  S1S2 no rubs  Abdomen:   Soft, nontender, bowel sounds present  Extremities:  peripheral edema.  Neurologic:  Awake, alert, following commands  Skin:  No lesions  Access:     Basic Metabolic Panel: Recent Labs  Lab 03/12/23 2151 03/13/23 0500 03/14/23 0531 03/15/23 0526 03/16/23 0627 03/17/23 0500  NA  --  143 145 144 141 144  K  --  3.9 4.3 4.5 4.3 4.4  CL  --  108 109 109 106 107  CO2  --  23 24 25 26 26   GLUCOSE  --  137* 113* 135* 201* 120*  BUN  --  97* 98* 81* 75* 85*  CREATININE  --  7.15* 7.53* 6.28* 5.72* 6.07*  CALCIUM  --  8.5* 8.0* 8.0* 8.4* 8.6*  MG 2.2  --   --   --  2.0  --   PHOS  --   --  7.3* 6.8* 5.3* 6.0*   GFR: Estimated Creatinine Clearance: 13.3 mL/min (A) (by C-G formula based on SCr of 6.07 mg/dL (H)).  Liver Function Tests: Recent Labs  Lab 03/12/23 1916 03/14/23 0531 03/15/23 0526 03/16/23 0627 03/17/23 0500  AST 17  --   --   --   --   ALT 23  --   --   --   --   ALKPHOS 117  --   --   --   --   BILITOT 0.8  --   --   --   --   PROT 6.0*  --   --   --   --   ALBUMIN 2.7* 2.5* 2.4* 2.5* 2.5*   No results for input(s): "LIPASE", "AMYLASE" in the last 168 hours. No results for  input(s): "AMMONIA" in the last 168 hours.  CBC: Recent Labs  Lab 03/12/23 1916 03/14/23 0531 03/15/23 0805 03/17/23 1501  WBC 7.1 6.4 7.3 5.9  NEUTROABS 4.9  --   --   --   HGB 9.5* 9.4* 8.8* 8.2*  HCT 32.4* 31.6* 29.4* 27.4*  MCV 88.8 89.3 88.3 87.8  PLT 271 268 251 211     HbA1C: Hgb A1c MFr Bld  Date/Time Value Ref Range Status  03/13/2023 05:00 AM 8.5 (H) 4.8 - 5.6 % Final    Comment:    (NOTE) Pre diabetes:          5.7%-6.4%  Diabetes:              >6.4%  Glycemic control for   <7.0% adults with diabetes   03/30/2022 09:36 AM 7.8 (H) 4.8 - 5.6 % Final    Comment:    (NOTE) Pre  diabetes:          5.7%-6.4%  Diabetes:              >6.4%  Glycemic control for   <7.0% adults with diabetes     Urinalysis: No results for input(s): "COLORURINE", "LABSPEC", "PHURINE", "GLUCOSEU", "HGBUR", "BILIRUBINUR", "KETONESUR", "PROTEINUR", "UROBILINOGEN", "NITRITE", "LEUKOCYTESUR" in the last 72 hours.  Invalid input(s): "APPERANCEUR"    Imaging: No results found.   Medications:     amLODipine  10 mg Oral Daily   aspirin EC  81 mg Oral Daily   atorvastatin  40 mg Oral q morning   calcitRIOL  0.25 mcg Oral Daily   calcium acetate  1,334 mg Oral TID WC   Chlorhexidine Gluconate Cloth  6 each Topical Q0600   feeding supplement (NEPRO CARB STEADY)  237 mL Oral TID BM   heparin  5,000 Units Subcutaneous Q8H   insulin aspart  0-9 Units Subcutaneous Q4H   melatonin  5 mg Oral QHS   metoprolol tartrate  50 mg Oral BID   multivitamin  1 tablet Oral QHS   QUEtiapine  25 mg Oral BID   torsemide  100 mg Oral Daily    Assessment/ Plan:     67 year old male with history of hypertension, coronary artery disease, congestive heart failure, diabetes, peripheral vascular disease and now end-stage renal disease started on dialysis treatment. Patient had 3 dialysis treatments so far.    #1: End-stage renal disease: Patient is a new start for dialysis.  He had 3 dialysis  treatments so far.  He had stable dialysis last night with 2 L fluid removal.  Patient was scheduled for permacath placement Friday but was canceled.   #2: Anemia: Will continue anemia protocol with IV iron.   #3: Secondary hyperparathyroidism: Will continue calcium acetate as binders along with calcitriol.   #4: Hypertension/CHF: Patient is being evaluated by cardiology.  Patient has been on amlodipine and metoprolol with torsemide.  Will continue same for now.   #5: Diabetes: Continue insulin as ordered.   #6: Agitation: Feels much better with quetiapine.  Labs and medications reviewed. Will continue to follow along with you.   LOS: 6 Lorain Childes, MD [facility] 10/20/202411:40 AM

## 2023-03-18 NOTE — Progress Notes (Signed)
Patients wife came to the hospital. The Front desk called and asked about the wife being here. Went in and talked to patient and he said it was okay for his wife to come see him. Did explain that if she started anything that we would call security and she would be asked to leave.

## 2023-03-18 NOTE — Progress Notes (Signed)
Rounding Note    Patient Name: Victor Hernandez Date of Encounter: 03/18/2023  [facility] Cardiologist: Yvonne Kendall, MD   Subjective   No acute events overnight, underwent dialysis yesterday successfully.  Denies any palpitations.  Inpatient Medications    Scheduled Meds:  amLODipine  10 mg Oral Daily   aspirin EC  81 mg Oral Daily   atorvastatin  40 mg Oral q morning   calcitRIOL  0.25 mcg Oral Daily   calcium acetate  1,334 mg Oral TID WC   Chlorhexidine Gluconate Cloth  6 each Topical Q0600   feeding supplement (NEPRO CARB STEADY)  237 mL Oral TID BM   heparin  5,000 Units Subcutaneous Q8H   insulin aspart  0-9 Units Subcutaneous Q4H   melatonin  5 mg Oral QHS   metoprolol tartrate  50 mg Oral BID   multivitamin  1 tablet Oral QHS   QUEtiapine  25 mg Oral BID   torsemide  100 mg Oral Daily   Continuous Infusions:  PRN Meds: acetaminophen **OR** acetaminophen, hydrALAZINE, ondansetron **OR** ondansetron (ZOFRAN) IV, mouth rinse   Vital Signs    Vitals:   03/17/23 1837 03/17/23 1842 03/17/23 2039 03/18/23 0424  BP: (!) 156/133 (!) 150/95 (!) 143/89 126/87  Pulse: 88 91 67 64  Resp: (!) 23 (!) 25    Temp: 97.6 F (36.4 C)  98.3 F (36.8 C) 98.3 F (36.8 C)  TempSrc: Oral  Oral Oral  SpO2: 94% 95% 97% 98%  Weight:  92.1 kg  91.8 kg  Height:        Intake/Output Summary (Last 24 hours) at 03/18/2023 1317 Last data filed at 03/17/2023 1837 Gross per 24 hour  Intake --  Output 2000 ml  Net -2000 ml      03/18/2023    4:24 AM 03/17/2023    6:42 PM 03/17/2023    2:43 PM  Last 3 Weights  Weight (lbs) 202 lb 6.1 oz 203 lb 0.7 oz --  Weight (kg) 91.8 kg 92.1 kg --      Telemetry    Currently off telemetry- Personally Reviewed  ECG    Currently off telemetry- Personally Reviewed  Physical Exam   GEN: No acute distress.   Neck: No JVD Cardiac: RRR, 2/6 systolic murmur Respiratory: Clear to auscultation bilaterally. GI: Soft,  nontender, non-distended  MS: 1+ edema; No deformity. Neuro:  Nonfocal  Psych: Normal affect   Labs    High Sensitivity Troponin:   Recent Labs  Lab 02/20/23 1405  TROPONINIHS 4     Chemistry Recent Labs  Lab 03/12/23 1916 03/12/23 2151 03/13/23 0500 03/15/23 0526 03/16/23 0627 03/17/23 0500  NA 143  --    < > 144 141 144  K 4.1  --    < > 4.5 4.3 4.4  CL 107  --    < > 109 106 107  CO2 23  --    < > 25 26 26   GLUCOSE 166*  --    < > 135* 201* 120*  BUN 98*  --    < > 81* 75* 85*  CREATININE 7.27*  --    < > 6.28* 5.72* 6.07*  CALCIUM 8.4*  --    < > 8.0* 8.4* 8.6*  MG  --  2.2  --   --  2.0  --   PROT 6.0*  --   --   --   --   --   ALBUMIN 2.7*  --    < >  2.4* 2.5* 2.5*  AST 17  --   --   --   --   --   ALT 23  --   --   --   --   --   ALKPHOS 117  --   --   --   --   --   BILITOT 0.8  --   --   --   --   --   GFRNONAA 8*  --    < > 9* 10* 9*  ANIONGAP 13  --    < > 10 9 11    < > = values in this interval not displayed.    Lipids No results for input(s): "CHOL", "TRIG", "HDL", "LABVLDL", "LDLCALC", "CHOLHDL" in the last 168 hours.  Hematology Recent Labs  Lab 03/14/23 0531 03/15/23 0805 03/17/23 1501  WBC 6.4 7.3 5.9  RBC 3.54* 3.33* 3.12*  HGB 9.4* 8.8* 8.2*  HCT 31.6* 29.4* 27.4*  MCV 89.3 88.3 87.8  MCH 26.6 26.4 26.3  MCHC 29.7* 29.9* 29.9*  RDW 17.1* 17.3* 17.2*  PLT 268 251 211   Thyroid No results for input(s): "TSH", "FREET4" in the last 168 hours.  BNP Recent Labs  Lab 03/12/23 2151  BNP 2,517.8*    DDimer No results for input(s): "DDIMER" in the last 168 hours.   Radiology    No results found.  Cardiac Studies   TTE 03/13/2023  1. Left ventricular ejection fraction, by estimation, is 45 to 50%. The  left ventricle has mildly decreased function. The left ventricle  demonstrates global hypokinesis. The left ventricular internal cavity size  was mildly dilated. There is moderate  left ventricular hypertrophy. Left ventricular  diastolic parameters are  consistent with Grade III diastolic dysfunction (restrictive). Elevated  left atrial pressure. The average left ventricular global longitudinal  strain is -14.4 %. The global  longitudinal strain is abnormal.   2. Right ventricular systolic function is normal. The right ventricular  size is normal. Mildly increased right ventricular wall thickness. There  is severely elevated pulmonary artery systolic pressure. The estimated  right ventricular systolic pressure  is 74.3 mmHg.   3. Left atrial size was severely dilated.   Patient Profile     67 y.o. male with history of HFpEF, hypertension, diabetes, end-stage renal disease on HD presenting for worsening renal function, being seen for NSVT.  Assessment & Plan    NSVT -2 episodes yesterday -No palpitations, currently off telemetry, metoprolol increased to 50 mg twice daily. -Continue Lopressor to 50 mg twice daily.   2.  HFpEF -EF 45 to 50% -Volume control with dialysis -Torsemide 100 mg daily   3.  Moderate to severe MR -Outpatient workup, MitraClip consideration   4.  Hypertension -BP controlled. -Volume control with HD -Continue Lopressor  50 mg twice daily. Norvasc 10 mg daily.  No additional cardiac testing planned .  Continue medications as prescribed.  Let us know if additional cardiac input is needed.    Signed, Debbe Odea, MD  03/18/2023, 1:17 PM

## 2023-03-19 ENCOUNTER — Encounter: Payer: Self-pay | Admitting: Vascular Surgery

## 2023-03-19 ENCOUNTER — Encounter
Admission: EM | Disposition: A | Discharge status: Home / Self Care | Payer: Self-pay | Source: Home / Self Care | Attending: Internal Medicine

## 2023-03-19 DIAGNOSIS — I5033 Acute on chronic diastolic (congestive) heart failure: Secondary | ICD-10-CM | POA: Diagnosis not present

## 2023-03-19 DIAGNOSIS — Z992 Dependence on renal dialysis: Secondary | ICD-10-CM | POA: Diagnosis not present

## 2023-03-19 DIAGNOSIS — N186 End stage renal disease: Secondary | ICD-10-CM | POA: Diagnosis not present

## 2023-03-19 HISTORY — PX: DIALYSIS/PERMA CATHETER INSERTION: CATH118288

## 2023-03-19 LAB — CBC
HCT: 28.7 % — ABNORMAL LOW (ref 39.0–52.0)
Hemoglobin: 8.5 g/dL — ABNORMAL LOW (ref 13.0–17.0)
MCH: 26.1 pg (ref 26.0–34.0)
MCHC: 29.6 g/dL — ABNORMAL LOW (ref 30.0–36.0)
MCV: 88 fL (ref 80.0–100.0)
Platelets: 231 10*3/uL (ref 150–400)
RBC: 3.26 MIL/uL — ABNORMAL LOW (ref 4.22–5.81)
RDW: 16.6 % — ABNORMAL HIGH (ref 11.5–15.5)
WBC: 6.3 10*3/uL (ref 4.0–10.5)
nRBC: 0 % (ref 0.0–0.2)

## 2023-03-19 LAB — RENAL FUNCTION PANEL
Albumin: 2.6 g/dL — ABNORMAL LOW (ref 3.5–5.0)
Anion gap: 10 (ref 5–15)
BUN: 65 mg/dL — ABNORMAL HIGH (ref 8–23)
CO2: 28 mmol/L (ref 22–32)
Calcium: 8.7 mg/dL — ABNORMAL LOW (ref 8.9–10.3)
Chloride: 102 mmol/L (ref 98–111)
Creatinine, Ser: 5.39 mg/dL — ABNORMAL HIGH (ref 0.61–1.24)
GFR, Estimated: 11 mL/min — ABNORMAL LOW (ref >60)
Glucose, Bld: 113 mg/dL — ABNORMAL HIGH (ref 70–99)
Phosphorus: 5.7 mg/dL — ABNORMAL HIGH (ref 2.5–4.6)
Potassium: 4.5 mmol/L (ref 3.5–5.1)
Sodium: 140 mmol/L (ref 135–145)

## 2023-03-19 LAB — GLUCOSE, CAPILLARY
Glucose-Capillary: 102 mg/dL — ABNORMAL HIGH (ref 70–99)
Glucose-Capillary: 100 mg/dL — ABNORMAL HIGH (ref 70–99)
Glucose-Capillary: 96 mg/dL (ref 70–99)
Glucose-Capillary: 96 mg/dL (ref 70–99)
Glucose-Capillary: 199 mg/dL — ABNORMAL HIGH (ref 70–99)
Glucose-Capillary: 268 mg/dL — ABNORMAL HIGH (ref 70–99)
Glucose-Capillary: 180 mg/dL — ABNORMAL HIGH (ref 70–99)

## 2023-03-19 LAB — AMMONIA: Ammonia: 13 umol/L (ref 9–35)

## 2023-03-19 SURGERY — DIALYSIS/PERMA CATHETER INSERTION
Anesthesia: Moderate Sedation | Laterality: Right

## 2023-03-19 MED ORDER — MIDAZOLAM HCL 2 MG/2ML IJ SOLN
INTRAMUSCULAR | Status: AC
  Filled 2023-03-19: qty 2

## 2023-03-19 MED ORDER — MIDAZOLAM HCL 2 MG/2ML IJ SOLN
INTRAMUSCULAR | Status: DC | PRN
  Administered 2023-03-19: 2 mg via INTRAVENOUS

## 2023-03-19 MED ORDER — FENTANYL CITRATE PF 50 MCG/ML IJ SOSY
PREFILLED_SYRINGE | INTRAMUSCULAR | Status: AC
  Filled 2023-03-19: qty 1

## 2023-03-19 MED ORDER — LIDOCAINE-EPINEPHRINE (PF) 1 %-1:200000 IJ SOLN
INTRAMUSCULAR | Status: DC | PRN
  Administered 2023-03-19: 10 mL via INTRADERMAL

## 2023-03-19 MED ORDER — HEPARIN SODIUM (PORCINE) 10000 UNIT/ML IJ SOLN
INTRAMUSCULAR | Status: DC | PRN
  Administered 2023-03-19: 10000 [IU]

## 2023-03-19 MED ORDER — FENTANYL CITRATE (PF) 100 MCG/2ML IJ SOLN
INTRAMUSCULAR | Status: DC | PRN
  Administered 2023-03-19: 50 ug via INTRAVENOUS

## 2023-03-19 MED ORDER — SODIUM CHLORIDE 0.9 % IV SOLN: INTRAVENOUS | Status: AC

## 2023-03-19 MED ORDER — CEFAZOLIN SODIUM-DEXTROSE 1-4 GM/50ML-% IV SOLN
1.0000 g | INTRAVENOUS | Status: AC
  Administered 2023-03-19: 1 g via INTRAVENOUS

## 2023-03-19 MED ORDER — HEPARIN (PORCINE) IN NACL 1000-0.9 UT/500ML-% IV SOLN
INTRAVENOUS | Status: DC | PRN
  Administered 2023-03-19: 500 mL

## 2023-03-19 MED ORDER — CEFAZOLIN SODIUM-DEXTROSE 1-4 GM/50ML-% IV SOLN
INTRAVENOUS | Status: AC
  Filled 2023-03-19: qty 50

## 2023-03-19 SURGICAL SUPPLY — 6 items
ADH SKN CLS APL DERMABOND .7 (GAUZE/BANDAGES/DRESSINGS) ×1
CATH CANNON HEMO 15FR 19 (HEMODIALYSIS SUPPLIES) IMPLANT
COVER PROBE ULTRASOUND 5X96 (MISCELLANEOUS) IMPLANT
DERMABOND ADVANCED .7 DNX12 (GAUZE/BANDAGES/DRESSINGS) IMPLANT
SUT MNCRL AB 4-0 PS2 18 (SUTURE) IMPLANT
SUT PROLENE 0 CT 1 30 (SUTURE) IMPLANT

## 2023-03-19 NOTE — Interval H&P Note (Signed)
History and Physical Interval Note:  03/19/2023 9:41 AM  Victor Hernandez  has presented today for surgery, with the diagnosis of ESRD.  The various methods of treatment have been discussed with the patient and family. After consideration of risks, benefits and other options for treatment, the patient has consented to  Procedure(s): DIALYSIS/PERMA CATHETER INSERTION (Right) as a surgical intervention.  The patient's history has been reviewed, patient examined, no change in status, stable for surgery.  I have reviewed the patient's chart and labs.  Questions were answered to the patient's satisfaction.     Festus Barren

## 2023-03-19 NOTE — Op Note (Signed)
OPERATIVE NOTE    PRE-OPERATIVE DIAGNOSIS: 1. ESRD   POST-OPERATIVE DIAGNOSIS: same as above  PROCEDURE: Ultrasound guidance for vascular access to the right internal jugular vein Fluoroscopic guidance for placement of catheter Placement of a 19 cm tip to cuff tunneled hemodialysis catheter via the right internal jugular vein  SURGEON: Festus Barren, MD  ANESTHESIA:  Local with Moderate conscious sedation for approximately 16 minutes using 2 mg of Versed and 50 mcg of Fentanyl  ESTIMATED BLOOD LOSS: 3 cc  FLUORO TIME: less than one minute  CONTRAST: none  FINDING(S): 1.  Patent right internal jugular vein  SPECIMEN(S):  None  INDICATIONS:   Victor Hernandez is a 67 y.o.male who presents with renal failure.  The patient needs long term dialysis access for their ESRD, and a Permcath is necessary.  Risks and benefits are discussed and informed consent is obtained.    DESCRIPTION: After obtaining full informed written consent, the patient was brought back to the vascular suited. The patient's right neck and chest were sterilely prepped and draped in a sterile surgical field was created. Moderate conscious sedation was administered during a face to face encounter with the patient throughout the procedure with my supervision of the RN administering medicines and monitoring the patient's vital signs, pulse oximetry, telemetry and mental status throughout from the start of the procedure until the patient was taken to the recovery room.  The right internal jugular vein was visualized with ultrasound and found to be patent. It was then accessed under direct ultrasound guidance and a permanent image was recorded. A wire was placed. After skin nick and dilatation, the peel-away sheath was placed over the wire. I then turned my attention to an area under the clavicle. Approximately 1-2 fingerbreadths below the clavicle a small counterincision was created and tunneled from the subclavicular incision to  the access site. Using fluoroscopic guidance, a 19 centimeter tip to cuff tunneled hemodialysis catheter was selected, and tunneled from the subclavicular incision to the access site. It was then placed through the peel-away sheath and the peel-away sheath was removed. Using fluoroscopic guidance the catheter tips were parked in the right atrium. The appropriate distal connectors were placed. It withdrew blood well and flushed easily with heparinized saline and a concentrated heparin solution was then placed. It was secured to the chest wall with 2 Prolene sutures. The access incision was closed single 4-0 Monocryl. A 4-0 Monocryl pursestring suture was placed around the exit site. Sterile dressings were placed. The patient tolerated the procedure well and was taken to the recovery room in stable condition.  COMPLICATIONS: None  CONDITION: Stable  Festus Barren, MD 03/19/2023 10:12 AM   This note was created with Dragon Medical transcription system. Any errors in dictation are purely unintentional.

## 2023-03-19 NOTE — Plan of Care (Signed)
  Problem: Education: Goal: Ability to demonstrate management of disease process will improve Outcome: Not Progressing Goal: Ability to verbalize understanding of medication therapies will improve Outcome: Not Progressing Goal: Individualized Educational Video(s) Outcome: Not Progressing   Problem: Activity: Goal: Capacity to carry out activities will improve Outcome: Not Progressing   Problem: Cardiac: Goal: Ability to achieve and maintain adequate cardiopulmonary perfusion will improve Outcome: Not Progressing   Problem: Education: Goal: Ability to describe self-care measures that may prevent or decrease complications (Diabetes Survival Skills Education) will improve Outcome: Not Progressing Goal: Individualized Educational Video(s) Outcome: Not Progressing   Problem: Coping: Goal: Ability to adjust to condition or change in health will improve Outcome: Not Progressing   Problem: Fluid Volume: Goal: Ability to maintain a balanced intake and output will improve Outcome: Not Progressing   Problem: Health Behavior/Discharge Planning: Goal: Ability to identify and utilize available resources and services will improve Outcome: Not Progressing Goal: Ability to manage health-related needs will improve Outcome: Not Progressing   Problem: Metabolic: Goal: Ability to maintain appropriate glucose levels will improve Outcome: Not Progressing   Problem: Nutritional: Goal: Maintenance of adequate nutrition will improve Outcome: Not Progressing Goal: Progress toward achieving an optimal weight will improve Outcome: Not Progressing   Problem: Skin Integrity: Goal: Risk for impaired skin integrity will decrease Outcome: Not Progressing   Problem: Tissue Perfusion: Goal: Adequacy of tissue perfusion will improve Outcome: Not Progressing   Problem: Education: Goal: Knowledge of General Education information will improve Description: Including pain rating scale,  medication(s)/side effects and non-pharmacologic comfort measures Outcome: Not Progressing   Problem: Health Behavior/Discharge Planning: Goal: Ability to manage health-related needs will improve Outcome: Not Progressing   Problem: Clinical Measurements: Goal: Ability to maintain clinical measurements within normal limits will improve Outcome: Not Progressing Goal: Will remain free from infection Outcome: Not Progressing Goal: Diagnostic test results will improve Outcome: Not Progressing Goal: Respiratory complications will improve Outcome: Not Progressing Goal: Cardiovascular complication will be avoided Outcome: Not Progressing   Problem: Activity: Goal: Risk for activity intolerance will decrease Outcome: Not Progressing   Problem: Nutrition: Goal: Adequate nutrition will be maintained Outcome: Not Progressing   Problem: Coping: Goal: Level of anxiety will decrease Outcome: Not Progressing   Problem: Elimination: Goal: Will not experience complications related to bowel motility Outcome: Not Progressing Goal: Will not experience complications related to urinary retention Outcome: Not Progressing   Problem: Pain Managment: Goal: General experience of comfort will improve Outcome: Not Progressing   Problem: Safety: Goal: Ability to remain free from injury will improve Outcome: Not Progressing   Problem: Skin Integrity: Goal: Risk for impaired skin integrity will decrease Outcome: Not Progressing   Problem: Education: Goal: Knowledge of disease and its progression will improve Outcome: Not Progressing   Problem: Fluid Volume: Goal: Compliance with measures to maintain balanced fluid volume will improve Outcome: Not Progressing   Problem: Health Behavior/Discharge Planning: Goal: Ability to manage health-related needs will improve Outcome: Not Progressing   Problem: Nutritional: Goal: Ability to make healthy dietary choices will improve Outcome: Not  Progressing   Problem: Clinical Measurements: Goal: Complications related to the disease process, condition or treatment will be avoided or minimized Outcome: Not Progressing

## 2023-03-19 NOTE — Progress Notes (Signed)
[facility]  ROUNDING NOTE   Subjective:   Victor Hernandez  is a 67 year old male with past medical conditions including diabetes, hyperlipidemia, hypertension, congestive heart failure, and chronic kidney disease stage IV.  He presents to the emergency department due to abnormal labs and has been admitted for Acute renal failure (HCC) [N17.9] ESRD (end stage renal disease) (HCC) [N18.6] ESRD needing dialysis (HCC) [N18.6, Z99.2]  Patient is known to our practice from a previous admission.   Patient seen awaiting permcath placement Alert Npo   Objective:  Vital signs in last 24 hours:  Temp:  [97.7 F (36.5 C)-98.3 F (36.8 C)] 98.2 F (36.8 C) (10/21 0801) Pulse Rate:  [62-95] 77 (10/21 1045) Resp:  [0-24] 24 (10/21 1045) BP: (122-181)/(69-117) 147/74 (10/21 1045) SpO2:  [87 %-100 %] 94 % (10/21 1045) Weight:  [85 kg] 85 kg (10/21 0404)  Weight change: -7.051 kg Filed Weights   03/17/23 1842 03/18/23 0424 03/19/23 0404  Weight: 92.1 kg 91.8 kg 85 kg    Intake/Output: No intake/output data recorded.   Intake/Output this shift:  No intake/output data recorded.  Physical Exam: General: NAD  Head: Normocephalic, atraumatic. Moist oral mucosal membranes  Eyes: Anicteric  Lungs:  Clear to auscultation, normal effort  Heart: Regular rate and rhythm  Abdomen:  Soft, nontender,   Extremities: + peripheral edema.  Neurologic: Alert and oriented, moving all four extremities  Skin: No lesions  Access: Right femoral temporary catheter    Basic Metabolic Panel: Recent Labs  Lab 03/12/23 2151 03/13/23 0500 03/14/23 0531 03/15/23 0526 03/16/23 0627 03/17/23 0500 03/19/23 0501  NA  --    < > 145 144 141 144 140  K  --    < > 4.3 4.5 4.3 4.4 4.5  CL  --    < > 109 109 106 107 102  CO2  --    < > 24 25 26 26 28   GLUCOSE  --    < > 113* 135* 201* 120* 113*  BUN  --    < > 98* 81* 75* 85* 65*  CREATININE  --    < > 7.53* 6.28* 5.72* 6.07* 5.39*  CALCIUM   --    < > 8.0* 8.0* 8.4* 8.6* 8.7*  MG 2.2  --   --   --  2.0  --   --   PHOS  --   --  7.3* 6.8* 5.3* 6.0* 5.7*   < > = values in this interval not displayed.    Liver Function Tests: Recent Labs  Lab 03/12/23 1916 03/14/23 0531 03/15/23 0526 03/16/23 0627 03/17/23 0500 03/19/23 0501  AST 17  --   --   --   --   --   ALT 23  --   --   --   --   --   ALKPHOS 117  --   --   --   --   --   BILITOT 0.8  --   --   --   --   --   PROT 6.0*  --   --   --   --   --   ALBUMIN 2.7* 2.5* 2.4* 2.5* 2.5* 2.6*   No results for input(s): "LIPASE", "AMYLASE" in the last 168 hours. Recent Labs  Lab 03/19/23 0501  AMMONIA 13    CBC: Recent Labs  Lab 03/12/23 1916 03/14/23 0531 03/15/23 0805 03/17/23 1501 03/19/23 0501  WBC 7.1 6.4 7.3 5.9 6.3  NEUTROABS 4.9  --   --   --   --   HGB 9.5* 9.4* 8.8* 8.2* 8.5*  HCT 32.4* 31.6* 29.4* 27.4* 28.7*  MCV 88.8 89.3 88.3 87.8 88.0  PLT 271 268 251 211 231    Cardiac Enzymes: No results for input(s): "CKTOTAL", "CKMB", "CKMBINDEX", "TROPONINI" in the last 168 hours.  BNP: Invalid input(s): "POCBNP"  CBG: Recent Labs  Lab 03/18/23 2347 03/19/23 0337 03/19/23 0803 03/19/23 0920 03/19/23 1148  GLUCAP 87 102* 100* 96 96    Microbiology: Results for orders placed or performed during the hospital encounter of 03/12/23  SARS Coronavirus 2 by RT PCR (hospital order, performed in [facility] hospital lab) *cepheid single result test* Anterior Nasal Swab     Status: None   Collection Time: 03/12/23  9:52 PM   Specimen: Anterior Nasal Swab  Result Value Ref Range Status   SARS Coronavirus 2 by RT PCR NEGATIVE NEGATIVE Final    Comment: (NOTE) SARS-CoV-2 target nucleic acids are NOT DETECTED.  The SARS-CoV-2 RNA is generally detectable in upper and lower respiratory specimens during the acute phase of infection. The lowest concentration of SARS-CoV-2 viral copies this assay can detect is 250 copies / mL. A negative result does not  preclude SARS-CoV-2 infection and should not be used as the sole basis for treatment or other patient management decisions.  A negative result may occur with improper specimen collection / handling, submission of specimen other than nasopharyngeal swab, presence of viral mutation(s) within the areas targeted by this assay, and inadequate number of viral copies (<250 copies / mL). A negative result must be combined with clinical observations, patient history, and epidemiological information.  Fact Sheet for Patients:   RoadLapTop.co.za  Fact Sheet for Healthcare Providers: http://kim-miller.com/  This test is not yet approved or  cleared by the [place] FDA and has been authorized for detection and/or diagnosis of SARS-CoV-2 by FDA under an Emergency Use Authorization (EUA).  This EUA will remain in effect (meaning this test can be used) for the duration of the COVID-19 declaration under Section 564(b)(1) of the Act, 21 U.S.C. section 360bbb-3(b)(1), unless the authorization is terminated or revoked sooner.  Performed at [facility], [address] [place], [place] [address]     Coagulation Studies: No results for input(s): "LABPROT", "INR" in the last 72 hours.  Urinalysis: No results for input(s): "COLORURINE", "LABSPEC", "PHURINE", "GLUCOSEU", "HGBUR", "BILIRUBINUR", "KETONESUR", "PROTEINUR", "UROBILINOGEN", "NITRITE", "LEUKOCYTESUR" in the last 72 hours.  Invalid input(s): "APPERANCEUR"    Imaging: PERIPHERAL VASCULAR CATHETERIZATION  Result Date: 03/19/2023 See surgical note for result.    Medications:    sodium chloride Stopped (03/19/23 1000)    amLODipine  10 mg Oral Daily   aspirin EC  81 mg Oral Daily   atorvastatin  40 mg Oral q morning   calcitRIOL  0.25 mcg Oral Daily   calcium acetate  1,334 mg Oral TID WC   Chlorhexidine Gluconate Cloth  6 each Topical Q0600   feeding supplement (NEPRO  CARB STEADY)  237 mL Oral TID BM   heparin  5,000 Units Subcutaneous Q8H   insulin aspart  0-9 Units Subcutaneous Q4H   melatonin  5 mg Oral QHS   metoprolol tartrate  50 mg Oral BID   multivitamin  1 tablet Oral QHS   QUEtiapine  25 mg Oral BID   torsemide  100 mg Oral Daily   acetaminophen **OR** acetaminophen, hydrALAZINE, ondansetron **OR** ondansetron (ZOFRAN) IV, mouth rinse  Assessment/ Plan:  Victor Hernandez is a 67 y.o.  male with past medical conditions including diabetes, hyperlipidemia, hypertension, congestive heart failure, and chronic kidney disease stage IV.  He presents to the emergency department due to abnormal labs and has been admitted for Acute renal failure (HCC) [N17.9] ESRD (end stage renal disease) (HCC) [N18.6] ESRD needing dialysis (HCC) [N18.6, Z99.2]   End-stage renal disease requiring hemodialysis with Volume overload.    Long-term plan includes PD catheter placement with transition to peritoneal dialysis. Dialysis initiated on 03/14/2023.  Appreciate vascula placing rt chest permcath today. Next dialysis treatment scheduled for Tuesday. Renal navigator seeking outpatient dialysis clinic.   2. Anemia of chronic kidney disease Lab Results  Component Value Date   HGB 8.5 (L) 03/19/2023    Hemoglobin remains decreased. Will order low dose EPO with dialysis.   3. Secondary Hyperparathyroidism: with outpatient labs: PTH 1123, phosphorus 6.8, calcium 9.2 on 04/17/22.   Lab Results  Component Value Date   CALCIUM 8.7 (L) 03/19/2023   PHOS 5.7 (H) 03/19/2023  Phosphorus slightly elevated.  Continue oral calcitriol and phosphorus binders.  4.  Hypertension with chronic kidney disease.  Outpatient regimen includes amlodipine, hydralazine, metoprolol, and torsemide.  Blood pressure well controlled on these medications.   5. Diabetes mellitus type II with chronic kidney disease/renal manifestations: noninsulin dependent. Home regimen includes [place]. Most  recent hemoglobin A1c is 8.5 on 03/13/2023.  -Agree with discontinuing Marcelline Deist and Finerenone as now he is on dialysis. - glucose well controlled    LOS: 7 Mavis Fichera 10/21/20242:11 PM

## 2023-03-19 NOTE — Progress Notes (Signed)
Patient received from specials with right internal jugular perm cath in place. Alert to verbal stimuli, asking for food.  Cornell Barman Caran Storck

## 2023-03-19 NOTE — Progress Notes (Signed)
Progress Note   Patient: Victor Hernandez:[id number] DOB: 1956-03-06 DOA: 03/12/2023     7 DOS: the patient was seen and examined on 03/19/2023   Brief hospital course:  MILT URVINA is a 67 y.o. male with medical history significant for hypertension, insulin-dependent type 2 diabetes mellitus, HFpEF, severe mitral regurg on echo 2022, CKD stage IV-V.   Presented to ED from PCP for abnormal blood work, specifically worsening renal function.  Patient had been reporting increasing lower extremity edema dyspnea on exertion and orthopnea.    ED Course: BP 194/119 on arrival with pulse in the 90s and low 100s and tachypneic at times to 33 with O2 sat intermittently dipping to as low as 86% on room air. Labs: BNP 2518,WBC, hgb 9.5 which appears to be his baseline CMP: Cr 7.28 with normal bicarb of 23 and potassium 4.1, GFR 8 EKG not done Chest x-ray: pulmonary vascular congestion Patient was treated with IV labetalol oral hydralazine and metoprolol and also given a dose of IV Lasix.    Nephrology was consulted    Patient was admitted to medicine service for further workup and management of hypertensive emergency with pulmonary edema and acute renal failure.   Further hospital course and management as outlined in detail below.   10/15 -- R femoral temp dialysis line placed by Vascular surgery 10/16 -- pt tolerated initial dialysis session this AM 10/17 -- dialysis 10/18 -- perm cath placement cancelled.  Short runs of NSVT (9 and 12 beats), pt asymptomatic.  10/19-20 -- pt confused, paranoid, intermittently refusing meds, dialysis.  Pulled out temp HD line overnight 10/21 -- perm cath placed, HD planned tomorrow    Assessment and Plan:  Hypertensive emergency- resolved 190 systolics on admission - continue home amlodipine and hydralazine.  - titrate regimen for adequate BP control - monitor for fluctuation with initiation of HD   Acute respiratory failure with hypoxia, POA-  resolved.  Secondary to vascular congestion  / fluid overload (multifactorial with malignant HTN and worsening renal function).  No oxygen use at baseline. originally on 2L O2, now on room air.   Denies respiratory distress, SOB with ambulating to the bathroom - IV lasix attempted but ultimately required dialysis - continue dialysis per nephrology - now on torsemide 100 mg daily - echo on 10/15 showed EF 45-50%, grade III diastolic dysfunction, mod-severe MR, mild-mod TR, small pericardial effusion - monitor volume status   ESRD needing dialysis (HCC)- Cr greatly worsened and having hypervolemia with end-organ failure. Endorses 3 voids today which was not recorded. Endorses improvement in his LE edema since admission Nephrology consulted  -Recommended initiation of HD Vascular consulted R femoralTDC placed 10/15, removed by patient overnight 10/20-21 Dialysis initiated 10/16 - Vascular surgery placing PermCath 10/21 - strict I/O - BMP am  Non-sustained V-tach - 10/18, two runs reported, 9 and 12 beats.  Pt was asymptomatic.  K and Mg are at goal. Likely due to renal failure. --Telemetry --Maintain K>4, Mg>2 --Cardiology consulted - appreciate recs --Metoprolol increased     Acute on chronic diastolic CHF  (HCC)- echo read pending - continue home metoprolol   Diabetes mellitus, type II (HCC) Sliding scale insulin coverage   Anemia of chronic kidney failure, stage 5 (HCC) Appears at baseline - CBC am        Subjective: Pt sleeping comfortably on rounds this AM. Woke briefly to voice, denied complaints. RN reported by secure chat earlier this AM that pt removed his temp dialysis line  overnight. Pt has been having confusion, paranoia, delirium, mental status has waxed and waned.   Physical Exam: Vitals:   03/19/23 1007 03/19/23 1016 03/19/23 1033 03/19/23 1045  BP: (!) 163/100 (!) 148/82 138/69 (!) 147/74  Pulse: 95 95 74 77  Resp: 16 (!) 21 12 (!) 24  Temp:       TempSrc:      SpO2: 97% (!) 89% 92% 94%  Weight:      Height:       General exam: somnolent, wakes briefly to voice, no acute distress HEENT: moist mucus membranes, hearing grossly normal  Respiratory system: om room air, normal respiratory effort. CTAB no wheezes Cardiovascular system: RRR, trace LE edema.   Gastrointestinal system: soft, NT, ND Central nervous system: limited exam due to somnolence Extremities: R fem dialysis catheter removed, so active bleeding at site Psychiatry: limited exam due to somnolence   Data Reviewed:  Notable labs ---  glucose 113, BUN 65, Cr 5.39, Ca 8.7, phos 5.7, albumin 2.6 Hbg 8.2 >> 8.5  Family Communication: none present, will attempt to call  Disposition: Status is: Inpatient Remains inpatient appropriate because: initiated on hemodialysis, will remain admitted pending outpatient dialysis chair time.     Planned Discharge Destination: Home    Time spent: 35 minutes  Author: Pennie Banter, DO 03/19/2023 4:11 PM  For on call review [email or web address].

## 2023-03-19 NOTE — Plan of Care (Signed)
Victor Hernandez

## 2023-03-20 LAB — GLUCOSE, CAPILLARY
Glucose-Capillary: 101 mg/dL — ABNORMAL HIGH (ref 70–99)
Glucose-Capillary: 117 mg/dL — ABNORMAL HIGH (ref 70–99)
Glucose-Capillary: 77 mg/dL (ref 70–99)
Glucose-Capillary: 186 mg/dL — ABNORMAL HIGH (ref 70–99)

## 2023-03-20 LAB — RENAL FUNCTION PANEL
Albumin: 2.5 g/dL — ABNORMAL LOW (ref 3.5–5.0)
Anion gap: 11 (ref 5–15)
BUN: 79 mg/dL — ABNORMAL HIGH (ref 8–23)
CO2: 27 mmol/L (ref 22–32)
Calcium: 8.4 mg/dL — ABNORMAL LOW (ref 8.9–10.3)
Chloride: 102 mmol/L (ref 98–111)
Creatinine, Ser: 5.87 mg/dL — ABNORMAL HIGH (ref 0.61–1.24)
GFR, Estimated: 10 mL/min — ABNORMAL LOW (ref >60)
Glucose, Bld: 125 mg/dL — ABNORMAL HIGH (ref 70–99)
Phosphorus: 6.1 mg/dL — ABNORMAL HIGH (ref 2.5–4.6)
Potassium: 4.7 mmol/L (ref 3.5–5.1)
Sodium: 140 mmol/L (ref 135–145)

## 2023-03-20 MED ORDER — RENA-VITE PO TABS: 1.0000 | ORAL_TABLET | Freq: Every day | ORAL | 0 refills | Status: AC

## 2023-03-20 MED ORDER — CALCITRIOL 0.25 MCG PO CAPS: 0.2500 ug | ORAL_CAPSULE | Freq: Every day | ORAL | 0 refills | Status: AC

## 2023-03-20 MED ORDER — METOPROLOL TARTRATE 50 MG PO TABS: 50.0000 mg | ORAL_TABLET | Freq: Two times a day (BID) | ORAL | 1 refills | Status: AC

## 2023-03-20 MED ORDER — QUETIAPINE FUMARATE 25 MG PO TABS: 25.0000 mg | ORAL_TABLET | Freq: Every day | ORAL | 1 refills | Status: DC

## 2023-03-20 MED ORDER — CALCIUM ACETATE (PHOS BINDER) 667 MG PO CAPS: 1334.0000 mg | ORAL_CAPSULE | Freq: Three times a day (TID) | ORAL | 0 refills | Status: AC

## 2023-03-20 MED ORDER — EPOETIN ALFA 4000 UNIT/ML IJ SOLN
INTRAMUSCULAR | Status: AC
  Filled 2023-03-20: qty 1

## 2023-03-20 MED ORDER — EPOETIN ALFA 10000 UNIT/ML IJ SOLN
4000.0000 [IU] | Freq: Once | INTRAMUSCULAR | Status: AC
  Administered 2023-03-20: 4000 [IU] via INTRAVENOUS

## 2023-03-20 MED ORDER — ACETAMINOPHEN 325 MG PO TABS: 650.0000 mg | ORAL_TABLET | Freq: Four times a day (QID) | ORAL | Status: DC | PRN

## 2023-03-20 MED ORDER — MELATONIN 5 MG PO TABS: 5.0000 mg | ORAL_TABLET | Freq: Every day | ORAL | Status: AC

## 2023-03-20 MED ORDER — NEPRO/CARBSTEADY PO LIQD: 237.0000 mL | Freq: Three times a day (TID) | ORAL | Status: DC

## 2023-03-20 NOTE — Plan of Care (Addendum)
PMT consult noted for GOC. Patient is currently off unit and in dialysis. Will reattempt visit at another time.

## 2023-03-20 NOTE — Progress Notes (Signed)
[facility]  ROUNDING NOTE   Subjective:   Victor Hernandez  is a 67 year old male with past medical conditions including diabetes, hyperlipidemia, hypertension, congestive heart failure, and chronic kidney disease stage IV.  He presents to the emergency department due to abnormal labs and has been admitted for Acute renal failure (HCC) [N17.9] ESRD (end stage renal disease) (HCC) [N18.6] ESRD needing dialysis (HCC) [N18.6, Z99.2]  Patient is known to our practice from a previous admission.   Patient seen and evaluated during dialysis   HEMODIALYSIS FLOWSHEET:  Blood Flow Rate (mL/min): 399 mL/min Arterial Pressure (mmHg): -207.46 mmHg Venous Pressure (mmHg): 171.3 mmHg TMP (mmHg): 6.87 mmHg Ultrafiltration Rate (mL/min): 829 mL/min Dialysate Flow Rate (mL/min): 299 ml/min Dialysis Fluid Bolus: Normal Saline  Complains of fatigue   Objective:  Vital signs in last 24 hours:  Temp:  [97.9 F (36.6 C)-98.3 F (36.8 C)] 97.9 F (36.6 C) (10/22 0837) Pulse Rate:  [55-73] 58 (10/22 1000) Resp:  [10-23] 22 (10/22 1000) BP: (117-160)/(67-94) 141/79 (10/22 1000) SpO2:  [91 %-100 %] 100 % (10/22 1000) Weight:  [85.1 kg-89.2 kg] 85.1 kg (10/22 0834)  Weight change: 4.151 kg Filed Weights   03/19/23 0404 03/20/23 0336 03/20/23 0834  Weight: 85 kg 89.2 kg 85.1 kg    Intake/Output: I/O last 3 completed shifts: In: 480 [P.O.:480] Out: -    Intake/Output this shift:  No intake/output data recorded.  Physical Exam: General: NAD  Head: Normocephalic, atraumatic. Moist oral mucosal membranes  Eyes: Anicteric  Lungs:  Clear to auscultation, normal effort  Heart: Regular rate and rhythm  Abdomen:  Soft, nontender  Extremities: + peripheral edema.  Neurologic: Alert and oriented, moving all four extremities  Skin: No lesions  Access: Rt chest permcath placed on 03/19/23    Basic Metabolic Panel: Recent Labs  Lab 03/15/23 0526 03/16/23 0627 03/17/23 0500  03/19/23 0501 03/20/23 0811  NA 144 141 144 140 140  K 4.5 4.3 4.4 4.5 4.7  CL 109 106 107 102 102  CO2 25 26 26 28 27   GLUCOSE 135* 201* 120* 113* 125*  BUN 81* 75* 85* 65* 79*  CREATININE 6.28* 5.72* 6.07* 5.39* 5.87*  CALCIUM 8.0* 8.4* 8.6* 8.7* 8.4*  MG  --  2.0  --   --   --   PHOS 6.8* 5.3* 6.0* 5.7* 6.1*    Liver Function Tests: Recent Labs  Lab 03/15/23 0526 03/16/23 0627 03/17/23 0500 03/19/23 0501 03/20/23 0811  ALBUMIN 2.4* 2.5* 2.5* 2.6* 2.5*   No results for input(s): "LIPASE", "AMYLASE" in the last 168 hours. Recent Labs  Lab 03/19/23 0501  AMMONIA 13    CBC: Recent Labs  Lab 03/14/23 0531 03/15/23 0805 03/17/23 1501 03/19/23 0501  WBC 6.4 7.3 5.9 6.3  HGB 9.4* 8.8* 8.2* 8.5*  HCT 31.6* 29.4* 27.4* 28.7*  MCV 89.3 88.3 87.8 88.0  PLT 268 251 211 231    Cardiac Enzymes: No results for input(s): "CKTOTAL", "CKMB", "CKMBINDEX", "TROPONINI" in the last 168 hours.  BNP: Invalid input(s): "POCBNP"  CBG: Recent Labs  Lab 03/19/23 1621 03/19/23 1924 03/19/23 2319 03/20/23 0334 03/20/23 0751  GLUCAP 199* 268* 180* 101* 117*    Microbiology: Results for orders placed or performed during the hospital encounter of 03/12/23  SARS Coronavirus 2 by RT PCR (hospital order, performed in [facility] hospital lab) *cepheid single result test* Anterior Nasal Swab     Status: None   Collection Time: 03/12/23  9:52 PM   Specimen:  Anterior Nasal Swab  Result Value Ref Range Status   SARS Coronavirus 2 by RT PCR NEGATIVE NEGATIVE Final    Comment: (NOTE) SARS-CoV-2 target nucleic acids are NOT DETECTED.  The SARS-CoV-2 RNA is generally detectable in upper and lower respiratory specimens during the acute phase of infection. The lowest concentration of SARS-CoV-2 viral copies this assay can detect is 250 copies / mL. A negative result does not preclude SARS-CoV-2 infection and should not be used as the sole basis for treatment or other patient  management decisions.  A negative result may occur with improper specimen collection / handling, submission of specimen other than nasopharyngeal swab, presence of viral mutation(s) within the areas targeted by this assay, and inadequate number of viral copies (<250 copies / mL). A negative result must be combined with clinical observations, patient history, and epidemiological information.  Fact Sheet for Patients:   RoadLapTop.co.za  Fact Sheet for Healthcare Providers: http://kim-miller.com/  This test is not yet approved or  cleared by the [place] FDA and has been authorized for detection and/or diagnosis of SARS-CoV-2 by FDA under an Emergency Use Authorization (EUA).  This EUA will remain in effect (meaning this test can be used) for the duration of the COVID-19 declaration under Section 564(b)(1) of the Act, 21 U.S.C. section 360bbb-3(b)(1), unless the authorization is terminated or revoked sooner.  Performed at [facility], [address] [place], [place] [address]     Coagulation Studies: No results for input(s): "LABPROT", "INR" in the last 72 hours.  Urinalysis: No results for input(s): "COLORURINE", "LABSPEC", "PHURINE", "GLUCOSEU", "HGBUR", "BILIRUBINUR", "KETONESUR", "PROTEINUR", "UROBILINOGEN", "NITRITE", "LEUKOCYTESUR" in the last 72 hours.  Invalid input(s): "APPERANCEUR"    Imaging: PERIPHERAL VASCULAR CATHETERIZATION  Result Date: 03/19/2023 See surgical note for result.    Medications:      amLODipine  10 mg Oral Daily   aspirin EC  81 mg Oral Daily   atorvastatin  40 mg Oral q morning   calcitRIOL  0.25 mcg Oral Daily   calcium acetate  1,334 mg Oral TID WC   Chlorhexidine Gluconate Cloth  6 each Topical Q0600   epoetin (EPOGEN/PROCRIT) injection  4,000 Units Intravenous Once   feeding supplement (NEPRO CARB STEADY)  237 mL Oral TID BM   heparin  5,000 Units Subcutaneous Q8H    insulin aspart  0-9 Units Subcutaneous Q4H   melatonin  5 mg Oral QHS   metoprolol tartrate  50 mg Oral BID   multivitamin  1 tablet Oral QHS   QUEtiapine  25 mg Oral BID   torsemide  100 mg Oral Daily   acetaminophen **OR** acetaminophen, hydrALAZINE, ondansetron **OR** ondansetron (ZOFRAN) IV, mouth rinse  Assessment/ Plan:  Mr. CARROL POMPLUN is a 67 y.o.  male with past medical conditions including diabetes, hyperlipidemia, hypertension, congestive heart failure, and chronic kidney disease stage IV.  He presents to the emergency department due to abnormal labs and has been admitted for Acute renal failure (HCC) [N17.9] ESRD (end stage renal disease) (HCC) [N18.6] ESRD needing dialysis (HCC) [N18.6, Z99.2]   End-stage renal disease requiring hemodialysis with Volume overload.    Long-term plan includes PD catheter placement with transition to peritoneal dialysis. Dialysis initiated on 03/14/2023.  Appreciate vascula placing rt chest permcath on 03/19/23. Receiving dialysis today, UF goal 2L.  Renal navigator has confirmed outpatient dialysis clinic at [facility] on a MWF schedule.   2. Anemia of chronic kidney disease Lab Results  Component Value Date  HGB 8.5 (L) 03/19/2023    Hemoglobin remains decreased. Will order low dose EPO with dialysis.   3. Secondary Hyperparathyroidism: with outpatient labs: PTH 1123, phosphorus 6.8, calcium 9.2 on 04/17/22.   Lab Results  Component Value Date   CALCIUM 8.4 (L) 03/20/2023   PHOS 6.1 (H) 03/20/2023   Continue daily calcitriol and and calcium acetate with meals  4.  Hypertension with chronic kidney disease.  Outpatient regimen includes amlodipine, hydralazine, metoprolol, and torsemide.  Blood pressure 147/89 during dialysis  5. Diabetes mellitus type II with chronic kidney disease/renal manifestations: noninsulin dependent. Home regimen includes [place]. Most recent hemoglobin A1c is 8.5 on 03/13/2023.  -Agree with  discontinuing Marcelline Deist and Finerenone as now he is on dialysis. - SSI managed by primary team    LOS: 8 Addley Ballinger 10/22/202410:48 AM

## 2023-03-20 NOTE — TOC Transition Note (Signed)
Transition of Care [facility]) - CM/SW Discharge Note   Patient Details  Name: Victor Hernandez MRN: [id number] Date of Birth: 1955/11/10  Transition of Care [facility]) CM/SW Contact:  Truddie Hidden, RN Phone Number: 03/20/2023, 3:54 PM   Clinical Narrative:    Spoke with patient's son. He will transport patient home.   TOC signing off.           Patient Goals and CMS Choice      Discharge Placement                         Discharge Plan and Services Additional resources added to the After Visit Summary for                                       Social Determinants of Health (SDOH) Interventions SDOH Screenings   Food Insecurity: No Food Insecurity (03/13/2023)  Housing: Low Risk  (03/13/2023)  Transportation Needs: No Transportation Needs (03/13/2023)  Utilities: Not At Risk (03/13/2023)  Depression (PHQ2-9): Low Risk  (07/14/2021)  Tobacco Use: Medium Risk (03/13/2023)     Readmission Risk Interventions     No data to display

## 2023-03-20 NOTE — Care Management Important Message (Signed)
Important Message  Patient Details  Name: Victor Hernandez MRN: [id number] Date of Birth: 17-Apr-1956   Important Message Given:  Yes - Medicare IM     Truddie Hidden, RN 03/20/2023, 3:53 PM

## 2023-03-20 NOTE — TOC Progression Note (Signed)
Transition of Care [facility]) - Progression Note    Patient Details  Name: Victor Hernandez MRN: [id number] Date of Birth: [birth year]  [facility]) CM/SW Contact  Truddie Hidden, RN Phone Number: 03/20/2023, 1:31 PM  Clinical Narrative:    TOC continuing to follow patient's progress throughout discharge planning.        Expected Discharge Plan and Services                                               Social Determinants of Health (SDOH) Interventions SDOH Screenings   Food Insecurity: No Food Insecurity (03/13/2023)  Housing: Low Risk  (03/13/2023)  Transportation Needs: No Transportation Needs (03/13/2023)  Utilities: Not At Risk (03/13/2023)  Depression (PHQ2-9): Low Risk  (07/14/2021)  Tobacco Use: Medium Risk (03/13/2023)    Readmission Risk Interventions     No data to display

## 2023-03-20 NOTE — Discharge Summary (Signed)
Physician Discharge Summary   Patient: Victor Hernandez MRN: [id number] DOB: 03/20/1956  Admit date:     03/12/2023  Discharge date: 03/19/2023  Discharge Physician: Pennie Banter   PCP: No primary care provider on file.   Recommendations at discharge:    Follow up with Nephrology  Report to outpatient dialysis as scheduled MWF 11:15 AM, starting tomorrow 10/23 Follow up with Vascular surgery Follow up with Primary Care  Discharge Diagnoses: Principal Problem:   Acute on chronic diastolic CHF  (HCC) Active Problems:   Acute respiratory failure with hypoxia (HCC)   ESRD needing dialysis (HCC)   Diabetes mellitus, type II (HCC)   Hypertensive emergency   Anemia of chronic kidney failure, stage 5 (HCC)   Acute renal failure (HCC)   ESRD (end stage renal disease) (HCC)   Encounter for fitting and adjustment of dialysis catheter (HCC)   NSVT (nonsustained ventricular tachycardia) (HCC)   Primary hypertension   Mitral valve insufficiency  Resolved Problems:   * No resolved hospital problems. *  Hospital Course:  JOHAAN BLEND is a 67 y.o. male with medical history significant for hypertension, insulin-dependent type 2 diabetes mellitus, HFpEF, severe mitral regurg on echo 2022, CKD stage IV-V.   Presented to ED from PCP for abnormal blood work, specifically worsening renal function.  Patient had been reporting increasing lower extremity edema dyspnea on exertion and orthopnea.    ED Course: BP 194/119 on arrival with pulse in the 90s and low 100s and tachypneic at times to 33 with O2 sat intermittently dipping to as low as 86% on room air. Labs: BNP 2518,WBC, hgb 9.5 which appears to be his baseline CMP: Cr 7.28 with normal bicarb of 23 and potassium 4.1, GFR 8 EKG not done Chest x-ray: pulmonary vascular congestion Patient was treated with IV labetalol oral hydralazine and metoprolol and also given a dose of IV Lasix.    Nephrology was consulted    Patient was admitted to  medicine service for further workup and management of hypertensive emergency with pulmonary edema and acute renal failure.     Further hospital course and management as outlined in detail below.     10/15 -- R femoral temp dialysis line placed by Vascular surgery 10/16 -- pt tolerated initial dialysis session this AM 10/17 -- dialysis 10/18 -- perm cath placement cancelled.  Short runs of NSVT (9 and 12 beats), pt asymptomatic.  10/19-20 -- pt confused, paranoid, intermittently refusing meds, dialysis.  Pulled out temp HD line overnight 10/21 -- perm cath placed, HD planned tomorrow  10/22 -- pt doing well, tolerated dialysis with new perm cath.  Mental status is improved and not showing signs of paranoid thinking today.  Pt is medically stable for discharge, to start outpatient hemodialysis tomorrow.     Assessment and Plan:  Hypertensive emergency- resolved 190 systolics on admission - continue home amlodipine and hydralazine.  - titrate regimen for adequate BP control - PCP follow up   Acute respiratory failure with hypoxia, POA- resolved.  Secondary to vascular congestion  / fluid overload (multifactorial with malignant HTN and worsening renal function).  No oxygen use at baseline. originally on 2L O2, now on room air.   Denies respiratory distress, SOB with ambulating to the bathroom - IV lasix attempted but ultimately required dialysis - continue dialysis per nephrology - torsemide 100 mg daily - echo on 10/15 showed EF 45-50%, grade III diastolic dysfunction, mod-severe MR, mild-mod TR, small pericardial effusion - monitor volume  Physician Discharge Summary   Patient: Victor Hernandez MRN: [id number] DOB: 03/20/1956  Admit date:     03/12/2023  Discharge date: 03/19/2023  Discharge Physician: Pennie Banter   PCP: No primary care provider on file.   Recommendations at discharge:    Follow up with Nephrology  Report to outpatient dialysis as scheduled MWF 11:15 AM, starting tomorrow 10/23 Follow up with Vascular surgery Follow up with Primary Care  Discharge Diagnoses: Principal Problem:   Acute on chronic diastolic CHF  (HCC) Active Problems:   Acute respiratory failure with hypoxia (HCC)   ESRD needing dialysis (HCC)   Diabetes mellitus, type II (HCC)   Hypertensive emergency   Anemia of chronic kidney failure, stage 5 (HCC)   Acute renal failure (HCC)   ESRD (end stage renal disease) (HCC)   Encounter for fitting and adjustment of dialysis catheter (HCC)   NSVT (nonsustained ventricular tachycardia) (HCC)   Primary hypertension   Mitral valve insufficiency  Resolved Problems:   * No resolved hospital problems. *  Hospital Course:  JOHAAN BLEND is a 67 y.o. male with medical history significant for hypertension, insulin-dependent type 2 diabetes mellitus, HFpEF, severe mitral regurg on echo 2022, CKD stage IV-V.   Presented to ED from PCP for abnormal blood work, specifically worsening renal function.  Patient had been reporting increasing lower extremity edema dyspnea on exertion and orthopnea.    ED Course: BP 194/119 on arrival with pulse in the 90s and low 100s and tachypneic at times to 33 with O2 sat intermittently dipping to as low as 86% on room air. Labs: BNP 2518,WBC, hgb 9.5 which appears to be his baseline CMP: Cr 7.28 with normal bicarb of 23 and potassium 4.1, GFR 8 EKG not done Chest x-ray: pulmonary vascular congestion Patient was treated with IV labetalol oral hydralazine and metoprolol and also given a dose of IV Lasix.    Nephrology was consulted    Patient was admitted to  medicine service for further workup and management of hypertensive emergency with pulmonary edema and acute renal failure.     Further hospital course and management as outlined in detail below.     10/15 -- R femoral temp dialysis line placed by Vascular surgery 10/16 -- pt tolerated initial dialysis session this AM 10/17 -- dialysis 10/18 -- perm cath placement cancelled.  Short runs of NSVT (9 and 12 beats), pt asymptomatic.  10/19-20 -- pt confused, paranoid, intermittently refusing meds, dialysis.  Pulled out temp HD line overnight 10/21 -- perm cath placed, HD planned tomorrow  10/22 -- pt doing well, tolerated dialysis with new perm cath.  Mental status is improved and not showing signs of paranoid thinking today.  Pt is medically stable for discharge, to start outpatient hemodialysis tomorrow.     Assessment and Plan:  Hypertensive emergency- resolved 190 systolics on admission - continue home amlodipine and hydralazine.  - titrate regimen for adequate BP control - PCP follow up   Acute respiratory failure with hypoxia, POA- resolved.  Secondary to vascular congestion  / fluid overload (multifactorial with malignant HTN and worsening renal function).  No oxygen use at baseline. originally on 2L O2, now on room air.   Denies respiratory distress, SOB with ambulating to the bathroom - IV lasix attempted but ultimately required dialysis - continue dialysis per nephrology - torsemide 100 mg daily - echo on 10/15 showed EF 45-50%, grade III diastolic dysfunction, mod-severe MR, mild-mod TR, small pericardial effusion - monitor volume  Physician Discharge Summary   Patient: Victor Hernandez MRN: [id number] DOB: 03/20/1956  Admit date:     03/12/2023  Discharge date: 03/19/2023  Discharge Physician: Pennie Banter   PCP: No primary care provider on file.   Recommendations at discharge:    Follow up with Nephrology  Report to outpatient dialysis as scheduled MWF 11:15 AM, starting tomorrow 10/23 Follow up with Vascular surgery Follow up with Primary Care  Discharge Diagnoses: Principal Problem:   Acute on chronic diastolic CHF  (HCC) Active Problems:   Acute respiratory failure with hypoxia (HCC)   ESRD needing dialysis (HCC)   Diabetes mellitus, type II (HCC)   Hypertensive emergency   Anemia of chronic kidney failure, stage 5 (HCC)   Acute renal failure (HCC)   ESRD (end stage renal disease) (HCC)   Encounter for fitting and adjustment of dialysis catheter (HCC)   NSVT (nonsustained ventricular tachycardia) (HCC)   Primary hypertension   Mitral valve insufficiency  Resolved Problems:   * No resolved hospital problems. *  Hospital Course:  JOHAAN BLEND is a 67 y.o. male with medical history significant for hypertension, insulin-dependent type 2 diabetes mellitus, HFpEF, severe mitral regurg on echo 2022, CKD stage IV-V.   Presented to ED from PCP for abnormal blood work, specifically worsening renal function.  Patient had been reporting increasing lower extremity edema dyspnea on exertion and orthopnea.    ED Course: BP 194/119 on arrival with pulse in the 90s and low 100s and tachypneic at times to 33 with O2 sat intermittently dipping to as low as 86% on room air. Labs: BNP 2518,WBC, hgb 9.5 which appears to be his baseline CMP: Cr 7.28 with normal bicarb of 23 and potassium 4.1, GFR 8 EKG not done Chest x-ray: pulmonary vascular congestion Patient was treated with IV labetalol oral hydralazine and metoprolol and also given a dose of IV Lasix.    Nephrology was consulted    Patient was admitted to  medicine service for further workup and management of hypertensive emergency with pulmonary edema and acute renal failure.     Further hospital course and management as outlined in detail below.     10/15 -- R femoral temp dialysis line placed by Vascular surgery 10/16 -- pt tolerated initial dialysis session this AM 10/17 -- dialysis 10/18 -- perm cath placement cancelled.  Short runs of NSVT (9 and 12 beats), pt asymptomatic.  10/19-20 -- pt confused, paranoid, intermittently refusing meds, dialysis.  Pulled out temp HD line overnight 10/21 -- perm cath placed, HD planned tomorrow  10/22 -- pt doing well, tolerated dialysis with new perm cath.  Mental status is improved and not showing signs of paranoid thinking today.  Pt is medically stable for discharge, to start outpatient hemodialysis tomorrow.     Assessment and Plan:  Hypertensive emergency- resolved 190 systolics on admission - continue home amlodipine and hydralazine.  - titrate regimen for adequate BP control - PCP follow up   Acute respiratory failure with hypoxia, POA- resolved.  Secondary to vascular congestion  / fluid overload (multifactorial with malignant HTN and worsening renal function).  No oxygen use at baseline. originally on 2L O2, now on room air.   Denies respiratory distress, SOB with ambulating to the bathroom - IV lasix attempted but ultimately required dialysis - continue dialysis per nephrology - torsemide 100 mg daily - echo on 10/15 showed EF 45-50%, grade III diastolic dysfunction, mod-severe MR, mild-mod TR, small pericardial effusion - monitor volume  Physician Discharge Summary   Patient: Victor Hernandez MRN: [id number] DOB: 03/20/1956  Admit date:     03/12/2023  Discharge date: 03/19/2023  Discharge Physician: Pennie Banter   PCP: No primary care provider on file.   Recommendations at discharge:    Follow up with Nephrology  Report to outpatient dialysis as scheduled MWF 11:15 AM, starting tomorrow 10/23 Follow up with Vascular surgery Follow up with Primary Care  Discharge Diagnoses: Principal Problem:   Acute on chronic diastolic CHF  (HCC) Active Problems:   Acute respiratory failure with hypoxia (HCC)   ESRD needing dialysis (HCC)   Diabetes mellitus, type II (HCC)   Hypertensive emergency   Anemia of chronic kidney failure, stage 5 (HCC)   Acute renal failure (HCC)   ESRD (end stage renal disease) (HCC)   Encounter for fitting and adjustment of dialysis catheter (HCC)   NSVT (nonsustained ventricular tachycardia) (HCC)   Primary hypertension   Mitral valve insufficiency  Resolved Problems:   * No resolved hospital problems. *  Hospital Course:  JOHAAN BLEND is a 67 y.o. male with medical history significant for hypertension, insulin-dependent type 2 diabetes mellitus, HFpEF, severe mitral regurg on echo 2022, CKD stage IV-V.   Presented to ED from PCP for abnormal blood work, specifically worsening renal function.  Patient had been reporting increasing lower extremity edema dyspnea on exertion and orthopnea.    ED Course: BP 194/119 on arrival with pulse in the 90s and low 100s and tachypneic at times to 33 with O2 sat intermittently dipping to as low as 86% on room air. Labs: BNP 2518,WBC, hgb 9.5 which appears to be his baseline CMP: Cr 7.28 with normal bicarb of 23 and potassium 4.1, GFR 8 EKG not done Chest x-ray: pulmonary vascular congestion Patient was treated with IV labetalol oral hydralazine and metoprolol and also given a dose of IV Lasix.    Nephrology was consulted    Patient was admitted to  medicine service for further workup and management of hypertensive emergency with pulmonary edema and acute renal failure.     Further hospital course and management as outlined in detail below.     10/15 -- R femoral temp dialysis line placed by Vascular surgery 10/16 -- pt tolerated initial dialysis session this AM 10/17 -- dialysis 10/18 -- perm cath placement cancelled.  Short runs of NSVT (9 and 12 beats), pt asymptomatic.  10/19-20 -- pt confused, paranoid, intermittently refusing meds, dialysis.  Pulled out temp HD line overnight 10/21 -- perm cath placed, HD planned tomorrow  10/22 -- pt doing well, tolerated dialysis with new perm cath.  Mental status is improved and not showing signs of paranoid thinking today.  Pt is medically stable for discharge, to start outpatient hemodialysis tomorrow.     Assessment and Plan:  Hypertensive emergency- resolved 190 systolics on admission - continue home amlodipine and hydralazine.  - titrate regimen for adequate BP control - PCP follow up   Acute respiratory failure with hypoxia, POA- resolved.  Secondary to vascular congestion  / fluid overload (multifactorial with malignant HTN and worsening renal function).  No oxygen use at baseline. originally on 2L O2, now on room air.   Denies respiratory distress, SOB with ambulating to the bathroom - IV lasix attempted but ultimately required dialysis - continue dialysis per nephrology - torsemide 100 mg daily - echo on 10/15 showed EF 45-50%, grade III diastolic dysfunction, mod-severe MR, mild-mod TR, small pericardial effusion - monitor volume  Fe) MG Tbcr Generic drug: Ferrous Sulfate Take 1 tablet by mouth daily.   torsemide 100 MG tablet Commonly known as: DEMADEX Take 1 tablet (100 mg total) by mouth daily. What changed: how much to take        Discharge Exam: Filed Weights   03/20/23 0336 03/20/23 0834 03/20/23 1241  Weight: 89.2 kg 85.1 kg 83.1 kg   General exam: awake, alert, no acute distress HEENT: atraumatic, clear conjunctiva, anicteric sclera, moist mucus membranes, hearing grossly normal  Respiratory system: CTAB, no wheezes, rales or rhonchi, normal respiratory effort. Cardiovascular system: normal S1/S2, RRR, R internal jugular Permcath in place Gastrointestinal system: soft, NT, ND Central nervous system: A&O x 3. no gross focal neurologic deficits, normal speech Extremities: moves all, no edema, normal tone Skin: dry, intact, no rashes Psychiatry: normal mood, congruent affect, judgement and insight appear normal   Condition at discharge:  stable  The results of significant diagnostics from this hospitalization (including imaging, microbiology, ancillary and laboratory) are listed below for reference.   Imaging Studies: PERIPHERAL VASCULAR CATHETERIZATION  Result Date: 03/19/2023 See surgical note for result.  [place] UE VEIN MAPPING LEFT (PRE-OP AVF)  Result Date: 03/16/2023 CLINICAL DATA:  Preoperative vascular survey prior to arteriovenous fistula creation, end-stage renal disease EXAM: [place] EXTREM UP VEIN MAPPING COMPARISON:  None Available. FINDINGS: LEFT ARTERIES Wrist Radial Artery: Size 2.55mm Waveform Triphasic Wrist Ulnar Artery: Size 1.50mm Waveform Triphasic Prox. Forearm Radial Artery: Size 3.42mm Waveform Triphasic Upper Arm Brachial Artery: Size 5.68mm Waveform Triphasic LEFT VEINS Forearm Cephalic Vein: Prox 6mm Distal 2mm Depth 20mm Upper Arm Cephalic Vein: Prox 4mm Distal 4mm Depth 5mm Upper Arm Basilic Vein: Prox 3mm Distal 3mm Depth 6mm Upper Arm Brachial Vein: Prox 10mm Distal 5mm Depth 5mm ADDITIONAL LEFT VEINS Axillary Vein:  19mm Subclavian Vein: Patient: Yes Respiratory Phasicity: Present Internal Jugular Vein: Patent: Yes    Respiratory Phasicity: Present Branches > 2 mm: At least 4 branches are present measuring 2 mm or larger. All 4 branches arise from the cephalic vein. IMPRESSION: 1. Relatively small radial artery measuring only 2 mm in diameter at the wrist. 2. Widely patent left veins without evidence of central venous obstruction. 3. At least 4 branches measuring larger than 2 mm in diameter arise from the cephalic vein. Electronically Signed   By: Malachy Moan M.D.   On: 03/16/2023 07:02   ECHOCARDIOGRAM COMPLETE  Result Date: 03/13/2023    ECHOCARDIOGRAM REPORT   Patient Name:   JADARIUS KULISH Date of Exam: 03/13/2023 Medical Rec #:  [id number]    Height:       71.0 in Accession #:    [id number]   Weight:       197.3 lb Date of Birth:  [birth year]    BSA:          2.096 m Patient Age:    67 years     BP:            152/83 mmHg Patient Gender: M            HR:           69 bpm. Exam Location:  [facility] Procedure: 2D Echo, Cardiac Doppler, Color Doppler and Strain Analysis Indications:     CHF-acute diastolic I50.31  History:         Patient has no prior history of Echocardiogram examinations,                  most recent 06/02/2020. CHF;  Physician Discharge Summary   Patient: Victor Hernandez MRN: [id number] DOB: 03/20/1956  Admit date:     03/12/2023  Discharge date: 03/19/2023  Discharge Physician: Pennie Banter   PCP: No primary care provider on file.   Recommendations at discharge:    Follow up with Nephrology  Report to outpatient dialysis as scheduled MWF 11:15 AM, starting tomorrow 10/23 Follow up with Vascular surgery Follow up with Primary Care  Discharge Diagnoses: Principal Problem:   Acute on chronic diastolic CHF  (HCC) Active Problems:   Acute respiratory failure with hypoxia (HCC)   ESRD needing dialysis (HCC)   Diabetes mellitus, type II (HCC)   Hypertensive emergency   Anemia of chronic kidney failure, stage 5 (HCC)   Acute renal failure (HCC)   ESRD (end stage renal disease) (HCC)   Encounter for fitting and adjustment of dialysis catheter (HCC)   NSVT (nonsustained ventricular tachycardia) (HCC)   Primary hypertension   Mitral valve insufficiency  Resolved Problems:   * No resolved hospital problems. *  Hospital Course:  JOHAAN BLEND is a 67 y.o. male with medical history significant for hypertension, insulin-dependent type 2 diabetes mellitus, HFpEF, severe mitral regurg on echo 2022, CKD stage IV-V.   Presented to ED from PCP for abnormal blood work, specifically worsening renal function.  Patient had been reporting increasing lower extremity edema dyspnea on exertion and orthopnea.    ED Course: BP 194/119 on arrival with pulse in the 90s and low 100s and tachypneic at times to 33 with O2 sat intermittently dipping to as low as 86% on room air. Labs: BNP 2518,WBC, hgb 9.5 which appears to be his baseline CMP: Cr 7.28 with normal bicarb of 23 and potassium 4.1, GFR 8 EKG not done Chest x-ray: pulmonary vascular congestion Patient was treated with IV labetalol oral hydralazine and metoprolol and also given a dose of IV Lasix.    Nephrology was consulted    Patient was admitted to  medicine service for further workup and management of hypertensive emergency with pulmonary edema and acute renal failure.     Further hospital course and management as outlined in detail below.     10/15 -- R femoral temp dialysis line placed by Vascular surgery 10/16 -- pt tolerated initial dialysis session this AM 10/17 -- dialysis 10/18 -- perm cath placement cancelled.  Short runs of NSVT (9 and 12 beats), pt asymptomatic.  10/19-20 -- pt confused, paranoid, intermittently refusing meds, dialysis.  Pulled out temp HD line overnight 10/21 -- perm cath placed, HD planned tomorrow  10/22 -- pt doing well, tolerated dialysis with new perm cath.  Mental status is improved and not showing signs of paranoid thinking today.  Pt is medically stable for discharge, to start outpatient hemodialysis tomorrow.     Assessment and Plan:  Hypertensive emergency- resolved 190 systolics on admission - continue home amlodipine and hydralazine.  - titrate regimen for adequate BP control - PCP follow up   Acute respiratory failure with hypoxia, POA- resolved.  Secondary to vascular congestion  / fluid overload (multifactorial with malignant HTN and worsening renal function).  No oxygen use at baseline. originally on 2L O2, now on room air.   Denies respiratory distress, SOB with ambulating to the bathroom - IV lasix attempted but ultimately required dialysis - continue dialysis per nephrology - torsemide 100 mg daily - echo on 10/15 showed EF 45-50%, grade III diastolic dysfunction, mod-severe MR, mild-mod TR, small pericardial effusion - monitor volume  Physician Discharge Summary   Patient: Victor Hernandez MRN: [id number] DOB: 03/20/1956  Admit date:     03/12/2023  Discharge date: 03/19/2023  Discharge Physician: Pennie Banter   PCP: No primary care provider on file.   Recommendations at discharge:    Follow up with Nephrology  Report to outpatient dialysis as scheduled MWF 11:15 AM, starting tomorrow 10/23 Follow up with Vascular surgery Follow up with Primary Care  Discharge Diagnoses: Principal Problem:   Acute on chronic diastolic CHF  (HCC) Active Problems:   Acute respiratory failure with hypoxia (HCC)   ESRD needing dialysis (HCC)   Diabetes mellitus, type II (HCC)   Hypertensive emergency   Anemia of chronic kidney failure, stage 5 (HCC)   Acute renal failure (HCC)   ESRD (end stage renal disease) (HCC)   Encounter for fitting and adjustment of dialysis catheter (HCC)   NSVT (nonsustained ventricular tachycardia) (HCC)   Primary hypertension   Mitral valve insufficiency  Resolved Problems:   * No resolved hospital problems. *  Hospital Course:  JOHAAN BLEND is a 67 y.o. male with medical history significant for hypertension, insulin-dependent type 2 diabetes mellitus, HFpEF, severe mitral regurg on echo 2022, CKD stage IV-V.   Presented to ED from PCP for abnormal blood work, specifically worsening renal function.  Patient had been reporting increasing lower extremity edema dyspnea on exertion and orthopnea.    ED Course: BP 194/119 on arrival with pulse in the 90s and low 100s and tachypneic at times to 33 with O2 sat intermittently dipping to as low as 86% on room air. Labs: BNP 2518,WBC, hgb 9.5 which appears to be his baseline CMP: Cr 7.28 with normal bicarb of 23 and potassium 4.1, GFR 8 EKG not done Chest x-ray: pulmonary vascular congestion Patient was treated with IV labetalol oral hydralazine and metoprolol and also given a dose of IV Lasix.    Nephrology was consulted    Patient was admitted to  medicine service for further workup and management of hypertensive emergency with pulmonary edema and acute renal failure.     Further hospital course and management as outlined in detail below.     10/15 -- R femoral temp dialysis line placed by Vascular surgery 10/16 -- pt tolerated initial dialysis session this AM 10/17 -- dialysis 10/18 -- perm cath placement cancelled.  Short runs of NSVT (9 and 12 beats), pt asymptomatic.  10/19-20 -- pt confused, paranoid, intermittently refusing meds, dialysis.  Pulled out temp HD line overnight 10/21 -- perm cath placed, HD planned tomorrow  10/22 -- pt doing well, tolerated dialysis with new perm cath.  Mental status is improved and not showing signs of paranoid thinking today.  Pt is medically stable for discharge, to start outpatient hemodialysis tomorrow.     Assessment and Plan:  Hypertensive emergency- resolved 190 systolics on admission - continue home amlodipine and hydralazine.  - titrate regimen for adequate BP control - PCP follow up   Acute respiratory failure with hypoxia, POA- resolved.  Secondary to vascular congestion  / fluid overload (multifactorial with malignant HTN and worsening renal function).  No oxygen use at baseline. originally on 2L O2, now on room air.   Denies respiratory distress, SOB with ambulating to the bathroom - IV lasix attempted but ultimately required dialysis - continue dialysis per nephrology - torsemide 100 mg daily - echo on 10/15 showed EF 45-50%, grade III diastolic dysfunction, mod-severe MR, mild-mod TR, small pericardial effusion - monitor volume

## 2023-03-20 NOTE — Consult Note (Signed)
This provider was able to evaluate the client later this afternoon.  He was clear and coherent, pleasant and cooperative.  He stated, "I've not been seeing things" and only hears voices of other people outside of his door.  When inquired about suicidal ideations, he responded with "Why would I want to do that?"  No distress noted or concerns voiced, discharged shortly after the assessment.  Nanine Means, PMHNP

## 2023-03-20 NOTE — Progress Notes (Signed)
Received patient in bed to unit.    Informed consent signed and in chart.    TX duration: 3.5 hr     Transported back to floor  Hand-off given to patient's nurse.   Access used:  rt chest CVC Access issues: n/a  Total UF removed: 2000 mls Medication(s) given: epogen 4000 units Post HD weight: 83.1kg      Maple Hudson, RN Dialysis Unit

## 2023-03-20 NOTE — Discharge Planning (Signed)
ESTABLISHED HEMODIALYSIS Outpatient Facility [facility]  [address] [place] [address] [phone]  Schedule: MWF 11:30am Start date: Wednesday 10/23 11:15am  Dimas Chyle Dialysis Coordinator II  [facility] Cell: [phone] eFax: [phone] [email or web address]

## 2023-06-24 NOTE — Progress Notes (Unsigned)
MRN : [id number]  Victor Hernandez is a 68 y.o. (1955/06/23) male who presents with chief complaint of check access.  History of Present Illness:   The patient is seen for evaluation for dialysis access. The patient has chronic renal insufficiency stage V secondary to hypertension. The patient's most recent creatinine clearance is less than 20. The patient volume status has not yet become an issue. Patient's blood pressures been relatively well controlled. There are mild uremic symptoms which appear to be relatively well tolerated at this time.  The patient notes the kidney problem has been present for a long time and has been progressively getting worse.  The patient is followed by nephrology.    The patient is right-handed.  The patient has been considering the various methods of dialysis and wishes to proceed with hemodialysis and therefore creation of AV access is indicated.  No recent shortening of the patient's walking distance or new symptoms consistent with claudication.  No history of rest pain symptoms. No new ulcers or wounds of the lower extremities have occurred.  The patient denies amaurosis fugax or recent TIA symptoms. There are no recent neurological changes noted. There is no history of DVT, PE or superficial thrombophlebitis. No recent episodes of angina or shortness of breath documented.   No outpatient medications have been marked as taking for the 06/25/23 encounter (Appointment) with Gilda Crease, Latina Craver, MD.    Past Medical History:  Diagnosis Date   CHF (congestive heart failure) (HCC)    Chronic kidney disease    Diabetes mellitus without complication (HCC)    Hyperlipidemia    Hypertension     Past Surgical History:  Procedure Laterality Date   COLONOSCOPY     COLONOSCOPY WITH PROPOFOL N/A 05/19/2016   Procedure: COLONOSCOPY WITH PROPOFOL;  Surgeon: Kieth Brightly, MD;  Location: [facility] ENDOSCOPY;  Service: Endoscopy;   Laterality: N/A;   DIALYSIS/PERMA CATHETER INSERTION Right 03/19/2023   Procedure: DIALYSIS/PERMA CATHETER INSERTION;  Surgeon: Annice Needy, MD;  Location: [facility] INVASIVE CV LAB;  Service: Cardiovascular;  Laterality: Right;   TEMPORARY DIALYSIS CATHETER N/A 03/13/2023   Procedure: TEMPORARY DIALYSIS CATHETER;  Surgeon: Renford Dills, MD;  Location: [facility] INVASIVE CV LAB;  Service: Cardiovascular;  Laterality: N/A;    Social History Social History   Tobacco Use   Smoking status: Former    Current packs/day: 0.00    Types: Cigarettes    Quit date: 1976    Years since quitting: 49.1   Smokeless tobacco: Never  Substance Use Topics   Alcohol use: No    Comment: "a beer once in awhile"   Drug use: No    Family History No family history on file.  No Known Allergies   REVIEW OF SYSTEMS (Negative unless checked)  Constitutional: [] Weight loss  [] Fever  [] Chills Cardiac: [] Chest pain   [] Chest pressure   [] Palpitations   [] Shortness of breath when laying flat   [] Shortness of breath with exertion. Vascular:  [] Pain in legs with walking   [] Pain in legs at rest  [] History of DVT   [] Phlebitis   [] Swelling in legs   [] Varicose veins   [] Non-healing ulcers Pulmonary:   [] Uses home oxygen   [] Productive cough   [] Hemoptysis   [] Wheeze  [] COPD   [] Asthma Neurologic:  [] Dizziness   [] Seizures   [] History of stroke   [] History of TIA  []   Aphasia   [] Vissual changes   [] Weakness or numbness in arm   [] Weakness or numbness in leg Musculoskeletal:   [] Joint swelling   [] Joint pain   [] Low back pain Hematologic:  [] Easy bruising  [] Easy bleeding   [] Hypercoagulable state   [] Anemic Gastrointestinal:  [] Diarrhea   [] Vomiting  [] Gastroesophageal reflux/heartburn   [] Difficulty swallowing. Genitourinary:  [x] Chronic kidney disease   [] Difficult urination  [] Frequent urination   [] Blood in urine Skin:  [] Rashes   [] Ulcers  Psychological:  [] History of anxiety   []  History of major  depression.  Physical Examination  There were no vitals filed for this visit. There is no height or weight on file to calculate BMI. Gen: WD/WN, NAD Head: [place]/AT, No temporalis wasting.  Ear/Nose/Throat: Hearing grossly intact, nares w/o erythema or drainage Eyes: PER, EOMI, sclera nonicteric.  Neck: Supple, no gross masses or lesions.  No JVD.  Pulmonary:  Good air movement, no audible wheezing, no use of accessory muscles.  Cardiac: RRR, precordium non-hyperdynamic. Vascular:   *** Vessel Right Left  Radial Palpable Palpable  Brachial Palpable Palpable  Gastrointestinal: soft, non-distended. No guarding/no peritoneal signs.  Musculoskeletal: M/S 5/5 throughout.  No deformity.  Neurologic: CN 2-12 intact. Pain and light touch intact in extremities.  Symmetrical.  Speech is fluent. Motor exam as listed above. Psychiatric: Judgment intact, Mood & affect appropriate for pt's clinical situation. Dermatologic: No rashes or ulcers noted.  No changes consistent with cellulitis.   CBC Lab Results  Component Value Date   WBC 6.3 03/19/2023   HGB 8.5 (L) 03/19/2023   HCT 28.7 (L) 03/19/2023   MCV 88.0 03/19/2023   PLT 231 03/19/2023    BMET    Component Value Date/Time   NA 140 03/20/2023 0811   K 4.7 03/20/2023 0811   CL 102 03/20/2023 0811   CO2 27 03/20/2023 0811   GLUCOSE 125 (H) 03/20/2023 0811   BUN 79 (H) 03/20/2023 0811   CREATININE 5.87 (H) 03/20/2023 0811   CALCIUM 8.4 (L) 03/20/2023 0811   GFRNONAA 10 (L) 03/20/2023 0811   CrCl cannot be calculated (Patient's most recent lab result is older than the maximum 21 days allowed.).  COAG No results found for: "INR", "PROTIME"  Radiology No results found.   Assessment/Plan There are no diagnoses linked to this encounter.   Levora Dredge, MD  06/24/2023 2:26 PM

## 2023-06-25 ENCOUNTER — Ambulatory Visit (INDEPENDENT_AMBULATORY_CARE_PROVIDER_SITE_OTHER): Payer: Medicare HMO | Admitting: Vascular Surgery

## 2023-06-25 ENCOUNTER — Encounter (INDEPENDENT_AMBULATORY_CARE_PROVIDER_SITE_OTHER): Payer: Self-pay | Admitting: Vascular Surgery

## 2023-06-25 VITALS — BP 164/101 | HR 83 | Resp 18 | Ht 67.0 in | Wt 185.8 lb

## 2023-06-25 DIAGNOSIS — N186 End stage renal disease: Secondary | ICD-10-CM | POA: Diagnosis not present

## 2023-06-25 DIAGNOSIS — Z992 Dependence on renal dialysis: Secondary | ICD-10-CM | POA: Diagnosis not present

## 2023-06-25 DIAGNOSIS — Z794 Long term (current) use of insulin: Secondary | ICD-10-CM

## 2023-06-25 DIAGNOSIS — E1122 Type 2 diabetes mellitus with diabetic chronic kidney disease: Secondary | ICD-10-CM

## 2023-06-25 DIAGNOSIS — I10 Essential (primary) hypertension: Secondary | ICD-10-CM | POA: Diagnosis not present

## 2023-06-26 ENCOUNTER — Encounter (INDEPENDENT_AMBULATORY_CARE_PROVIDER_SITE_OTHER): Payer: Self-pay | Admitting: Vascular Surgery

## 2023-07-16 ENCOUNTER — Telehealth (INDEPENDENT_AMBULATORY_CARE_PROVIDER_SITE_OTHER): Payer: Self-pay

## 2023-07-16 NOTE — Telephone Encounter (Signed)
 I attempted to contact the patient to give information regarding his left brachialcephalic fistula surgery with Dr. Gilda Crease. A message was left for a return call. This information has been sent to his dialysis center [facility].

## 2023-07-16 NOTE — Telephone Encounter (Signed)
 Spoke with Sheralyn Boatman from [employer] on 07/13/23. Regarding the patient being scheduled for surgery. Pulled the encounter form and the patient is to be scheduled for a left brachialcephalic  fistula with Dr. Gilda Crease. Patient is scheduled on 07/25/23 for surgery at [facility]. Pre-op is on 07/20/23 at 11:00 am at the [facility]. Pre-surgical instructions will be faxed to [facility] attn Sheralyn Boatman or [place].

## 2023-07-19 ENCOUNTER — Other Ambulatory Visit (INDEPENDENT_AMBULATORY_CARE_PROVIDER_SITE_OTHER): Payer: Self-pay | Admitting: Nurse Practitioner

## 2023-07-19 DIAGNOSIS — N186 End stage renal disease: Secondary | ICD-10-CM

## 2023-07-20 ENCOUNTER — Telehealth: Payer: Self-pay

## 2023-07-20 ENCOUNTER — Encounter
Admission: RE | Admit: 2023-07-20 | Discharge: 2023-07-20 | Disposition: A | Discharge status: Home / Self Care | Payer: Medicare HMO | Source: Ambulatory Visit | Attending: Vascular Surgery | Admitting: Vascular Surgery

## 2023-07-20 ENCOUNTER — Encounter: Payer: Self-pay | Admitting: Urgent Care

## 2023-07-20 ENCOUNTER — Other Ambulatory Visit: Payer: Self-pay

## 2023-07-20 ENCOUNTER — Telehealth: Payer: Self-pay | Admitting: *Deleted

## 2023-07-20 VITALS — BP 155/91 | HR 80 | Resp 16 | Ht 67.0 in | Wt 184.3 lb

## 2023-07-20 DIAGNOSIS — Z794 Long term (current) use of insulin: Secondary | ICD-10-CM

## 2023-07-20 DIAGNOSIS — Z992 Dependence on renal dialysis: Secondary | ICD-10-CM | POA: Insufficient documentation

## 2023-07-20 DIAGNOSIS — Z01818 Encounter for other preprocedural examination: Secondary | ICD-10-CM | POA: Insufficient documentation

## 2023-07-20 DIAGNOSIS — N186 End stage renal disease: Secondary | ICD-10-CM | POA: Diagnosis not present

## 2023-07-20 DIAGNOSIS — I4892 Unspecified atrial flutter: Secondary | ICD-10-CM | POA: Diagnosis not present

## 2023-07-20 DIAGNOSIS — Z01810 Encounter for preprocedural cardiovascular examination: Secondary | ICD-10-CM | POA: Diagnosis not present

## 2023-07-20 HISTORY — DX: Cardiac murmur, unspecified: R01.1

## 2023-07-20 HISTORY — DX: Other specified postprocedural states: R11.2

## 2023-07-20 HISTORY — DX: Other ventricular tachycardia: I47.29

## 2023-07-20 HISTORY — DX: Nonrheumatic mitral (valve) insufficiency: I34.0

## 2023-07-20 HISTORY — DX: End stage renal disease: N18.6

## 2023-07-20 HISTORY — DX: Anemia, unspecified: D64.9

## 2023-07-20 HISTORY — DX: Dyspnea, unspecified: R06.00

## 2023-07-20 HISTORY — DX: Other specified postprocedural states: Z98.890

## 2023-07-20 HISTORY — DX: Unspecified osteoarthritis, unspecified site: M19.90

## 2023-07-20 HISTORY — DX: Depression, unspecified: F32.A

## 2023-07-20 HISTORY — DX: Cerebral infarction, unspecified: I63.9

## 2023-07-20 LAB — CBC WITH DIFFERENTIAL/PLATELET
Abs Immature Granulocytes: 0.05 10*3/uL (ref 0.00–0.07)
Basophils Absolute: 0 10*3/uL (ref 0.0–0.1)
Basophils Relative: 1 %
Eosinophils Absolute: 0.3 10*3/uL (ref 0.0–0.5)
Eosinophils Relative: 4 %
HCT: 37.4 % — ABNORMAL LOW (ref 39.0–52.0)
Hemoglobin: 11.6 g/dL — ABNORMAL LOW (ref 13.0–17.0)
Immature Granulocytes: 1 %
Lymphocytes Relative: 18 %
Lymphs Abs: 1.2 10*3/uL (ref 0.7–4.0)
MCH: 28.2 pg (ref 26.0–34.0)
MCHC: 31 g/dL (ref 30.0–36.0)
MCV: 90.8 fL (ref 80.0–100.0)
Monocytes Absolute: 0.9 10*3/uL (ref 0.1–1.0)
Monocytes Relative: 13 %
Neutro Abs: 4.5 10*3/uL (ref 1.7–7.7)
Neutrophils Relative %: 63 %
Platelets: 176 10*3/uL (ref 150–400)
RBC: 4.12 MIL/uL — ABNORMAL LOW (ref 4.22–5.81)
RDW: 17.2 % — ABNORMAL HIGH (ref 11.5–15.5)
WBC: 7 10*3/uL (ref 4.0–10.5)
nRBC: 0 % (ref 0.0–0.2)

## 2023-07-20 LAB — TYPE AND SCREEN
ABO/RH(D): A POS
Antibody Screen: NEGATIVE

## 2023-07-20 LAB — BASIC METABOLIC PANEL
Anion gap: 13 (ref 5–15)
BUN: 72 mg/dL — ABNORMAL HIGH (ref 8–23)
CO2: 22 mmol/L (ref 22–32)
Calcium: 8.3 mg/dL — ABNORMAL LOW (ref 8.9–10.3)
Chloride: 104 mmol/L (ref 98–111)
Creatinine, Ser: 7.2 mg/dL — ABNORMAL HIGH (ref 0.61–1.24)
GFR, Estimated: 8 mL/min — ABNORMAL LOW (ref >60)
Glucose, Bld: 358 mg/dL — ABNORMAL HIGH (ref 70–99)
Potassium: 4.7 mmol/L (ref 3.5–5.1)
Sodium: 139 mmol/L (ref 135–145)

## 2023-07-20 NOTE — Telephone Encounter (Signed)
 I called requesting office to make them aware patient has been seeing [facility] cardiology office was closed left a detailed message

## 2023-07-20 NOTE — Telephone Encounter (Signed)
 Verlee Monte, NP  P Cv Div Preop Callback Request for pre-operative cardiac clearance:   LAST APPT:  07/04/2021 NEXT APPT:  NONE AS LOOKS LIKE PT HAS BEEN SEEING [facility].  I HAVE LET THE REQUESTING KNOW THIS AS WELL  1. What type of surgery is being performed? ARTERIOVENOUS (AV) FISTULA CREATION (BRACHIALCEPHALIC)  2. When is this surgery scheduled? 07/25/2023   3. Type of clearance being requested (medical, pharmacy, both)? MEDICAL   4. Are there any medications that need to be held prior to surgery? None - will be continuing his daily low dose ASA throughout his perioperative course per vascular surgery directives.  5. Practice name and name of physician performing surgery? Performing surgeon: Dr. Levora Dredge, MD Requesting clearance: [name], FNP-C     6. Anesthesia type (none, local, MAC, general)? GENERAL  7. What is the office phone and fax number?   Phone: [phone] Fax: [phone]

## 2023-07-20 NOTE — Telephone Encounter (Signed)
-----   Message from Verlee Monte sent at 07/20/2023 12:18 PM EST ----- Regarding: Request for pre-operative cardiac clearance Request for pre-operative cardiac clearance:  1. What type of surgery is being performed?  ARTERIOVENOUS (AV) FISTULA CREATION (BRACHIALCEPHALIC)   2. When is this surgery scheduled?  07/25/2023  3. Type of clearance being requested (medical, pharmacy, both)? MEDICAL   4. Are there any medications that need to be held prior to surgery? None - will be continuing his daily low dose ASA throughout his perioperative course per vascular surgery directives.   5. Practice name and name of physician performing surgery?  Performing surgeon: Dr. Levora Dredge, MD Requesting clearance: [name], FNP-C    6. Anesthesia type (none, local, MAC, general)? GENERAL  7. What is the office phone and fax number?   Phone: [phone] Fax: [phone]  ATTENTION: Unable to create telephone message as per your standard workflow. Directed by [facility] providers to send requests for cardiac clearance to this pool for appropriate distribution to provider covering pre-operative clearances.   [name], MSN, APRN, FNP-C, CEN [facility]  Peri-operative Services Nurse Practitioner Phone: [phone] 07/20/23 12:18 PM

## 2023-07-20 NOTE — Patient Instructions (Addendum)
 Your procedure is scheduled on: 07/25/23 - Wednesday Report to the Registration Desk on the 1st floor of the [facility]. To find out your arrival time, please call [phone] between 1PM - 3PM on: 07/24/23 - Tuesday If your arrival time is 6:00 am, do not arrive before that time as the [facility] entrance doors do not open until 6:00 am.  REMEMBER: Instructions that are not followed completely may result in serious medical risk, up to and including death; or upon the discretion of your surgeon and anesthesiologist your surgery may need to be rescheduled.  Do not eat food or drink any liquids after midnight the night before surgery.  No gum chewing or hard candies.   One week prior to surgery: Stop Anti-inflammatories (NSAIDS) such as Advil, Aleve, Ibuprofen, Motrin, Naproxen, Naprosyn and Aspirin based products such as Excedrin, Goody's Powder, BC Powder. You may take Tylenol if needed for pain up until the day of surgery.  Stop ANY OVER THE COUNTER supplements until after surgery.   Hold insulin aspart (NOVOLOG) on the morning of surgery.  ON THE DAY OF SURGERY ONLY TAKE THESE MEDICATIONS WITH SIPS OF WATER:  amLODipine (NORVASC)  metoprolol tartrate (LOPRESSOR)    No Alcohol for 24 hours before or after surgery.  No Smoking including e-cigarettes for 24 hours before surgery.  No chewable tobacco products for at least 6 hours before surgery.  No nicotine patches on the day of surgery.  Do not use any "recreational" drugs for at least a week (preferably 2 weeks) before your surgery.  Please be advised that the combination of cocaine and anesthesia may have negative outcomes, up to and including death. If you test positive for cocaine, your surgery will be cancelled.  On the morning of surgery brush your teeth with toothpaste and water, you may rinse your mouth with mouthwash if you wish. Do not swallow any toothpaste or mouthwash.  Use CHG Soap or wipes as directed on  instruction sheet.  Do not wear jewelry, make-up, hairpins, clips or nail polish.  For welded (permanent) jewelry: bracelets, anklets, waist bands, etc.  Please have this removed prior to surgery.  If it is not removed, there is a chance that hospital personnel will need to cut it off on the day of surgery.  Do not wear lotions, powders, or perfumes.   Do not shave body hair from the neck down 48 hours before surgery.  Contact lenses, hearing aids and dentures may not be worn into surgery.  Do not bring valuables to the hospital. [facility] is not responsible for any missing/lost belongings or valuables.   Notify your doctor if there is any change in your medical condition (cold, fever, infection).  Wear comfortable clothing (specific to your surgery type) to the hospital.  After surgery, you can help prevent lung complications by doing breathing exercises.  Take deep breaths and cough every 1-2 hours. Your doctor may order a device called an Incentive Spirometer to help you take deep breaths. When coughing or sneezing, hold a pillow firmly against your incision with both hands. This is called "splinting." Doing this helps protect your incision. It also decreases belly discomfort.  If you are being admitted to the hospital overnight, leave your suitcase in the car. After surgery it may be brought to your room.  In case of increased patient census, it may be necessary for you, the patient, to continue your postoperative care in the Same Day Surgery department.  If you are being  discharged the day of surgery, you will not be allowed to drive home. You will need a responsible individual to drive you home and stay with you for 24 hours after surgery.   If you are taking public transportation, you will need to have a responsible individual with you.  Please call the Pre-admissions Testing Dept. at [phone] if you have any questions about these instructions.  Surgery Visitation  Policy:  Patients having surgery or a procedure may have two visitors.  Children under the age of 91 must have an adult with them who is not the patient.  Temporary Visitor Restrictions Due to increasing cases of flu, RSV and COVID-19: Children ages 31 and under will not be able to visit patients in [facility] hospitals under most circumstances.  Inpatient Visitation:    Visiting hours are 7 a.m. to 8 p.m. Up to four visitors are allowed at one time in a patient room. The visitors may rotate out with other people during the day.  One visitor age 45 or older may stay with the patient overnight and must be in the room by 8 p.m.    Preparing for Surgery with CHLORHEXIDINE GLUCONATE (CHG) Soap  Chlorhexidine Gluconate (CHG) Soap  o An antiseptic cleaner that kills germs and bonds with the skin to continue killing germs even after washing  o Used for showering the night before surgery and morning of surgery  Before surgery, you can play an important role by reducing the number of germs on your skin.  CHG (Chlorhexidine gluconate) soap is an antiseptic cleanser which kills germs and bonds with the skin to continue killing germs even after washing.  Please do not use if you have an allergy to CHG or antibacterial soaps. If your skin becomes reddened/irritated stop using the CHG.  1. Shower the NIGHT BEFORE SURGERY and the MORNING OF SURGERY with CHG soap.  2. If you choose to wash your hair, wash your hair first as usual with your normal shampoo.  3. After shampooing, rinse your hair and body thoroughly to remove the shampoo.  4. Use CHG as you would any other liquid soap. You can apply CHG directly to the skin and wash gently with a scrungie or a clean washcloth.  5. Apply the CHG soap to your body only from the neck down. Do not use on open wounds or open sores. Avoid contact with your eyes, ears, mouth, and genitals (private parts). Wash face and genitals (private parts) with your  normal soap.  6. Wash thoroughly, paying special attention to the area where your surgery will be performed.  7. Thoroughly rinse your body with warm water.  8. Do not shower/wash with your normal soap after using and rinsing off the CHG soap.  9. Pat yourself dry with a clean towel.  10. Wear clean pajamas to bed the night before surgery.  12. Place clean sheets on your bed the night of your first shower and do not sleep with pets.  13. Shower again with the CHG soap on the day of surgery prior to arriving at the hospital.  14. Do not apply any deodorants/lotions/powders.  15. Please wear clean clothes to the hospital.

## 2023-07-23 ENCOUNTER — Telehealth: Payer: Self-pay | Admitting: *Deleted

## 2023-07-23 ENCOUNTER — Encounter: Payer: Self-pay | Admitting: Vascular Surgery

## 2023-07-23 NOTE — Telephone Encounter (Signed)
 Callback team,  Patient is currently established with [facility] cardiology and clearance should come from current established provider office.  He is currently followed by Dr. Corky Sing and was last seen on 06/18/2023.  Thanks,

## 2023-07-23 NOTE — Telephone Encounter (Signed)
 I will update the surgeon office.

## 2023-07-23 NOTE — Progress Notes (Signed)
 Perioperative Services Pre-Admission/Anesthesia Testing    Date: 07/23/23  Name: Victor Hernandez MRN:   [id number]  Re: Plans for surgery  Planned Surgical Procedure(s):    Case: [id number] Date/Time: 07/25/23 1016   Procedure: ARTERIOVENOUS (AV) FISTULA CREATION (BRACHIALCEPHALIC) (Left)   Anesthesia type: General   Pre-op diagnosis: ESRD   Location: [facility] OR ROOM 08 / [facility] ORS FOR ANESTHESIA GROUP   Surgeons: Renford Dills, MD      Clinical Notes:  Patient is scheduled for the above procedure on 07/25/2023 with Dr. Levora Dredge, MD.  Given patient's past medical history significant for cardiovascular diagnoses, patient was referred for preanesthesia consult. Extensive medical record review conducted. Patient's PMH updated in [facility]. Patient noted to have history of CAF, PAF, HFpEF, severe mitral valve regurgitation, severe pulmonary hypertension, cardiac murmur, cardiomegaly, HTN, HLD, T2DM, DOE, ESRD on HD.   Patient is under the care of outpatient cardiology Corky Sing, MD) at [facility]. Patient was last seen in the cardiology clinic on 06/22/2023; notes reviewed. At the time of his clinic visit, patient was overall stable from a cardiovascular perspective. He complained of chronic exertional dyspnea that was overall stable and at baseline. No chest pain, PND, orthopnea, palpitations, significant peripheral edema, vertiginous symptoms, or presyncope/syncope. Recent diagnostic studies were discussed.   Most recent TTE performed on 06/06/2023 revealed a low normal left ventricular systolic function with an EF of 50%. There was moderate LVH.  LV myocardium displayed a speckled pattern. Left ventricular diastolic Doppler parameters consistent with pseudonormalization (G2DD). There was posterior and lateral wall hypokinesis. Left atrium was severely enlarged. Right ventricle was enlarged with mild systolic dysfunction. RVSP severely elevated at 85 mmHg consistent with known severe  pulmonary hypertension. There was severe eccentric mitral valve regurgitation. Additionally, there was mild to moderate tricuspid and trivial pulmonary valve regurgitation. All transvalvular gradients were noted to be normal providing no evidence suggestive of valvular stenosis. Aorta normal in size with no evidence of ectasia or aneurysmal dilatation. TEE was recommended for further evaluation of findings.   Most recent myocardial perfusion imaging study was performed on 06/06/2023 revealing a mildly reduced left ventricular systolic function with an EF of 41%. There was hypokinesis of the lateral wall noted. SPECT images demonstrated a medium size, moderate intensity, fixed defect noted in basal to mid inferolateral/anterolateral wall and basal inferior wall with associated hypokinesis suggestive of prior infarction in left circumflex territory. No was no evidence of significant reversible ischemia. No significant TID. Overall, study determined to be intermediate risk.   At this point, cardiology recommending a RIGHT/LEFT heart catheterization, in addition to the aforementioned TEE to further assess the findings noted on non-invasive cardiovascular testing. When patient was in the office, he left advising that he wished to "think about it" prior to proceeding. Patient to follow up with outpatient cardiology in 3 months or sooner if needed.   Impression and Plan:  Victor Hernandez is pending procedure with vascular surgery to establish durable access site for hemodialysis. Patient is currently on hemodialysis, which he is receiving via a catheter. Of note, patient has had access sites in both of his upper extremities, however following multiple attempts to establish viable assess, patient does not have a functioning long term access site for continued hemodialysis.   All of the above information was discussed with anesthesia attending Suzan Slick, MD). Discussed concerns related to severe pulmonary hypertension  diagnosis in the setting of anesthetic care. Patient will require care in a tertiary care center that is  equipped with the capabilities of providing invasive hemodynamic monitoring and specialized anesthetic care. Cardiology has recommended a TEE, therefore at the minimum, anesthesia team is recommending that care be transferred to a center that is equipped with access to intraoperative TEE. Patient's severe eccentric mitral valve disease felt to be contributory, at least to some degree, to his severe pulmonary hypertension. Additionally, RIGHT/LEFT heart catheterization has been recommended to further assess pulmonary pressures and to further evaluate this patient's coronary anatomy and degree of presumed coronary artery disease. Known severe pulmonary hypertension places him at significantly increased risks of potentially fatal complications that cannot be mitigated in a setting not equipped to provide specialized anesthetic care.   Communication regarding conversation being forwarded to surgeon for review and consideration. If surgeon has further information that needs to be discussed and/or considered, anesthesia attending is available for consult. Again, this is not a decision that is made in haste. The anesthesia team here at [facility] fully appreciates the fact that the planned surgical intervention is necessary in order for the patient to pursue life sustaining treatment via continued hemodialysis. With that said, the patient's cardiovascular/cardiopulmonary conditions place him at a risk stratification (HIGH) that requires specialized/advanced level anesthetic care that our facility is not equipped to safely provide. Therefore, per recommendations of the anesthesia physicians here at our facility, it is in the best interest of the patient that his care be transferred to a tertiary setting in order to have this procedure performed.   [name], MSN, APRN, FNP-C,  CEN [facility]  Perioperative Services Nurse Practitioner Phone: [phone] Fax: [phone] 07/23/23 10:15 PM  NOTE: This note has been prepared using Dragon dictation software. Despite my best ability to proofread, there is always the potential that unintentional transcriptional errors may still occur from this process.

## 2023-07-23 NOTE — Telephone Encounter (Signed)
-----   Message from Verlee Monte sent at 07/20/2023 12:18 PM EST ----- Regarding: Request for pre-operative cardiac clearance Request for pre-operative cardiac clearance:  1. What type of surgery is being performed?  ARTERIOVENOUS (AV) FISTULA CREATION (BRACHIALCEPHALIC)   2. When is this surgery scheduled?  07/25/2023  3. Type of clearance being requested (medical, pharmacy, both)? MEDICAL   4. Are there any medications that need to be held prior to surgery? None - will be continuing his daily low dose ASA throughout his perioperative course per vascular surgery directives.   5. Practice name and name of physician performing surgery?  Performing surgeon: Dr. Levora Dredge, MD Requesting clearance: [name], FNP-C    6. Anesthesia type (none, local, MAC, general)? GENERAL  7. What is the office phone and fax number?   Phone: [phone] Fax: [phone]  ATTENTION: Unable to create telephone message as per your standard workflow. Directed by [facility] providers to send requests for cardiac clearance to this pool for appropriate distribution to provider covering pre-operative clearances.   [name], MSN, APRN, FNP-C, CEN [facility]  Peri-operative Services Nurse Practitioner Phone: [phone] 07/20/23 12:18 PM

## 2023-07-24 ENCOUNTER — Telehealth (INDEPENDENT_AMBULATORY_CARE_PROVIDER_SITE_OTHER): Payer: Self-pay

## 2023-07-24 NOTE — Telephone Encounter (Signed)
 Per the information given to me from anesthesia and Sheppard Plumber NP. I spoke with the patient and let him know that due to his cardiac history and the extensiveness of such he has been recommended to have his surgery at a larger hospital, such as [facility] or [facility]. Patient was understandably upset and had questions as to the why of this information. I advised that as I was told is what I could tell him. I also let him know that a referral would be sent to said hospital and they would be contacting him to get him scheduled.

## 2023-07-24 NOTE — Telephone Encounter (Signed)
 I attempted to contact the patient to let them know about the surgery being canceled at this time. I attempted to contact through the patient's mobile and it was full. I then left a message on the home number for a return call.

## 2023-07-25 ENCOUNTER — Telehealth: Payer: Self-pay

## 2023-07-25 ENCOUNTER — Encounter: Admission: RE | Payer: Self-pay | Source: Home / Self Care

## 2023-07-25 ENCOUNTER — Ambulatory Visit: Admission: RE | Admit: 2023-07-25 | Payer: Medicare HMO | Source: Home / Self Care | Admitting: Vascular Surgery

## 2023-07-25 HISTORY — DX: Unspecified diastolic (congestive) heart failure: I50.30

## 2023-07-25 HISTORY — DX: Type 2 diabetes mellitus without complications: E11.9

## 2023-07-25 HISTORY — DX: Dependence on renal dialysis: Z99.2

## 2023-07-25 HISTORY — DX: Paroxysmal atrial fibrillation: I48.0

## 2023-07-25 HISTORY — DX: Atherosclerotic heart disease of native coronary artery without angina pectoris: I25.10

## 2023-07-25 HISTORY — DX: Cardiomegaly: I51.7

## 2023-07-25 HISTORY — DX: Anemia in chronic kidney disease: D63.1

## 2023-07-25 HISTORY — DX: Nontraumatic intracerebral hemorrhage, unspecified: I61.9

## 2023-07-25 HISTORY — DX: End stage renal disease: N18.6

## 2023-07-25 HISTORY — DX: Other forms of dyspnea: R06.09

## 2023-07-25 HISTORY — DX: Insomnia, unspecified: G47.00

## 2023-07-25 HISTORY — DX: Long term (current) use of aspirin: Z79.82

## 2023-07-25 HISTORY — DX: Other cerebrovascular disease: I67.89

## 2023-07-25 HISTORY — DX: Pulmonary hypertension, unspecified: I27.20

## 2023-07-25 HISTORY — DX: Chronic kidney disease, unspecified: N18.9

## 2023-07-25 SURGERY — ARTERIOVENOUS (AV) FISTULA CREATION
Anesthesia: General | Laterality: Left

## 2023-07-25 NOTE — Telephone Encounter (Signed)
 Attempted to call for surgery scheduling. LVM

## 2023-07-26 ENCOUNTER — Other Ambulatory Visit: Payer: Self-pay

## 2023-07-26 DIAGNOSIS — N186 End stage renal disease: Secondary | ICD-10-CM

## 2023-07-30 ENCOUNTER — Telehealth: Payer: Self-pay

## 2023-07-30 NOTE — Telephone Encounter (Signed)
 Left message re: time change for surgery on 3/7.  Patient to report to hospital at 5:30 am.

## 2023-08-02 ENCOUNTER — Telehealth: Payer: Self-pay

## 2023-08-02 MED ORDER — FENTANYL CITRATE (PF) 100 MCG/2ML IJ SOLN
INTRAMUSCULAR | Status: AC
  Filled 2023-08-02: qty 2

## 2023-08-02 MED ORDER — AMISULPRIDE (ANTIEMETIC) 5 MG/2ML IV SOLN
INTRAVENOUS | Status: AC
  Filled 2023-08-02: qty 2

## 2023-08-02 NOTE — Telephone Encounter (Signed)
 Called to inform patient that his surgery for 08/03/23 has been cancelled due to a need of cardiac clearance.  Left voice mail.  Dr Myra Gianotti aware of cancellation.

## 2023-08-02 NOTE — Anesthesia Preprocedure Evaluation (Signed)
 Anesthesia Evaluation    Airway        Dental   Pulmonary former smoker          Cardiovascular hypertension,      Neuro/Psych    GI/Hepatic   Endo/Other  diabetes    Renal/GU      Musculoskeletal   Abdominal   Peds  Hematology   Anesthesia Other Findings   Reproductive/Obstetrics                             Anesthesia Physical Anesthesia Plan  ASA:   Anesthesia Plan:    Post-op Pain Management:    Induction:   PONV Risk Score and Plan:   Airway Management Planned:   Additional Equipment:   Intra-op Plan:   Post-operative Plan:   Informed Consent:   Plan Discussed with:   Anesthesia Plan Comments: (PAT note written 08/02/2023 by Shonna Chock, PA-C.  )       Anesthesia Quick Evaluation

## 2023-08-02 NOTE — Progress Notes (Signed)
 Anesthesia Chart Review: Victor Hernandez  Case: [id number] Date/Time: 08/03/23 0715   Procedure: ARTERIOVENOUS (AV) LEFT ARM FISTULA CREATION (Left)   Anesthesia type: Choice   Pre-op diagnosis: ESRD   [facility]: [facility] ROOM 11 / [facility]   Surgeons: Victor Libman, MD       DISCUSSION: Patient is a 68 year old male scheduled for the above procedure. Surgery was initially scheduled at [facility] with Dr. Levora Hernandez, but due to his extensive cardiac history (including suspected CAD, severe MR, severe pulmonary HTN, AF) their anesthesia group recommended moving case to a tertiary center that had capabilities of providing invasive hemodynamic monitoring and access to intraoperative TEE if needed. (See below for additional details.)  History includes forme smoker (quit 05/29/74), post-operative N/V, HTN, HLD, CAD (coronary calcifications on CT 05/2020, 05/2023 stress test suggestive of prior infarct LCX territory, no ischemia), CHF (diagnosed 06/2020), murmur (severe mitral regurgitation 05/2023), severe pulmonary hypertension (RVSP 85 mmHg 05/2023 TTE), NSVT (02/2023), afib (initially noted 02/20/23), DM2, exertional dyspnea, ESRD (on HD since 03/14/23), anemia, chronic microhemorrhage in right parietal lobe (in setting of hypertensive crisis/4/22).  He has intermittently been evaluated through cardiologists with [facility] during admission in 06/2020 and 02/2023. He has had known moderate to severe MR dating back to 2022. Echo in 02/2023 also showed severe pulmonary hypertension with RVSP 85 mmHg (previously 74.3 mmHg). He had 2 runs of NSVT lasting up to 6 beats during 02/2023 admission, and metoprolol increased. Hemodialysis initiated that admission for acute on chronic CKD V with acute on chronic diastolic CHF. Out-patient cardiology follow-up recommended. Since then, he got established at [facility] with Dr. Windell Hernandez. Initial visit on 05/11/23. Primary symptom was exertional dyspnea. An echo and  nuclear stress test were ordered. He also recommended starting Eliquis for rate controlled afib. (Eliquis is not currently on his medication list.)  Most recent TTE performed on 06/06/2023 revealed a low normal left ventricular systolic function with an EF of 50%. There was moderate LVH.  LV myocardium displayed a speckled pattern. Left ventricular diastolic Doppler parameters consistent with pseudonormalization (G2DD). There was posterior and lateral wall hypokinesis. Left atrium was severely enlarged. Right ventricle was enlarged with mild systolic dysfunction. RVSP severely elevated at 85 mmHg consistent with known severe pulmonary hypertension. There was severe eccentric mitral valve regurgitation. Additionally, there was mild to moderate tricuspid and trivial pulmonary valve regurgitation. All transvalvular gradients were noted to be normal providing no evidence suggestive of valvular stenosis. Aorta normal in size with no evidence of ectasia or aneurysmal dilatation. TEE was recommended for further evaluation of findings.    Most recent myocardial perfusion imaging study was performed on 06/06/2023 revealing a mildly reduced left ventricular systolic function with an EF of 41%. There was hypokinesis of the lateral wall noted. SPECT images demonstrated a medium size, moderate intensity, fixed defect noted in basal to mid inferolateral/anterolateral wall and basal inferior wall with associated hypokinesis suggestive of prior infarction in left circumflex territory. No was no evidence of significant reversible ischemia. No significant TID. Overall, study determined to be intermediate risk.    At 06/22/23 follow-up with Dr. Corky Hernandez, he recommended proceeding with a TEE to further assess the mitral valve, as well as a RIGHT/LEFT heart catheterization to further assess pulmonary pressures and to further evaluate patient's coronary anatomy and degree of presumed coronary artery disease. Mr. Victor Hernandez wanted more time  to think about it before scheduling. He was advised to scheduled follow-up with cardiology in 3 months  or sooner if needed.   He is currently receiving hemodialysis via a right internal jugular tunneled dialysis catheter which was placed on 03/19/23. Long term hemodialysis access desired.   Eliquis is not on his current medication list. He is on Lopressor 50 mg BID and ASA 81 mg daily.   A1c 8.5% on 03/13/23. By current medication list, he is on Novolog SSI.   Reviewed above with anesthesiologist Victor Aran, MD. As above, patient with recent progression from moderate-severe to severe MR. RVSP further elevated since 02/2023. Notes suggest DOE has been his primary symptom. Although recent stress test suggestive of prior MI, there was no significant ischemia. He may ultimately need MV intervention, but currently had a TDC in place and would anticipate this would need to be removed first. However, with pending cardiac testing would advise preoperative cardiology input/clarification timing of testing. Victor Hernandez at [facility] aware. I also spoke with Dr. Myra Hernandez. He is hopeful procedure could be done with block and local anesthesia.    VS:  Wt Readings from Last 3 Encounters:  07/20/23 83.6 kg  06/25/23 84.3 kg  03/20/23 83.1 kg   BP Readings from Last 3 Encounters:  07/20/23 (!) 155/91  06/25/23 (!) 164/101  03/20/23 115/78   Pulse Readings from Last 3 Encounters:  07/20/23 80  06/25/23 83  03/20/23 60    PROVIDERS: Victor Nones, FNP-BC is PCP  Victor Norfolk, MD is cardiologist [facility]) Victor Pigeon, MD is nephrologist   LABS: Most recent labs in [facility] include: Lab Results  Component Value Date   WBC 7.0 07/20/2023   HGB 11.6 (L) 07/20/2023   HCT 37.4 (L) 07/20/2023   PLT 176 07/20/2023   GLUCOSE 358 (H) 07/20/2023   ALT 23 03/12/2023   AST 17 03/12/2023   NA 139 07/20/2023   K 4.7 07/20/2023   CL 104 07/20/2023   CREATININE 7.20 (H) 07/20/2023   BUN 72 (H)  07/20/2023   CO2 22 07/20/2023   HGBA1C 8.5 (H) 03/13/2023    IMAGES: CXR (in setting of acute on chronic CHF/renal failure) 03/12/23: IMPRESSION: 1. Stable mild cardiomegaly and moderate severity pulmonary vascular congestion. 2. Mild left basilar atelectasis.   EKG: EKG 07/20/23: Atrial flutter with variable A-V block Minimal voltage criteria for LVH, may be normal variant ( Cornell product ) Nonspecific ST and T wave abnormality Abnormal ECG When compared with ECG of 20-Feb-2023 14:01, Atrial flutter has replaced Atrial fibrillation Nonspecific T wave abnormality no longer evident in Inferior leads Confirmed by Julien Nordmann [phone]) on 07/27/2023 12:49:50 PM  EKG 05/11/23: Per narrative in [facility]: Atrial fibrillation at 76 bpm  Nonspecific T wave abnormalities  Abnormal ECG  No previous ECGs available  I reviewed and concur with this report. Electronically signed UE:[email or web address], Geradine Girt [id number]) on 06/07/2023 2:25:52 PM    CV: Nuclear stress test 06/16/23 ([facility]): Pharmacological stress test is abnormal.  SPECT images demonstrate medium size, moderate intensity, fixed defect in  basal to mid inferolateral/anterolateral wall and basal inferior wall with  associated hypokinesis suggestive of prior infarction in left circumflex  territory.  No ischemia, sum difference score 0.  Left ventricle normal in size with reduced LVEF of 41%.  No significant  TID.  Intermediate risk study.    Echo 06/06/23 ([facility]): CONCLUSION: Mild left ventricular systolic dysfunction with moderate LVH.  Estimated EF 50%.  Elevated LA pressures with diastolic dysfunction (Grade 2). Mild right ventricular systolic dysfunction Valvular regurgitation: Trivial AR, Severe MR, Trivial PR,  Mild TR No valvular stenosis. PHYSICIAN IMPRESSIONS: LV myocardium has speckled pattern. Mildly reduced LVEF with wall motion abnormality as below. Severe eccentric mitral regurgitation. Recommend TEE for  further evaluation. Mild to moderate tricuspid regurgitation. Severe pulmonary hypertension, RVSP 85 mmHg. - Comparison echo 03/13/23: LVEF 45-50%, global LV hypokinesis, mildly dilated LV cavity, moderate LVH, grade III DD, elevated LA pressure, abnormal LV global longitudinal strain of -14.4%, normal RVSF, mild RVH, severely elevated PASO, RVSP 74.3 mmHg, severely dilated LA, small pericardial effusion, moderate-severe (may be underestimated) MR, mild-moderate TR, mild MR; 06/01/20: LVEF 50-55%, no RWMA, moderate LVH, moderate-severe MR.    Past Medical History:  Diagnosis Date   (HFpEF) heart failure with preserved ejection fraction (HCC)    a.) TTE 03/13/2023: EF 45-50%, glob HK, mild LV dil, mod LVH, G3DD, GLS -14.4%, RVSP 74.3, mod-sev MR, mild-mod TR, AoV sclerosis without stenosis; b.) TTE 06/06/2023: EF 50%, mod LVH, G2DD, severe LAE, RAE/RVE,  mildly reduced RVSF, RVSP 85, triv AR/PR, sev MR   Anemia of chronic renal failure    Arthritis    CAD (coronary artery disease)    a.) CT chest 05/31/2020: extensive CAD; b.) MV 06/05/2022: EF 41%, mod sized fixed defect in basal to mid interolat/anterolat and basal inf wall associated with hypokinesis suggestive of prior infarct in LCx territory   Cardiomegaly    Cerebral hemorrhage, nontraumatic (HCC)    a.) brain MRI 06/01/2020: chronic microhemorrhage in the right parietal lobe   Cerebral microvascular disease    Depression    DOE (dyspnea on exertion)    ESRD (end stage renal disease) on dialysis (HCC)    Heart murmur    Hyperlipidemia    Hypertension    Insomnia    a.) takes melatonin PRN   Long-term use of aspirin therapy    Moderate to severe mitral regurgitation 06/01/2020   a.) TTE 06/01/2020: mod-severe MR; b.) TTE 03/13/2023: mod-severe MR; c.) TTE 06/06/2023: severe MR   NSVT (nonsustained ventricular tachycardia) (HCC)    PAF (paroxysmal atrial fibrillation) (HCC)    PONV (postoperative nausea and vomiting)    Severe  pulmonary hypertension (HCC)    a.) TTE 03/13/2023: RVSP 74.3 mmHg; b.) TTE 06/06/2023: RSVP 85 mmHg   T2DM (type 2 diabetes mellitus) (HCC)     Past Surgical History:  Procedure Laterality Date   COLONOSCOPY     COLONOSCOPY WITH PROPOFOL N/A 05/19/2016   Procedure: COLONOSCOPY WITH PROPOFOL;  Surgeon: Kieth Brightly, MD;  Location: [facility] ENDOSCOPY;  Service: Endoscopy;  Laterality: N/A;   DIALYSIS/PERMA CATHETER INSERTION Right 03/19/2023   Procedure: DIALYSIS/PERMA CATHETER INSERTION;  Surgeon: Annice Needy, MD;  Location: [facility] INVASIVE CV LAB;  Service: Cardiovascular;  Laterality: Right;   TEMPORARY DIALYSIS CATHETER N/A 03/13/2023   Procedure: TEMPORARY DIALYSIS CATHETER;  Surgeon: Renford Dills, MD;  Location: [facility] INVASIVE CV LAB;  Service: Cardiovascular;  Laterality: N/A;    MEDICATIONS:  fentaNYL (SUBLIMAZE) 100 MCG/2ML injection    acetaminophen (TYLENOL) 325 MG tablet   amLODipine (NORVASC) 10 MG tablet   aspirin EC 81 MG tablet   blood glucose meter kit and supplies   calcitRIOL (ROCALTROL) 0.25 MCG capsule   calcium acetate (PHOSLO) 667 MG capsule   docusate sodium (COLACE) 100 MG capsule   Ferrous Sulfate (SLOW FE) 142 (45 Fe) MG TBCR   insulin aspart (NOVOLOG) 100 UNIT/ML injection   melatonin 5 MG TABS   metoprolol tartrate (LOPRESSOR) 50 MG tablet   multivitamin (RENA-VIT) TABS tablet  torsemide (DEMADEX) 100 MG tablet    Shonna Chock, PA-C Surgical Short Stay/Anesthesiology [facility] Phone [phone] [facility] Phone [phone] 08/02/2023 2:34 PM

## 2023-08-03 ENCOUNTER — Encounter (HOSPITAL_COMMUNITY): Payer: Self-pay | Admitting: Vascular Surgery

## 2023-08-03 ENCOUNTER — Encounter (HOSPITAL_COMMUNITY): Admission: RE | Payer: Self-pay | Source: Home / Self Care

## 2023-08-03 ENCOUNTER — Ambulatory Visit (HOSPITAL_COMMUNITY): Admission: RE | Admit: 2023-08-03 | Payer: Medicare HMO | Source: Home / Self Care | Admitting: Surgery

## 2023-08-03 SURGERY — ARTERIOVENOUS (AV) FISTULA CREATION
Anesthesia: Choice | Laterality: Left

## 2023-08-15 NOTE — Telephone Encounter (Signed)
 Pt is not followed by [facility]. Pt is followed by [facility]

## 2023-08-15 NOTE — Telephone Encounter (Signed)
 Caller Loraine Leriche) is following up on the status of patient's clearance.  Caller stated the procedure was previously canceled and they will need a new clearance.  Fax# [phone]

## 2023-08-16 NOTE — Telephone Encounter (Signed)
 I have sent a staff message to [name], FNP to confirm in his notes that the pt is being referred to [facility] for preop clearance.

## 2023-08-23 ENCOUNTER — Ambulatory Visit (INDEPENDENT_AMBULATORY_CARE_PROVIDER_SITE_OTHER): Admitting: Nurse Practitioner

## 2023-08-23 ENCOUNTER — Encounter: Payer: Self-pay | Admitting: Nurse Practitioner

## 2023-08-23 VITALS — BP 160/90 | HR 95 | Ht 70.0 in | Wt 184.6 lb

## 2023-08-23 DIAGNOSIS — I5033 Acute on chronic diastolic (congestive) heart failure: Secondary | ICD-10-CM

## 2023-08-23 NOTE — Progress Notes (Unsigned)
 Office Visit    Patient Name: Victor Hernandez Date of Encounter: 08/23/2023  Primary Care Provider:  Patient, No Pcp Per Primary Cardiologist:  Debbe Odea, MD  Chief Complaint    68 y.o. male w/ a h/o chronic HFmrEF, cardiomyopathy, CAD (on chest CT), ESRD on HD, HTN, HL, pulm HTN, severe MR, NSVT, DMII, anemia of chronic disease, and persistent afib on eliquis, who presents for ***  Past Medical History  Subjective   Past Medical History:  Diagnosis Date   Anemia of chronic renal failure    Arthritis    CAD (coronary artery disease)    a. CT chest 05/31/2020: extensive CAD; b. MV 06/06/2023: EF 41%, mod sized fixed defect in basal to mid interolat/anterolat and basal inf wall associated with hypokinesis suggestive of prior infarct in LCx territory. No ischemia.   Cardiomegaly    Cerebral hemorrhage, nontraumatic (HCC)    a. brain MRI 06/01/2020: chronic microhemorrhage in the right parietal lobe   Cerebral microvascular disease    Chronic Heart failure with mid-range ejection fraction (HCC)    a.TTE 03/13/2023: EF 45-50%, glob HK, mild LV dil, mod LVH, G3DD, GLS -14.4%, RVSP 74.3, mod-sev MR, mild-mod TR, AoV sclerosis without stenosis; b.TTE 06/06/2023: EF 50%, mod LVH, G2DD, severe LAE, RAE/RVE,  mildly reduced RVSF, RVSP 85, triv AR/PR, sev MR   Depression    DOE (dyspnea on exertion)    ESRD (end stage renal disease) on dialysis (HCC)    Heart murmur    Hyperlipidemia    Hypertension    Insomnia    a. takes melatonin PRN   Long-term use of aspirin therapy    NSVT (nonsustained ventricular tachycardia) (HCC)    PAF (paroxysmal atrial fibrillation) (HCC)    PONV (postoperative nausea and vomiting)    Severe mitral regurgitation    a. TTE 06/01/2020: mod-severe MR; b. TTE 03/13/2023: mod-severe MR; c. TTE 06/06/2023: severe MR   Severe pulmonary hypertension (HCC)    a. TTE 03/13/2023: RVSP 74.3 mmHg; b. TTE 06/06/2023: RSVP 85 mmHg.   T2DM (type 2 diabetes  mellitus) (HCC)    Past Surgical History:  Procedure Laterality Date   COLONOSCOPY     COLONOSCOPY WITH PROPOFOL N/A 05/19/2016   Procedure: COLONOSCOPY WITH PROPOFOL;  Surgeon: Kieth Brightly, MD;  Location: [facility] ENDOSCOPY;  Service: Endoscopy;  Laterality: N/A;   DIALYSIS/PERMA CATHETER INSERTION Right 03/19/2023   Procedure: DIALYSIS/PERMA CATHETER INSERTION;  Surgeon: Annice Needy, MD;  Location: [facility] INVASIVE CV LAB;  Service: Cardiovascular;  Laterality: Right;   TEMPORARY DIALYSIS CATHETER N/A 03/13/2023   Procedure: TEMPORARY DIALYSIS CATHETER;  Surgeon: Renford Dills, MD;  Location: [facility] INVASIVE CV LAB;  Service: Cardiovascular;  Laterality: N/A;    Allergies  No Known Allergies    History of Present Illness      68 y.o. y/o male w/ a h/o chronic HFmrEF, cardiomyopathy, CAD (on chest CT), ESRD on HD, HTN, HL, pulm HTN, severe MR, NSVT, DMII, anemia of chronic disease, and persistent afib on eliquis.  Patient initially establish care with our practice in February 2022 at which time he was admitted with volume overload.  Echo at that time showed an EF of 50-55% with moderate to severe mitral regurgitation.  Following diuresis, recommendation was made for outpatient follow-up and TEE.  In October 2024, he was admitted with worsening renal failure requiring dialysis catheter placement and initiation of hemodialysis.  Echocardiogram showed an EF of 45 to 50% with grade 3  diastolic dysfunction, RVSP 74.3 mmHg, sev dil LA, and mod-severe MR. He was seen by our team during that hospitalization due to brief runs of nonsustained VT, which were conservatively managed with beta-blocker therapy.  He subsequently established care with [facility] clinic in January 2025.  Echocardiogram in January 2025 showed an EF of 50% with grade 2 diastolic dysfunction and severe MR. Stress testing in January 2025 showed basal to mid inferolateral/anterolateral and basal inferior infarct without  ischemia.  At most recent [facility] cardiology visit on June 22, 2023, right and left heart cardiac catheterization and transesophageal echocardiogram were recommended however, patient deferred for the time being.    Objective  Home Medications    Current Outpatient Medications  Medication Sig Dispense Refill   acetaminophen (TYLENOL) 325 MG tablet Take 650 mg by mouth every 6 (six) hours as needed.     amLODipine (NORVASC) 10 MG tablet Take 1 tablet (10 mg total) by mouth daily. 30 tablet 0   aspirin EC 81 MG tablet Take 1 tablet (81 mg total) by mouth daily. 30 tablet 11   blood glucose meter kit and supplies Dispense based on patient and insurance preference. Use up to four times daily as directed. (FOR ICD-10 E10.9, E11.9). 1 each 0   calcitRIOL (ROCALTROL) 0.25 MCG capsule Take 1 capsule (0.25 mcg total) by mouth daily. 30 capsule 0   calcium acetate (PHOSLO) 667 MG capsule Take 2 capsules (1,334 mg total) by mouth 3 (three) times daily with meals. 90 capsule 0   docusate sodium (COLACE) 100 MG capsule Take 100 mg by mouth daily as needed for mild constipation.     Ferrous Sulfate (SLOW FE) 142 (45 Fe) MG TBCR Take 1 tablet by mouth daily.     insulin aspart (NOVOLOG) 100 UNIT/ML injection Inject 4-12 Units into the skin as directed. Sliding scale 150-200 = 4 units 201-250 = 6 units 251-300 = 8 units 301-350 = 10 units 351-400 = 12 units     melatonin 5 MG TABS Take 1 tablet (5 mg total) by mouth at bedtime. (Patient taking differently: Take 5 mg by mouth at bedtime as needed.)     metoprolol tartrate (LOPRESSOR) 50 MG tablet Take 1 tablet (50 mg total) by mouth 2 (two) times daily. 30 tablet 1   multivitamin (RENA-VIT) TABS tablet Take 1 tablet by mouth at bedtime. 30 tablet 0   torsemide (DEMADEX) 100 MG tablet Take 1 tablet (100 mg total) by mouth daily. (Patient taking differently: Take 50 mg by mouth daily.) 30 tablet 1   No current facility-administered medications for this  visit.     Physical Exam    VS:  BP (!) 160/90   Pulse 95   Ht 5\' 10"  (1.778 m)   Wt 184 lb 9.6 oz (83.7 kg)   SpO2 93%   BMI 26.49 kg/m  , BMI Body mass index is 26.49 kg/m.       GEN: Well nourished, well developed, in no acute distress. HEENT: normal. Neck: Supple, no JVD, carotid bruits, or masses. Cardiac: RRR, no murmurs, rubs, or gallops. No clubbing, cyanosis, edema.  Radials 2+/PT 2+ and equal bilaterally.  Respiratory:  Respirations regular and unlabored, clear to auscultation bilaterally. GI: Soft, nontender, nondistended, BS + x 4. MS: no deformity or atrophy. Skin: warm and dry, no rash. Neuro:  Strength and sensation are intact. Psych: Normal affect.  Accessory Clinical Findings    ECG personally reviewed by me today -    *** -  no acute changes.  Lab Results  Component Value Date   WBC 7.0 07/20/2023   HGB 11.6 (L) 07/20/2023   HCT 37.4 (L) 07/20/2023   MCV 90.8 07/20/2023   PLT 176 07/20/2023   Lab Results  Component Value Date   CREATININE 7.20 (H) 07/20/2023   BUN 72 (H) 07/20/2023   NA 139 07/20/2023   K 4.7 07/20/2023   CL 104 07/20/2023   CO2 22 07/20/2023   Lab Results  Component Value Date   ALT 23 03/12/2023   AST 17 03/12/2023   ALKPHOS 117 03/12/2023   BILITOT 0.8 03/12/2023   No results found for: "CHOL", "HDL", "LDLCALC", "LDLDIRECT", "TRIG", "CHOLHDL"  Lab Results  Component Value Date   HGBA1C 8.5 (H) 03/13/2023   Lab Results  Component Value Date   TSH 2.465 03/30/2022       Assessment & Plan    1.  ***  Nicolasa Ducking, NP 08/23/2023, 2:35 PM

## 2023-08-24 ENCOUNTER — Encounter: Payer: Self-pay | Admitting: Nurse Practitioner

## 2023-08-24 ENCOUNTER — Telehealth: Payer: Self-pay

## 2023-08-24 NOTE — Telephone Encounter (Signed)
 Faxed cardiac clearance request to [facility].

## 2023-08-30 ENCOUNTER — Telehealth: Payer: Self-pay

## 2023-08-30 NOTE — Telephone Encounter (Signed)
 Request for cardiac clearance re-faxed to patient's cardiology office.

## 2023-09-04 ENCOUNTER — Other Ambulatory Visit: Payer: Self-pay

## 2023-09-04 ENCOUNTER — Telehealth: Payer: Self-pay

## 2023-09-04 DIAGNOSIS — N186 End stage renal disease: Secondary | ICD-10-CM

## 2023-09-04 NOTE — Telephone Encounter (Signed)
 Attempted to call for surgery scheduling. LVM

## 2023-09-18 ENCOUNTER — Other Ambulatory Visit: Payer: Self-pay

## 2023-09-18 ENCOUNTER — Encounter (HOSPITAL_COMMUNITY): Payer: Self-pay | Admitting: Surgery

## 2023-09-18 NOTE — Progress Notes (Signed)
 Anesthesia Chart Review: SAME DAY WORK-UP  Case: [id number] Date/Time: 09/19/23 1223   Procedures:      ARTERIOVENOUS (AV) FISTULA CREATION (Left)     INSERTION, GRAFT, ARTERIOVENOUS, UPPER EXTREMITY (Left)   Anesthesia type: General   Diagnosis: End stage renal disease (HCC) [N18.6]   Pre-op  diagnosis: ESRD   [facility]: [facility] ROOM 12 / [facility]   Surgeons: Margherita Shell, MD       DISCUSSION:  Patient is a 68 year old male scheduled for the above procedure. Surgery was initially scheduled at [facility] with Dr. Devon Fogo, but due to his extensive cardiac history (including suspected CAD, severe MR, severe pulmonary HTN, AF) their anesthesia group recommended moving case to a tertiary center that had capabilities of providing invasive hemodynamic monitoring and access to intraoperative TEE if needed. (See below for additional details.) Case was rescheduled at [facility] OR for 08/03/23, but due to potential need for future cardiac testing, surgery was delayed until he could have cardiology re-evaluation. Since then he had re-evaluation on 08/31/23 with Dr. Bob Burn at [facility] (see below and/or note in Care Everywhere).   History includes former smoker (quit 05/29/74), post-operative N/V, HTN, HLD, CAD (coronary calcifications on CT 05/2020, 05/2023 stress test suggestive of prior infarct LCX territory, no ischemia), CHF (diagnosed 06/2020), murmur (severe mitral regurgitation 05/2023), severe pulmonary hypertension (RVSP 85 mmHg 05/2023 TTE), NSVT (02/2023), afib (initially noted 02/20/23), DM2, exertional dyspnea, ESRD (on HD since 03/14/23), anemia, chronic microhemorrhage in right parietal lobe (in setting of hypertensive crisis/4/22).   He has intermittently been evaluated through cardiologists with [facility] during admission in 06/2020 and 02/2023. He has had known moderate to severe MR dating back to 2022. Echo in 02/2023 also showed severe pulmonary hypertension with RVSP 85 mmHg (previously 74.3  mmHg). He had 2 runs of NSVT lasting up to 6 beats during 02/2023 admission, and metoprolol  increased. Hemodialysis initiated that admission for acute on chronic CKD V with acute on chronic diastolic CHF. Out-patient cardiology follow-up recommended. Since then, he got established at [facility] with Dr. Joetta Mustache. Initial visit on 05/11/23. Primary symptom was exertional dyspnea. An echo and nuclear stress test were ordered. He also recommended starting Eliquis for rate controlled afib. (Eliquis is not currently on his medication list.)   Most recent TTE performed on 06/06/2023 revealed a low normal left ventricular systolic function with an EF of 50%. There was moderate LVH.  LV myocardium displayed a speckled pattern. Left ventricular diastolic Doppler parameters consistent with pseudonormalization (G2DD). There was posterior and lateral wall hypokinesis. Left atrium was severely enlarged. Right ventricle was enlarged with mild systolic dysfunction. RVSP severely elevated at 85 mmHg consistent with known severe pulmonary hypertension. There was severe eccentric mitral valve regurgitation. Additionally, there was mild to moderate tricuspid and trivial pulmonary valve regurgitation. All transvalvular gradients were noted to be normal providing no evidence suggestive of valvular stenosis. Aorta normal in size with no evidence of ectasia or aneurysmal dilatation. TEE was recommended for further evaluation of findings.    Most recent myocardial perfusion imaging study was performed on 06/06/2023 revealing a mildly reduced left ventricular systolic function with an EF of 41%. There was hypokinesis of the lateral wall noted. SPECT images demonstrated a medium size, moderate intensity, fixed defect noted in basal to mid inferolateral/anterolateral wall and basal inferior wall with associated hypokinesis suggestive of prior infarction in left circumflex territory. No was no evidence of significant  reversible ischemia. No significant TID. Overall, study determined  to be intermediate risk.    As above, patient with recent progression from moderate-severe to severe MR. RVSP further elevated since 02/2023. He may ultimately need MV intervention, but would anticipate that [facility] would need to be out by then. Exertional dyspnea is his primary symptoms. He still works at [employer] 2-3 times per week. No chest pain or pressure. Although recent stress test suggestive of prior MI, there was no significant ischemia. At 06/22/23 follow-up with Dr. Bob Burn, he discussed future TEE and RHC/LHC to further assess mitral valve and patient's coronary anatomy. At 08/31/23 preoperative visit, he discussed again, but patient wished to to defer at that time. He was otherwise felt to be "Overall stable from cardiac standpoint with stable volume status, being managed with dialysis." HR well controlled in afib.   In regards to preop risk stratification, "he will be at moderate to high risk considering multiple comorbidities but not prohibitive risk. He is as optimized as can be-no significant volume overload, blood pressure and heart rate well-controlled. Continue current medical therapy including metoprolol , amlodipine  and is on Eliquis for anticoagulation. Can hold Eliquis 2 to 3 days prior to surgery if needed".  He is currently receiving hemodialysis via a right internal jugular tunneled dialysis catheter which was placed on 03/19/23. Long term hemodialysis access desired.    Eliquis is not on his current medication list. Medications include Lopressor  50 mg BID, amlodipine  10 mg daily, torsemide  50 md daily, and ASA 81 mg daily.    A1c 8.5% on 03/13/23. By current medication list, he is on Novolog  SSI.   Anesthesia team to evaluate on the day of surgery. Per my previous discussion with Dr. Charlotte Cookey, he was hopeful procedure could be done with block and local anesthesia.      VS:  Wt Readings from Last 3 Encounters:   08/23/23 83.7 kg  07/20/23 83.6 kg  06/25/23 84.3 kg   BP Readings from Last 3 Encounters:  08/23/23 (!) 160/90  07/20/23 (!) 155/91  06/25/23 (!) 164/101   Pulse Readings from Last 3 Encounters:  08/23/23 95  07/20/23 80  06/25/23 83     PROVIDERS: Rueben Cote, FNP-BC is PCP  Joetta Mustache, MD is cardiologist [facility]) Rica Chalet, MD is nephrologist    LABS:  Most recent labs in [facility] include: Lab Results  Component Value Date   WBC 7.0 07/20/2023   HGB 11.6 (L) 07/20/2023   HCT 37.4 (L) 07/20/2023   PLT 176 07/20/2023   GLUCOSE 358 (H) 07/20/2023   ALT 23 03/12/2023   AST 17 03/12/2023   NA 139 07/20/2023   K 4.7 07/20/2023   CL 104 07/20/2023   CREATININE 7.20 (H) 07/20/2023   BUN 72 (H) 07/20/2023   CO2 22 07/20/2023   TSH 2.465 03/30/2022   HGBA1C 8.5 (H) 03/13/2023     IMAGES: CXR (in setting of acute on chronic CHF/renal failure) 03/12/23: IMPRESSION: 1. Stable mild cardiomegaly and moderate severity pulmonary vascular congestion. 2. Mild left basilar atelectasis.     EKG: EKG 07/20/23: Atrial flutter with variable A-V block Minimal voltage criteria for LVH, may be normal variant ( Cornell product ) Nonspecific ST and T wave abnormality Abnormal ECG When compared with ECG of 20-Feb-2023 14:01, Atrial flutter has replaced Atrial fibrillation Nonspecific T wave abnormality no longer evident in Inferior leads Confirmed by Belva Boyden [phone]) on 07/27/2023 12:49:50 PM   EKG 05/11/23: Per narrative in [facility]: Atrial fibrillation at 76 bpm  Nonspecific T  wave abnormalities  Abnormal ECG  No previous ECGs available  I reviewed and concur with this report. Electronically signed [email or web address], Leo Rainbow [phone]) on 06/07/2023 2:25:52 PM      CV: Nuclear stress test 06/16/23 ([facility]): Pharmacological stress test is abnormal.  SPECT images demonstrate medium size, moderate intensity, fixed defect in  basal to mid  inferolateral/anterolateral wall and basal inferior wall with  associated hypokinesis suggestive of prior infarction in left circumflex  territory.  No ischemia, sum difference score 0.  Left ventricle normal in size with reduced LVEF of 41%.  No significant  TID.  Intermediate risk study.      Echo 06/06/23 ([facility]): CONCLUSION: Mild left ventricular systolic dysfunction with moderate LVH.  Estimated EF 50%.  Elevated LA pressures with diastolic dysfunction (Grade 2). Mild right ventricular systolic dysfunction Valvular regurgitation: Trivial AR, Severe MR, Trivial PR, Mild TR No valvular stenosis. PHYSICIAN IMPRESSIONS: LV myocardium has speckled pattern. Mildly reduced LVEF with wall motion abnormality as below. Severe eccentric mitral regurgitation. Recommend TEE for further evaluation. Mild to moderate tricuspid regurgitation. Severe pulmonary hypertension, RVSP 85 mmHg. - Comparison echo 03/13/23: LVEF 45-50%, global LV hypokinesis, mildly dilated LV cavity, moderate LVH, grade III DD, elevated LA pressure, abnormal LV global longitudinal strain of -14.4%, normal RVSF, mild RVH, severely elevated PASO, RVSP 74.3 mmHg, severely dilated LA, small pericardial effusion, moderate-severe (may be underestimated) MR, mild-moderate TR, mild MR; 06/01/20: LVEF 50-55%, no RWMA, moderate LVH, moderate-severe MR.    Past Medical History:  Diagnosis Date   Anemia of chronic renal failure    Arthritis    CAD (coronary artery disease)    a. CT chest 05/31/2020: extensive CAD; b. MV 06/06/2023: EF 41%, mod sized fixed defect in basal to mid interolat/anterolat and basal inf wall associated with hypokinesis suggestive of prior infarct in LCx territory. No ischemia.   Cardiomegaly    Cerebral hemorrhage, nontraumatic (HCC)    a. brain MRI 06/01/2020: chronic microhemorrhage in the right parietal lobe   Cerebral microvascular disease    Chronic Heart failure with mid-range ejection fraction (HCC)     a.TTE 03/13/2023: EF 45-50%, glob HK, mild LV dil, mod LVH, G3DD, GLS -14.4%, RVSP 74.3, mod-sev MR, mild-mod TR, AoV sclerosis without stenosis; b.TTE 06/06/2023: EF 50%, mod LVH, G2DD, severe LAE, RAE/RVE,  mildly reduced RVSF, RVSP 85, triv AR/PR, sev MR   Depression    DOE (dyspnea on exertion)    ESRD (end stage renal disease) on dialysis (HCC)    Heart murmur    Hyperlipidemia    Hypertension    Insomnia    a. takes melatonin PRN   Long-term use of aspirin  therapy    NSVT (nonsustained ventricular tachycardia) (HCC)    PAF (paroxysmal atrial fibrillation) (HCC)    PONV (postoperative nausea and vomiting)    Severe mitral regurgitation    a. TTE 06/01/2020: mod-severe MR; b. TTE 03/13/2023: mod-severe MR; c. TTE 06/06/2023: severe MR   Severe pulmonary hypertension (HCC)    a. TTE 03/13/2023: RVSP 74.3 mmHg; b. TTE 06/06/2023: RSVP 85 mmHg.   T2DM (type 2 diabetes mellitus) (HCC)     Past Surgical History:  Procedure Laterality Date   COLONOSCOPY     COLONOSCOPY WITH PROPOFOL  N/A 05/19/2016   Procedure: COLONOSCOPY WITH PROPOFOL ;  Surgeon: Jerlean Mood, MD;  Location: [facility] ENDOSCOPY;  Service: Endoscopy;  Laterality: N/A;   DIALYSIS/PERMA CATHETER INSERTION Right 03/19/2023   Procedure: DIALYSIS/PERMA CATHETER INSERTION;  Surgeon: Celso College,  MD;  Location: [facility] INVASIVE CV LAB;  Service: Cardiovascular;  Laterality: Right;   TEMPORARY DIALYSIS CATHETER N/A 03/13/2023   Procedure: TEMPORARY DIALYSIS CATHETER;  Surgeon: Jackquelyn Mass, MD;  Location: [facility] INVASIVE CV LAB;  Service: Cardiovascular;  Laterality: N/A;    MEDICATIONS: No current facility-administered medications for this encounter.    acetaminophen  (TYLENOL ) 325 MG tablet   amLODipine  (NORVASC ) 10 MG tablet   aspirin  EC 81 MG tablet   blood glucose meter kit and supplies   calcitRIOL  (ROCALTROL ) 0.25 MCG capsule   calcium  acetate (PHOSLO ) 667 MG capsule   docusate sodium (COLACE) 100 MG capsule    Ferrous Sulfate (SLOW FE) 142 (45 Fe) MG TBCR   insulin  aspart (NOVOLOG ) 100 UNIT/ML injection   melatonin 5 MG TABS   metoprolol  tartrate (LOPRESSOR ) 50 MG tablet   multivitamin (RENA-VIT) TABS tablet   torsemide  (DEMADEX ) 100 MG tablet    Ella Gun, PA-C Surgical Short Stay/Anesthesiology [facility] Phone [phone] [facility] Phone [phone] 09/18/2023 12:56 PM

## 2023-09-18 NOTE — Progress Notes (Signed)
 Pts wife aware of new arrival time for surgery at 59.

## 2023-09-18 NOTE — Anesthesia Preprocedure Evaluation (Addendum)
 Anesthesia Evaluation  Patient identified by MRN, date of birth, ID band Patient awake    Reviewed: Allergy & Precautions, H&P , NPO status , Patient's Chart, lab work & pertinent test results  History of Anesthesia Complications (+) PONV and history of anesthetic complications  Airway Mallampati: II  TM Distance: >3 FB Neck ROM: Full    Dental  (+) Edentulous Upper   Pulmonary former smoker   Pulmonary exam normal breath sounds clear to auscultation       Cardiovascular hypertension, pulmonary hypertension+ CAD and +CHF  Normal cardiovascular exam+ Valvular Problems/Murmurs MR  Rhythm:Regular Rate:Normal  Nuclear stress test 06/16/23 ([facility]): Pharmacological stress test is abnormal.  SPECT images demonstrate medium size, moderate intensity, fixed defect in  basal to mid inferolateral/anterolateral wall and basal inferior wall with  associated hypokinesis suggestive of prior infarction in left circumflex  territory.  No ischemia, sum difference score 0.  Left ventricle normal in size with reduced LVEF of 41%.  No significant  TID.  Intermediate risk study.      Echo 06/06/23 ([facility]): CONCLUSION: 1. Mild left ventricular systolic dysfunction with moderate LVH.  Estimated EF 50%.  Elevated LA pressures with diastolic dysfunction (Grade 2). 2. Mild right ventricular systolic dysfunction 3. Valvular regurgitation: Trivial AR, Severe MR, Trivial PR, Mild TR 4. No valvular stenosis. PHYSICIAN IMPRESSIONS: LV myocardium has speckled pattern. Mildly reduced LVEF with wall motion abnormality as below. Severe eccentric mitral regurgitation. Recommend TEE for further evaluation. Mild to moderate tricuspid regurgitation. Severe pulmonary hypertension, RVSP 85 mmHg.    Neuro/Psych  PSYCHIATRIC DISORDERS  Depression    negative neurological ROS     GI/Hepatic negative GI ROS, Neg liver ROS,,,  Endo/Other  diabetes     Renal/GU Renal disease  negative genitourinary   Musculoskeletal  (+) Arthritis ,    Abdominal   Peds negative pediatric ROS (+)  Hematology  (+) Blood dyscrasia, anemia   Anesthesia Other Findings   Reproductive/Obstetrics negative OB ROS                             Anesthesia Physical Anesthesia Plan  ASA: 4  Anesthesia Plan: General   Post-op Pain Management: Tylenol  PO (pre-op )*   Induction: Intravenous  PONV Risk Score and Plan: 3 and Ondansetron , Dexamethasone and Treatment may vary due to age or medical condition  Airway Management Planned: Oral ETT  Additional Equipment: Arterial line  Intra-op Plan:   Post-operative Plan: Extubation in OR  Informed Consent: I have reviewed the patients History and Physical, chart, labs and discussed the procedure including the risks, benefits and alternatives for the proposed anesthesia with the patient or authorized representative who has indicated his/her understanding and acceptance.     Dental advisory given  Plan Discussed with: CRNA  Anesthesia Plan Comments: (See PAT note written 09/18/2023 by Dickie Cloe, PA-C.  )       Anesthesia Quick Evaluation

## 2023-09-18 NOTE — Progress Notes (Addendum)
 SDW CALL  Patient at dialysis. Instructions given to pt's wife Brailen Macneal over the phone. The opportunity was given for Mrs Gosline to ask questions. No further questions asked. Mrs. Gorden verbalized understanding of instructions given and she stated that she would have her husband call with questions when he retuns from dialysis.    PCP - Malena Scull Cardiologist - Leo Rainbow Alluri,MD-[facility] LOV-09/03/23 pre op eval  PPM/ICD - denies Device Orders -  Rep Notified -   Chest x-ray - 03/12/23 EKG - 08/31/23 Stress Test - 05/2023 ECHO - 08/31/23-tracing requested Cardiac Cath -   Sleep Study - denies CPAP -   Fasting Blood Sugar - unknown Checks Blood Sugar _____ times a day-patient's wife says she has never seen him check it. Gave her instructions for patient to check blood sugar AM of sugar and treatment for hypoglycemia as follows-  If you take Novolog  insulin  at night then - THE NIGHT BEFORE SURGERY, take 1/2 of the dose of your sliding scale of Novolog  insulin   If you take Novolog  insulin  in the morning then the day of surgery-   If your CBG is greater than 220 mg/dL the morning of surgery, you may take  of your sliding scale (correction) dose of Novolog  insulin .  Check your blood sugar the morning of your surgery when you wake up  If your blood sugar is less than 70 mg/dL, you will need to treat for low blood sugar: Do not take insulin . Treat a low blood sugar (less than 70 mg/dL) with  cup of clear juice (cranberry or apple), 4 glucose tablets, OR glucose gel. Recheck blood sugar in 15 minutes after treatment (to make sure it is greater than 70 mg/dL). If your blood sugar is not greater than 70 mg/dL on recheck, call [phone] for further instructions.  Blood Thinner Instructions:Eliquis -stop April 19. Patient's wife was not sure when patient stopped Eliquis. She will have patient call me when he returns from dialysis. Pt called to say he had not taken  Eliquis in about 3 weeks. Called Dr. Mardel Shad office and related this conversation which will be shared with nurse who will call patient.  Aspirin  Instructions:continue  ERAS Protcol -no PRE-SURGERY Ensure or G2-   COVID TEST- na   Anesthesia review: yes-afib,heart failure,pulmonary HTN. Reviewed by Margretta Shi 08/02/23    Special instructions:    Oral Hygiene is also important to reduce your risk of infection.  Remember - BRUSH YOUR TEETH THE MORNING OF SURGERY WITH YOUR REGULAR TOOTHPASTE

## 2023-09-19 ENCOUNTER — Encounter (HOSPITAL_COMMUNITY)
Admission: RE | Disposition: A | Discharge status: Home / Self Care | Payer: Self-pay | Source: Home / Self Care | Attending: Surgery

## 2023-09-19 ENCOUNTER — Ambulatory Visit (HOSPITAL_BASED_OUTPATIENT_CLINIC_OR_DEPARTMENT_OTHER): Admitting: Vascular Surgery

## 2023-09-19 ENCOUNTER — Encounter (HOSPITAL_COMMUNITY): Payer: Self-pay | Admitting: Surgery

## 2023-09-19 ENCOUNTER — Ambulatory Visit (HOSPITAL_COMMUNITY): Admitting: Vascular Surgery

## 2023-09-19 ENCOUNTER — Other Ambulatory Visit: Payer: Self-pay

## 2023-09-19 ENCOUNTER — Ambulatory Visit (HOSPITAL_COMMUNITY)
Admission: RE | Admit: 2023-09-19 | Discharge: 2023-09-19 | Disposition: A | Discharge status: Home / Self Care | Attending: Surgery | Admitting: Surgery

## 2023-09-19 DIAGNOSIS — E1122 Type 2 diabetes mellitus with diabetic chronic kidney disease: Secondary | ICD-10-CM | POA: Diagnosis not present

## 2023-09-19 DIAGNOSIS — I081 Rheumatic disorders of both mitral and tricuspid valves: Secondary | ICD-10-CM | POA: Diagnosis not present

## 2023-09-19 DIAGNOSIS — Z992 Dependence on renal dialysis: Secondary | ICD-10-CM | POA: Insufficient documentation

## 2023-09-19 DIAGNOSIS — F32A Depression, unspecified: Secondary | ICD-10-CM | POA: Insufficient documentation

## 2023-09-19 DIAGNOSIS — N185 Chronic kidney disease, stage 5: Secondary | ICD-10-CM | POA: Diagnosis not present

## 2023-09-19 DIAGNOSIS — I509 Heart failure, unspecified: Secondary | ICD-10-CM | POA: Diagnosis not present

## 2023-09-19 DIAGNOSIS — M1990 Unspecified osteoarthritis, unspecified site: Secondary | ICD-10-CM | POA: Diagnosis not present

## 2023-09-19 DIAGNOSIS — N186 End stage renal disease: Secondary | ICD-10-CM

## 2023-09-19 DIAGNOSIS — I132 Hypertensive heart and chronic kidney disease with heart failure and with stage 5 chronic kidney disease, or end stage renal disease: Secondary | ICD-10-CM

## 2023-09-19 DIAGNOSIS — D631 Anemia in chronic kidney disease: Secondary | ICD-10-CM | POA: Insufficient documentation

## 2023-09-19 DIAGNOSIS — I4891 Unspecified atrial fibrillation: Secondary | ICD-10-CM | POA: Diagnosis not present

## 2023-09-19 DIAGNOSIS — I272 Pulmonary hypertension, unspecified: Secondary | ICD-10-CM | POA: Diagnosis not present

## 2023-09-19 DIAGNOSIS — Z87891 Personal history of nicotine dependence: Secondary | ICD-10-CM | POA: Insufficient documentation

## 2023-09-19 DIAGNOSIS — I2510 Atherosclerotic heart disease of native coronary artery without angina pectoris: Secondary | ICD-10-CM | POA: Diagnosis not present

## 2023-09-19 DIAGNOSIS — I252 Old myocardial infarction: Secondary | ICD-10-CM | POA: Insufficient documentation

## 2023-09-19 DIAGNOSIS — Z79899 Other long term (current) drug therapy: Secondary | ICD-10-CM | POA: Insufficient documentation

## 2023-09-19 DIAGNOSIS — Z794 Long term (current) use of insulin: Secondary | ICD-10-CM | POA: Insufficient documentation

## 2023-09-19 HISTORY — PX: AV FISTULA PLACEMENT: SHX1204

## 2023-09-19 LAB — POCT I-STAT, CHEM 8
BUN: 43 mg/dL — ABNORMAL HIGH (ref 8–23)
Calcium, Ion: 1.01 mmol/L — ABNORMAL LOW (ref 1.15–1.40)
Chloride: 106 mmol/L (ref 98–111)
Creatinine, Ser: 6.8 mg/dL — ABNORMAL HIGH (ref 0.61–1.24)
Glucose, Bld: 202 mg/dL — ABNORMAL HIGH (ref 70–99)
HCT: 41 % (ref 39.0–52.0)
Hemoglobin: 13.9 g/dL (ref 13.0–17.0)
Potassium: 4.8 mmol/L (ref 3.5–5.1)
Sodium: 139 mmol/L (ref 135–145)
TCO2: 25 mmol/L (ref 22–32)

## 2023-09-19 LAB — GLUCOSE, CAPILLARY
Glucose-Capillary: 199 mg/dL — ABNORMAL HIGH (ref 70–99)
Glucose-Capillary: 145 mg/dL — ABNORMAL HIGH (ref 70–99)
Glucose-Capillary: 127 mg/dL — ABNORMAL HIGH (ref 70–99)

## 2023-09-19 SURGERY — ARTERIOVENOUS (AV) FISTULA CREATION
Anesthesia: General | Site: Arm Upper | Laterality: Left

## 2023-09-19 MED ORDER — SUGAMMADEX SODIUM 200 MG/2ML IV SOLN
INTRAVENOUS | Status: DC | PRN
  Administered 2023-09-19: 200 mg via INTRAVENOUS

## 2023-09-19 MED ORDER — PROPOFOL 10 MG/ML IV BOLUS
INTRAVENOUS | Status: DC | PRN
Start: 2023-09-19 — End: 2023-09-19
  Administered 2023-09-19: 20 mg via INTRAVENOUS
  Administered 2023-09-19: 50 mg via INTRAVENOUS

## 2023-09-19 MED ORDER — METOPROLOL TARTRATE 50 MG PO TABS
50.0000 mg | ORAL_TABLET | Freq: Once | ORAL | Status: AC
Start: 2023-09-19 — End: 2023-09-19
  Administered 2023-09-19: 50 mg via ORAL
  Filled 2023-09-19: qty 1

## 2023-09-19 MED ORDER — ROCURONIUM BROMIDE 10 MG/ML (PF) SYRINGE
PREFILLED_SYRINGE | INTRAVENOUS | Status: DC | PRN
Start: 2023-09-19 — End: 2023-09-19
  Administered 2023-09-19: 60 mg via INTRAVENOUS

## 2023-09-19 MED ORDER — ACETAMINOPHEN 10 MG/ML IV SOLN
INTRAVENOUS | Status: AC
  Filled 2023-09-19: qty 100

## 2023-09-19 MED ORDER — ACETAMINOPHEN 500 MG PO TABS: 1000.0000 mg | ORAL_TABLET | Freq: Once | ORAL | Status: DC

## 2023-09-19 MED ORDER — PHENYLEPHRINE 80 MCG/ML (10ML) SYRINGE FOR IV PUSH (FOR BLOOD PRESSURE SUPPORT)
PREFILLED_SYRINGE | INTRAVENOUS | Status: AC
  Filled 2023-09-19: qty 10

## 2023-09-19 MED ORDER — CALCIUM GLUCONATE 10 % IV SOLN
INTRAVENOUS | Status: DC | PRN
  Administered 2023-09-19: .8 g via INTRAVENOUS

## 2023-09-19 MED ORDER — CHLORHEXIDINE GLUCONATE 0.12 % MT SOLN
15.0000 mL | Freq: Once | OROMUCOSAL | Status: AC
  Administered 2023-09-19: 15 mL via OROMUCOSAL
  Filled 2023-09-19: qty 15

## 2023-09-19 MED ORDER — PHENYLEPHRINE 80 MCG/ML (10ML) SYRINGE FOR IV PUSH (FOR BLOOD PRESSURE SUPPORT)
PREFILLED_SYRINGE | INTRAVENOUS | Status: DC | PRN
  Administered 2023-09-19: 80 ug via INTRAVENOUS

## 2023-09-19 MED ORDER — OXYCODONE HCL 5 MG PO TABS: 5.0000 mg | ORAL_TABLET | Freq: Once | ORAL | Status: DC | PRN

## 2023-09-19 MED ORDER — INSULIN ASPART 100 UNIT/ML IJ SOLN
0.0000 [IU] | INTRAMUSCULAR | Status: DC | PRN
  Administered 2023-09-19: 4 [IU] via SUBCUTANEOUS
  Filled 2023-09-19: qty 1

## 2023-09-19 MED ORDER — SODIUM CHLORIDE 0.9 % IV SOLN: INTRAVENOUS | Status: DC

## 2023-09-19 MED ORDER — 0.9 % SODIUM CHLORIDE (POUR BTL) OPTIME
TOPICAL | Status: DC | PRN
  Administered 2023-09-19: 1000 mL

## 2023-09-19 MED ORDER — HEPARIN 6000 UNIT IRRIGATION SOLUTION
Status: DC | PRN
  Administered 2023-09-19: 1

## 2023-09-19 MED ORDER — FENTANYL CITRATE (PF) 250 MCG/5ML IJ SOLN
INTRAMUSCULAR | Status: AC
Start: 2023-09-19 — End: ?
  Filled 2023-09-19: qty 5

## 2023-09-19 MED ORDER — ROCURONIUM BROMIDE 10 MG/ML (PF) SYRINGE
PREFILLED_SYRINGE | INTRAVENOUS | Status: AC
  Filled 2023-09-19: qty 10

## 2023-09-19 MED ORDER — LIDOCAINE 2% (20 MG/ML) 5 ML SYRINGE
INTRAMUSCULAR | Status: AC
  Filled 2023-09-19: qty 5

## 2023-09-19 MED ORDER — FENTANYL CITRATE (PF) 100 MCG/2ML IJ SOLN
25.0000 ug | INTRAMUSCULAR | Status: DC | PRN
Start: 2023-09-19 — End: 2023-09-19

## 2023-09-19 MED ORDER — ORAL CARE MOUTH RINSE: 15.0000 mL | Freq: Once | OROMUCOSAL | Status: AC

## 2023-09-19 MED ORDER — CEFAZOLIN SODIUM-DEXTROSE 2-4 GM/100ML-% IV SOLN
2.0000 g | INTRAVENOUS | Status: AC
  Administered 2023-09-19: 2 g via INTRAVENOUS
  Filled 2023-09-19: qty 100

## 2023-09-19 MED ORDER — SODIUM CHLORIDE 0.9% FLUSH: 3.0000 mL | INTRAVENOUS | Status: DC | PRN

## 2023-09-19 MED ORDER — EPINEPHRINE PF 1 MG/ML IJ SOLN: INTRAMUSCULAR | Status: DC | PRN

## 2023-09-19 MED ORDER — OXYCODONE-ACETAMINOPHEN 5-325 MG PO TABS: 1.0000 | ORAL_TABLET | Freq: Four times a day (QID) | ORAL | 0 refills | Status: DC | PRN

## 2023-09-19 MED ORDER — FENTANYL CITRATE (PF) 250 MCG/5ML IJ SOLN
INTRAMUSCULAR | Status: DC | PRN
  Administered 2023-09-19 (×2): 50 ug via INTRAVENOUS

## 2023-09-19 MED ORDER — ACETAMINOPHEN 10 MG/ML IV SOLN: 1000.0000 mg | Freq: Once | INTRAVENOUS | Status: DC | PRN

## 2023-09-19 MED ORDER — MIDAZOLAM HCL 2 MG/2ML IJ SOLN
INTRAMUSCULAR | Status: DC | PRN
Start: 2023-09-19 — End: 2023-09-19
  Administered 2023-09-19: 1 mg via INTRAVENOUS

## 2023-09-19 MED ORDER — SODIUM CHLORIDE 0.9 % IV SOLN
INTRAVENOUS | Status: DC | PRN
  Administered 2023-09-19: 4 ug/min via INTRAVENOUS

## 2023-09-19 MED ORDER — VASOPRESSIN 20 UNIT/ML IV SOLN
INTRAVENOUS | Status: DC | PRN
  Administered 2023-09-19 (×4): 2 [IU] via INTRAVENOUS

## 2023-09-19 MED ORDER — ONDANSETRON HCL 4 MG/2ML IJ SOLN
INTRAMUSCULAR | Status: AC
  Filled 2023-09-19: qty 2

## 2023-09-19 MED ORDER — MIDAZOLAM HCL 2 MG/2ML IJ SOLN
INTRAMUSCULAR | Status: AC
  Filled 2023-09-19: qty 2

## 2023-09-19 MED ORDER — CHLORHEXIDINE GLUCONATE 4 % EX SOLN
60.0000 mL | Freq: Once | CUTANEOUS | Status: DC
Start: 2023-09-20 — End: 2023-09-19

## 2023-09-19 MED ORDER — ONDANSETRON HCL 4 MG/2ML IJ SOLN
INTRAMUSCULAR | Status: DC | PRN
  Administered 2023-09-19: 4 mg via INTRAVENOUS

## 2023-09-19 MED ORDER — PHENYLEPHRINE HCL-NACL 20-0.9 MG/250ML-% IV SOLN
INTRAVENOUS | Status: DC | PRN
  Administered 2023-09-19: 30 ug/min via INTRAVENOUS

## 2023-09-19 MED ORDER — CHLORHEXIDINE GLUCONATE 4 % EX SOLN: 60.0000 mL | Freq: Once | CUTANEOUS | Status: DC

## 2023-09-19 MED ORDER — OXYCODONE HCL 5 MG/5ML PO SOLN: 5.0000 mg | Freq: Once | ORAL | Status: DC | PRN

## 2023-09-19 MED ORDER — EPINEPHRINE PF 1 MG/ML IJ SOLN
INTRAMUSCULAR | Status: DC | PRN
Start: 2023-09-19 — End: 2023-09-19
  Administered 2023-09-19: .06 mg via INTRAVENOUS
  Administered 2023-09-19: .03 mg via INTRAVENOUS
  Administered 2023-09-19: .01 mg via INTRAVENOUS

## 2023-09-19 MED ORDER — DROPERIDOL 2.5 MG/ML IJ SOLN: 0.6250 mg | Freq: Once | INTRAMUSCULAR | Status: DC | PRN

## 2023-09-19 MED ORDER — PROPOFOL 10 MG/ML IV BOLUS
INTRAVENOUS | Status: AC
  Filled 2023-09-19: qty 20

## 2023-09-19 SURGICAL SUPPLY — 25 items
ARMBAND PINK RESTRICT EXTREMIT (MISCELLANEOUS) ×4 IMPLANT
BAG COUNTER SPONGE SURGICOUNT (BAG) ×2 IMPLANT
CANISTER SUCT 3000ML PPV (MISCELLANEOUS) ×2 IMPLANT
CLIP TI MEDIUM 6 (CLIP) ×2 IMPLANT
CLIP TI WIDE RED SMALL 6 (CLIP) ×2 IMPLANT
COVER PROBE W GEL 5X96 (DRAPES) ×2 IMPLANT
DERMABOND ADVANCED .7 DNX12 (GAUZE/BANDAGES/DRESSINGS) ×2 IMPLANT
ELECTRODE REM PT RTRN 9FT ADLT (ELECTROSURGICAL) ×2 IMPLANT
GLOVE SURG SS PI 7.5 STRL IVOR (GLOVE) ×6 IMPLANT
GOWN STRL REUS W/ TWL LRG LVL3 (GOWN DISPOSABLE) ×4 IMPLANT
GOWN STRL REUS W/ TWL XL LVL3 (GOWN DISPOSABLE) ×2 IMPLANT
HEMOSTAT SNOW SURGICEL 2X4 (HEMOSTASIS) IMPLANT
KIT BASIN OR (CUSTOM PROCEDURE TRAY) ×2 IMPLANT
KIT TURNOVER KIT B (KITS) ×2 IMPLANT
NS IRRIG 1000ML POUR BTL (IV SOLUTION) ×2 IMPLANT
PACK CV ACCESS (CUSTOM PROCEDURE TRAY) ×2 IMPLANT
PAD ARMBOARD POSITIONER FOAM (MISCELLANEOUS) ×4 IMPLANT
SLING ARM FOAM STRAP LRG (SOFTGOODS) IMPLANT
SLING ARM FOAM STRAP MED (SOFTGOODS) IMPLANT
SUT PROLENE 6 0 CC (SUTURE) ×2 IMPLANT
SUT VIC AB 3-0 SH 27X BRD (SUTURE) ×2 IMPLANT
SUT VICRYL 4-0 PS2 18IN ABS (SUTURE) ×2 IMPLANT
TOWEL GREEN STERILE (TOWEL DISPOSABLE) ×2 IMPLANT
UNDERPAD 30X36 HEAVY ABSORB (UNDERPADS AND DIAPERS) ×2 IMPLANT
WATER STERILE IRR 1000ML POUR (IV SOLUTION) ×2 IMPLANT

## 2023-09-19 NOTE — Consult Note (Signed)
 Vascular and Vein Specialist of [facility]  Patient name: Victor Hernandez MRN: [id number] DOB: 04-Jun-1955 Sex: male   REQUESTING PROVIDER:   Dr. Prescilla Brod   REASON FOR CONSULT:    Dialysis access  HISTORY OF PRESENT ILLNESS:   Victor Hernandez is a 68 y.o. male, who I have been asked to evaluate for dialysis access.  He was originally seen in [place], and left sided access was recommended however this was unable to be performed due to his anesthesia needs and so transfer was recommended to [facility].  I had him scheduled several weeks ago however he required further cardiac testing before proceeding with surgery.  This has been completed and he is here today for fistula versus graft.  He has had vein mapping performed that shows a possible adequate cephalic vein on the left.  He is right-handed.  He currently dialyzes through a catheter.  The patient suffers from type 2 diabetes.  He has a significant coronary history, status post MI.  He also has a history of A-fib and V. tach.  He does have congestive heart failure with global hypokinesis.  He is medically managed for hypertension.  He is a former smoker.  PAST MEDICAL HISTORY    Past Medical History:  Diagnosis Date   Anemia of chronic renal failure    Arthritis    CAD (coronary artery disease)    a. CT chest 05/31/2020: extensive CAD; b. MV 06/06/2023: EF 41%, mod sized fixed defect in basal to mid interolat/anterolat and basal inf wall associated with hypokinesis suggestive of prior infarct in LCx territory. No ischemia.   Cardiomegaly    Cerebral hemorrhage, nontraumatic (HCC)    a. brain MRI 06/01/2020: chronic microhemorrhage in the right parietal lobe   Cerebral microvascular disease    Chronic Heart failure with mid-range ejection fraction (HCC)    a.TTE 03/13/2023: EF 45-50%, glob HK, mild LV dil, mod LVH, G3DD, GLS -14.4%, RVSP 74.3, mod-sev MR, mild-mod TR, AoV sclerosis without stenosis;  b.TTE 06/06/2023: EF 50%, mod LVH, G2DD, severe LAE, RAE/RVE,  mildly reduced RVSF, RVSP 85, triv AR/PR, sev MR   Depression    DOE (dyspnea on exertion)    ESRD (end stage renal disease) on dialysis (HCC)    Heart murmur    Hyperlipidemia    Hypertension    Insomnia    a. takes melatonin PRN   Long-term use of aspirin  therapy    NSVT (nonsustained ventricular tachycardia) (HCC)    PAF (paroxysmal atrial fibrillation) (HCC)    PONV (postoperative nausea and vomiting)    Severe mitral regurgitation    a. TTE 06/01/2020: mod-severe MR; b. TTE 03/13/2023: mod-severe MR; c. TTE 06/06/2023: severe MR   Severe pulmonary hypertension (HCC)    a. TTE 03/13/2023: RVSP 74.3 mmHg; b. TTE 06/06/2023: RSVP 85 mmHg.   T2DM (type 2 diabetes mellitus) (HCC)      FAMILY HISTORY   History reviewed. No pertinent family history.  SOCIAL HISTORY:   Social History   Socioeconomic History   Marital status: Married    Spouse name: Not on file   Number of children: Not on file   Years of education: Not on file   Highest education level: Not on file  Occupational History   Not on file  Tobacco Use   Smoking status: Former    Current packs/day: 0.00    Types: Cigarettes    Quit date: 1976    Years since quitting: 49.3   Smokeless  tobacco: Never  Vaping Use   Vaping status: Never Used  Substance and Sexual Activity   Alcohol use: No    Comment: "a beer once in awhile"   Drug use: No   Sexual activity: Not on file  Other Topics Concern   Not on file  Social History Narrative   Not on file   Social Drivers of Health   Financial Resource Strain: Not on file  Food Insecurity: No Food Insecurity (03/13/2023)   Hunger Vital Sign    Worried About Running Out of Food in the Last Year: Never true    Ran Out of Food in the Last Year: Never true  Transportation Needs: No Transportation Needs (03/13/2023)   PRAPARE - Administrator, Civil Service (Medical): No    Lack of  Transportation (Non-Medical): No  Physical Activity: Not on file  Stress: Not on file  Social Connections: Not on file  Intimate Partner Violence: Not At Risk (03/13/2023)   Humiliation, Afraid, Rape, and Kick questionnaire    Fear of Current or Ex-Partner: No    Emotionally Abused: No    Physically Abused: No    Sexually Abused: No    ALLERGIES:    No Known Allergies  CURRENT MEDICATIONS:    Current Facility-Administered Medications  Medication Dose Route Frequency Provider Last Rate Last Admin   0.9 %  sodium chloride  infusion   Intravenous Continuous Lethaniel Rave, MD       acetaminophen  (TYLENOL ) tablet 1,000 mg  1,000 mg Oral Once Lethaniel Rave, MD       ceFAZolin  (ANCEF ) IVPB 2g/100 mL premix  2 g Intravenous 30 min Pre-Op  Margherita Shell, MD       chlorhexidine  (HIBICLENS ) 4 % liquid 4 Application  60 mL Topical Once Margherita Shell, MD       And   Cecily Cohen ON 09/20/2023] chlorhexidine  (HIBICLENS ) 4 % liquid 4 Application  60 mL Topical Once Margherita Shell, MD       insulin  aspart (novoLOG ) injection 0-14 Units  0-14 Units Subcutaneous Q2H PRN Lethaniel Rave, MD   4 Units at 09/19/23 0818   sodium chloride  flush (NS) 0.9 % injection 3-10 mL  3-10 mL Intravenous PRN Margherita Shell, MD        REVIEW OF SYSTEMS:   [X]  denotes positive finding, [ ]  denotes negative finding Cardiac  Comments:  Chest pain or chest pressure:    Shortness of breath upon exertion:    Short of breath when lying flat:    Irregular heart rhythm:        Vascular    Pain in calf, thigh, or hip brought on by ambulation:    Pain in feet at night that wakes you up from your sleep:     Blood clot in your veins:    Leg swelling:         Pulmonary    Oxygen at home:    Productive cough:     Wheezing:         Neurologic    Sudden weakness in arms or legs:     Sudden numbness in arms or legs:     Sudden onset of difficulty speaking or slurred speech:    Temporary loss of vision in one eye:      Problems with dizziness:         Gastrointestinal    Blood in stool:      Vomited blood:  Genitourinary    Burning when urinating:     Blood in urine:        Psychiatric    Major depression:         Hematologic    Bleeding problems:    Problems with blood clotting too easily:        Skin    Rashes or ulcers:        Constitutional    Fever or chills:     PHYSICAL EXAM:   Vitals:   09/19/23 0733  BP: (!) 155/108  Pulse: 82  Resp: 20  Temp: 98.1 F (36.7 C)  TempSrc: Oral  SpO2: 94%  Weight: 82.6 kg  Height: 5\' 11"  (1.803 m)    GENERAL: The patient is a well-nourished male, in no acute distress. The vital signs are documented above. CARDIAC: There is a regular rate and rhythm.  VASCULAR: Palpable left radial pulse PULMONARY: Nonlabored respirations ABDOMEN: Soft and non-tender with normal pitched bowel sounds.  MUSCULOSKELETAL: There are no major deformities or cyanosis. NEUROLOGIC: No focal weakness or paresthesias are detected. SKIN: There are no ulcers or rashes noted. PSYCHIATRIC: The patient has a normal affect.  STUDIES:   I have reviewed his vein mapping that shows a 3 mm left-sided cephalic vein  ASSESSMENT and PLAN   End-stage renal disease: I discussed with the patient and his wife that we will plan on left-sided access.  Hopefully this will be able to be a fistula.  I discussed the risk of not maturity and the need for future interventions.  We discussed the risk of steal.  All of his questions were answered.   Marti Slates, MD, FACS [facility]ists of [facility] [phone] Pager [phone]

## 2023-09-19 NOTE — Progress Notes (Addendum)
 Pt seen in recovery.  RN reports he has been seen by anesthesia and pt is stable and doing well and feels he is ok to discharge home.    Pt fistula with good thrill and palpable left radial pulse.    Discussed that we will see him back in about 6 weeks with an u/s to evaluate fistula.  Discussed with him that it cannot be used for 3 months despite being told it can be used right away by tech at dialysis per pt.  Discussed that it may not mature and may need additional procedures.  Discussed that access does not last forever.  He expressed understanding.      Maryanna Smart, [facility] 09/19/2023 1:26 PM   I agree with the above.  I have seen and evaluated the patient in PACU.  He is sitting up in bed without any issues.  He does have some mild numbness in the tip of his fingers on the left.  There is a palpable thrill within his fistula.  Despite his issues with anesthesia induction, he has recovered nicely.  I had contemplated keeping him in the hospital however upon further observation I feel that is safe for him to go home.  Anesthesia is in agreement with this plan.  Gareld June

## 2023-09-19 NOTE — Interval H&P Note (Signed)
 History and Physical Interval Note:  09/19/2023 9:37 AM  Victor Hernandez  has presented today for surgery, with the diagnosis of ESRD.  The various methods of treatment have been discussed with the patient and family. After consideration of risks, benefits and other options for treatment, the patient has consented to  Procedure(s): ARTERIOVENOUS (AV) FISTULA CREATION (Left) INSERTION, GRAFT, ARTERIOVENOUS, UPPER EXTREMITY (Left) as a surgical intervention.  The patient's history has been reviewed, patient examined, no change in status, stable for surgery.  I have reviewed the patient's chart and labs.  Questions were answered to the patient's satisfaction.     Gareld June

## 2023-09-19 NOTE — Anesthesia Postprocedure Evaluation (Signed)
 Anesthesia Post Note  Patient: Victor Hernandez  Procedure(s) Performed: ARTERIOVENOUS (AV) FISTULA CREATION (Left: Arm Upper)     Patient location during evaluation: PACU Anesthesia Type: General Level of consciousness: awake and alert Pain management: pain level controlled Vital Signs Assessment: post-procedure vital signs reviewed and stable Respiratory status: spontaneous breathing, nonlabored ventilation, respiratory function stable and patient connected to nasal cannula oxygen Cardiovascular status: blood pressure returned to baseline and stable Postop Assessment: no apparent nausea or vomiting Anesthetic complications: no Comments: See anesthetic record for hypotension following induction.    No notable events documented.  Last Vitals:  Vitals:   09/19/23 1230 09/19/23 1245  BP: 97/78 92/75  Pulse: 78 78  Resp: 12 16  Temp:    SpO2: 97% 95%    Last Pain:  Vitals:   09/19/23 1215  TempSrc:   PainSc: 0-No pain                 Lethaniel Rave

## 2023-09-19 NOTE — H&P (View-Only) (Signed)
 Vascular and Vein Specialist of [facility]  Patient name: Victor Hernandez MRN: [id number] DOB: 04-Jun-1955 Sex: male   REQUESTING PROVIDER:   Dr. Prescilla Brod   REASON FOR CONSULT:    Dialysis access  HISTORY OF PRESENT ILLNESS:   Victor Hernandez is a 68 y.o. male, who I have been asked to evaluate for dialysis access.  He was originally seen in [place], and left sided access was recommended however this was unable to be performed due to his anesthesia needs and so transfer was recommended to [facility].  I had him scheduled several weeks ago however he required further cardiac testing before proceeding with surgery.  This has been completed and he is here today for fistula versus graft.  He has had vein mapping performed that shows a possible adequate cephalic vein on the left.  He is right-handed.  He currently dialyzes through a catheter.  The patient suffers from type 2 diabetes.  He has a significant coronary history, status post MI.  He also has a history of A-fib and V. tach.  He does have congestive heart failure with global hypokinesis.  He is medically managed for hypertension.  He is a former smoker.  PAST MEDICAL HISTORY    Past Medical History:  Diagnosis Date   Anemia of chronic renal failure    Arthritis    CAD (coronary artery disease)    a. CT chest 05/31/2020: extensive CAD; b. MV 06/06/2023: EF 41%, mod sized fixed defect in basal to mid interolat/anterolat and basal inf wall associated with hypokinesis suggestive of prior infarct in LCx territory. No ischemia.   Cardiomegaly    Cerebral hemorrhage, nontraumatic (HCC)    a. brain MRI 06/01/2020: chronic microhemorrhage in the right parietal lobe   Cerebral microvascular disease    Chronic Heart failure with mid-range ejection fraction (HCC)    a.TTE 03/13/2023: EF 45-50%, glob HK, mild LV dil, mod LVH, G3DD, GLS -14.4%, RVSP 74.3, mod-sev MR, mild-mod TR, AoV sclerosis without stenosis;  b.TTE 06/06/2023: EF 50%, mod LVH, G2DD, severe LAE, RAE/RVE,  mildly reduced RVSF, RVSP 85, triv AR/PR, sev MR   Depression    DOE (dyspnea on exertion)    ESRD (end stage renal disease) on dialysis (HCC)    Heart murmur    Hyperlipidemia    Hypertension    Insomnia    a. takes melatonin PRN   Long-term use of aspirin  therapy    NSVT (nonsustained ventricular tachycardia) (HCC)    PAF (paroxysmal atrial fibrillation) (HCC)    PONV (postoperative nausea and vomiting)    Severe mitral regurgitation    a. TTE 06/01/2020: mod-severe MR; b. TTE 03/13/2023: mod-severe MR; c. TTE 06/06/2023: severe MR   Severe pulmonary hypertension (HCC)    a. TTE 03/13/2023: RVSP 74.3 mmHg; b. TTE 06/06/2023: RSVP 85 mmHg.   T2DM (type 2 diabetes mellitus) (HCC)      FAMILY HISTORY   History reviewed. No pertinent family history.  SOCIAL HISTORY:   Social History   Socioeconomic History   Marital status: Married    Spouse name: Not on file   Number of children: Not on file   Years of education: Not on file   Highest education level: Not on file  Occupational History   Not on file  Tobacco Use   Smoking status: Former    Current packs/day: 0.00    Types: Cigarettes    Quit date: 1976    Years since quitting: 49.3   Smokeless  tobacco: Never  Vaping Use   Vaping status: Never Used  Substance and Sexual Activity   Alcohol use: No    Comment: "a beer once in awhile"   Drug use: No   Sexual activity: Not on file  Other Topics Concern   Not on file  Social History Narrative   Not on file   Social Drivers of Health   Financial Resource Strain: Not on file  Food Insecurity: No Food Insecurity (03/13/2023)   Hunger Vital Sign    Worried About Running Out of Food in the Last Year: Never true    Ran Out of Food in the Last Year: Never true  Transportation Needs: No Transportation Needs (03/13/2023)   PRAPARE - Administrator, Civil Service (Medical): No    Lack of  Transportation (Non-Medical): No  Physical Activity: Not on file  Stress: Not on file  Social Connections: Not on file  Intimate Partner Violence: Not At Risk (03/13/2023)   Humiliation, Afraid, Rape, and Kick questionnaire    Fear of Current or Ex-Partner: No    Emotionally Abused: No    Physically Abused: No    Sexually Abused: No    ALLERGIES:    No Known Allergies  CURRENT MEDICATIONS:    Current Facility-Administered Medications  Medication Dose Route Frequency Provider Last Rate Last Admin   0.9 %  sodium chloride  infusion   Intravenous Continuous Lethaniel Rave, MD       acetaminophen  (TYLENOL ) tablet 1,000 mg  1,000 mg Oral Once Lethaniel Rave, MD       ceFAZolin  (ANCEF ) IVPB 2g/100 mL premix  2 g Intravenous 30 min Pre-Op  Margherita Shell, MD       chlorhexidine  (HIBICLENS ) 4 % liquid 4 Application  60 mL Topical Once Margherita Shell, MD       And   Cecily Cohen ON 09/20/2023] chlorhexidine  (HIBICLENS ) 4 % liquid 4 Application  60 mL Topical Once Margherita Shell, MD       insulin  aspart (novoLOG ) injection 0-14 Units  0-14 Units Subcutaneous Q2H PRN Lethaniel Rave, MD   4 Units at 09/19/23 0818   sodium chloride  flush (NS) 0.9 % injection 3-10 mL  3-10 mL Intravenous PRN Margherita Shell, MD        REVIEW OF SYSTEMS:   [X]  denotes positive finding, [ ]  denotes negative finding Cardiac  Comments:  Chest pain or chest pressure:    Shortness of breath upon exertion:    Short of breath when lying flat:    Irregular heart rhythm:        Vascular    Pain in calf, thigh, or hip brought on by ambulation:    Pain in feet at night that wakes you up from your sleep:     Blood clot in your veins:    Leg swelling:         Pulmonary    Oxygen at home:    Productive cough:     Wheezing:         Neurologic    Sudden weakness in arms or legs:     Sudden numbness in arms or legs:     Sudden onset of difficulty speaking or slurred speech:    Temporary loss of vision in one eye:      Problems with dizziness:         Gastrointestinal    Blood in stool:      Vomited blood:  Genitourinary    Burning when urinating:     Blood in urine:        Psychiatric    Major depression:         Hematologic    Bleeding problems:    Problems with blood clotting too easily:        Skin    Rashes or ulcers:        Constitutional    Fever or chills:     PHYSICAL EXAM:   Vitals:   09/19/23 0733  BP: (!) 155/108  Pulse: 82  Resp: 20  Temp: 98.1 F (36.7 C)  TempSrc: Oral  SpO2: 94%  Weight: 82.6 kg  Height: 5\' 11"  (1.803 m)    GENERAL: The patient is a well-nourished male, in no acute distress. The vital signs are documented above. CARDIAC: There is a regular rate and rhythm.  VASCULAR: Palpable left radial pulse PULMONARY: Nonlabored respirations ABDOMEN: Soft and non-tender with normal pitched bowel sounds.  MUSCULOSKELETAL: There are no major deformities or cyanosis. NEUROLOGIC: No focal weakness or paresthesias are detected. SKIN: There are no ulcers or rashes noted. PSYCHIATRIC: The patient has a normal affect.  STUDIES:   I have reviewed his vein mapping that shows a 3 mm left-sided cephalic vein  ASSESSMENT and PLAN   End-stage renal disease: I discussed with the patient and his wife that we will plan on left-sided access.  Hopefully this will be able to be a fistula.  I discussed the risk of not maturity and the need for future interventions.  We discussed the risk of steal.  All of his questions were answered.   Marti Slates, MD, FACS [facility] of [facility] [phone] Pager [phone]

## 2023-09-19 NOTE — Op Note (Signed)
    Patient name: Victor Hernandez MRN: [id number] DOB: 24-Apr-1956 Sex: male  09/19/2023 Pre-operative Diagnosis: ESRD Post-operative diagnosis:  Same Surgeon:  Gareld June Assistants:  Arnaldo Betters, PA Procedure:   Left brachiocephalic fistula Anesthesia:  General Blood Loss:  minimal Specimens:  none  Findings: 5 mm disease-free brachial artery.  The cephalic vein measured over 3 mm by ultrasound and dilated to about 4 mm.  The basilic vein was a larger vein however with the anesthesia issues, I did wanted to try and avoid a second surgery.  Indications: This is a 68 year old gentleman with end-stage renal disease in need of access.  He currently dialyzes through a catheter.  He has been seen in cardiology clinic.  He was originally scheduled to have his surgery at [facility] however due to the complexities of anesthesia it was recommended this be done at [facility].  Procedure:  The patient was identified in the holding area and taken to [facility] OR ROOM 12  The patient was then placed supine on the table. general anesthesia was administered.  The patient was prepped and draped in the usual sterile fashion.  A time out was called and antibiotics were administered.  The patient had difficulty with induction of anesthesia secondary to hypotension and hypoxia.  Ultimately, he stabilized and we were cleared to proceed  PA was necessary to expedite the procedure and assist with technical details.  She helped with exposure by providing suction and retraction.  She helped with the anastomosis by following the suture.  Ultrasound was used to evaluate the surface veins in the left arm.  The basilic vein measured 5-6 mm in the cephalic vein measured 3-4 mm.  In order to avoid a second stage procedure if possible, I elected to use the cephalic vein.  A transverse incision was made just proximal to the antecubital crease.  I first dissected out the brachial artery which was a 5 mm disease-free artery.  It was encircled  with a vessel loop.  Next I exposed the cephalic vein and mobilized it throughout the width of the incision.  It was marked for orientation.  The vein was then divided distally with a 2-0 silk tie.  The vein distended nicely to 4 mm with heparinized saline.  Next the brachial artery was occluded with vascular clamps and a #11 blade was used to make an arteriotomy which was extended longitudinally with Potts scissors.  The vein was cut to the appropriate length and spatulated to fit the size of the arteriotomy.  A running anastomosis was created with 6-0 Prolene.  Prior to completion, the appropriate flushing maneuvers were performed and the anastomosis was completed.  There was an excellent thrill within the fistula and a palpable radial pulse.  I inspected the course of the vein to make sure there were no kinks or additional branches.  The wound was irrigated.  Hemostasis was achieved.  The incision was closed with 2 layers of Vicryl followed by Dermabond.  There were no immediate complications.   Disposition: To PACU stable.   [name], M.D., [facility] of [place] Office: [phone] Pager:  [phone]

## 2023-09-19 NOTE — Transfer of Care (Signed)
 Immediate Anesthesia Transfer of Care Note  Patient: Victor Hernandez  Procedure(s) Performed: ARTERIOVENOUS (AV) FISTULA CREATION (Left: Arm Upper)  Patient Location: PACU  Anesthesia Type:General  Level of Consciousness: awake, alert , and oriented  Airway & Oxygen Therapy: Patient Spontanous Breathing and Patient connected to face mask oxygen  Post-op Assessment: Post -op Vital signs reviewed and stable  Post vital signs: Reviewed and stable  Last Vitals:  Vitals Value Taken Time  BP 123/84 09/19/23 1149  Temp    Pulse 85 09/19/23 1158  Resp 10 09/19/23 1158  SpO2 99 % 09/19/23 1158  Vitals shown include unfiled device data.  Last Pain:  Vitals:   09/19/23 0815  TempSrc:   PainSc: 0-No pain         Complications: No notable events documented.

## 2023-09-19 NOTE — Anesthesia Procedure Notes (Addendum)
 Procedure Name: Intubation Date/Time: 09/19/2023 10:17 AM  Performed by: Deneice Finland, CRNAPre-anesthesia Checklist: Patient identified, Emergency Drugs available, Suction available and Patient being monitored Patient Re-evaluated:Patient Re-evaluated prior to induction Oxygen Delivery Method: Circle System Utilized Preoxygenation: Pre-oxygenation with 100% oxygen Induction Type: IV induction Ventilation: Mask ventilation without difficulty Laryngoscope Size: Mac and 3 Grade View: Grade II Tube type: Oral Number of attempts: 1 Airway Equipment and Method: Stylet and Oral airway Placement Confirmation: ETT inserted through vocal cords under direct vision, positive ETCO2 and breath sounds checked- equal and bilateral Secured at: 23 cm Tube secured with: Tape Dental Injury: Teeth and Oropharynx as per pre-operative assessment

## 2023-09-19 NOTE — Anesthesia Procedure Notes (Signed)
 Arterial Line Insertion Start/End4/23/2025 11:00 AM, 09/19/2023 11:05 AM Performed by: Lethaniel Rave, MD, anesthesiologist  Patient location: Pre-op . Preanesthetic checklist: patient identified, IV checked, site marked, risks and benefits discussed, surgical consent, monitors and equipment checked, pre-op  evaluation, timeout performed and anesthesia consent Lidocaine  1% used for infiltration Right, radial was placed Catheter size: 20 G Hand hygiene performed  and maximum sterile barriers used   Attempts: 1 Procedure performed using ultrasound guided technique. Following insertion, dressing applied. Post procedure assessment: normal and unchanged  Patient tolerated the procedure well with no immediate complications.

## 2023-09-19 NOTE — Discharge Instructions (Signed)
   [facility]  Discharge Instructions  AV Fistula or Graft Surgery for Dialysis Access  Please refer to the following instructions for your post-procedure care. Your surgeon or physician assistant will discuss any changes with you.  Activity  You may drive the day following your surgery, if you are comfortable and no longer taking prescription pain medication. Resume full activity as the soreness in your incision resolves.  Bathing/Showering  You may shower after you go home. Keep your incision dry for 48 hours. Do not soak in a bathtub, hot tub, or swim until the incision heals completely. You may not shower if you have a hemodialysis catheter.  Incision Care  Clean your incision with mild soap and water after 48 hours. Pat the area dry with a clean towel. You do not need a bandage unless otherwise instructed. Do not apply any ointments or creams to your incision. You may have skin glue on your incision. Do not peel it off. It will come off on its own in about one week. Your arm may swell a bit after surgery. To reduce swelling use pillows to elevate your arm so it is above your heart. Your doctor will tell you if you need to lightly wrap your arm with an ACE bandage.  Diet  Resume your normal diet. There are not special food restrictions following this procedure. In order to heal from your surgery, it is CRITICAL to get adequate nutrition. Your body requires vitamins, minerals, and protein. Vegetables are the best source of vitamins and minerals. Vegetables also provide the perfect balance of protein. Processed food has little nutritional value, so try to avoid this.  Medications  Resume taking all of your medications. If your incision is causing pain, you may take over-the counter pain relievers such as acetaminophen  (Tylenol ). If you were prescribed a stronger pain medication, please be aware these medications can cause nausea and constipation. Prevent  nausea by taking the medication with a snack or meal. Avoid constipation by drinking plenty of fluids and eating foods with high amount of fiber, such as fruits, vegetables, and grains.  Do not take Tylenol  if you are taking prescription pain medications.  Follow up Your surgeon may want to see you in the office following your access surgery. If so, this will be arranged at the time of your surgery.  Please call us  immediately for any of the following conditions:  Increased pain, redness, drainage (pus) from your incision site Fever of 101 degrees or higher Severe or worsening pain at your incision site Hand pain or numbness.  Reduce your risk of vascular disease:  Stop smoking. If you would like help, call [facility] at 1-800-QUIT-NOW (2817631142) or [facility] at [phone]  Manage your cholesterol Maintain a desired weight Control your diabetes Keep your blood pressure down  Dialysis  It will take several weeks to several months for your new dialysis access to be ready for use. Your surgeon will determine when it is okay to use it. Your nephrologist will continue to direct your dialysis. You can continue to use your Permcath until your new access is ready for use.   09/19/2023 Victor Hernandez [id number] November 10, 1955  Surgeon(s): Margherita Shell, MD  Procedure(s): ARTERIOVENOUS (AV) FISTULA CREATION  x Do not stick fistula for 12 weeks    If you have any questions, please call the office at [phone].

## 2023-09-20 ENCOUNTER — Encounter (HOSPITAL_COMMUNITY): Payer: Self-pay | Admitting: Surgery

## 2023-09-21 LAB — POCT I-STAT 7, (LYTES, BLD GAS, ICA,H+H)
Acid-base deficit: 5 mmol/L — ABNORMAL HIGH (ref 0.0–2.0)
Bicarbonate: 21.9 mmol/L (ref 20.0–28.0)
Calcium, Ion: 1.3 mmol/L (ref 1.15–1.40)
HCT: 42 % (ref 39.0–52.0)
Hemoglobin: 14.3 g/dL (ref 13.0–17.0)
O2 Saturation: 100 %
Potassium: 4.6 mmol/L (ref 3.5–5.1)
Sodium: 139 mmol/L (ref 135–145)
TCO2: 23 mmol/L (ref 22–32)
pCO2 arterial: 46 mmHg (ref 32–48)
pH, Arterial: 7.286 — ABNORMAL LOW (ref 7.35–7.45)
pO2, Arterial: 381 mmHg — ABNORMAL HIGH (ref 83–108)

## 2023-10-19 ENCOUNTER — Other Ambulatory Visit: Payer: Self-pay | Admitting: Physician Assistant

## 2023-10-19 DIAGNOSIS — N186 End stage renal disease: Secondary | ICD-10-CM

## 2023-11-05 ENCOUNTER — Ambulatory Visit: Attending: Surgery | Admitting: Physician Assistant

## 2023-11-05 ENCOUNTER — Ambulatory Visit (HOSPITAL_COMMUNITY)
Admission: RE | Admit: 2023-11-05 | Discharge: 2023-11-05 | Disposition: A | Discharge status: Home / Self Care | Source: Ambulatory Visit | Attending: Surgery | Admitting: Surgery

## 2023-11-05 VITALS — BP 138/96 | HR 77 | Temp 98.1°F | Ht 71.0 in | Wt 195.1 lb

## 2023-11-05 DIAGNOSIS — N186 End stage renal disease: Secondary | ICD-10-CM | POA: Diagnosis not present

## 2023-11-05 NOTE — H&P (View-Only) (Signed)
 Postoperative Access Visit   History of Present Illness   Victor Hernandez is a 68 y.o. year old male who presents for postoperative follow-up for: left brachiocephalic av fistula creation by Dr. Charlotte Cookey on 09/19/23. The patient's wounds are well healed.  The patient notes no steal symptoms.  The patient is able to complete their activities of daily living.  He currently dialyzes on TTS via a right internal jugular TDC  Physical Examination   Vitals:   11/05/23 0941  BP: (!) 138/96  Pulse: 77  Temp: 98.1 F (36.7 C)  SpO2: 91%     left arm Incision is well healed, 2+ radial pulse, hand grip is 5/5, sensation in digits is intact, palpable thrill, bruit can  be auscultated     Non invasive vascular lab:  Findings:  +--------------------+----------+-----------------+--------+  AVF                PSV (cm/s)Flow Vol (mL/min)Comments  +--------------------+----------+-----------------+--------+  Native artery inflow   113                               +--------------------+----------+-----------------+--------+  AVF Anastomosis        194          1172                 +--------------------+----------+-----------------+--------+   +---------------+----------+-------------+----------+--------+  OUTFLOW VEIN   PSV (cm/s)Diameter (cm)Depth (cm)Describe  +---------------+----------+-------------+----------+--------+  Subclavian vein    59                                     +---------------+----------+-------------+----------+--------+  Shoulder         106        0.53        0.58             +---------------+----------+-------------+----------+--------+  Prox UA            65        0.59        0.47             +---------------+----------+-------------+----------+--------+  Mid UA            475        0.73        0.33   stenotic  +---------------+----------+-------------+----------+--------+  Dist UA           146        0.59         0.31             +---------------+----------+-------------+----------+--------+    Summary:  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   Medical Decision Making   Victor Hernandez is a 68 y.o. year old male who presents s/p left brachiocephalic av fistula creation by Dr. Charlotte Cookey on 09/19/23. Left arm incision well healed. AV fistula is patent with good thrill. Duplex showing patent fistula with elevated velocity of 475 in mid fistula. Fistula also has not fully matured. Discussed exercising arm and giving it more time vs arranging fistulogram. Discussed with patient and his wife and he would like to proceed with Fistulogram Recommend Fistulogram for possible Angioplasty of mid fistula stenosis as well as balloon assisted maturation of the fistula Will arrange this in near future on non dialysis day (TTS) with Dr. Grafton Lawrence, PA-C [facility] [place] Office: [phone]  Clinic MD: Charlotte Cookey

## 2023-11-05 NOTE — Progress Notes (Signed)
 Postoperative Access Visit   History of Present Illness   Victor Hernandez is a 68 y.o. year old male who presents for postoperative follow-up for: left brachiocephalic av fistula creation by Dr. Charlotte Cookey on 09/19/23. The patient's wounds are well healed.  The patient notes no steal symptoms.  The patient is able to complete their activities of daily living.  He currently dialyzes on TTS via a right internal jugular TDC  Physical Examination   Vitals:   11/05/23 0941  BP: (!) 138/96  Pulse: 77  Temp: 98.1 F (36.7 C)  SpO2: 91%     left arm Incision is well healed, 2+ radial pulse, hand grip is 5/5, sensation in digits is intact, palpable thrill, bruit can  be auscultated     Non invasive vascular lab:  Findings:  +--------------------+----------+-----------------+--------+  AVF                PSV (cm/s)Flow Vol (mL/min)Comments  +--------------------+----------+-----------------+--------+  Native artery inflow   113                               +--------------------+----------+-----------------+--------+  AVF Anastomosis        194          1172                 +--------------------+----------+-----------------+--------+   +---------------+----------+-------------+----------+--------+  OUTFLOW VEIN   PSV (cm/s)Diameter (cm)Depth (cm)Describe  +---------------+----------+-------------+----------+--------+  Subclavian vein    59                                     +---------------+----------+-------------+----------+--------+  Shoulder         106        0.53        0.58             +---------------+----------+-------------+----------+--------+  Prox UA            65        0.59        0.47             +---------------+----------+-------------+----------+--------+  Mid UA            475        0.73        0.33   stenotic  +---------------+----------+-------------+----------+--------+  Dist UA           146        0.59         0.31             +---------------+----------+-------------+----------+--------+    Summary:  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   Medical Decision Making   Victor Hernandez is a 68 y.o. year old male who presents s/p left brachiocephalic av fistula creation by Dr. Charlotte Cookey on 09/19/23. Left arm incision well healed. AV fistula is patent with good thrill. Duplex showing patent fistula with elevated velocity of 475 in mid fistula. Fistula also has not fully matured. Discussed exercising arm and giving it more time vs arranging fistulogram. Discussed with patient and his wife and he would like to proceed with Fistulogram Recommend Fistulogram for possible Angioplasty of mid fistula stenosis as well as balloon assisted maturation of the fistula Will arrange this in near future on non dialysis day (TTS) with Dr. Grafton Lawrence, PA-C [facility] [place] Office: [phone]  Clinic MD: Charlotte Cookey

## 2023-11-14 ENCOUNTER — Other Ambulatory Visit: Payer: Self-pay

## 2023-11-14 ENCOUNTER — Observation Stay
Admission: EM | Admit: 2023-11-14 | Discharge: 2023-11-15 | Disposition: A | Discharge status: Home / Self Care | Attending: Internal Medicine | Admitting: Internal Medicine

## 2023-11-14 ENCOUNTER — Ambulatory Visit (HOSPITAL_COMMUNITY)
Admission: RE | Admit: 2023-11-14 | Discharge: 2023-11-14 | Disposition: A | Discharge status: Home / Self Care | Attending: Vascular Surgery | Admitting: Vascular Surgery

## 2023-11-14 ENCOUNTER — Encounter (HOSPITAL_COMMUNITY)
Admission: RE | Disposition: A | Discharge status: Home / Self Care | Payer: Self-pay | Source: Home / Self Care | Attending: Vascular Surgery

## 2023-11-14 ENCOUNTER — Emergency Department

## 2023-11-14 DIAGNOSIS — I482 Chronic atrial fibrillation, unspecified: Secondary | ICD-10-CM | POA: Diagnosis not present

## 2023-11-14 DIAGNOSIS — E1129 Type 2 diabetes mellitus with other diabetic kidney complication: Secondary | ICD-10-CM | POA: Diagnosis present

## 2023-11-14 DIAGNOSIS — T82898A Other specified complication of vascular prosthetic devices, implants and grafts, initial encounter: Secondary | ICD-10-CM | POA: Diagnosis present

## 2023-11-14 DIAGNOSIS — Z6825 Body mass index (BMI) 25.0-25.9, adult: Secondary | ICD-10-CM | POA: Insufficient documentation

## 2023-11-14 DIAGNOSIS — I2510 Atherosclerotic heart disease of native coronary artery without angina pectoris: Secondary | ICD-10-CM | POA: Diagnosis present

## 2023-11-14 DIAGNOSIS — I5043 Acute on chronic combined systolic (congestive) and diastolic (congestive) heart failure: Secondary | ICD-10-CM | POA: Diagnosis not present

## 2023-11-14 DIAGNOSIS — Z794 Long term (current) use of insulin: Secondary | ICD-10-CM | POA: Diagnosis not present

## 2023-11-14 DIAGNOSIS — Z7982 Long term (current) use of aspirin: Secondary | ICD-10-CM | POA: Insufficient documentation

## 2023-11-14 DIAGNOSIS — Z79899 Other long term (current) drug therapy: Secondary | ICD-10-CM | POA: Diagnosis not present

## 2023-11-14 DIAGNOSIS — N186 End stage renal disease: Secondary | ICD-10-CM

## 2023-11-14 DIAGNOSIS — E8770 Fluid overload, unspecified: Secondary | ICD-10-CM | POA: Diagnosis not present

## 2023-11-14 DIAGNOSIS — J9601 Acute respiratory failure with hypoxia: Secondary | ICD-10-CM | POA: Diagnosis present

## 2023-11-14 DIAGNOSIS — Z992 Dependence on renal dialysis: Secondary | ICD-10-CM | POA: Diagnosis not present

## 2023-11-14 DIAGNOSIS — I5042 Chronic combined systolic (congestive) and diastolic (congestive) heart failure: Secondary | ICD-10-CM | POA: Diagnosis not present

## 2023-11-14 DIAGNOSIS — Y832 Surgical operation with anastomosis, bypass or graft as the cause of abnormal reaction of the patient, or of later complication, without mention of misadventure at the time of the procedure: Secondary | ICD-10-CM | POA: Insufficient documentation

## 2023-11-14 DIAGNOSIS — I10 Essential (primary) hypertension: Secondary | ICD-10-CM | POA: Diagnosis present

## 2023-11-14 DIAGNOSIS — E663 Overweight: Secondary | ICD-10-CM | POA: Diagnosis present

## 2023-11-14 DIAGNOSIS — I132 Hypertensive heart and chronic kidney disease with heart failure and with stage 5 chronic kidney disease, or end stage renal disease: Secondary | ICD-10-CM | POA: Insufficient documentation

## 2023-11-14 DIAGNOSIS — R0602 Shortness of breath: Secondary | ICD-10-CM | POA: Diagnosis present

## 2023-11-14 DIAGNOSIS — R55 Syncope and collapse: Secondary | ICD-10-CM | POA: Diagnosis not present

## 2023-11-14 DIAGNOSIS — Z95828 Presence of other vascular implants and grafts: Secondary | ICD-10-CM

## 2023-11-14 DIAGNOSIS — J810 Acute pulmonary edema: Principal | ICD-10-CM

## 2023-11-14 HISTORY — PX: A/V SHUNT INTERVENTION: CATH118220

## 2023-11-14 LAB — COMPREHENSIVE METABOLIC PANEL WITH GFR
ALT: 22 U/L (ref 0–44)
AST: 25 U/L (ref 15–41)
Albumin: 2.9 g/dL — ABNORMAL LOW (ref 3.5–5.0)
Alkaline Phosphatase: 96 U/L (ref 38–126)
Anion gap: 14 (ref 5–15)
BUN: 63 mg/dL — ABNORMAL HIGH (ref 8–23)
CO2: 19 mmol/L — ABNORMAL LOW (ref 22–32)
Calcium: 7.7 mg/dL — ABNORMAL LOW (ref 8.9–10.3)
Chloride: 109 mmol/L (ref 98–111)
Creatinine, Ser: 9.03 mg/dL — ABNORMAL HIGH (ref 0.61–1.24)
GFR, Estimated: 6 mL/min — ABNORMAL LOW (ref >60)
Glucose, Bld: 199 mg/dL — ABNORMAL HIGH (ref 70–99)
Potassium: 4.7 mmol/L (ref 3.5–5.1)
Sodium: 142 mmol/L (ref 135–145)
Total Bilirubin: 1.2 mg/dL (ref 0.0–1.2)
Total Protein: 6.3 g/dL — ABNORMAL LOW (ref 6.5–8.1)

## 2023-11-14 LAB — CBG MONITORING, ED
Glucose-Capillary: 169 mg/dL — ABNORMAL HIGH (ref 70–99)
Glucose-Capillary: 188 mg/dL — ABNORMAL HIGH (ref 70–99)

## 2023-11-14 LAB — BRAIN NATRIURETIC PEPTIDE: B Natriuretic Peptide: 3219.6 pg/mL — ABNORMAL HIGH (ref 0.0–100.0)

## 2023-11-14 LAB — CBC
HCT: 41.6 % (ref 39.0–52.0)
Hemoglobin: 13 g/dL (ref 13.0–17.0)
MCH: 30.4 pg (ref 26.0–34.0)
MCHC: 31.3 g/dL (ref 30.0–36.0)
MCV: 97.4 fL (ref 80.0–100.0)
Platelets: 157 10*3/uL (ref 150–400)
RBC: 4.27 MIL/uL (ref 4.22–5.81)
RDW: 17.1 % — ABNORMAL HIGH (ref 11.5–15.5)
WBC: 5.5 10*3/uL (ref 4.0–10.5)
nRBC: 0 % (ref 0.0–0.2)

## 2023-11-14 LAB — TROPONIN I (HIGH SENSITIVITY): Troponin I (High Sensitivity): 6 ng/L (ref <18)

## 2023-11-14 LAB — GLUCOSE, CAPILLARY: Glucose-Capillary: 166 mg/dL — ABNORMAL HIGH (ref 70–99)

## 2023-11-14 SURGERY — A/V SHUNT INTERVENTION
Anesthesia: LOCAL

## 2023-11-14 MED ORDER — ALBUTEROL SULFATE (2.5 MG/3ML) 0.083% IN NEBU: 3.0000 mL | INHALATION_SOLUTION | RESPIRATORY_TRACT | Status: DC | PRN

## 2023-11-14 MED ORDER — INSULIN ASPART 100 UNIT/ML IJ SOLN: 0.0000 [IU] | Freq: Every day | INTRAMUSCULAR | Status: DC

## 2023-11-14 MED ORDER — HYDRALAZINE HCL 20 MG/ML IJ SOLN: 5.0000 mg | INTRAMUSCULAR | Status: DC | PRN

## 2023-11-14 MED ORDER — SLOW FE 142 (45 FE) MG PO TBCR: 1.0000 | EXTENDED_RELEASE_TABLET | Freq: Every day | ORAL | Status: DC

## 2023-11-14 MED ORDER — CALCIUM ACETATE (PHOS BINDER) 667 MG PO CAPS
1334.0000 mg | ORAL_CAPSULE | Freq: Three times a day (TID) | ORAL | Status: DC
  Filled 2023-11-14 (×3): qty 2

## 2023-11-14 MED ORDER — ATORVASTATIN CALCIUM 20 MG PO TABS
40.0000 mg | ORAL_TABLET | Freq: Every day | ORAL | Status: DC
  Administered 2023-11-14: 40 mg via ORAL
  Filled 2023-11-14: qty 2

## 2023-11-14 MED ORDER — HEPARIN (PORCINE) IN NACL 1000-0.9 UT/500ML-% IV SOLN
INTRAVENOUS | Status: DC | PRN
Start: 2023-11-14 — End: 2023-11-14
  Administered 2023-11-14: 500 mL

## 2023-11-14 MED ORDER — METOPROLOL TARTRATE 50 MG PO TABS: 50.0000 mg | ORAL_TABLET | Freq: Two times a day (BID) | ORAL | Status: DC

## 2023-11-14 MED ORDER — IODIXANOL 320 MG/ML IV SOLN
INTRAVENOUS | Status: DC | PRN
  Administered 2023-11-14: 20 mL via INTRAVENOUS

## 2023-11-14 MED ORDER — TORSEMIDE 20 MG PO TABS: 50.0000 mg | ORAL_TABLET | Freq: Every day | ORAL | Status: DC

## 2023-11-14 MED ORDER — INSULIN ASPART 100 UNIT/ML IJ SOLN: 0.0000 [IU] | Freq: Three times a day (TID) | INTRAMUSCULAR | Status: DC

## 2023-11-14 MED ORDER — FUROSEMIDE 10 MG/ML IJ SOLN
80.0000 mg | Freq: Once | INTRAMUSCULAR | Status: AC
  Administered 2023-11-14: 80 mg via INTRAVENOUS
  Filled 2023-11-14: qty 8

## 2023-11-14 MED ORDER — DOCUSATE SODIUM 100 MG PO CAPS: 100.0000 mg | ORAL_CAPSULE | Freq: Every day | ORAL | Status: DC | PRN

## 2023-11-14 MED ORDER — ASPIRIN 81 MG PO TBEC: 81.0000 mg | DELAYED_RELEASE_TABLET | Freq: Every day | ORAL | Status: DC

## 2023-11-14 MED ORDER — LIDOCAINE HCL (PF) 1 % IJ SOLN
INTRAMUSCULAR | Status: DC | PRN
  Administered 2023-11-14: 2 mL via SUBCUTANEOUS

## 2023-11-14 MED ORDER — HEPARIN SODIUM (PORCINE) 5000 UNIT/ML IJ SOLN
5000.0000 [IU] | Freq: Three times a day (TID) | INTRAMUSCULAR | Status: DC
  Administered 2023-11-14: 5000 [IU] via SUBCUTANEOUS
  Filled 2023-11-14 (×2): qty 1

## 2023-11-14 MED ORDER — ONDANSETRON HCL 4 MG/2ML IJ SOLN: 4.0000 mg | Freq: Three times a day (TID) | INTRAMUSCULAR | Status: DC | PRN

## 2023-11-14 MED ORDER — ACETAMINOPHEN 325 MG PO TABS: 650.0000 mg | ORAL_TABLET | Freq: Four times a day (QID) | ORAL | Status: DC | PRN

## 2023-11-14 MED ORDER — RENA-VITE PO TABS
1.0000 | ORAL_TABLET | Freq: Every day | ORAL | Status: DC
  Filled 2023-11-14: qty 1

## 2023-11-14 MED ORDER — DM-GUAIFENESIN ER 30-600 MG PO TB12: 1.0000 | ORAL_TABLET | Freq: Two times a day (BID) | ORAL | Status: DC | PRN

## 2023-11-14 MED ORDER — FERROUS SULFATE 325 (65 FE) MG PO TABS
325.0000 mg | ORAL_TABLET | Freq: Every day | ORAL | Status: DC
  Filled 2023-11-14: qty 1

## 2023-11-14 MED ORDER — MELATONIN 5 MG PO TABS: 5.0000 mg | ORAL_TABLET | Freq: Every evening | ORAL | Status: DC | PRN

## 2023-11-14 SURGICAL SUPPLY — 5 items
KIT MICROPUNCTURE NIT STIFF (SHEATH) IMPLANT
SHEATH PROBE COVER 6X72 (BAG) IMPLANT
STOPCOCK MORSE 400PSI 3WAY (MISCELLANEOUS) IMPLANT
TRAY PV CATH (CUSTOM PROCEDURE TRAY) ×1 IMPLANT
TUBING CIL FLEX 10 FLL-RA (TUBING) IMPLANT

## 2023-11-14 NOTE — ED Notes (Signed)
Placed patient on 2L [place].

## 2023-11-14 NOTE — Op Note (Signed)
    Patient name: Victor Hernandez MRN: [id number] DOB: [birth year] Sex: male  11/14/2023 Pre-operative Diagnosis: ESRD on HD, with slow maturation Post-operative diagnosis:  Same Surgeon:  Philipp Brawn, MD Procedure Performed:  Ultrasound-guided access of left arm aVF Fistulogram and central venogram  Indications: [name] is a 68 year old male with ESRD on HD.  He underwent brachiocephalic fistula creation on 09/19/2023.  This has been slow to mature and he has been undergoing dialysis via right IJ TDC.  His last dialysis session was yesterday.  Risks and benefits of fistulogram with possible balloon assisted maturation were reviewed, he expressed understanding and elected to proceed.  Findings:  Widely patent central venous system.  The cephalic arch appears sclerotic and that the fistula is very well collateralized to the left IJ.  There is a large branch in the midsegment of the cephalic vein fistula.  The anastomosis is widely patent   Procedure:  The patient was identified in the holding area and taken to the cath lab  The patient was then placed supine on the table and prepped and draped in the usual sterile fashion.  A time out was called.  Ultrasound was used to evaluate the left arm AV access. This was accessed under u/s guidance. An 018 wire was advanced without resistance, a micropuncture sheath was placed and fistulagram obtained which demonstrated the above findings.   Contrast: 20cc   Impression: Large branch of the mid segment of the cephalic vein fistula which will need to be ligated in the operating room. The cephalic arch appears sclerotic although the fistula is very well collateralized to the left IJ   Philipp Brawn MD [facility] [place] Office: [phone]

## 2023-11-14 NOTE — ED Notes (Signed)
 Patient p[provided with bedside Urinal. Patient denies having the urge to urinate at this time.

## 2023-11-14 NOTE — ED Provider Notes (Signed)
 [facility] Provider Note    Event Date/Time   First MD Initiated Contact with Patient 11/14/23 1750     (approximate)   History   Near Syncope   HPI  Victor Hernandez is a 68 y.o. 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  His wife found him and reports it seemed like he was so short of breath as if he was going to have a ' seizure'.  Reports he was outside for about 30 minutes.  He has been getting short of breath when he lays down  No headache no falls or injury.  No chest pain.  Reports always has some shortness of breath but it is more notable when he lays back he gets quite short of breath.  Thinks laying down for a while might have been the cause     Physical Exam   Triage Vital Signs: ED Triage Vitals  Encounter Vitals Group     BP 11/14/23 1656 106/75     Girls Systolic BP Percentile --      Girls Diastolic BP Percentile --      Boys Systolic BP Percentile --      Boys Diastolic BP Percentile --      Pulse Rate 11/14/23 1656 79     Resp 11/14/23 1656 20     Temp 11/14/23 1656 98.1 F (36.7 C)     Temp Source 11/14/23 1656 Oral     SpO2 11/14/23 1656 (!) 83 %     Weight 11/14/23 1659 180 lb (81.6 kg)     Height 11/14/23 1659 5' 11 (1.803 m)     Head Circumference --      Peak Flow --      Pain Score 11/14/23 1659 0     Pain Loc --      Pain Education --      Exclude from Growth Chart --     Most recent vital signs: Vitals:   11/14/23 1725 11/14/23 1800  BP:  114/80  Pulse: 70 66  Resp: 20 (!) 23  Temp:    SpO2: 96% 96%     General: Awake, no distress.  Mild oxygen requirement respirating comfortably on 2 L CV:  Good peripheral perfusion.  Normal tones and rate Resp:  Normal effort.  Mild slight crackles noted in the bases  bilateral.  Currently without evidence of distress.  Ports he was not coughing up with thick white sputum initially no fevers Abd:  No distention.  Soft nontender Other:  No significant edema.  Right upper chest dialysis catheter in place without surrounding drainage or edema   ED Results / Procedures / Treatments   Labs (all labs ordered are listed, but only abnormal results are displayed) Labs Reviewed  COMPREHENSIVE METABOLIC PANEL WITH GFR - Abnormal; Notable for the following components:      Result Value   CO2 19 (*)    Glucose, Bld 199 (*)    BUN 63 (*)    Creatinine, Ser 9.03 (*)    Calcium  7.7 (*)    Total Protein 6.3 (*)    Albumin 2.9 (*)    GFR, Estimated 6 (*)    All other components within normal limits  CBC - Abnormal; Notable for the following components:  RDW 17.1 (*)    All other components within normal limits  BRAIN NATRIURETIC PEPTIDE - Abnormal; Notable for the following components:   B Natriuretic Peptide 3,219.6 (*)    All other components within normal limits  CBG MONITORING, ED - Abnormal; Notable for the following components:   Glucose-Capillary 169 (*)    All other components within normal limits  URINALYSIS, ROUTINE W REFLEX MICROSCOPIC  BASIC METABOLIC PANEL WITH GFR  CBC  TROPONIN I (HIGH SENSITIVITY)     EKG  And interpreted by me at 1705 heart rate 75 QRS 90 QTc 430 Atrial fibrillation, rate controlled.  No evidence of frank ischemia.  Mild nonspecific T wave abnormality.   RADIOLOGY  Chest x-ray interpreted by me as central pulmonary vascular congestion with edema  DG Chest 2 View Result Date: 11/14/2023 CLINICAL DATA:  Hypoxia. EXAM: CHEST - 2 VIEW COMPARISON:  None Available. FINDINGS: A right-sided venous catheter is seen with its distal tip overlying the right atrium. The cardiac silhouette is enlarged and unchanged in size. Low lung volumes are noted. There is prominence of the central pulmonary vasculature. Mild atelectasis  and/or infiltrate is noted within the right lung base. No pleural effusion or pneumothorax is identified. The visualized skeletal structures are unremarkable. IMPRESSION: 1. Cardiomegaly with mild pulmonary vascular congestion. 2. Mild right basilar atelectasis and/or infiltrate. Electronically Signed   By: Virgle Grime M.D.   On: 11/14/2023 18:21         PROCEDURES:  Critical Care performed: No  Procedures   MEDICATIONS ORDERED IN ED: Medications  albuterol (PROVENTIL) (2.5 MG/3ML) 0.083% nebulizer solution 3 mL (has no administration in time range)  dextromethorphan-guaiFENesin (MUCINEX DM) 30-600 MG per 12 hr tablet 1 tablet (has no administration in time range)  ondansetron  (ZOFRAN ) injection 4 mg (has no administration in time range)  hydrALAZINE  (APRESOLINE ) injection 5 mg (has no administration in time range)  acetaminophen  (TYLENOL ) tablet 650 mg (has no administration in time range)  heparin  injection 5,000 Units (has no administration in time range)  insulin  aspart (novoLOG ) injection 0-9 Units (has no administration in time range)  insulin  aspart (novoLOG ) injection 0-5 Units (has no administration in time range)  furosemide  (LASIX ) injection 80 mg (80 mg Intravenous Given 11/14/23 1806)     IMPRESSION / MDM / ASSESSMENT AND PLAN / ED COURSE  I reviewed the triage vital signs and the nursing notes.                              Differential diagnosis includes, but is not limited to, flash pulmonary edema, volume overload, near syncope, heat exposure though seems he had a relatively low exposure to heat and his symptoms seem more positional related to having lay down for 20 minutes with developing dyspnea.  He has no acute cardiac symptoms such as chest pain his troponin is normal.  BNP is notably elevated however also renal dialysis patients.  CBC without acute departures no fever no infection signs and normal white count  Patient's presentation is most consistent  with acute presentation with potential threat to life or bodily function.   The patient is on the cardiac monitor to evaluate for evidence of arrhythmia and/or significant heart rate changes.  Patient accepted to hospital service by Dr. Rosalea Collin.  Discussed case history with Dr. Erminio Hazy of nephrology, advises nephrology full consult to follow but at this time advises treat with high-dose IV Lasix  with plan for  diuresis and hemodialysis moving forward.  Think this is reasonable at this time the patient does not have any obvious ongoing distress symptoms seem much better with sitting up to use of 2 L oxygen.  Admitted to hospitalist.  Patient understand agreeable with plan      FINAL CLINICAL IMPRESSION(S) / ED DIAGNOSES   Final diagnoses:  Flash pulmonary edema (HCC)     Rx / DC Orders   ED Discharge Orders     None        Note:  This document was prepared using Dragon voice recognition software and may include unintentional dictation errors.   Iver Marker, MD 11/14/23 [phone]

## 2023-11-14 NOTE — Interval H&P Note (Signed)
 History and Physical Interval Note:  11/14/2023 7:44 AM  Victor Hernandez  has presented today for surgery, with the diagnosis of * No pre-op  diagnosis entered *.  The various methods of treatment have been discussed with the patient and family. After consideration of risks, benefits and other options for treatment, the patient has consented to  Procedure(s): A/V SHUNT INTERVENTION (N/A) as a surgical intervention.  The patient's history has been reviewed, patient examined, no change in status, stable for surgery.  I have reviewed the patient's chart and labs.  Questions were answered to the patient's satisfaction.     Philipp Brawn

## 2023-11-14 NOTE — ED Triage Notes (Signed)
 Patient states he was outside doing yard work and became very hot and had near syncopal episode.

## 2023-11-14 NOTE — H&P (Addendum)
 History and Physical    Victor Hernandez:[id number] DOB: 08-18-1955 DOA: 11/14/2023  Referring MD/NP/PA:   PCP: Patient, No Pcp Per   Patient coming from:  The patient is coming from home.     Chief Complaint: SOB  HPI: Victor Hernandez is a 68 y.o. male with medical history significant of ESRD-HD (TTS), s/p of right internal jugular TDC placement, s/p of left AVF,  HTN,  HLD, DM, CAD, CHF with EF 45-50%, severe , severe pulmonary hypertension, NSVT, who presents with SOB.   Pt underwent brachiocephalic fistula creation on 09/19/2023, which has been slow to mature. Pt had vascular procedure  including ultrasound-guided access of left arm aVF and fistulogram and central venogram by Dr. Susi Eric of [facility] earlier this morning. After went home, he was outside doing yard work and became very hot and lightheaded with dizziness, almost passed out, but did not.  No fall, no injury.  Then he developed shortness of breath, which has been progressively worsening.  He has cough with clear mucus production, no chest pain, fever or chills.  Patient nausea, vomited once, no abdominal pain or diarrhea.  No symptoms of UTI.  No unilateral numbness or tingling in extremities.  No facial droop or slurred speech.  Patient patient normally is not using oxygen, but was found to have moderate respiratory distress, oxygen desaturated to 83% on room air, which improved to 96% on 2 L oxygen.  Findings of ultrasound-guided access of left arm aVF and fistulogram and central venogram by Dr. Susi Eric of [facility] this AM:  --Widely patent central venous system.  The cephalic arch appears sclerotic and that the fistula is very well collateralized to the left IJ.  There is a large branch in the midsegment of the cephalic vein fistula.  The anastomosis is widely patent     Data reviewed independently and ED Course: pt was found to have WBC 5.5, BNP 3219, potassium 4.7, bicarbonate 19, creatinine 9.03, BUN 63, temperature normal, blood  pressure 130/78, heart rate of 104 --> 70, RR 20.  Chest x-ray showed cardiomegaly and vascular congestion.   EKG: I have personally reviewed.  A-fib, QTc 422, LAD, poor R wave progression   Review of Systems:   General: no fevers, chills, no body weight gain, has fatigue HEENT: no blurry vision, hearing changes or sore throat Respiratory: has dyspnea, coughing, no wheezing CV: no chest pain, no palpitations GI: had nausea, vomiting, no abdominal pain, diarrhea, constipation GU: no dysuria, burning on urination, increased urinary frequency, hematuria  Ext: no leg edema Neuro: no unilateral weakness, numbness, or tingling, no vision change or hearing loss Skin: no rash, no skin tear. MSK: No muscle spasm, no deformity, no limitation of range of movement in spin Heme: No easy bruising.  Travel history: No recent long distant travel.   Allergy: No Known Allergies  Past Medical History:  Diagnosis Date   Anemia of chronic renal failure    Arthritis    CAD (coronary artery disease)    a. CT chest 05/31/2020: extensive CAD; b. MV 06/06/2023: EF 41%, mod sized fixed defect in basal to mid interolat/anterolat and basal inf wall associated with hypokinesis suggestive of prior infarct in LCx territory. No ischemia.   Cardiomegaly    Cerebral hemorrhage, nontraumatic (HCC)    a. brain MRI 06/01/2020: chronic microhemorrhage in the right parietal lobe   Cerebral microvascular disease    Chronic Heart failure with mid-range ejection fraction (HCC)    a.TTE 03/13/2023: EF 45-50%,  glob HK, mild LV dil, mod LVH, G3DD, GLS -14.4%, RVSP 74.3, mod-sev MR, mild-mod TR, AoV sclerosis without stenosis; b.TTE 06/06/2023: EF 50%, mod LVH, G2DD, severe LAE, RAE/RVE,  mildly reduced RVSF, RVSP 85, triv AR/PR, sev MR   Depression    DOE (dyspnea on exertion)    ESRD (end stage renal disease) on dialysis (HCC)    Heart murmur    Hyperlipidemia    Hypertension    Insomnia    a. takes melatonin PRN    Long-term use of aspirin  therapy    NSVT (nonsustained ventricular tachycardia) (HCC)    PAF (paroxysmal atrial fibrillation) (HCC)    PONV (postoperative nausea and vomiting)    Severe mitral regurgitation    a. TTE 06/01/2020: mod-severe MR; b. TTE 03/13/2023: mod-severe MR; c. TTE 06/06/2023: severe MR   Severe pulmonary hypertension (HCC)    a. TTE 03/13/2023: RVSP 74.3 mmHg; b. TTE 06/06/2023: RSVP 85 mmHg.   T2DM (type 2 diabetes mellitus) [facility])     Past Surgical History:  Procedure Laterality Date   AV FISTULA PLACEMENT Left 09/19/2023   Procedure: ARTERIOVENOUS (AV) FISTULA CREATION;  Surgeon: Margherita Shell, MD;  Location: [facility] OR;  Service: Vascular;  Laterality: Left;   COLONOSCOPY     COLONOSCOPY WITH PROPOFOL  N/A 05/19/2016   Procedure: COLONOSCOPY WITH PROPOFOL ;  Surgeon: Jerlean Mood, MD;  Location: [facility] ENDOSCOPY;  Service: Endoscopy;  Laterality: N/A;   DIALYSIS/PERMA CATHETER INSERTION Right 03/19/2023   Procedure: DIALYSIS/PERMA CATHETER INSERTION;  Surgeon: Celso College, MD;  Location: [facility] INVASIVE CV LAB;  Service: Cardiovascular;  Laterality: Right;   TEMPORARY DIALYSIS CATHETER N/A 03/13/2023   Procedure: TEMPORARY DIALYSIS CATHETER;  Surgeon: Jackquelyn Mass, MD;  Location: [facility] INVASIVE CV LAB;  Service: Cardiovascular;  Laterality: N/A;    Social History:  reports that he quit smoking about 49 years ago. His smoking use included cigarettes. He has never used smokeless tobacco. He reports that he does not drink alcohol and does not use drugs.  Family History:  Family History  Problem Relation Age of Onset   Heart attack Father    Diabetes Sister      Prior to Admission medications   Medication Sig Start Date End Date Taking? Authorizing Provider  acetaminophen  (TYLENOL ) 325 MG tablet Take 650 mg by mouth every 6 (six) hours as needed.    [provider]  amLODipine  (NORVASC ) 10 MG tablet Take 1 tablet (10 mg total) by mouth daily.  03/30/22 06/24/25  Jacquie Maudlin, MD  aspirin  EC 81 MG tablet Take 1 tablet (81 mg total) by mouth daily. 07/12/20   Amin, Sumayya, MD  blood glucose meter kit and supplies Dispense based on patient and insurance preference. Use up to four times daily as directed. (FOR ICD-10 E10.9, E11.9). 06/02/20   Sheril Dines, MD  calcitRIOL  (ROCALTROL ) 0.25 MCG capsule Take 1 capsule (0.25 mcg total) by mouth daily. 03/21/23   Darus Engels A, DO  calcium  acetate (PHOSLO ) 667 MG capsule Take 2 capsules (1,334 mg total) by mouth 3 (three) times daily with meals. 03/20/23   Montey Apa, DO  docusate sodium (COLACE) 100 MG capsule Take 100 mg by mouth daily as needed for mild constipation.    [provider]  Ferrous Sulfate (SLOW FE) 142 (45 Fe) MG TBCR Take 1 tablet by mouth daily.    [provider]  insulin  aspart (NOVOLOG ) 100 UNIT/ML injection Inject 4-12 Units into the skin as directed. Sliding scale 150-200 =  4 units 201-250 = 6 units 251-300 = 8 units 301-350 = 10 units 351-400 = 12 units    [provider]  melatonin 5 MG TABS Take 1 tablet (5 mg total) by mouth at bedtime. Patient taking differently: Take 5 mg by mouth at bedtime as needed. 03/20/23   Montey Apa, DO  metoprolol  tartrate (LOPRESSOR ) 50 MG tablet Take 1 tablet (50 mg total) by mouth 2 (two) times daily. 03/20/23   Montey Apa, DO  multivitamin (RENA-VIT) TABS tablet Take 1 tablet by mouth at bedtime. 03/20/23   Montey Apa, DO  oxyCODONE -acetaminophen  (PERCOCET) 5-325 MG tablet Take 1 tablet by mouth every 6 (six) hours as needed (pain). 09/19/23   Rhyne, Samantha J, PA-C  torsemide  (DEMADEX ) 100 MG tablet Take 1 tablet (100 mg total) by mouth daily. Patient taking differently: Take 50 mg by mouth daily. 07/13/20   Luna Salinas, MD    Physical Exam: Vitals:   11/14/23 1703 11/14/23 1715 11/14/23 1725 11/14/23 1800  BP:  103/78  114/80  Pulse:   70 66  Resp:   20 (!) 23   Temp:      TempSrc:      SpO2: 96%  96% 96%  Weight:      Height:       General: Has moderate acute respiratory distress HEENT:       Eyes: PERRL, EOMI, no jaundice       ENT: No discharge from the ears and nose, no pharynx injection, no tonsillar enlargement.        Neck: Has positive JVD, no bruit, no mass felt. Heme: No neck lymph node enlargement. Cardiac: S1/S2, irregularly irregular rhythm, No gallops or rubs. Respiratory: Has fine crackles bilaterally GI: Soft, nondistended, nontender, no rebound pain, no organomegaly, BS present. GU: No hematuria Ext: No pitting leg edema bilaterally. 1+DP/PT pulse bilaterally. Musculoskeletal: No joint deformities, No joint redness or warmth, no limitation of ROM in spin. Skin: No rashes.  Neuro: Alert, oriented X3, cranial nerves II-XII grossly intact, moves all extremities normally.  Psych: Patient is not psychotic, no suicidal or hemocidal ideation.  Labs on Admission: I have personally reviewed following labs and imaging studies  CBC: Recent Labs  Lab 11/14/23 1701  WBC 5.5  HGB 13.0  HCT 41.6  MCV 97.4  PLT 157   Basic Metabolic Panel: Recent Labs  Lab 11/14/23 1701  NA 142  K 4.7  CL 109  CO2 19*  GLUCOSE 199*  BUN 63*  CREATININE 9.03*  CALCIUM  7.7*   GFR: Estimated Creatinine Clearance: 8.2 mL/min (A) (by C-G formula based on SCr of 9.03 mg/dL (H)). Liver Function Tests: Recent Labs  Lab 11/14/23 1701  AST 25  ALT 22  ALKPHOS 96  BILITOT 1.2  PROT 6.3*  ALBUMIN 2.9*   No results for input(s): LIPASE, AMYLASE in the last 168 hours. No results for input(s): AMMONIA in the last 168 hours. Coagulation Profile: No results for input(s): INR, PROTIME in the last 168 hours. Cardiac Enzymes: No results for input(s): CKTOTAL, CKMB, CKMBINDEX, TROPONINI in the last 168 hours. BNP (last 3 results) No results for input(s): PROBNP in the last 8760 hours. HbA1C: No results for input(s):  HGBA1C in the last 72 hours. CBG: Recent Labs  Lab 11/14/23 0726 11/14/23 1721  GLUCAP 166* 169*   Lipid Profile: No results for input(s): CHOL, HDL, LDLCALC, TRIG, CHOLHDL, LDLDIRECT in the last 72 hours. Thyroid  Function Tests: No results for input(s): TSH,  T4TOTAL, FREET4, T3FREE, THYROIDAB in the last 72 hours. Anemia Panel: No results for input(s): VITAMINB12, FOLATE, FERRITIN, TIBC, IRON, RETICCTPCT in the last 72 hours. Urine analysis:    Component Value Date/Time   COLORURINE STRAW (A) 03/30/2022 0936   APPEARANCEUR CLEAR (A) 03/30/2022 0936   LABSPEC 1.009 03/30/2022 0936   PHURINE 6.0 03/30/2022 0936   GLUCOSEU >=500 (A) 03/30/2022 0936   HGBUR SMALL (A) 03/30/2022 0936   BILIRUBINUR NEGATIVE 03/30/2022 0936   KETONESUR NEGATIVE 03/30/2022 0936   PROTEINUR >=300 (A) 03/30/2022 0936   NITRITE NEGATIVE 03/30/2022 0936   LEUKOCYTESUR NEGATIVE 03/30/2022 0936   Sepsis Labs: @LABRCNTIP (procalcitonin:4,lacticidven:4) )No results found for this or any previous visit (from the past 240 hours).   Radiological Exams on Admission:   Assessment/Plan Principal Problem:   Fluid overload Active Problems:   Acute respiratory failure with hypoxia (HCC)   ESRD (end stage renal disease) (HCC)   Acute on chronic combined systolic and diastolic CHF (congestive heart failure) (HCC)   Near syncope   Primary hypertension   Atrial fibrillation, chronic (HCC)   CAD (coronary artery disease)   Type II diabetes mellitus with renal manifestations (HCC)   Overweight (BMI 25.0-29.9)   Assessment and Plan:  Acute respiratory failure with hypoxia due to fluid overload: Patient does not have leg edema, but has positive JVD, elevated BNP, crackles on auscultation, vascular calcium  by chest x-ray, clinically consistent with fluid overload.  Patient has 2 L new oxygen requirement, with moderate acute respiratory distress.  -Placed in telemetry bed for  patient - Give 80 mg of Lasix  -Will continue torsemide  50 mg daily - Bronchodilators and as needed Mucinex - Fluid restriction - ED physician consulted Dr. Rhesa Celeste of nephrology, will do dialysis in the morning  ESRD (end stage renal disease) (TTS): Potassium 4.7. - Consulted Dr. Rhesa Celeste of nephrology,   Acute on chronic combined systolic and diastolic CHF (congestive heart failure) (HCC): 2D echo on 03/03/2023 showed EF 45-50% with grade 3 diastolic dysfunction.  Now patient has a fluid overload -Received 80 mg of Lasix  --Will continue torsemide  50 mg daily -Volume management per renal by dialysis  Near syncope: Described as lightheadedness/dizziness, almost passed out, but did not.  No focal neurodeficit on physical examination.  No fall or injury. -Check orthostatic vital signs - Fall precaution - Frequent neurocheck  Primary hypertension: Soft blood pressure earlier with SBP in 90s, currently blood pressure 103/78 -Continue torsemide  and metoprolol  - Hold amlodipine  - IV hydralazine  as needed  Atrial fibrillation, chronic [facility]): Patient is not taking anticoagulants currently.  Heart rate 104 -> 70 -Continue metoprolol   CAD (coronary artery disease) -Aspirin   Type II diabetes mellitus with renal manifestations [facility]): Recent A1c 8.5, poorly controlled.  Patient taking NovoLog  -Sliding scale insulin   Overweight (BMI 25.0-29.9): Body weight 81.6 kg, BMI 25.10 - Encourage losing weight - Exercise healthy diet     DVT ppx: SQ Heparin      Code Status: Full code     Family Communication:     not done, no family member is at bed side.      Disposition Plan:  Anticipate discharge back to previous environment  Consults called: Dr. Rhesa Celeste of nephrology  Admission status and Level of care: Telemetry Medical:    for obs          Dispo: The patient is from: Home              Anticipated d/c is to: Home  Anticipated d/c date is: 1 day              Patient  currently is not medically stable to d/c.    Severity of Illness:  The appropriate patient status for this patient is OBSERVATION. Observation status is judged to be reasonable and necessary in order to provide the required intensity of service to ensure the patient's safety. The patient's presenting symptoms, physical exam findings, and initial radiographic and laboratory data in the context of their medical condition is felt to place them at decreased risk for further clinical deterioration. Furthermore, it is anticipated that the patient will be medically stable for discharge from the hospital within 2 midnights of admission.        Date of Service 11/14/2023    Fidencio Hue [facility]   If 7PM-7AM, please contact night-coverage [email or web address] 11/14/2023, 7:33 PM ]

## 2023-11-14 NOTE — ED Provider Triage Note (Signed)
 Emergency Medicine Provider Triage Evaluation Note  MALE MINISH , a 68 y.o. male  was evaluated in triage.  Pt complains of near syncope when he was doing yard work under the heat.  At triage patient has active coughing.  Dialysis patient next dialysis is tomorrow, patient produces urine.  Patient denies fever  Review of Systems  Positive:  Negative:  Physical Exam  BP 106/75   Pulse 79   Temp 98.1 F (36.7 C) (Oral)   Resp 20   Ht 5' 11 (1.803 m)   Wt 81.6 kg   SpO2 96%   BMI 25.10 kg/m  Gen:   Awake, no distress active coughing Resp:  Normal effort,  Bilateral expiratory wheezing MSK:   Moves extremities without difficulty  Other:  Port in the right upper chest  Medical Decision Making  Medically screening exam initiated at 5:03 PM.  Appropriate orders placed.  Estrella Hench was informed that the remainder of the evaluation will be completed by another provider, this initial triage assessment does not replace that evaluation, and the importance of remaining in the ED until their evaluation is complete.  Dialysis patient was today doing yard work and was near syncope, physical exam patient has expiratory wheezing and active cough.  Ordered CBC CMP EKG chest x-ray.  During triage saturations were 83, with oxygen saturations changed to 96%   Awilda Lennox, PA-C 11/14/23 1706

## 2023-11-15 ENCOUNTER — Encounter (HOSPITAL_COMMUNITY): Payer: Self-pay | Admitting: Vascular Surgery

## 2023-11-15 DIAGNOSIS — E8770 Fluid overload, unspecified: Secondary | ICD-10-CM | POA: Diagnosis not present

## 2023-11-15 LAB — CBC
HCT: 47.5 % (ref 39.0–52.0)
Hemoglobin: 14.6 g/dL (ref 13.0–17.0)
MCH: 30.1 pg (ref 26.0–34.0)
MCHC: 30.7 g/dL (ref 30.0–36.0)
MCV: 97.9 fL (ref 80.0–100.0)
Platelets: 154 10*3/uL (ref 150–400)
RBC: 4.85 MIL/uL (ref 4.22–5.81)
RDW: 17.2 % — ABNORMAL HIGH (ref 11.5–15.5)
WBC: 13.1 10*3/uL — ABNORMAL HIGH (ref 4.0–10.5)
nRBC: 0.2 % (ref 0.0–0.2)

## 2023-11-15 LAB — BASIC METABOLIC PANEL WITH GFR
Anion gap: 14 (ref 5–15)
Anion gap: 13 (ref 5–15)
BUN: 73 mg/dL — ABNORMAL HIGH (ref 8–23)
BUN: 74 mg/dL — ABNORMAL HIGH (ref 8–23)
CO2: 23 mmol/L (ref 22–32)
CO2: 23 mmol/L (ref 22–32)
Calcium: 8.3 mg/dL — ABNORMAL LOW (ref 8.9–10.3)
Calcium: 8.1 mg/dL — ABNORMAL LOW (ref 8.9–10.3)
Chloride: 106 mmol/L (ref 98–111)
Chloride: 106 mmol/L (ref 98–111)
Creatinine, Ser: 9.83 mg/dL — ABNORMAL HIGH (ref 0.61–1.24)
Creatinine, Ser: 9.73 mg/dL — ABNORMAL HIGH (ref 0.61–1.24)
GFR, Estimated: 5 mL/min — ABNORMAL LOW (ref >60)
GFR, Estimated: 5 mL/min — ABNORMAL LOW (ref >60)
Glucose, Bld: 175 mg/dL — ABNORMAL HIGH (ref 70–99)
Glucose, Bld: 167 mg/dL — ABNORMAL HIGH (ref 70–99)
Potassium: 6.7 mmol/L (ref 3.5–5.1)
Potassium: 6.2 mmol/L — ABNORMAL HIGH (ref 3.5–5.1)
Sodium: 143 mmol/L (ref 135–145)
Sodium: 142 mmol/L (ref 135–145)

## 2023-11-15 LAB — HEPATITIS B SURFACE ANTIGEN: Hepatitis B Surface Ag: NONREACTIVE

## 2023-11-15 LAB — CBG MONITORING, ED: Glucose-Capillary: 165 mg/dL — ABNORMAL HIGH (ref 70–99)

## 2023-11-15 MED ORDER — TORSEMIDE 10 MG PO TABS
50.0000 mg | ORAL_TABLET | Freq: Every day | ORAL | 1 refills | Status: AC
Start: 2023-11-16 — End: ?

## 2023-11-15 MED ORDER — ALBUTEROL SULFATE HFA 108 (90 BASE) MCG/ACT IN AERS: 2.0000 | INHALATION_SPRAY | Freq: Four times a day (QID) | RESPIRATORY_TRACT | 2 refills | Status: AC | PRN

## 2023-11-15 NOTE — ED Notes (Signed)
Informed RN bed assigned 

## 2023-11-15 NOTE — ED Notes (Signed)
 Breakfast tray situated for pt.

## 2023-11-15 NOTE — ED Notes (Signed)
 Dialysis consent by pt, paper consent filed with paper chart.

## 2023-11-15 NOTE — ED Notes (Signed)
 Gown changed due to it being visibly spoiled.

## 2023-11-15 NOTE — Progress Notes (Signed)
  Received patient in bed to unit.   Informed consent signed and in chart.    TX duration:3.5hrs     Transported back to ED Hand-off given to patient's nurse. No c/o and no acute distress noted    Access used: R HD catheter  Access issues: none   Total UF removed: 3.0L Medication(s) given: none Post HD VS: wnl Post HD weight: UTO  bed scale not correct      Bettye Bruins LPN [facility]

## 2023-11-15 NOTE — Progress Notes (Signed)
 [facility]  ROUNDING NOTE   Subjective:   Late entry Patient seen and evaluated earlier in the day during hemodialysis treatment. Was found to be tolerating well. Patient dialyzed against 1K bath.  Objective:  Vital signs in last 24 hours:  Temp:  [97.8 F (36.6 C)-98.8 F (37.1 C)] 97.8 F (36.6 C) (06/19 1347) Pulse Rate:  [66-96] 93 (06/19 1225) Resp:  [14-23] 20 (06/19 1225) BP: (96-131)/(69-99) 123/82 (06/19 1225) SpO2:  [83 %-100 %] 97 % (06/19 1225) Weight:  [68.9 kg-81.6 kg] 68.9 kg (06/19 0735)  Weight change:  Filed Weights   11/14/23 1659 11/15/23 0735  Weight: 81.6 kg 68.9 kg    Intake/Output: No intake/output data recorded.   Intake/Output this shift:  Total I/O In: -  Out: 3000 [Other:3000]  Physical Exam: General: No acute distress  Head: Normocephalic, atraumatic. Moist oral mucosal membranes  Neck: Supple  Lungs:  Clear to auscultation, normal effort  Heart: S1S2 no rubs  Abdomen:  Soft, nontender, bowel sounds present  Extremities: No peripheral edema.  Neurologic: Awake, alert, following commands  Skin: No acute rash  Access: Right IJ PermCath    Basic Metabolic Panel: Recent Labs  Lab 11/14/23 1701 11/15/23 0528 11/15/23 0642  NA 142 143 142  K 4.7 6.7* 6.2*  CL 109 106 106  CO2 19* 23 23  GLUCOSE 199* 175* 167*  BUN 63* 73* 74*  CREATININE 9.03* 9.83* 9.73*  CALCIUM  7.7* 8.3* 8.1*    Liver Function Tests: Recent Labs  Lab 11/14/23 1701  AST 25  ALT 22  ALKPHOS 96  BILITOT 1.2  PROT 6.3*  ALBUMIN 2.9*   No results for input(s): LIPASE, AMYLASE in the last 168 hours. No results for input(s): AMMONIA in the last 168 hours.  CBC: Recent Labs  Lab 11/14/23 1701 11/15/23 0528  WBC 5.5 13.1*  HGB 13.0 14.6  HCT 41.6 47.5  MCV 97.4 97.9  PLT 157 154    Cardiac Enzymes: No results for input(s): CKTOTAL, CKMB, CKMBINDEX, TROPONINI in the last 168 hours.  BNP: Invalid input(s):  POCBNP  CBG: Recent Labs  Lab 11/14/23 0726 11/14/23 1721 11/14/23 2139 11/15/23 0726  GLUCAP 166* 169* 188* 165*    Microbiology: Results for orders placed or performed during the hospital encounter of 07/05/20  SARS CORONAVIRUS 2 (TAT 6-24 HRS) Nasopharyngeal Nasopharyngeal Swab     Status: None   Collection Time: 07/05/20 11:21 PM   Specimen: Nasopharyngeal Swab  Result Value Ref Range Status   SARS Coronavirus 2 NEGATIVE NEGATIVE Final    Comment: (NOTE) SARS-CoV-2 target nucleic acids are NOT DETECTED.  The SARS-CoV-2 RNA is generally detectable in upper and lower respiratory specimens during the acute phase of infection. Negative results do not preclude SARS-CoV-2 infection, do not rule out co-infections with other pathogens, and should not be used as the sole basis for treatment or other patient management decisions. Negative results must be combined with clinical observations, patient history, and epidemiological information. The expected result is Negative.  Fact Sheet for Patients: HairSlick.no  Fact Sheet for Healthcare Providers: quierodirigir.com  This test is not yet approved or cleared by the [place]  FDA and  has been authorized for detection and/or diagnosis of SARS-CoV-2 by FDA under an Emergency Use Authorization (EUA). This EUA will remain  in effect (meaning this test can be used) for the duration of the COVID-19 declaration under Se ction 564(b)(1) of the Act, 21 U.S.C. section 360bbb-3(b)(1), unless the authorization is terminated or  revoked sooner.  Performed at [facility], [address], [place], [place] [address]     Coagulation Studies: No results for input(s): LABPROT, INR in the last 72 hours.  Urinalysis: No results for input(s): COLORURINE, LABSPEC, PHURINE, GLUCOSEU, HGBUR, BILIRUBINUR, KETONESUR, PROTEINUR, UROBILINOGEN, NITRITE,  LEUKOCYTESUR in the last 72 hours.  Invalid input(s): APPERANCEUR    Imaging: DG Chest 2 View Result Date: 11/14/2023 CLINICAL DATA:  Hypoxia. EXAM: CHEST - 2 VIEW COMPARISON:  None Available. FINDINGS: A right-sided venous catheter is seen with its distal tip overlying the right atrium. The cardiac silhouette is enlarged and unchanged in size. Low lung volumes are noted. There is prominence of the central pulmonary vasculature. Mild atelectasis and/or infiltrate is noted within the right lung base. No pleural effusion or pneumothorax is identified. The visualized skeletal structures are unremarkable. IMPRESSION: 1. Cardiomegaly with mild pulmonary vascular congestion. 2. Mild right basilar atelectasis and/or infiltrate. Electronically Signed   By: Virgle Grime M.D.   On: 11/14/2023 18:21   PERIPHERAL VASCULAR CATHETERIZATION Result Date: 11/14/2023 Images from the original result were not included. Patient name: Victor Hernandez MRN: [id number] DOB: Mar 28, 1956 Sex: male 11/14/2023 Pre-operative Diagnosis: ESRD on HD, with slow maturation Post-operative diagnosis:  Same Surgeon:  Philipp Brawn, MD Procedure Performed: Ultrasound-guided access of left arm aVF Fistulogram and central venogram Indications: Victor Hernandez is a 68 year old male with ESRD on HD.  He underwent brachiocephalic fistula creation on 09/19/2023.  This has been slow to mature and he has been undergoing dialysis via right IJ TDC.  His last dialysis session was yesterday.  Risks and benefits of fistulogram with possible balloon assisted maturation were reviewed, he expressed understanding and elected to proceed. Findings: Widely patent central venous system.  The cephalic arch appears sclerotic and that the fistula is very well collateralized to the left IJ.  There is a large branch in the midsegment of the cephalic vein fistula.  The anastomosis is widely patent  Procedure:  The patient was identified in the holding area and taken to the  cath lab  The patient was then placed supine on the table and prepped and draped in the usual sterile fashion.  A time out was called.  Ultrasound was used to evaluate the left arm AV access. This was accessed under u/s guidance. An 018 wire was advanced without resistance, a micropuncture sheath was placed and fistulagram obtained which demonstrated the above findings. Contrast: 20cc Impression: Large branch of the mid segment of the cephalic vein fistula which will need to be ligated in the operating room. The cephalic arch appears sclerotic although the fistula is very well collateralized to the left IJ Philipp Brawn MD [facility] Office: [phone]    Medications:     aspirin  EC  81 mg Oral Daily   atorvastatin   40 mg Oral Daily   calcium  acetate  1,334 mg Oral TID WC   ferrous sulfate  325 mg Oral Q breakfast   heparin   5,000 Units Subcutaneous Q8H   insulin  aspart  0-5 Units Subcutaneous QHS   insulin  aspart  0-9 Units Subcutaneous TID WC   metoprolol  tartrate  50 mg Oral BID   multivitamin  1 tablet Oral Daily   torsemide   50 mg Oral Daily   acetaminophen , albuterol, dextromethorphan-guaiFENesin, docusate sodium, hydrALAZINE , melatonin, ondansetron  (ZOFRAN ) IV  Assessment/ Plan:  68 y.o. male with past medical history of ESRD on HD TTS, right IJ PermCath placement, history of  left upper extremity fistula, hypertension, hyperlipidemia, diabetes mellitus type 2, coronary disease, chronic systolic heart failure ejection fraction 45 to 50%, severe pulmonary hypertension, anemia chronic kidney disease, secondary hyperparathyroidism who presented with shortness of breath.  1.  Acute respiratory failure secondary to fluid overload.  Patient did undergo hemodialysis treatment earlier in the day and we performed 3 kg of ultrafiltration.  Patient tolerated this well.  2.  Hyperkalemia.  Serum potassium quite high at 6.7.  Patient dialyzed against 1K bath.   Continue to monitor serum potassium as outpatient.  3.  Secondary hyperparathyroidism.  Maintain the patient on calcium  acetate and monitor bone metabolism parameters as an outpatient.   LOS: 0 Maddyson Keil 6/19/20254:23 PM

## 2023-11-15 NOTE — ED Notes (Signed)
 Pt reattached to cardiac leads and pulse ox.Victor Hernandez oxygen saturation read 81% on room air. Pt placed on 2L [place] and quickly returned to >90%. Call light within reach. Primary RN made aware.

## 2023-11-15 NOTE — Discharge Summary (Signed)
 Physician Discharge Summary  LISTER BRIZZI EAV:[id number] DOB: April 12, 1956 DOA: 11/14/2023  PCP: Patient, No Pcp Per  Admit date: 11/14/2023  Discharge date: 11/15/2023  Admitted From:Home  Disposition:  Home  Recommendations for Outpatient Follow-up:  Follow up with PCP in 1-2 weeks Follow-up with nephrology and continue routine hemodialysis as prior Continue on torsemide  50 mg daily as prescribed Continue other home medications as prior  Home Health: None  Equipment/Devices: None  Discharge Condition:Stable  CODE STATUS: Full  Diet recommendation: Heart Healthy/carb modified with 1200 mL fluid restriction  Brief/Interim Summary: MCCORMICK MACON is a 68 y.o. male with medical history significant of ESRD-HD (TTS), s/p of right internal jugular TDC placement, s/p of left AVF,  HTN,  HLD, DM, CAD, CHF with EF 45-50%, severe , severe pulmonary hypertension, NSVT, who presents with SOB.  Patient was admitted for acute hypoxemic respiratory failure secondary to volume overload along with acute on chronic combined systolic and diastolic CHF exacerbation.  He was given IV Lasix  and nephrology has performed hemodialysis while inpatient.  He is no longer short of breath and is able to ambulate without any symptomatic concerns or complaints noted.  He is overall in stable condition for discharge.  Discharge Diagnoses:  Principal Problem:   Fluid overload Active Problems:   Acute respiratory failure with hypoxia (HCC)   ESRD (end stage renal disease) (HCC)   Acute on chronic combined systolic and diastolic CHF (congestive heart failure) (HCC)   Near syncope   Primary hypertension   Atrial fibrillation, chronic (HCC)   CAD (coronary artery disease)   Type II diabetes mellitus with renal manifestations (HCC)   Overweight (BMI 25.0-29.9)  Principal discharge diagnosis: Acute hypoxemic respiratory failure secondary to volume overload in the setting of ESRD on HD.  Acute on chronic combined  systolic and diastolic CHF exacerbation secondary to fluid overload with near syncope.  Discharge Instructions  Discharge Instructions     Diet - low sodium heart healthy   Complete by: As directed    Increase activity slowly   Complete by: As directed       Allergies as of 11/15/2023   No Known Allergies      Medication List     TAKE these medications    acetaminophen  325 MG tablet Commonly known as: TYLENOL  Take 650 mg by mouth every 6 (six) hours as needed.   albuterol 108 (90 Base) MCG/ACT inhaler Commonly known as: VENTOLIN HFA Inhale 2 puffs into the lungs every 6 (six) hours as needed for wheezing or shortness of breath.   amLODipine  5 MG tablet Commonly known as: NORVASC  Take 5 mg by mouth 2 (two) times daily.   aspirin  EC 81 MG tablet Take 1 tablet (81 mg total) by mouth daily.   atorvastatin  40 MG tablet Commonly known as: LIPITOR Take 40 mg by mouth daily.   blood glucose meter kit and supplies Dispense based on patient and insurance preference. Use up to four times daily as directed. (FOR ICD-10 E10.9, E11.9).   calcitRIOL  0.25 MCG capsule Commonly known as: ROCALTROL  Take 1 capsule (0.25 mcg total) by mouth daily.   calcium  acetate 667 MG capsule Commonly known as: PHOSLO  Take 2 capsules (1,334 mg total) by mouth 3 (three) times daily with meals.   docusate sodium 100 MG capsule Commonly known as: COLACE Take 100 mg by mouth daily as needed for mild constipation.   insulin  aspart 100 UNIT/ML injection Commonly known as: novoLOG  Inject 4-12 Units into the skin  as directed. Sliding scale 150-200 = 4 units 201-250 = 6 units 251-300 = 8 units 301-350 = 10 units 351-400 = 12 units   melatonin 5 MG Tabs Take 1 tablet (5 mg total) by mouth at bedtime. What changed:  when to take this reasons to take this   metoprolol  tartrate 50 MG tablet Commonly known as: LOPRESSOR  Take 1 tablet (50 mg total) by mouth 2 (two) times daily.    multivitamin Tabs tablet Take 1 tablet by mouth at bedtime.   oxyCODONE -acetaminophen  5-325 MG tablet Commonly known as: Percocet Take 1 tablet by mouth every 6 (six) hours as needed (pain).   Slow Fe 142 (45 Fe) MG Tbcr Generic drug: Ferrous Sulfate Take 1 tablet by mouth daily.   torsemide  10 MG tablet Commonly known as: DEMADEX  Take 5 tablets (50 mg total) by mouth daily. Start taking on: November 16, 2023 What changed:  medication strength how much to take        No Known Allergies  Consultations: Nephrology   Procedures/Studies: DG Chest 2 View Result Date: 11/14/2023 CLINICAL DATA:  Hypoxia. EXAM: CHEST - 2 VIEW COMPARISON:  None Available. FINDINGS: A right-sided venous catheter is seen with its distal tip overlying the right atrium. The cardiac silhouette is enlarged and unchanged in size. Low lung volumes are noted. There is prominence of the central pulmonary vasculature. Mild atelectasis and/or infiltrate is noted within the right lung base. No pleural effusion or pneumothorax is identified. The visualized skeletal structures are unremarkable. IMPRESSION: 1. Cardiomegaly with mild pulmonary vascular congestion. 2. Mild right basilar atelectasis and/or infiltrate. Electronically Signed   By: Virgle Grime M.D.   On: 11/14/2023 18:21   PERIPHERAL VASCULAR CATHETERIZATION Result Date: 11/14/2023 Images from the original result were not included. Patient name: DONIVAN THAMMAVONG MRN: [id number] DOB: 1955-07-29 Sex: male 11/14/2023 Pre-operative Diagnosis: ESRD on HD, with slow maturation Post-operative diagnosis:  Same Surgeon:  Philipp Brawn, MD Procedure Performed: Ultrasound-guided access of left arm aVF Fistulogram and central venogram Indications: Mr. Weatherall is a 68 year old male with ESRD on HD.  He underwent brachiocephalic fistula creation on 09/19/2023.  This has been slow to mature and he has been undergoing dialysis via right IJ TDC.  His last dialysis session was  yesterday.  Risks and benefits of fistulogram with possible balloon assisted maturation were reviewed, he expressed understanding and elected to proceed. Findings: Widely patent central venous system.  The cephalic arch appears sclerotic and that the fistula is very well collateralized to the left IJ.  There is a large branch in the midsegment of the cephalic vein fistula.  The anastomosis is widely patent  Procedure:  The patient was identified in the holding area and taken to the cath lab  The patient was then placed supine on the table and prepped and draped in the usual sterile fashion.  A time out was called.  Ultrasound was used to evaluate the left arm AV access. This was accessed under u/s guidance. An 018 wire was advanced without resistance, a micropuncture sheath was placed and fistulagram obtained which demonstrated the above findings. Contrast: 20cc Impression: Large branch of the mid segment of the cephalic vein fistula which will need to be ligated in the operating room. The cephalic arch appears sclerotic although the fistula is very well collateralized to the left IJ Philipp Brawn MD [facility] Office: [phone]  VAS US  DUPLEX DIALYSIS ACCESS (AVF,AVG) Result Date: 11/05/2023 DIALYSIS ACCESS Patient Name:  Estrella Hench  Date of Exam:   11/05/2023 Medical Rec #: [id number]     Accession #:    [id number] Date of Birth: [birth year]     Patient Gender: M Patient Age:   42 years Exam Location:  [address] Procedure:      VAS US  DUPLEX DIALYSIS ACCESS (AVF, AVG) Referring Phys: Cordie Deters --------------------------------------------------------------------------------  Reason for Exam: AVF maturation assessment. Access Site: Left Upper Extremity. Access Type: Brachial-cephalic AVF. Performing Technologist: Estanislao Heimlich  Examination Guidelines: A complete evaluation includes B-mode imaging, spectral Doppler, color Doppler, and power Doppler as needed of all  accessible portions of each vessel. Unilateral testing is considered an integral part of a complete examination. Limited examinations for reoccurring indications may be performed as noted.  Findings: +--------------------+----------+-----------------+--------+ AVF                 PSV (cm/s)Flow Vol (mL/min)Comments +--------------------+----------+-----------------+--------+ Native artery inflow   113                              +--------------------+----------+-----------------+--------+ AVF Anastomosis        194          1172                +--------------------+----------+-----------------+--------+  +---------------+----------+-------------+----------+--------+ OUTFLOW VEIN   PSV (cm/s)Diameter (cm)Depth (cm)Describe +---------------+----------+-------------+----------+--------+ Subclavian vein    59                                    +---------------+----------+-------------+----------+--------+ Shoulder          106        0.53        0.58            +---------------+----------+-------------+----------+--------+ Prox UA            65        0.59        0.47            +---------------+----------+-------------+----------+--------+ Mid UA            475        0.73        0.33   stenotic +---------------+----------+-------------+----------+--------+ Dist UA           146        0.59        0.31            +---------------+----------+-------------+----------+--------+   Summary: 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 *See table(s) above for measurements and observations.  Diagnosing physician: Genny Kid MD Electronically signed by Genny Kid MD on 11/05/2023 at 10:02:01 AM.    --------------------------------------------------------------------------------   Final      Discharge Exam: Vitals:   11/15/23 1225 11/15/23 1347  BP: 123/82   Pulse: 93   Resp: 20   Temp: 98.5 F (36.9 C) 97.8 F (36.6 C)  SpO2: 97%    Vitals:    11/15/23 1130 11/15/23 1200 11/15/23 1225 11/15/23 1347  BP: 102/76 (!) 125/98 123/82   Pulse: 87 81 93   Resp: (!) 21 20 20    Temp:   98.5 F (36.9 C) 97.8 F (36.6 C)  TempSrc:   Oral Oral  SpO2: 95% 95% 97%   Weight:      Height:        General: Pt is alert, awake, not in acute distress  Cardiovascular: RRR, S1/S2 +, no rubs, no gallops Respiratory: CTA bilaterally, no wheezing, no rhonchi Abdominal: Soft, NT, ND, bowel sounds + Extremities: no edema, no cyanosis    The results of significant diagnostics from this hospitalization (including imaging, microbiology, ancillary and laboratory) are listed below for reference.     Microbiology: No results found for this or any previous visit (from the past 240 hours).   Labs: BNP (last 3 results) Recent Labs    03/12/23 2151 11/14/23 1701  BNP 2,517.8* 3,219.6*   Basic Metabolic Panel: Recent Labs  Lab 11/14/23 1701 11/15/23 0528 11/15/23 0642  NA 142 143 142  K 4.7 6.7* 6.2*  CL 109 106 106  CO2 19* 23 23  GLUCOSE 199* 175* 167*  BUN 63* 73* 74*  CREATININE 9.03* 9.83* 9.73*  CALCIUM  7.7* 8.3* 8.1*   Liver Function Tests: Recent Labs  Lab 11/14/23 1701  AST 25  ALT 22  ALKPHOS 96  BILITOT 1.2  PROT 6.3*  ALBUMIN 2.9*   No results for input(s): LIPASE, AMYLASE in the last 168 hours. No results for input(s): AMMONIA in the last 168 hours. CBC: Recent Labs  Lab 11/14/23 1701 11/15/23 0528  WBC 5.5 13.1*  HGB 13.0 14.6  HCT 41.6 47.5  MCV 97.4 97.9  PLT 157 154   Cardiac Enzymes: No results for input(s): CKTOTAL, CKMB, CKMBINDEX, TROPONINI in the last 168 hours. BNP: Invalid input(s): POCBNP CBG: Recent Labs  Lab 11/14/23 0726 11/14/23 1721 11/14/23 2139 11/15/23 0726  GLUCAP 166* 169* 188* 165*   D-Dimer No results for input(s): DDIMER in the last 72 hours. Hgb A1c No results for input(s): HGBA1C in the last 72 hours. Lipid Profile No results for input(s):  CHOL, HDL, LDLCALC, TRIG, CHOLHDL, LDLDIRECT in the last 72 hours. Thyroid  function studies No results for input(s): TSH, T4TOTAL, T3FREE, THYROIDAB in the last 72 hours.  Invalid input(s): FREET3 Anemia work up No results for input(s): VITAMINB12, FOLATE, FERRITIN, TIBC, IRON, RETICCTPCT in the last 72 hours. Urinalysis    Component Value Date/Time   COLORURINE STRAW (A) 03/30/2022 0936   APPEARANCEUR CLEAR (A) 03/30/2022 0936   LABSPEC 1.009 03/30/2022 0936   PHURINE 6.0 03/30/2022 0936   GLUCOSEU >=500 (A) 03/30/2022 0936   HGBUR SMALL (A) 03/30/2022 0936   BILIRUBINUR NEGATIVE 03/30/2022 0936   KETONESUR NEGATIVE 03/30/2022 0936   PROTEINUR >=300 (A) 03/30/2022 0936   NITRITE NEGATIVE 03/30/2022 0936   LEUKOCYTESUR NEGATIVE 03/30/2022 0936   Sepsis Labs Recent Labs  Lab 11/14/23 1701 11/15/23 0528  WBC 5.5 13.1*   Microbiology No results found for this or any previous visit (from the past 240 hours).   Time coordinating discharge: 35 minutes  SIGNED:   Cornelius Dill, DO [facility] 11/15/2023, 2:55 PM  If 7PM-7AM, please contact night-coverage [email or web address]

## 2023-11-16 LAB — HEPATITIS B SURFACE ANTIBODY, QUANTITATIVE: Hep B S AB Quant (Post): <3.5 m[IU]/mL — ABNORMAL LOW

## 2023-11-17 ENCOUNTER — Emergency Department
Admission: EM | Admit: 2023-11-17 | Discharge: 2023-11-17 | Disposition: A | Discharge status: Home / Self Care | Attending: Emergency Medicine | Admitting: Emergency Medicine

## 2023-11-17 DIAGNOSIS — I2510 Atherosclerotic heart disease of native coronary artery without angina pectoris: Secondary | ICD-10-CM | POA: Insufficient documentation

## 2023-11-17 DIAGNOSIS — E1122 Type 2 diabetes mellitus with diabetic chronic kidney disease: Secondary | ICD-10-CM | POA: Diagnosis not present

## 2023-11-17 DIAGNOSIS — Z992 Dependence on renal dialysis: Secondary | ICD-10-CM | POA: Insufficient documentation

## 2023-11-17 DIAGNOSIS — N186 End stage renal disease: Secondary | ICD-10-CM | POA: Diagnosis not present

## 2023-11-17 DIAGNOSIS — I509 Heart failure, unspecified: Secondary | ICD-10-CM | POA: Insufficient documentation

## 2023-11-17 DIAGNOSIS — Z789 Other specified health status: Secondary | ICD-10-CM | POA: Diagnosis present

## 2023-11-17 DIAGNOSIS — I132 Hypertensive heart and chronic kidney disease with heart failure and with stage 5 chronic kidney disease, or end stage renal disease: Secondary | ICD-10-CM | POA: Diagnosis not present

## 2023-11-17 DIAGNOSIS — Z7901 Long term (current) use of anticoagulants: Secondary | ICD-10-CM | POA: Diagnosis not present

## 2023-11-17 LAB — CBC WITH DIFFERENTIAL/PLATELET
Abs Immature Granulocytes: 0.02 10*3/uL (ref 0.00–0.07)
Basophils Absolute: 0 10*3/uL (ref 0.0–0.1)
Basophils Relative: 1 %
Eosinophils Absolute: 0.2 10*3/uL (ref 0.0–0.5)
Eosinophils Relative: 3 %
HCT: 43.2 % (ref 39.0–52.0)
Hemoglobin: 13.4 g/dL (ref 13.0–17.0)
Immature Granulocytes: 0 %
Lymphocytes Relative: 22 %
Lymphs Abs: 1.5 10*3/uL (ref 0.7–4.0)
MCH: 30.1 pg (ref 26.0–34.0)
MCHC: 31 g/dL (ref 30.0–36.0)
MCV: 97.1 fL (ref 80.0–100.0)
Monocytes Absolute: 0.9 10*3/uL (ref 0.1–1.0)
Monocytes Relative: 13 %
Neutro Abs: 4 10*3/uL (ref 1.7–7.7)
Neutrophils Relative %: 61 %
Platelets: 142 10*3/uL — ABNORMAL LOW (ref 150–400)
RBC: 4.45 MIL/uL (ref 4.22–5.81)
RDW: 16.6 % — ABNORMAL HIGH (ref 11.5–15.5)
WBC: 6.5 10*3/uL (ref 4.0–10.5)
nRBC: 0 % (ref 0.0–0.2)

## 2023-11-17 LAB — BASIC METABOLIC PANEL WITH GFR
Anion gap: 17 — ABNORMAL HIGH (ref 5–15)
BUN: 78 mg/dL — ABNORMAL HIGH (ref 8–23)
CO2: 22 mmol/L (ref 22–32)
Calcium: 8.6 mg/dL — ABNORMAL LOW (ref 8.9–10.3)
Chloride: 98 mmol/L (ref 98–111)
Creatinine, Ser: 10.3 mg/dL — ABNORMAL HIGH (ref 0.61–1.24)
GFR, Estimated: 5 mL/min — ABNORMAL LOW (ref >60)
Glucose, Bld: 295 mg/dL — ABNORMAL HIGH (ref 70–99)
Potassium: 5.1 mmol/L (ref 3.5–5.1)
Sodium: 137 mmol/L (ref 135–145)

## 2023-11-17 LAB — PROTIME-INR
INR: 1.2 (ref 0.8–1.2)
Prothrombin Time: 15.4 s — ABNORMAL HIGH (ref 11.4–15.2)

## 2023-11-17 NOTE — ED Notes (Signed)
 Pt goes to dialysis Tuesday, Thursday and Saturday.  Pt got dialysis on Thursday but missed today.

## 2023-11-17 NOTE — Discharge Instructions (Addendum)
 Your evaluation in the emergency department today was overall reassuring.  The vascular surgeon did evaluate you, and they request that you come in to the hospital main entrance early Monday morning.  Please tell them you are supposed to have a procedure with Dr. Marea of vascular surgery.  Do not eat anything that morning.  Return to the emergency department with any new or worsening symptoms.

## 2023-11-17 NOTE — ED Provider Notes (Signed)
 [facility] Provider Note    Event Date/Time   First MD Initiated Contact with Patient 11/17/23 [phone]     (approximate)   History   Vascular Access Problem  Pt reports going for dialysis this morning and dialysis stated that his vascular access was leaking and they were not able to complete session and sent him to the ED.    HPI Victor Hernandez is a 68 y.o. male PMH ESRD on dialysis (TThSat) w/ R internal jugular PermaCath + recent LUE AVF (not yet used), CHF (EF 45 to 50%), chronic A-fib not on anticoagulation, severe pulmonary hypertension, LUE AVF, DM, hypertension, hyperlipidemia, CAD resents for evaluation of reported leaking of vascular access at dialysis today - Patient has no complaints other than malfunction of catheter  Per chart review, last admitted 11/14/2023-11/15/2023 for hypoxemic respiratory failure in setting of hypervolemia and possible flash pulmonary edema.  Received Lasix  and dialysis.  Also appears patient underwent brachiocephalic fistula creation of left upper extremity on 09/19/2023.  Fistulogram and central venogram performed at 11/14/2023.  Found to be patent.  Cephalic arch sclerotic, fistula well collateralized to left IJ.  Large branch in the mid segment of the cephalic vein fistula.  No mass in the anastomosis widely patent.  Appears permacath was inserted on 03/19/2023.     Physical Exam   Triage Vital Signs: ED Triage Vitals  Encounter Vitals Group     BP 11/17/23 0653 (!) 142/100     Girls Systolic BP Percentile --      Girls Diastolic BP Percentile --      Boys Systolic BP Percentile --      Boys Diastolic BP Percentile --      Pulse Rate 11/17/23 0653 91     Resp 11/17/23 0653 20     Temp 11/17/23 0653 97.9 F (36.6 C)     Temp Source 11/17/23 0653 Oral     SpO2 11/17/23 0653 96 %     Weight 11/17/23 0654 160 lb (72.6 kg)     Height 11/17/23 0654 5' 11 (1.803 m)     Head Circumference --      Peak Flow --      Pain  Score 11/17/23 0654 0     Pain Loc --      Pain Education --      Exclude from Growth Chart --     Most recent vital signs: Vitals:   11/17/23 0737 11/17/23 1118  BP: (!) 130/92 121/69  Pulse: 95 84  Resp: 18 18  Temp: 97.9 F (36.6 C)   SpO2: 98% 100%     General: Awake, no distress.  CV:  Good peripheral perfusion. RRR, RP 2+. Permacath to R chest, no apparent leakage/bleeding. LUE AVF w/ +thrill.  Resp:  Normal effort. CTAB Abd:  No distention. Nontender to deep palpation throughout    ED Results / Procedures / Treatments   Labs (all labs ordered are listed, but only abnormal results are displayed) Labs Reviewed  CBC WITH DIFFERENTIAL/PLATELET - Abnormal; Notable for the following components:      Result Value   RDW 16.6 (*)    Platelets 142 (*)    All other components within normal limits  PROTIME-INR - Abnormal; Notable for the following components:   Prothrombin Time 15.4 (*)    All other components within normal limits  BASIC METABOLIC PANEL WITH GFR - Abnormal; Notable for the following components:   Glucose, Bld 295 (*)  BUN 78 (*)    Creatinine, Ser 10.30 (*)    Calcium  8.6 (*)    GFR, Estimated 5 (*)    Anion gap 17 (*)    All other components within normal limits     EKG  N/a   RADIOLOGY N/a    PROCEDURES:  Critical Care performed: No  Procedures   MEDICATIONS ORDERED IN ED: Medications - No data to display   IMPRESSION / MDM / ASSESSMENT AND PLAN / ED COURSE  I reviewed the triage vital signs and the nursing notes.                              DDX/MDM/AP: Differential diagnosis includes, but is not limited to, dialysis catheter malfunction.  No appreciable bleeding on my eval.  Fortunately has not missed any recent dialysis episodes, doubt underlying severe hyperkalemia or other electrolyte abnormality.  Will get basic screening labs and discussed with vascular.  Plan: - Labs  - n.p.o. - Discuss with vascular  Patient's  presentation is most consistent with acute presentation with potential threat to life or bodily function.  ED course below.  Collateral gathered from dialysis center, appears patient has a small fracture of the catheter at the arterial port.  Discussed with vascular who evaluated patient, unfortunately does not have equipment to change this today but no emergent indication for dialysis at this time, potassium normal today.  Plan for exchange on Monday morning, patient advised to come to hospital general entrance and ask for Dr. Marea of vascular surgery.  ED return precautions in place.  Patient agrees with plan.  Clinical Course as of 11/17/23 1538  Sat Nov 17, 2023  0803 D/w Dr. Tisa of vascular surgery - rec f/u w/ Dr. Pearline next week assuming no indication for emergent dialysis at this time  [MM]  0824 Cbc wnl [MM]  0951 BMP reviewed, reassuring, no hyperkalemia [MM]  1009 Called patient's [facility] facility, [phone] ([address], [place], [place]) -  [MM]  1011 Spoke w/ person who  - arterial port -- aspirated, blood seeped out of top of syringe from catheter port -- concerned this would pull air into line and seep fluid out into  - not a good seal between blood line and catheter port - chip  [MM]  1023 Reevaluation, confirmed that arterial Does have small fracture and it  Discussed with Dr. Tisa, she will discuss with dialysis unit and see if this can be replaced [MM]  1116 Dr. Tisa evaluated patient, appreciate their assistance.  Unable to change tip of dialysis catheter today.  Request patient to come to main hospital entrance on Monday morning to have vascular replace the catheter.  Will not have dialysis in the meantime.  Stable for discharge from their perspective. [MM]    Clinical Course User Index [MM] Clarine Ozell LABOR, MD     FINAL CLINICAL IMPRESSION(S) / ED DIAGNOSES   Final diagnoses:  Problem with vascular access     Rx / DC Orders   ED Discharge Orders     None         Note:  This document was prepared using Dragon voice recognition software and may include unintentional dictation errors.   Clarine Ozell LABOR, MD 11/17/23 1538

## 2023-11-17 NOTE — Consult Note (Signed)
 [facility] Vascular Consult Note  MRN : [id number]  Victor Hernandez is a 68 y.o. (May 02, 1956) male who presents with chief complaint of  Chief Complaint  Patient presents with   Vascular Access Problem  .  History of Present Illness: Patient with ESRD on HD via a right internal jugular permacath. Has LEFT brachiocephalic AV fistula- slow to mature secondary to branch noted on fistulogram performed at [facility] on 6.18. Recent hospitalization secondary to hyperkalemia and respiratory failure. Presents today from HD secondary to a cracked arterial port on the permacath and inability to complete HD.  No current facility-administered medications for this encounter.   Current Outpatient Medications  Medication Sig Dispense Refill   acetaminophen  (TYLENOL ) 325 MG tablet Take 650 mg by mouth every 6 (six) hours as needed.     albuterol  (VENTOLIN  HFA) 108 (90 Base) MCG/ACT inhaler Inhale 2 puffs into the lungs every 6 (six) hours as needed for wheezing or shortness of breath. 8 g 2   amLODipine  (NORVASC ) 5 MG tablet Take 5 mg by mouth 2 (two) times daily.     aspirin  EC 81 MG tablet Take 1 tablet (81 mg total) by mouth daily. 30 tablet 11   atorvastatin  (LIPITOR) 40 MG tablet Take 40 mg by mouth daily.     blood glucose meter kit and supplies Dispense based on patient and insurance preference. Use up to four times daily as directed. (FOR ICD-10 E10.9, E11.9). 1 each 0   calcitRIOL  (ROCALTROL ) 0.25 MCG capsule Take 1 capsule (0.25 mcg total) by mouth daily. 30 capsule 0   calcium  acetate (PHOSLO ) 667 MG capsule Take 2 capsules (1,334 mg total) by mouth 3 (three) times daily with meals. 90 capsule 0   docusate sodium  (COLACE) 100 MG capsule Take 100 mg by mouth daily as needed for mild constipation.     Ferrous Sulfate  (SLOW FE) 142 (45 Fe) MG TBCR Take 1 tablet by mouth daily.     insulin  aspart (NOVOLOG ) 100 UNIT/ML injection Inject 4-12 Units into the skin as directed. Sliding  scale 150-200 = 4 units 201-250 = 6 units 251-300 = 8 units 301-350 = 10 units 351-400 = 12 units     melatonin 5 MG TABS Take 1 tablet (5 mg total) by mouth at bedtime. (Patient taking differently: Take 5 mg by mouth at bedtime as needed.)     metoprolol  tartrate (LOPRESSOR ) 50 MG tablet Take 1 tablet (50 mg total) by mouth 2 (two) times daily. 30 tablet 1   multivitamin (RENA-VIT) TABS tablet Take 1 tablet by mouth at bedtime. 30 tablet 0   oxyCODONE -acetaminophen  (PERCOCET) 5-325 MG tablet Take 1 tablet by mouth every 6 (six) hours as needed (pain). (Patient not taking: Reported on 11/14/2023) 8 tablet 0   torsemide  (DEMADEX ) 10 MG tablet Take 5 tablets (50 mg total) by mouth daily. 30 tablet 1    Past Medical History:  Diagnosis Date   Anemia of chronic renal failure    Arthritis    CAD (coronary artery disease)    a. CT chest 05/31/2020: extensive CAD; b. MV 06/06/2023: EF 41%, mod sized fixed defect in basal to mid interolat/anterolat and basal inf wall associated with hypokinesis suggestive of prior infarct in LCx territory. No ischemia.   Cardiomegaly    Cerebral hemorrhage, nontraumatic (HCC)    a. brain MRI 06/01/2020: chronic microhemorrhage in the right parietal lobe   Cerebral microvascular disease    Chronic Heart failure with mid-range ejection fraction (  HCC)    a.TTE 03/13/2023: EF 45-50%, glob HK, mild LV dil, mod LVH, G3DD, GLS -14.4%, RVSP 74.3, mod-sev MR, mild-mod TR, AoV sclerosis without stenosis; b.TTE 06/06/2023: EF 50%, mod LVH, G2DD, severe LAE, RAE/RVE,  mildly reduced RVSF, RVSP 85, triv AR/PR, sev MR   Depression    DOE (dyspnea on exertion)    ESRD (end stage renal disease) on dialysis (HCC)    Heart murmur    Hyperlipidemia    Hypertension    Insomnia    a. takes melatonin PRN   Long-term use of aspirin  therapy    NSVT (nonsustained ventricular tachycardia) (HCC)    PAF (paroxysmal atrial fibrillation) (HCC)    PONV (postoperative nausea and  vomiting)    Severe mitral regurgitation    a. TTE 06/01/2020: mod-severe MR; b. TTE 03/13/2023: mod-severe MR; c. TTE 06/06/2023: severe MR   Severe pulmonary hypertension (HCC)    a. TTE 03/13/2023: RVSP 74.3 mmHg; b. TTE 06/06/2023: RSVP 85 mmHg.   T2DM (type 2 diabetes mellitus) (HCC)     Past Surgical History:  Procedure Laterality Date   A/V SHUNT INTERVENTION N/A 11/14/2023   Procedure: A/V SHUNT INTERVENTION;  Surgeon: Pearline Norman RAMAN, MD;  Location: [facility];  Service: Cardiovascular;  Laterality: N/A;   AV FISTULA PLACEMENT Left 09/19/2023   Procedure: ARTERIOVENOUS (AV) FISTULA CREATION;  Surgeon: Serene Gaile ORN, MD;  Location: [facility];  Service: Vascular;  Laterality: Left;   COLONOSCOPY     COLONOSCOPY WITH PROPOFOL  N/A 05/19/2016   Procedure: COLONOSCOPY WITH PROPOFOL ;  Surgeon: Louanne KANDICE Muse, MD;  Location: [facility] END[facility];  Service: Endoscopy;  Laterality: N/A;   DIALYSIS/PERMA CATHETER INSERTION Right 03/19/2023   Procedure: DIALYSIS/PERMA CATHETER INSERTION;  Surgeon: Marea Selinda RAMAN, MD;  Location: [facility];  Service: Cardiovascular;  Laterality: Right;   TEMPORARY DIALYSIS CATHETER N/A 03/13/2023   Procedure: TEMPORARY DIALYSIS CATHETER;  Surgeon: Jama Cordella KANDICE, MD;  Location: [facility];  Service: Cardiovascular;  Laterality: N/A;    Social History Social History   Tobacco Use   Smoking status: Former    Current packs/day: 0.00    Types: Cigarettes    Quit date: 1976    Years since quitting: 49.5   Smokeless tobacco: Never  Vaping Use   Vaping status: Never Used  Substance Use Topics   Alcohol use: No    Comment: a beer once in awhile   Drug use: No    Family History Family History  Problem Relation Age of Onset   Heart attack Father    Diabetes Sister     No Known Allergies   REVIEW OF SYSTEMS (Negative unless checked)  Constitutional: [] Weight loss  [] Fever  [] Chills Cardiac: [] Chest pain   [] Chest pressure    [] Palpitations   [] Shortness of breath when laying flat   [] Shortness of breath at rest   [] Shortness of breath with exertion. Vascular:  [] Pain in legs with walking   [] Pain in legs at rest   [] Pain in legs when laying flat   [] Claudication   [] Pain in feet when walking  [] Pain in feet at rest  [] Pain in feet when laying flat   [] History of DVT   [] Phlebitis   [] Swelling in legs   [] Varicose veins   [] Non-healing ulcers Pulmonary:   [] Uses home oxygen   [] Productive cough   [] Hemoptysis   [] Wheeze  [] COPD   [] Asthma Neurologic:  [] Dizziness  [] Blackouts   [] Seizures   [] History of  stroke   [] History of TIA  [] Aphasia   [] Temporary blindness   [] Dysphagia   [] Weakness or numbness in arms   [] Weakness or numbness in legs Musculoskeletal:  [] Arthritis   [] Joint swelling   [] Joint pain   [] Low back pain Hematologic:  [] Easy bruising  [] Easy bleeding   [] Hypercoagulable state   [] Anemic  [] Hepatitis Gastrointestinal:  [] Blood in stool   [] Vomiting blood  [] Gastroesophageal reflux/heartburn   [] Difficulty swallowing. Genitourinary:  [] Chronic kidney disease   [] Difficult urination  [] Frequent urination  [] Burning with urination   [] Blood in urine Skin:  [] Rashes   [] Ulcers   [] Wounds Psychological:  [] History of anxiety   []  History of major depression.  Physical Examination  Vitals:   11/17/23 0654 11/17/23 0735 11/17/23 0737 11/17/23 1118  BP:   (!) 130/92 121/69  Pulse:   95 84  Resp:   18 18  Temp:   97.9 F (36.6 C)   TempSrc:   Oral   SpO2:  98% 98% 100%  Weight: 72.6 kg     Height: 5' 11 (1.803 m)      Body mass index is 22.32 kg/m. Gen:  WD/WN, NAD Head: [place]/AT, No temporalis wasting. Prominent temp pulse not noted. Neck: Trachea midline.  No JVD.  Pulmonary:  Good air movement, respirations not labored, equal bilaterally. RIGHT internal jugular Permacath- small crack at distal portion of arterial port. No evidence of bleeding Cardiac: RRR, normal S1, S2. Gastrointestinal: soft,  non-tender/non-distended. No guarding/reflex.  Musculoskeletal: M/S 5/5 throughout.  Extremities without ischemic changes.  No deformity or atrophy. No edema.LEFT brachiocephalic AV with faint thrill and bruit, palpable radial, hand warm      CBC Lab Results  Component Value Date   WBC 6.5 11/17/2023   HGB 13.4 11/17/2023   HCT 43.2 11/17/2023   MCV 97.1 11/17/2023   PLT 142 (L) 11/17/2023    BMET    Component Value Date/Time   NA 137 11/17/2023 0856   K 5.1 11/17/2023 0856   CL 98 11/17/2023 0856   CO2 22 11/17/2023 0856   GLUCOSE 295 (H) 11/17/2023 0856   BUN 78 (H) 11/17/2023 0856   CREATININE 10.30 (H) 11/17/2023 0856   CALCIUM  8.6 (L) 11/17/2023 0856   GFRNONAA 5 (L) 11/17/2023 0856   Estimated Creatinine Clearance: 7 mL/min (A) (by C-G formula based on SCr of 10.3 mg/dL (H)).  COAG Lab Results  Component Value Date   INR 1.2 11/17/2023    Radiology DG Chest 2 View Result Date: 11/14/2023 CLINICAL DATA:  Hypoxia. EXAM: CHEST - 2 VIEW COMPARISON:  None Available. FINDINGS: A right-sided venous catheter is seen with its distal tip overlying the right atrium. The cardiac silhouette is enlarged and unchanged in size. Low lung volumes are noted. There is prominence of the central pulmonary vasculature. Mild atelectasis and/or infiltrate is noted within the right lung base. No pleural effusion or pneumothorax is identified. The visualized skeletal structures are unremarkable. IMPRESSION: 1. Cardiomegaly with mild pulmonary vascular congestion. 2. Mild right basilar atelectasis and/or infiltrate. Electronically Signed   By: Suzen Dials M.D.   On: 11/14/2023 18:21   PERIPHERAL VASCULAR CATHETERIZATION Result Date: 11/14/2023 Images from the original result were not included. Patient name: Victor Hernandez MRN: [id number] DOB: 21-Jan-1956 Sex: male 11/14/2023 Pre-operative Diagnosis: ESRD on HD, with slow maturation Post-operative diagnosis:  Same Surgeon:  Norman GORMAN Serve, MD  Procedure Performed: Ultrasound-guided access of left arm aVF Fistulogram and central venogram Indications: Victor Hernandez is a  68 year old male with ESRD on HD.  He underwent brachiocephalic fistula creation on 09/19/2023.  This has been slow to mature and he has been undergoing dialysis via right IJ TDC.  His last dialysis session was yesterday.  Risks and benefits of fistulogram with possible balloon assisted maturation were reviewed, he expressed understanding and elected to proceed. Findings: Widely patent central venous system.  The cephalic arch appears sclerotic and that the fistula is very well collateralized to the left IJ.  There is a large branch in the midsegment of the cephalic vein fistula.  The anastomosis is widely patent  Procedure:  The patient was identified in the holding area and taken to the cath lab  The patient was then placed supine on the table and prepped and draped in the usual sterile fashion.  A time out was called.  Ultrasound was used to evaluate the left arm AV access. This was accessed under u/s guidance. An 018 wire was advanced without resistance, a micropuncture sheath was placed and fistulagram obtained which demonstrated the above findings. Contrast: 20cc Impression: Large branch of the mid segment of the cephalic vein fistula which will need to be ligated in the operating room. The cephalic arch appears sclerotic although the fistula is very well collateralized to the left IJ Norman GORMAN Serve MD [facility] Office: [phone]  VAS US  DUPLEX DIALYSIS ACCESS (AVF,AVG) Result Date: 11/05/2023 DIALYSIS ACCESS Patient Name:  Victor Hernandez  Date of Exam:   11/05/2023 Medical Rec #: [id number]     Accession #:    [id number] Date of Birth: 11-09-1955     Patient Gender: M Patient Age:   43 years Exam Location:  [address] Procedure:      VAS US  DUPLEX DIALYSIS ACCESS (AVF, AVG) Referring Phys: DONNICE SENDER  --------------------------------------------------------------------------------  Reason for Exam: AVF maturation assessment. Access Site: Left Upper Extremity. Access Type: Brachial-cephalic AVF. Performing Technologist: Garnette Rockers  Examination Guidelines: A complete evaluation includes B-mode imaging, spectral Doppler, color Doppler, and power Doppler as needed of all accessible portions of each vessel. Unilateral testing is considered an integral part of a complete examination. Limited examinations for reoccurring indications may be performed as noted.  Findings: +--------------------+----------+-----------------+--------+ AVF                 PSV (cm/s)Flow Vol (mL/min)Comments +--------------------+----------+-----------------+--------+ Native artery inflow   113                              +--------------------+----------+-----------------+--------+ AVF Anastomosis        194          1172                +--------------------+----------+-----------------+--------+  +---------------+----------+-------------+----------+--------+ OUTFLOW VEIN   PSV (cm/s)Diameter (cm)Depth (cm)Describe +---------------+----------+-------------+----------+--------+ Subclavian vein    59                                    +---------------+----------+-------------+----------+--------+ Shoulder          106        0.53        0.58            +---------------+----------+-------------+----------+--------+ Prox UA            65        0.59        0.47            +---------------+----------+-------------+----------+--------+  Mid UA            475        0.73        0.33   stenotic +---------------+----------+-------------+----------+--------+ Dist UA           146        0.59        0.31            +---------------+----------+-------------+----------+--------+   Summary: 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 *See table(s) above for measurements and observations.   Diagnosing physician: Gaile New MD Electronically signed by Gaile New MD on 11/05/2023 at 10:02:01 AM.    --------------------------------------------------------------------------------   Final       Assessment/Plan 1. Unable to procure repair kit for permacath therefore-Patient to return on Monday for Permacath exchange.  Potassium and other labs normal- will resume HD on Tuesday 2. Scheduled for cephalic branch ligation at [facility] sometime next month to assit in fistula maturation    Briann Sarchet, [place] LABOR, MD  11/17/2023 11:47 AM    This note was created with Dragon medical transcription system.  Any error is purely unintentional

## 2023-11-17 NOTE — ED Triage Notes (Signed)
 Pt reports going for dialysis this morning and dialysis stated that his vascular access was leaking and they were not able to complete session and sent him to the ED.

## 2023-11-19 ENCOUNTER — Ambulatory Visit: Admission: RE | Admit: 2023-11-19 | Source: Home / Self Care | Admitting: Vascular Surgery

## 2023-11-19 ENCOUNTER — Encounter: Admission: RE | Payer: Self-pay | Source: Home / Self Care

## 2023-11-19 SURGERY — DIALYSIS/PERMA CATHETER INSERTION
Anesthesia: Moderate Sedation | Laterality: Right

## 2023-11-22 ENCOUNTER — Encounter: Payer: Self-pay | Admitting: Emergency Medicine

## 2023-11-22 ENCOUNTER — Emergency Department

## 2023-11-22 ENCOUNTER — Other Ambulatory Visit: Payer: Self-pay

## 2023-11-22 ENCOUNTER — Inpatient Hospital Stay
Admission: EM | Admit: 2023-11-22 | Discharge: 2023-11-25 | DRG: 698 | Disposition: A | Discharge status: Home Health | Attending: Internal Medicine | Admitting: Internal Medicine

## 2023-11-22 ENCOUNTER — Encounter
Admission: EM | Disposition: A | Discharge status: Home Health | Payer: Self-pay | Source: Home / Self Care | Attending: Internal Medicine

## 2023-11-22 DIAGNOSIS — N186 End stage renal disease: Secondary | ICD-10-CM | POA: Diagnosis present

## 2023-11-22 DIAGNOSIS — Z8249 Family history of ischemic heart disease and other diseases of the circulatory system: Secondary | ICD-10-CM | POA: Diagnosis not present

## 2023-11-22 DIAGNOSIS — I2510 Atherosclerotic heart disease of native coronary artery without angina pectoris: Secondary | ICD-10-CM | POA: Diagnosis present

## 2023-11-22 DIAGNOSIS — Z91158 Patient's noncompliance with renal dialysis for other reason: Secondary | ICD-10-CM

## 2023-11-22 DIAGNOSIS — N2581 Secondary hyperparathyroidism of renal origin: Secondary | ICD-10-CM | POA: Diagnosis present

## 2023-11-22 DIAGNOSIS — R0602 Shortness of breath: Secondary | ICD-10-CM | POA: Diagnosis not present

## 2023-11-22 DIAGNOSIS — R051 Acute cough: Secondary | ICD-10-CM | POA: Diagnosis present

## 2023-11-22 DIAGNOSIS — E875 Hyperkalemia: Principal | ICD-10-CM | POA: Diagnosis present

## 2023-11-22 DIAGNOSIS — Z992 Dependence on renal dialysis: Secondary | ICD-10-CM | POA: Diagnosis not present

## 2023-11-22 DIAGNOSIS — I480 Paroxysmal atrial fibrillation: Secondary | ICD-10-CM | POA: Diagnosis present

## 2023-11-22 DIAGNOSIS — W010XXA Fall on same level from slipping, tripping and stumbling without subsequent striking against object, initial encounter: Secondary | ICD-10-CM | POA: Diagnosis present

## 2023-11-22 DIAGNOSIS — G9341 Metabolic encephalopathy: Secondary | ICD-10-CM | POA: Diagnosis present

## 2023-11-22 DIAGNOSIS — Z7982 Long term (current) use of aspirin: Secondary | ICD-10-CM | POA: Diagnosis not present

## 2023-11-22 DIAGNOSIS — I340 Nonrheumatic mitral (valve) insufficiency: Secondary | ICD-10-CM | POA: Diagnosis present

## 2023-11-22 DIAGNOSIS — T8241XA Breakdown (mechanical) of vascular dialysis catheter, initial encounter: Principal | ICD-10-CM | POA: Diagnosis present

## 2023-11-22 DIAGNOSIS — I5032 Chronic diastolic (congestive) heart failure: Secondary | ICD-10-CM | POA: Diagnosis present

## 2023-11-22 DIAGNOSIS — Z794 Long term (current) use of insulin: Secondary | ICD-10-CM | POA: Diagnosis not present

## 2023-11-22 DIAGNOSIS — I132 Hypertensive heart and chronic kidney disease with heart failure and with stage 5 chronic kidney disease, or end stage renal disease: Secondary | ICD-10-CM | POA: Diagnosis present

## 2023-11-22 DIAGNOSIS — Y712 Prosthetic and other implants, materials and accessory cardiovascular devices associated with adverse incidents: Secondary | ICD-10-CM | POA: Diagnosis present

## 2023-11-22 DIAGNOSIS — Z87891 Personal history of nicotine dependence: Secondary | ICD-10-CM

## 2023-11-22 DIAGNOSIS — Z4901 Encounter for fitting and adjustment of extracorporeal dialysis catheter: Secondary | ICD-10-CM

## 2023-11-22 DIAGNOSIS — Z79899 Other long term (current) drug therapy: Secondary | ICD-10-CM

## 2023-11-22 DIAGNOSIS — E785 Hyperlipidemia, unspecified: Secondary | ICD-10-CM | POA: Diagnosis present

## 2023-11-22 DIAGNOSIS — E1165 Type 2 diabetes mellitus with hyperglycemia: Secondary | ICD-10-CM | POA: Diagnosis present

## 2023-11-22 DIAGNOSIS — E1122 Type 2 diabetes mellitus with diabetic chronic kidney disease: Secondary | ICD-10-CM | POA: Diagnosis present

## 2023-11-22 DIAGNOSIS — T8249XA Other complication of vascular dialysis catheter, initial encounter: Secondary | ICD-10-CM | POA: Diagnosis not present

## 2023-11-22 DIAGNOSIS — D631 Anemia in chronic kidney disease: Secondary | ICD-10-CM | POA: Diagnosis present

## 2023-11-22 DIAGNOSIS — I272 Pulmonary hypertension, unspecified: Secondary | ICD-10-CM | POA: Diagnosis present

## 2023-11-22 HISTORY — PX: DIALYSIS/PERMA CATHETER REPAIR: CATH118293

## 2023-11-22 LAB — HEMOGLOBIN A1C
Hgb A1c MFr Bld: 10.7 % — ABNORMAL HIGH (ref 4.8–5.6)
Mean Plasma Glucose: 260.39 mg/dL

## 2023-11-22 LAB — BRAIN NATRIURETIC PEPTIDE: B Natriuretic Peptide: 3214.5 pg/mL — ABNORMAL HIGH (ref 0.0–100.0)

## 2023-11-22 LAB — CBC
HCT: 44.5 % (ref 39.0–52.0)
Hemoglobin: 14.3 g/dL (ref 13.0–17.0)
MCH: 29.7 pg (ref 26.0–34.0)
MCHC: 32.1 g/dL (ref 30.0–36.0)
MCV: 92.5 fL (ref 80.0–100.0)
Platelets: 194 10*3/uL (ref 150–400)
RBC: 4.81 MIL/uL (ref 4.22–5.81)
RDW: 16.3 % — ABNORMAL HIGH (ref 11.5–15.5)
WBC: 6 10*3/uL (ref 4.0–10.5)
nRBC: 0 % (ref 0.0–0.2)

## 2023-11-22 LAB — RESP PANEL BY RT-PCR (RSV, FLU A&B, COVID)  RVPGX2
Influenza A by PCR: NEGATIVE
Influenza B by PCR: NEGATIVE
Resp Syncytial Virus by PCR: NEGATIVE
SARS Coronavirus 2 by RT PCR: NEGATIVE

## 2023-11-22 LAB — COMPREHENSIVE METABOLIC PANEL WITH GFR
ALT: 33 U/L (ref 0–44)
AST: 22 U/L (ref 15–41)
Albumin: 3.2 g/dL — ABNORMAL LOW (ref 3.5–5.0)
Alkaline Phosphatase: 90 U/L (ref 38–126)
Anion gap: 20 — ABNORMAL HIGH (ref 5–15)
BUN: 133 mg/dL — ABNORMAL HIGH (ref 8–23)
CO2: 20 mmol/L — ABNORMAL LOW (ref 22–32)
Calcium: 8.7 mg/dL — ABNORMAL LOW (ref 8.9–10.3)
Chloride: 102 mmol/L (ref 98–111)
Creatinine, Ser: 16.3 mg/dL — ABNORMAL HIGH (ref 0.61–1.24)
GFR, Estimated: 3 mL/min — ABNORMAL LOW (ref >60)
Glucose, Bld: 131 mg/dL — ABNORMAL HIGH (ref 70–99)
Potassium: 6.6 mmol/L (ref 3.5–5.1)
Sodium: 142 mmol/L (ref 135–145)
Total Bilirubin: 1.4 mg/dL — ABNORMAL HIGH (ref 0.0–1.2)
Total Protein: 6.8 g/dL (ref 6.5–8.1)

## 2023-11-22 LAB — TROPONIN I (HIGH SENSITIVITY)
Troponin I (High Sensitivity): 6 ng/L (ref <18)
Troponin I (High Sensitivity): 16 ng/L (ref <18)

## 2023-11-22 LAB — CBG MONITORING, ED: Glucose-Capillary: 143 mg/dL — ABNORMAL HIGH (ref 70–99)

## 2023-11-22 LAB — GLUCOSE, CAPILLARY: Glucose-Capillary: 109 mg/dL — ABNORMAL HIGH (ref 70–99)

## 2023-11-22 SURGERY — DIALYSIS/PERMA CATHETER REPAIR
Anesthesia: LOCAL | Laterality: Right

## 2023-11-22 MED ORDER — ALBUTEROL SULFATE (2.5 MG/3ML) 0.083% IN NEBU: 3.0000 mL | INHALATION_SOLUTION | Freq: Four times a day (QID) | RESPIRATORY_TRACT | Status: DC | PRN

## 2023-11-22 MED ORDER — LIDOCAINE-EPINEPHRINE (PF) 1 %-1:200000 IJ SOLN
INTRAMUSCULAR | Status: DC | PRN
  Administered 2023-11-22: 20 mL via INTRADERMAL

## 2023-11-22 MED ORDER — CEFAZOLIN SODIUM-DEXTROSE 1-4 GM/50ML-% IV SOLN
INTRAVENOUS | Status: AC
  Filled 2023-11-22: qty 50

## 2023-11-22 MED ORDER — CEFAZOLIN SODIUM-DEXTROSE 2-4 GM/100ML-% IV SOLN
2.0000 g | INTRAVENOUS | Status: DC
Start: 2023-11-22 — End: 2023-11-22

## 2023-11-22 MED ORDER — FAMOTIDINE 20 MG PO TABS: 40.0000 mg | ORAL_TABLET | Freq: Once | ORAL | Status: DC | PRN

## 2023-11-22 MED ORDER — SODIUM ZIRCONIUM CYCLOSILICATE 10 G PO PACK
10.0000 g | PACK | Freq: Once | ORAL | Status: AC
  Administered 2023-11-22: 10 g via ORAL
  Filled 2023-11-22: qty 1

## 2023-11-22 MED ORDER — CALCIUM GLUCONATE-NACL 1-0.675 GM/50ML-% IV SOLN
1.0000 g | Freq: Once | INTRAVENOUS | Status: AC
  Administered 2023-11-22: 1000 mg via INTRAVENOUS
  Filled 2023-11-22: qty 50

## 2023-11-22 MED ORDER — HEPARIN SODIUM (PORCINE) 10000 UNIT/ML IJ SOLN
INTRAMUSCULAR | Status: DC | PRN
  Administered 2023-11-22: 10000 [IU]

## 2023-11-22 MED ORDER — INSULIN ASPART 100 UNIT/ML IJ SOLN
5.0000 [IU] | Freq: Once | INTRAMUSCULAR | Status: AC
  Administered 2023-11-22: 5 [IU] via INTRAVENOUS
  Filled 2023-11-22: qty 1

## 2023-11-22 MED ORDER — METHYLPREDNISOLONE SODIUM SUCC 125 MG IJ SOLR: 125.0000 mg | Freq: Once | INTRAMUSCULAR | Status: DC | PRN

## 2023-11-22 MED ORDER — AMLODIPINE BESYLATE 10 MG PO TABS
10.0000 mg | ORAL_TABLET | Freq: Every evening | ORAL | Status: DC
  Filled 2023-11-22: qty 1

## 2023-11-22 MED ORDER — INSULIN ASPART 100 UNIT/ML IJ SOLN
0.0000 [IU] | Freq: Three times a day (TID) | INTRAMUSCULAR | Status: DC
  Administered 2023-11-23: 1 [IU] via SUBCUTANEOUS
  Administered 2023-11-24: 3 [IU] via SUBCUTANEOUS
  Administered 2023-11-25 (×2): 1 [IU] via SUBCUTANEOUS
  Filled 2023-11-22 (×3): qty 1

## 2023-11-22 MED ORDER — MIDAZOLAM HCL 2 MG/2ML IJ SOLN
INTRAMUSCULAR | Status: DC | PRN
  Administered 2023-11-22: 2 mg via INTRAVENOUS

## 2023-11-22 MED ORDER — MELATONIN 5 MG PO TABS
5.0000 mg | ORAL_TABLET | Freq: Every day | ORAL | Status: DC
  Administered 2023-11-22 – 2023-11-24 (×3): 5 mg via ORAL
  Filled 2023-11-22 (×3): qty 1

## 2023-11-22 MED ORDER — ATORVASTATIN CALCIUM 20 MG PO TABS
40.0000 mg | ORAL_TABLET | Freq: Every day | ORAL | Status: DC
  Administered 2023-11-23 – 2023-11-25 (×3): 40 mg via ORAL
  Filled 2023-11-22 (×3): qty 2

## 2023-11-22 MED ORDER — DOCUSATE SODIUM 100 MG PO CAPS: 100.0000 mg | ORAL_CAPSULE | Freq: Every day | ORAL | Status: DC | PRN

## 2023-11-22 MED ORDER — AMLODIPINE BESYLATE 5 MG PO TABS
5.0000 mg | ORAL_TABLET | Freq: Two times a day (BID) | ORAL | Status: DC
  Administered 2023-11-22 – 2023-11-25 (×5): 5 mg via ORAL
  Filled 2023-11-22 (×5): qty 1

## 2023-11-22 MED ORDER — MIDAZOLAM HCL 2 MG/2ML IJ SOLN
INTRAMUSCULAR | Status: AC
  Filled 2023-11-22: qty 2

## 2023-11-22 MED ORDER — DEXTROSE 50 % IV SOLN
1.0000 | Freq: Once | INTRAVENOUS | Status: AC
  Administered 2023-11-22: 50 mL via INTRAVENOUS
  Filled 2023-11-22: qty 50

## 2023-11-22 MED ORDER — CALCITRIOL 0.25 MCG PO CAPS
0.2500 ug | ORAL_CAPSULE | Freq: Every day | ORAL | Status: DC
  Administered 2023-11-22 – 2023-11-25 (×4): 0.25 ug via ORAL
  Filled 2023-11-22 (×4): qty 1

## 2023-11-22 MED ORDER — SODIUM CHLORIDE 0.9 % IV SOLN: INTRAVENOUS | Status: DC

## 2023-11-22 MED ORDER — METOPROLOL TARTRATE 50 MG PO TABS
50.0000 mg | ORAL_TABLET | Freq: Two times a day (BID) | ORAL | Status: DC
  Administered 2023-11-22 – 2023-11-25 (×5): 50 mg via ORAL
  Filled 2023-11-22 (×5): qty 1

## 2023-11-22 MED ORDER — CHLORHEXIDINE GLUCONATE CLOTH 2 % EX PADS
6.0000 | MEDICATED_PAD | Freq: Every day | CUTANEOUS | Status: DC
  Administered 2023-11-23 – 2023-11-25 (×3): 6 via TOPICAL
  Filled 2023-11-22: qty 6

## 2023-11-22 MED ORDER — INSULIN ASPART 100 UNIT/ML IJ SOLN: 4.0000 [IU] | INTRAMUSCULAR | Status: DC

## 2023-11-22 MED ORDER — TORSEMIDE 20 MG PO TABS
50.0000 mg | ORAL_TABLET | Freq: Every day | ORAL | Status: DC
  Administered 2023-11-22 – 2023-11-25 (×3): 50 mg via ORAL
  Filled 2023-11-22 (×3): qty 3

## 2023-11-22 MED ORDER — FERROUS SULFATE 75 (15 FE) MG/ML PO SOLN
225.0000 mg | Freq: Every day | ORAL | Status: DC
  Administered 2023-11-22 – 2023-11-25 (×4): 225 mg via ORAL
  Filled 2023-11-22 (×4): qty 3

## 2023-11-22 MED ORDER — MIDAZOLAM HCL 2 MG/ML PO SYRP: 8.0000 mg | ORAL_SOLUTION | Freq: Once | ORAL | Status: DC | PRN

## 2023-11-22 MED ORDER — CALCIUM ACETATE (PHOS BINDER) 667 MG PO CAPS
1334.0000 mg | ORAL_CAPSULE | Freq: Three times a day (TID) | ORAL | Status: DC
  Administered 2023-11-23 – 2023-11-25 (×7): 1334 mg via ORAL
  Filled 2023-11-22 (×7): qty 2

## 2023-11-22 MED ORDER — DIPHENHYDRAMINE HCL 50 MG/ML IJ SOLN: 50.0000 mg | Freq: Once | INTRAMUSCULAR | Status: DC | PRN

## 2023-11-22 SURGICAL SUPPLY — 6 items
BIOPATCH RED 1 DISK 7.0 (GAUZE/BANDAGES/DRESSINGS) IMPLANT
CATH PALINDROME-P 19CM W/VT (CATHETERS) IMPLANT
GUIDEWIRE SUPER STIFF .035X180 (WIRE) IMPLANT
PACK ANGIOGRAPHY (CUSTOM PROCEDURE TRAY) IMPLANT
SUT MNCRL AB 4-0 PS2 18 (SUTURE) IMPLANT
SUT PROLENE 0 CT 1 30 (SUTURE) IMPLANT

## 2023-11-22 NOTE — Consult Note (Signed)
 [facility] Vascular Consult Note  MRN : [id number]  Victor Hernandez is a 68 y.o. (06-25-1955) male who presents with chief complaint of  Chief Complaint  Patient presents with   Vascular Access Problem   needs dialysis   Fall  .  History of Present Illness: Patient with ESRD on HD via a right internal jugular permacath. Has LEFT brachiocephalic AV fistula- slow to mature secondary to branch noted on fistulogram performed at [facility] on 6.18. Recent hospitalization secondary to hyperkalemia and respiratory failure. Presents today from HD secondary to a cracked arterial port on the permacath and inability to complete HD.  Current Facility-Administered Medications  Medication Dose Route Frequency Provider Last Rate Last Admin   calcium  gluconate 1 g/ 50 mL sodium chloride  IVPB  1 g Intravenous Once Tan, Lorelle Cummins, MD       Chlorhexidine  Gluconate Cloth 2 % PADS 6 each  6 each Topical Q0600 Druscilla Bald, NP       insulin  aspart (novoLOG ) injection 5 Units  5 Units Intravenous Once Tan, Lorelle Cummins, MD       And   dextrose  50 % solution 50 mL  1 ampule Intravenous Once Tan, Lorelle Cummins, MD       sodium zirconium cyclosilicate (LOKELMA) packet 10 g  10 g Oral Once Tan, Ting Xu, MD       Current Outpatient Medications  Medication Sig Dispense Refill   acetaminophen  (TYLENOL ) 325 MG tablet Take 650 mg by mouth every 6 (six) hours as needed.     albuterol  (VENTOLIN  HFA) 108 (90 Base) MCG/ACT inhaler Inhale 2 puffs into the lungs every 6 (six) hours as needed for wheezing or shortness of breath. 8 g 2   amLODipine  (NORVASC ) 5 MG tablet Take 5 mg by mouth 2 (two) times daily.     aspirin  EC 81 MG tablet Take 1 tablet (81 mg total) by mouth daily. 30 tablet 11   atorvastatin  (LIPITOR) 40 MG tablet Take 40 mg by mouth daily.     blood glucose meter kit and supplies Dispense based on patient and insurance preference. Use up to four times daily as directed. (FOR ICD-10 E10.9, E11.9). 1  each 0   calcitRIOL  (ROCALTROL ) 0.25 MCG capsule Take 1 capsule (0.25 mcg total) by mouth daily. 30 capsule 0   calcium  acetate (PHOSLO ) 667 MG capsule Take 2 capsules (1,334 mg total) by mouth 3 (three) times daily with meals. 90 capsule 0   docusate sodium  (COLACE) 100 MG capsule Take 100 mg by mouth daily as needed for mild constipation.     Ferrous Sulfate  (SLOW FE) 142 (45 Fe) MG TBCR Take 1 tablet by mouth daily.     insulin  aspart (NOVOLOG ) 100 UNIT/ML injection Inject 4-12 Units into the skin as directed. Sliding scale 150-200 = 4 units 201-250 = 6 units 251-300 = 8 units 301-350 = 10 units 351-400 = 12 units     melatonin 5 MG TABS Take 1 tablet (5 mg total) by mouth at bedtime. (Patient taking differently: Take 5 mg by mouth at bedtime as needed.)     metoprolol  tartrate (LOPRESSOR ) 50 MG tablet Take 1 tablet (50 mg total) by mouth 2 (two) times daily. 30 tablet 1   multivitamin (RENA-VIT) TABS tablet Take 1 tablet by mouth at bedtime. 30 tablet 0   oxyCODONE -acetaminophen  (PERCOCET) 5-325 MG tablet Take 1 tablet by mouth every 6 (six) hours as needed (pain). (Patient not taking: Reported on 11/14/2023) 8  tablet 0   torsemide  (DEMADEX ) 10 MG tablet Take 5 tablets (50 mg total) by mouth daily. 30 tablet 1    Past Medical History:  Diagnosis Date   Anemia of chronic renal failure    Arthritis    CAD (coronary artery disease)    a. CT chest 05/31/2020: extensive CAD; b. MV 06/06/2023: EF 41%, mod sized fixed defect in basal to mid interolat/anterolat and basal inf wall associated with hypokinesis suggestive of prior infarct in LCx territory. No ischemia.   Cardiomegaly    Cerebral hemorrhage, nontraumatic (HCC)    a. 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 takes melatonin PRN   Long-term use of aspirin  therapy    NSVT (nonsustained ventricular tachycardia) (HCC)    PAF (paroxysmal atrial fibrillation) (HCC)    PONV (postoperative nausea and vomiting)    Severe mitral regurgitation    a. TTE 06/01/2020: mod-severe MR; b. TTE 03/13/2023: mod-severe MR; c. TTE 06/06/2023: severe MR   Severe pulmonary hypertension (HCC)    a. TTE 03/13/2023: RVSP 74.3 mmHg; b. TTE 06/06/2023: RSVP 85 mmHg.   T2DM (type 2 diabetes mellitus) (HCC)     Past Surgical History:  Procedure Laterality Date   A/V SHUNT INTERVENTION N/A 11/14/2023   Procedure: A/V SHUNT INTERVENTION;  Surgeon: Pearline Norman RAMAN, MD;  Location: [facility];  Service: Cardiovascular;  Laterality: N/A;   AV FISTULA PLACEMENT Left 09/19/2023   Procedure: ARTERIOVENOUS (AV) FISTULA CREATION;  Surgeon: Serene Gaile ORN, MD;  Location: [facility];  Service: Vascular;  Laterality: Left;   COLONOSCOPY     COLONOSCOPY WITH PROPOFOL  N/A 05/19/2016   Procedure: COLONOSCOPY WITH PROPOFOL ;  Surgeon: Louanne KANDICE Muse, MD;  Location: [facility] ENDOS[facility];  Service: Endoscopy;  Laterality: N/A;   DIALYSIS/PERMA CATHETER INSERTION Right 03/19/2023   Procedure: DIALYSIS/PERMA CATHETER INSERTION;  Surgeon: Marea Selinda RAMAN, MD;  Location: [facility];  Service: Cardiovascular;  Laterality: Right;   TEMPORARY DIALYSIS CATHETER N/A 03/13/2023   Procedure: TEMPORARY DIALYSIS CATHETER;  Surgeon: Jama Cordella KANDICE, MD;  Location: [facility] INVAS[facility];  Service: Cardiovascular;  Laterality: N/A;    Social History Social History   Tobacco Use   Smoking status: Former    Current packs/day:  0.00    Types: Cigarettes    Quit date: 1976    Years since quitting: 49.5   Smokeless tobacco: Never  Vaping Use   Vaping status: Never Used  Substance Use Topics   Alcohol use: No    Comment: a beer once in awhile   Drug use: No    Family History Family History  Problem Relation Age of Onset   Heart attack Father    Diabetes Sister     No Known Allergies   REVIEW OF SYSTEMS (Negative unless checked)  Constitutional: [] Weight loss  [] Fever  [] Chills Cardiac: [] Chest pain   []   Chest pressure   [] Palpitations   [] Shortness of breath when laying flat   [] Shortness of breath at rest   [] Shortness of breath with exertion. Vascular:  [] Pain in legs with walking   [] Pain in legs at rest   [] Pain in legs when laying flat   [] Claudication   [] Pain in feet when walking  [] Pain in feet at rest  [] Pain in feet when laying flat   [] History of DVT   [] Phlebitis   [] Swelling in legs   [] Varicose veins   [] Non-healing ulcers Pulmonary:   [] Uses home oxygen   [] Productive cough   [] Hemoptysis   [] Wheeze  [] COPD   [] Asthma Neurologic:  [] Dizziness  [] Blackouts   [] Seizures   [] History of stroke   [] History of TIA  [] Aphasia   [] Temporary blindness   [] Dysphagia   [] Weakness or numbness in arms   [] Weakness or numbness in legs Musculoskeletal:  [] Arthritis   [] Joint swelling   [] Joint pain   [] Low back pain Hematologic:  [] Easy bruising  [] Easy bleeding   [] Hypercoagulable state   [] Anemic  [] Hepatitis Gastrointestinal:  [] Blood in stool   [] Vomiting blood  [] Gastroesophageal reflux/heartburn   [] Difficulty swallowing. Genitourinary:  [] Chronic kidney disease   [] Difficult urination  [] Frequent urination  [] Burning with urination   [] Blood in urine Skin:  [] Rashes   [] Ulcers   [] Wounds Psychological:  [] History of anxiety   []  History of major depression.  Physical Examination  Vitals:   11/22/23 0912 11/22/23 0913  BP: (!) 137/97   Pulse: (!) 56   Resp: 18   Temp: 97.8 F (36.6 C)    TempSrc: Oral   SpO2: 98%   Weight:  73 kg  Height:  5' 11 (1.803 m)   Body mass index is 22.45 kg/m. Gen:  WD/WN, NAD Head: Orrum/AT, No temporalis wasting. Prominent temp pulse not noted. Neck: Trachea midline.  No JVD.  Pulmonary:  Good air movement, respirations not labored, equal bilaterally. RIGHT internal jugular Permacath- small crack at distal portion of arterial port. No evidence of bleeding Cardiac: RRR, normal S1, S2. Gastrointestinal: soft, non-tender/non-distended. No guarding/reflex.  Musculoskeletal: M/S 5/5 throughout.  Extremities without ischemic changes.  No deformity or atrophy. No edema.LEFT brachiocephalic AV with faint thrill and bruit, palpable radial, hand warm      CBC Lab Results  Component Value Date   WBC 6.0 11/22/2023   HGB 14.3 11/22/2023   HCT 44.5 11/22/2023   MCV 92.5 11/22/2023   PLT 194 11/22/2023    BMET    Component Value Date/Time   NA 142 11/22/2023 0921   K 6.6 (HH) 11/22/2023 0921   CL 102 11/22/2023 0921   CO2 20 (L) 11/22/2023 0921   GLUCOSE 131 (H) 11/22/2023 0921   BUN 133 (H) 11/22/2023 0921   CREATININE 16.30 (H) 11/22/2023 0921   CALCIUM  8.7 (L) 11/22/2023 0921   GFRNONAA 3 (L) 11/22/2023 0921   Estimated Creatinine Clearance: 4.4 mL/min (A) (by C-G formula based on SCr of 16.3 mg/dL (H)).  COAG Lab Results  Component Value Date   INR 1.2 11/17/2023    Radiology DG Chest 2 View Result Date: 11/14/2023 CLINICAL DATA:  Hypoxia. EXAM: CHEST - 2 VIEW COMPARISON:  None Available. FINDINGS: A right-sided venous catheter is seen with its distal tip overlying the right atrium. The cardiac silhouette is enlarged and unchanged in size. Low lung volumes are noted. There is prominence of the central pulmonary vasculature. Mild atelectasis and/or infiltrate is noted within the right  lung base. No pleural effusion or pneumothorax is identified. The visualized skeletal structures are unremarkable. IMPRESSION: 1. Cardiomegaly with  mild pulmonary vascular congestion. 2. Mild right basilar atelectasis and/or infiltrate. Electronically Signed   By: Suzen Dials M.D.   On: 11/14/2023 18:21   PERIPHERAL VASCULAR CATHETERIZATION Result Date: 11/14/2023 Images from the original result were not included. Patient name: DAVEY LIMAS MRN: [id number] DOB: 11-Oct-1955 Sex: male 11/14/2023 Pre-operative Diagnosis: ESRD on HD, with slow maturation Post-operative diagnosis:  Same Surgeon:  Norman GORMAN Serve, MD Procedure Performed: Ultrasound-guided access of left arm aVF Fistulogram and central venogram Indications: Mr. Budhu is a 68 year old male with ESRD on HD.  He underwent brachiocephalic fistula creation on 09/19/2023.  This has been slow to mature and he has been undergoing dialysis via right IJ TDC.  His last dialysis session was yesterday.  Risks and benefits of fistulogram with possible balloon assisted maturation were reviewed, he expressed understanding and elected to proceed. Findings: Widely patent central venous system.  The cephalic arch appears sclerotic and that the fistula is very well collateralized to the left IJ.  There is a large branch in the midsegment of the cephalic vein fistula.  The anastomosis is widely patent  Procedure:  The patient was identified in the holding area and taken to the cath lab  The patient was then placed supine on the table and prepped and draped in the usual sterile fashion.  A time out was called.  Ultrasound was used to evaluate the left arm AV access. This was accessed under u/s guidance. An 018 wire was advanced without resistance, a micropuncture sheath was placed and fistulagram obtained which demonstrated the above findings. Contrast: 20cc Impression: Large branch of the mid segment of the cephalic vein fistula which will need to be ligated in the operating room. The cephalic arch appears sclerotic although the fistula is very well collateralized to the left IJ Norman GORMAN Serve MD [facility] [place] Office: [phone]  VAS US  DUPLEX DIALYSIS ACCESS (AVF,AVG) Result Date: 11/05/2023 DIALYSIS ACCESS Patient Name:  JAMAURION SLEMMER  Date of Exam:   11/05/2023 Medical Rec #: [id number]     Accession #:    [id number] Date of Birth: 1955/06/15     Patient Gender: M Patient Age:   20 years Exam Location:  [address] Procedure:      VAS US  DUPLEX DIALYSIS ACCESS (AVF, AVG) Referring Phys: DONNICE SENDER --------------------------------------------------------------------------------  Reason for Exam: AVF maturation assessment. Access Site: Left Upper Extremity. Access Type: Brachial-cephalic AVF. Performing Technologist: Garnette Rockers  Examination Guidelines: A complete evaluation includes B-mode imaging, spectral Doppler, color Doppler, and power Doppler as needed of all accessible portions of each vessel. Unilateral testing is considered an integral part of a complete examination. Limited examinations for reoccurring indications may be performed as noted.  Findings: +--------------------+----------+-----------------+--------+ AVF                 PSV (cm/s)Flow Vol (mL/min)Comments +--------------------+----------+-----------------+--------+ Native artery inflow   113                              +--------------------+----------+-----------------+--------+ AVF Anastomosis        194          1172                +--------------------+----------+-----------------+--------+  +---------------+----------+-------------+----------+--------+ OUTFLOW VEIN   PSV (cm/s)Diameter (cm)Depth (cm)Describe +---------------+----------+-------------+----------+--------+ Subclavian vein  59                                    +---------------+----------+-------------+----------+--------+ Shoulder          106        0.53        0.58            +---------------+----------+-------------+----------+--------+ Prox UA            65        0.59        0.47             +---------------+----------+-------------+----------+--------+ Mid UA            475        0.73        0.33   stenotic +---------------+----------+-------------+----------+--------+ Dist UA           146        0.59        0.31            +---------------+----------+-------------+----------+--------+   Summary: 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 *See table(s) above for measurements and observations.  Diagnosing physician: Gaile New MD Electronically signed by Gaile New MD on 11/05/2023 at 10:02:01 AM.    --------------------------------------------------------------------------------   Final       Assessment/Plan 1. Unable to procure repair kit for permacath therefore-Patient to return on Monday for Permacath exchange but NO SHOWED TWICE once in the morning and once in the afternoon. He is here today in the emergency department.  Potassium is 6.6 this morning and other labs normal- will do a perma catheter exchange today under local anesthesia only.  2. Scheduled for cephalic branch ligation at [facility] sometime next month to assit in fistula maturation  I discussed the case with Dr Selinda Gu Md and he is in agreement with the plan.   Gwendlyn JONELLE Shank, NP  11/22/2023 10:55 AM    This note was created with Dragon medical transcription system.  Any error is purely unintentional

## 2023-11-22 NOTE — Progress Notes (Signed)
 [facility]  ROUNDING NOTE   Subjective:   Victor Hernandez is a 68 y.o. male with past medical history of ESRD on HD TTS, right IJ PermCath placement, history of left upper extremity fistula, hypertension, hyperlipidemia, diabetes mellitus type 2, coronary disease, chronic systolic heart failure ejection fraction 45 to 50%, severe pulmonary hypertension, anemia chronic kidney disease, secondary hyperparathyroidism who presented with a fall yesterday resulting in right leg pain. He is admitted for Hyperkalemia [E87.5] SOB (shortness of breath) [R06.02] ESRD on dialysis (HCC) [N18.6, Z99.2] Hemodialysis catheter dysfunction, initial encounter (HCC) [T82.41XA] Acute cough [R05.1]  Patient is known to our practice and receives outpatient dialysis treatments at [facility] on a MWF schedule, supervised by Dr Jimmie. Last treatment received on 6/17. ED was contacted by dialysis clinic to inform them of need for dialysis and permcath exchange. Patient seen and evaluated during dialysis.    HEMODIALYSIS FLOWSHEET:  Blood Flow Rate (mL/min): 350 mL/min Arterial Pressure (mmHg): -169.69 mmHg Venous Pressure (mmHg): 214.94 mmHg TMP (mmHg): 6.06 mmHg Ultrafiltration Rate (mL/min): 1316 mL/min Dialysate Flow Rate (mL/min): 300 ml/min  Alert, able to answer simple questions. Drowsy from recent procedure. Cough present.   Labs on ED arrival concerning for potassium 6.6, serum bicarb 20, BUN 133, Creatinine 16.3, BNP 3214. Respiratory panel negative. Chest xray shows pulmonary vascular congestion.   We have been consulted to provide urgent dialysis once permcath exchanged.    Objective:  Vital signs in last 24 hours:  Temp:  [97.4 F (36.3 C)-97.8 F (36.6 C)] 97.4 F (36.3 C) (06/26 1252) Pulse Rate:  [55-90] 89 (06/26 1600) Resp:  [17-25] 17 (06/26 1600) BP: (137-163)/(75-108) 138/105 (06/26 1600) SpO2:  [81 %-100 %] 99 % (06/26 1600) Weight:  [73 kg] 73 kg (06/26  0913)  Weight change:  Filed Weights   11/22/23 0913  Weight: 73 kg    Intake/Output: No intake/output data recorded.   Intake/Output this shift:  No intake/output data recorded.  Physical Exam: General: NAD  Head: Normocephalic, atraumatic. Moist oral mucosal membranes  Eyes: Anicteric  Neck: Supple  Lungs:  Crackles, [place]   Heart: Regular rate and rhythm  Abdomen:  Soft, nontender  Extremities:  No peripheral edema.  Neurologic: Somnolent but arousable.   Skin: Warm,dry, no rash  Access: Rt internal jugular permcath    Basic Metabolic Panel: Recent Labs  Lab 11/17/23 0856 11/22/23 0921  NA 137 142  K 5.1 6.6*  CL 98 102  CO2 22 20*  GLUCOSE 295* 131*  BUN 78* 133*  CREATININE 10.30* 16.30*  CALCIUM  8.6* 8.7*    Liver Function Tests: Recent Labs  Lab 11/22/23 0921  AST 22  ALT 33  ALKPHOS 90  BILITOT 1.4*  PROT 6.8  ALBUMIN 3.2*   No results for input(s): LIPASE, AMYLASE in the last 168 hours. No results for input(s): AMMONIA in the last 168 hours.  CBC: Recent Labs  Lab 11/17/23 0743 11/22/23 0921  WBC 6.5 6.0  NEUTROABS 4.0  --   HGB 13.4 14.3  HCT 43.2 44.5  MCV 97.1 92.5  PLT 142* 194    Cardiac Enzymes: No results for input(s): CKTOTAL, CKMB, CKMBINDEX, TROPONINI in the last 168 hours.  BNP: Invalid input(s): POCBNP  CBG: Recent Labs  Lab 11/22/23 1104 11/22/23 1236  GLUCAP 143* 109*    Microbiology: Results for orders placed or performed during the hospital encounter of 11/22/23  Resp panel by RT-PCR (RSV, Flu A&B, Covid) Anterior Nasal Swab  Status: None   Collection Time: 11/22/23 11:27 AM   Specimen: Anterior Nasal Swab  Result Value Ref Range Status   SARS Coronavirus 2 by RT PCR NEGATIVE NEGATIVE Final    Comment: (NOTE) SARS-CoV-2 target nucleic acids are NOT DETECTED.  The SARS-CoV-2 RNA is generally detectable in upper respiratory specimens during the acute phase of infection. The  lowest concentration of SARS-CoV-2 viral copies this assay can detect is 138 copies/mL. A negative result does not preclude SARS-Cov-2 infection and should not be used as the sole basis for treatment or other patient management decisions. A negative result may occur with  improper specimen collection/handling, submission of specimen other than nasopharyngeal swab, presence of viral mutation(s) within the areas targeted by this assay, and inadequate number of viral copies(<138 copies/mL). A negative result must be combined with clinical observations, patient history, and epidemiological information. The expected result is Negative.  Fact Sheet for Patients:  BloggerCourse.com  Fact Sheet for Healthcare Providers:  SeriousBroker.it  This test is no t yet approved or cleared by the [place]  FDA and  has been authorized for detection and/or diagnosis of SARS-CoV-2 by FDA under an Emergency Use Authorization (EUA). This EUA will remain  in effect (meaning this test can be used) for the duration of the COVID-19 declaration under Section 564(b)(1) of the Act, 21 U.S.C.section 360bbb-3(b)(1), unless the authorization is terminated  or revoked sooner.       Influenza A by PCR NEGATIVE NEGATIVE Final   Influenza B by PCR NEGATIVE NEGATIVE Final    Comment: (NOTE) The Xpert Xpress SARS-CoV-2/FLU/RSV plus assay is intended as an aid in the diagnosis of influenza from Nasopharyngeal swab specimens and should not be used as a sole basis for treatment. Nasal washings and aspirates are unacceptable for Xpert Xpress SARS-CoV-2/FLU/RSV testing.  Fact Sheet for Patients: BloggerCourse.com  Fact Sheet for Healthcare Providers: SeriousBroker.it  This test is not yet approved or cleared by the [place]  FDA and has been authorized for detection and/or diagnosis of SARS-CoV-2 by FDA under  an Emergency Use Authorization (EUA). This EUA will remain in effect (meaning this test can be used) for the duration of the COVID-19 declaration under Section 564(b)(1) of the Act, 21 U.S.C. section 360bbb-3(b)(1), unless the authorization is terminated or revoked.     Resp Syncytial Virus by PCR NEGATIVE NEGATIVE Final    Comment: (NOTE) Fact Sheet for Patients: BloggerCourse.com  Fact Sheet for Healthcare Providers: SeriousBroker.it  This test is not yet approved or cleared by the [place]  FDA and has been authorized for detection and/or diagnosis of SARS-CoV-2 by FDA under an Emergency Use Authorization (EUA). This EUA will remain in effect (meaning this test can be used) for the duration of the COVID-19 declaration under Section 564(b)(1) of the Act, 21 U.S.C. section 360bbb-3(b)(1), unless the authorization is terminated or revoked.  Performed at [facility], [address] [place], [place] [address]     Coagulation Studies: No results for input(s): LABPROT, INR in the last 72 hours.  Urinalysis: No results for input(s): COLORURINE, LABSPEC, PHURINE, GLUCOSEU, HGBUR, BILIRUBINUR, KETONESUR, PROTEINUR, UROBILINOGEN, NITRITE, LEUKOCYTESUR in the last 72 hours.  Invalid input(s): APPERANCEUR    Imaging: PERIPHERAL VASCULAR CATHETERIZATION Result Date: 11/22/2023 See surgical note for result.  DG Chest 1 View Result Date: 11/22/2023 CLINICAL DATA:  Patient states he has something wrong with his port in chest. EXAM: CHEST  1 VIEW COMPARISON:  November 14, 2023 FINDINGS: There is stable right internal jugular venous  catheter positioning. The cardiac silhouette is enlarged and unchanged in size. Mild prominence of the central pulmonary vasculature is noted. This is decreased in severity when compared to the prior study. There is no evidence of acute infiltrate, pleural effusion or  pneumothorax. The visualized skeletal structures are unremarkable. IMPRESSION: Stable cardiomegaly with mild pulmonary vascular congestion. Electronically Signed   By: Suzen Dials M.D.   On: 11/22/2023 11:10     Medications:     Chlorhexidine  Gluconate Cloth  6 each Topical Q0600     Assessment/ Plan:  Mr. Victor Hernandez is a 68 y.o.  male with past medical history of ESRD on HD TTS, right IJ PermCath placement, history of left upper extremity fistula, hypertension, hyperlipidemia, diabetes mellitus type 2, coronary disease, chronic systolic heart failure ejection fraction 45 to 50%, severe pulmonary hypertension, anemia chronic kidney disease, secondary hyperparathyroidism who presented with   End stage renal disease with hyperkalemia on disease on hemodialysis. Last treatment received on 6/17. HD access malfunction. Potassium 6.6. Will correct with dialysis, 1K bath. Will attempt 3L fluid removal. Next treatment scheduled for Friday.   2. Malfunctioning HD access. Vascular consulted and performed a permcath exchange. Access functioning well during treatment.   3. Anemia of chronic kidney disease Lab Results  Component Value Date   HGB 14.3 11/22/2023    Hgb within optimal range. Will continue to monitor for need of ESA.   4. Hypertension with chronic kidney disease.  Home regimen includes amlodipine , metoprolol , and torsemide . All currently held.     LOS: 0 Tremar Wickens 6/26/20254:28 PM

## 2023-11-22 NOTE — ED Notes (Signed)
 Called to floor to have the rec nurse review chart and complete handoff.

## 2023-11-22 NOTE — ED Triage Notes (Signed)
 Pt reports falling yesterday and right leg pain. Also received a call this morning that pt needs dialysis. Pt states he has something wrong with his port in chest and they were unable to fix. No dialysis last week.

## 2023-11-22 NOTE — ED Notes (Signed)
 Dialysis consent signed and placed at bedside.

## 2023-11-22 NOTE — ED Provider Notes (Signed)
 Victor Hernandez Provider Note    Event Date/Time   First MD Initiated Contact with Patient 11/22/23 1023     (approximate)   History   Vascular Access Problem, needs dialysis, and Fall   HPI  Victor Hernandez is a 68 y.o. male with history of ESRD on dialysis, diabetes, not on anticoagulation, presenting with leg pain after a fall yesterday.  States that he tripped, hit his right shin, did not fall to the ground or hit his head.  States that he is ambulatory, no new weakness or numbness, no open wounds, states that he does not have any pain to his legs right now.  Was also supposed get dialysis today but was told that the dialysis nurses were unable to access his port.  Does have a left upper extremity fistula but is unable to use that at this time since it has not matured, this was placed on September 19, 2023. He denies any chest pain, states that he does still avoid spontaneously, does have mild shortness of breath.  Patient states that he did not see vascular surgery to get his permacath repaired.    On independent chart review, he is not on any anticoagulation, he is on torsemide , he was also seen in the ED on the 21st, seems that he had a small fracture of the catheter in his arterial port, discussion was done with vascular surgery and given that his potassium was normal, no indication for emergent dialysis, he was discharged with instructions to see vascular on Monday to get his port repaired.  He was also admitted on June 18 for hypoxemic respiratory failure secondary to volume overload due to CHF, was given IV Lasix  and dialyzed during his inpatient stay.  Physical Exam   Triage Vital Signs: ED Triage Vitals  Encounter Vitals Group     BP 11/22/23 0912 (!) 137/97     Girls Systolic BP Percentile --      Girls Diastolic BP Percentile --      Boys Systolic BP Percentile --      Boys Diastolic BP Percentile --      Pulse Rate 11/22/23 0912 (!) 56     Resp 11/22/23  0912 18     Temp 11/22/23 0912 97.8 F (36.6 C)     Temp Source 11/22/23 0912 Oral     SpO2 11/22/23 0912 98 %     Weight 11/22/23 0913 160 lb 15 oz (73 kg)     Height 11/22/23 0913 5' 11 (1.803 m)     Head Circumference --      Peak Flow --      Pain Score 11/22/23 0913 8     Pain Loc --      Pain Education --      Exclude from Growth Chart --     Most recent vital signs: Vitals:   11/22/23 1100 11/22/23 1103  BP:    Pulse:    Resp:    Temp:    SpO2: 93% 90%     General: Awake, no distress.  CV:  Good peripheral perfusion.  Resp:  Normal effort.  Clear Abd:  No distention.  Nontender Other:  No tenderness to the bilateral lower extremities, no open wounds, swelling, erythema.  He does have a left upper extremity AV fistula with palpable thrill, does have a permacath to the right chest.  There is no overlying erythema, swelling, purulent drainage from the permacath site.   ED  Results / Procedures / Treatments   Labs (all labs ordered are listed, but only abnormal results are displayed) Labs Reviewed  COMPREHENSIVE METABOLIC PANEL WITH GFR - Abnormal; Notable for the following components:      Result Value   Potassium 6.6 (*)    CO2 20 (*)    Glucose, Bld 131 (*)    BUN 133 (*)    Creatinine, Ser 16.30 (*)    Calcium  8.7 (*)    Albumin 3.2 (*)    Total Bilirubin 1.4 (*)    GFR, Estimated 3 (*)    Anion gap 20 (*)    All other components within normal limits  CBC - Abnormal; Notable for the following components:   RDW 16.3 (*)    All other components within normal limits  CBG MONITORING, ED - Abnormal; Notable for the following components:   Glucose-Capillary 143 (*)    All other components within normal limits  RESP PANEL BY RT-PCR (RSV, FLU A&B, COVID)  RVPGX2  BRAIN NATRIURETIC PEPTIDE  TROPONIN I (HIGH SENSITIVITY)     EKG  EKG shows, sinus rhythm with PACs, rate of 57, normal QRS, normal QTc, no obvious ischemic ST elevation, T wave flattening in  3, aVF, aVL, T wave flattening new compared to prior   RADIOLOGY On my independent interpretation, chest x-ray with vascular congestion   PROCEDURES:  Critical Care performed: Yes, see critical care procedure note(s)  .Critical Care  Performed by: Waymond Lorelle Cummins, MD Authorized by: Waymond Lorelle Cummins, MD   Critical care provider statement:    Critical care time (minutes):  40   Critical care was necessary to treat or prevent imminent or life-threatening deterioration of the following conditions:  Metabolic crisis   Critical care was time spent personally by me on the following activities:  Development of treatment plan with patient or surrogate, discussions with consultants, evaluation of patient's response to treatment, examination of patient, ordering and review of laboratory studies, ordering and review of radiographic studies, ordering and performing treatments and interventions, pulse oximetry, re-evaluation of patient's condition and review of old charts    MEDICATIONS ORDERED IN ED: Medications  sodium zirconium cyclosilicate (LOKELMA) packet 10 g (has no administration in time range)  calcium  gluconate 1 g/ 50 mL sodium chloride  IVPB (has no administration in time range)  Chlorhexidine  Gluconate Cloth 2 % PADS 6 each (has no administration in time range)  insulin  aspart (novoLOG ) injection 5 Units (5 Units Intravenous Given 11/22/23 1113)    And  dextrose  50 % solution 50 mL (50 mLs Intravenous Given 11/22/23 1113)     IMPRESSION / MDM / ASSESSMENT AND PLAN / ED COURSE  I reviewed the triage vital signs and the nursing notes.                              Differential diagnosis includes, but is not limited to, fracture, strain, sprain, musculoskeletal pain, derangements, volume overload.  Labs, EKG  Patient's presentation is most consistent with acute presentation with potential threat to life or bodily function.  Independent interpretation of labs and imaging below.  Labs  notable for hyperkalemia, no peaked T waves or widened QRS, will given some calcium  gluconate, insulin  dextrose , Lokelma.  Consulted nephrology who states that they will arrange for dialysis today.  He will need to be admitted for further management.  Will also reach out to vascular surgery to discuss about his port.  Consulted vascular surgery and they will plan to exchange his permacath today.  Patient noted to be coughing intermittently, with desat to 90% during the cough but once he stops, oxygen sat will go up to 94% on room air, was placed on 2 L nasal cannula.  Consult hospitalist was agreeable with plan for admission and will evaluate the patient, he is admitted.  The patient is on the cardiac monitor to evaluate for evidence of arrhythmia and/or significant heart rate changes.   Clinical Course as of 11/22/23 1115  Thu Nov 22, 2023  1027 Independent review of labs, he is hyperkalemic to 6.6, creatinine is elevated compared to prior, mildly elevated T. bili but he has no jaundice or right upper quadrant abdominal pain this time, no leukocytosis. [TT]  1115 DG Chest 1 View Stable cardiomegaly with mild pulmonary vascular congestion.  [TT]    Clinical Course User Index [TT] Waymond Lorelle Cummins, MD     FINAL CLINICAL IMPRESSION(S) / ED DIAGNOSES   Final diagnoses:  Hyperkalemia  ESRD on dialysis (HCC)  SOB (shortness of breath)  Acute cough  Hemodialysis catheter dysfunction, initial encounter [facility])     Rx / DC Orders   ED Discharge Orders     None        Note:  This document was prepared using Dragon voice recognition software and may include unintentional dictation errors.    Waymond Lorelle Cummins, MD 11/22/23 [id number]

## 2023-11-22 NOTE — Op Note (Signed)
 OPERATIVE NOTE    PRE-OPERATIVE DIAGNOSIS: 1. ESRD 2. Non-functional permcath  POST-OPERATIVE DIAGNOSIS: same as above  PROCEDURE: Fluoroscopic guidance for placement of catheter Placement of a 19 cm tip to cuff tunneled hemodialysis catheter via the right internal jugular vein and removal of previous catheter  SURGEON: Selinda Gu, MD  ANESTHESIA:  Local with moderate conscious sedation for 7 minutes using 2 mg of Versed    ESTIMATED BLOOD LOSS: 5 cc  FINDING(S): none  SPECIMEN(S):  None  INDICATIONS:   Patient is a 68 y.o.male who presents with non-functional dialysis catheter and ESRD.  The patient needs long term dialysis access for their ESRD, and a Permcath is necessary.  Risks and benefits are discussed and informed consent is obtained.    DESCRIPTION: After obtaining full informed written consent, the patient was brought back to the vascular suite. The patient received moderate conscious sedation during a face-to-face encounter with me present throughout the entire procedure and supervising the RN monitoring the vital signs, pulse oximetry, telemetry, and mental status throughout the entire procedure. The patient's existing catheter, right neck and chest were sterilely prepped and draped in a sterile surgical field was created.  The existing catheter was dissected free from the fibrous sheath securing the cuff with hemostats and blunt dissection.  A wire was placed. The existing catheter was then removed and the wire used to keep venous access. I selected a 19 cm tip to cuff tunneled dialysis catheter.  Using fluoroscopic guidance the catheter tips were parked in the right atrium. The appropriate distal connectors were placed. It withdrew blood well and flushed easily with heparinized saline and a concentrated heparin  solution was then placed. It was secured to the chest wall with 2 Prolene sutures. A 4-0 Monocryl pursestring suture was placed around the exit site. Sterile dressings  were placed. The patient tolerated the procedure well and was taken to the recovery room in stable condition.  COMPLICATIONS: None  CONDITION: Stable  Selinda Gu 11/22/2023 12:25 PM   This note was created with Dragon Medical transcription system. Any errors in dictation are purely unintentional.

## 2023-11-22 NOTE — Progress Notes (Signed)
 Patient's glucose for bedtime 72. Not flowing into epic

## 2023-11-22 NOTE — H&P (Signed)
 History and Physical    Victor Hernandez FMW:[id number] DOB: 29-Dec-1955 DOA: 11/22/2023  PCP: Patient, No Pcp Per  Patient coming from: home  I have personally briefly reviewed patient's old medical records in [facility]  Chief Complaint: HD port dysfunction   HPI: Victor Hernandez is a 68 y.o. male with medical history significant of  hypertension, insulin -dependent type 2 diabetes mellitus, HFpEF, severe mitral regurg , ESRD on HD, who presents to ED after not having HD x 1 week due to port dysfunction.  Patient was referred from HD for expedited vascular access and Urgent HD. Patient s/p  vascular procedure currently on HD.  S/p medication patient in somnolent. He note current concerns denies chest pain , sob/ n/v/ but does note cough. Patient not able to give further history regarding cough at this time.   ED Course:  Patient was discussed with vascular and nephrology. Plan is for vascular to obtain HD access and there after patient will undergo HD.   Afeb, bp 137/97, hr 56, rr 18, sat 98%  EKG: sinus brady 1st degree AV block  Wbc 6, hgb 14.3, plt194  Na 142, K 6.6, cl 102, bicarb 20, cr 16.30 aG 20 BNP 3214.5 CE6 RVP-neg Cxr IMPRESSION: Stable cardiomegaly with mild pulmonary vascular congestion.    tx hyperkalemia protocol ,  lokelma given,  Review of Systems: As per HPI otherwise 10 point review of systems negative.   Past Medical History:  Diagnosis Date   Anemia of chronic renal failure    Arthritis    CAD (coronary artery disease)    a. CT chest 05/31/2020: extensive CAD; b. MV 06/06/2023: EF 41%, mod sized fixed defect in basal to mid interolat/anterolat and basal inf wall associated with hypokinesis suggestive of prior infarct in LCx territory. No ischemia.   Cardiomegaly    Cerebral hemorrhage, nontraumatic (HCC)    a. brain MRI 06/01/2020: chronic microhemorrhage in the right parietal lobe   Cerebral microvascular disease    Chronic Heart failure with mid-range  ejection fraction (HCC)    a.TTE 03/13/2023: EF 45-50%, glob HK, mild LV dil, mod LVH, G3DD, GLS -14.4%, RVSP 74.3, mod-sev MR, mild-mod TR, AoV sclerosis without stenosis; b.TTE 06/06/2023: EF 50%, mod LVH, G2DD, severe LAE, RAE/RVE,  mildly reduced RVSF, RVSP 85, triv AR/PR, sev MR   Depression    DOE (dyspnea on exertion)    ESRD (end stage renal disease) on dialysis (HCC)    Heart murmur    Hyperlipidemia    Hypertension    Insomnia    a. takes melatonin PRN   Long-term use of aspirin  therapy    NSVT (nonsustained ventricular tachycardia) (HCC)    PAF (paroxysmal atrial fibrillation) (HCC)    PONV (postoperative nausea and vomiting)    Severe mitral regurgitation    a. TTE 06/01/2020: mod-severe MR; b. TTE 03/13/2023: mod-severe MR; c. TTE 06/06/2023: severe MR   Severe pulmonary hypertension (HCC)    a. TTE 03/13/2023: RVSP 74.3 mmHg; b. TTE 06/06/2023: RSVP 85 mmHg.   T2DM (type 2 diabetes mellitus) (HCC)     Past Surgical History:  Procedure Laterality Date   A/V SHUNT INTERVENTION N/A 11/14/2023   Procedure: A/V SHUNT INTERVENTION;  Surgeon: Pearline Norman RAMAN, MD;  Location: [facility];  Service: Cardiovascular;  Laterality: N/A;   AV FISTULA PLACEMENT Left 09/19/2023   Procedure: ARTERIOVENOUS (AV) FISTULA CREATION;  Surgeon: Serene Gaile LELON, MD;  Location: [facility];  Service: Vascular;  Laterality: Left;  COLONOSCOPY     COLONOSCOPY WITH PROPOFOL  N/A 05/19/2016   Procedure: COLONOSCOPY WITH PROPOFOL ;  Surgeon: Louanne KANDICE Muse, MD;  Location: [facility] END[facility];  Service: Endoscopy;  Laterality: N/A;   DIALYSIS/PERMA CATHETER INSERTION Right 03/19/2023   Procedure: DIALYSIS/PERMA CATHETER INSERTION;  Surgeon: Marea Selinda RAMAN, MD;  Location: [facility];  Service: Cardiovascular;  Laterality: Right;   TEMPORARY DIALYSIS CATHETER N/A 03/13/2023   Procedure: TEMPORARY DIALYSIS CATHETER;  Surgeon: Jama Cordella KANDICE, MD;  Location: [facility];  Service:  Cardiovascular;  Laterality: N/A;     reports that he quit smoking about 49 years ago. His smoking use included cigarettes. He has never used smokeless tobacco. He reports that he does not drink alcohol and does not use drugs.  No Known Allergies  Family History  Problem Relation Age of Onset   Heart attack Father    Diabetes Sister     Prior to Admission medications   Medication Sig Start Date End Date Taking? Authorizing Provider  acetaminophen  (TYLENOL ) 325 MG tablet Take 650 mg by mouth every 6 (six) hours as needed.   Yes [provider]  albuterol  (VENTOLIN  HFA) 108 (90 Base) MCG/ACT inhaler Inhale 2 puffs into the lungs every 6 (six) hours as needed for wheezing or shortness of breath. 11/15/23  Yes Shah, Pratik D, DO  amLODipine  (NORVASC ) 5 MG tablet Take 5 mg by mouth 2 (two) times daily.   Yes [provider]  aspirin  EC 81 MG tablet Take 1 tablet (81 mg total) by mouth daily. 07/12/20  Yes Caleen Qualia, MD  atorvastatin  (LIPITOR) 40 MG tablet Take 40 mg by mouth daily.   Yes [provider]  calcitRIOL  (ROCALTROL ) 0.25 MCG capsule Take 1 capsule (0.25 mcg total) by mouth daily. 03/21/23  Yes Fausto Sor A, DO  calcium  acetate (PHOSLO ) 667 MG capsule Take 2 capsules (1,334 mg total) by mouth 3 (three) times daily with meals. 03/20/23  Yes Fausto Sor LABOR, DO  docusate sodium  (COLACE) 100 MG capsule Take 100 mg by mouth daily as needed for mild constipation.   Yes [provider]  Ferrous Sulfate  (SLOW FE) 142 (45 Fe) MG TBCR Take 1 tablet by mouth daily.   Yes [provider]  insulin  aspart (NOVOLOG ) 100 UNIT/ML injection Inject 4-12 Units into the skin as directed. Sliding scale 150-200 = 4 units 201-250 = 6 units 251-300 = 8 units 301-350 = 10 units 351-400 = 12 units   Yes [provider]  melatonin 5 MG TABS Take 1 tablet (5 mg total) by mouth at bedtime. Patient taking differently: Take 5 mg by mouth at bedtime  as needed (sleep). 03/20/23  Yes Fausto Sor A, DO  metoprolol  tartrate (LOPRESSOR ) 50 MG tablet Take 1 tablet (50 mg total) by mouth 2 (two) times daily. 03/20/23  Yes Fausto Sor A, DO  oxyCODONE -acetaminophen  (PERCOCET) 5-325 MG tablet Take 1 tablet by mouth every 6 (six) hours as needed (pain). 09/19/23  Yes Rhyne, Samantha J, PA-C  torsemide  (DEMADEX ) 10 MG tablet Take 5 tablets (50 mg total) by mouth daily. 11/16/23  Yes Shah, Pratik D, DO  blood glucose meter kit and supplies Dispense based on patient and insurance preference. Use up to four times daily as directed. (FOR ICD-10 E10.9, E11.9). 06/02/20   Jens Durand, MD  multivitamin (RENA-VIT) TABS tablet Take 1 tablet by mouth at bedtime. Patient not taking: Reported on 11/22/2023 03/20/23   Fausto Sor LABOR, DO    Physical  Exam: Vitals:   11/22/23 0912 11/22/23 0913 11/22/23 1100 11/22/23 1103  BP: (!) 137/97     Pulse: (!) 56     Resp: 18     Temp: 97.8 F (36.6 C)     TempSrc: Oral     SpO2: 98%  93% 90%  Weight:  73 kg    Height:  5' 11 (1.803 m)      Constitutional: NAD, calm, comfortable, lethargic   Vitals:   11/22/23 0912 11/22/23 0913 11/22/23 1100 11/22/23 1103  BP: (!) 137/97     Pulse: (!) 56     Resp: 18     Temp: 97.8 F (36.6 C)     TempSrc: Oral     SpO2: 98%  93% 90%  Weight:  73 kg    Height:  5' 11 (1.803 m)     Eyes:  lids and conjunctivae normal ENMT: Mucous membranes are moist. Posterior pharynx clear of any exudate or lesions.Normal dentition.  Neck: normal, supple, no masses, no thyromegaly Respiratory: ant clear to auscultation ,no wheezing, no crackles. Normal respiratory effort. No accessory muscle use.  Cardiovascular: Regular rate and rhythm, + murmurs  no rubs / gallops. +extremity edema.  Abdomen: no tenderness, no masses palpated. No hepatosplenomegaly. Bowel sounds positive.  Musculoskeletal: no clubbing / cyanosis. No joint deformity upper and lower extremities. Good ROM, no  contractures. Normal muscle tone.  Skin: chronic venostasis changes in lower extremities  Neurologic: CN grossly intact. Sensation intact, MAE X4 Psychiatric: Normal judgment and insight. Alert and oriented x 3. Normal mood.    Labs on Admission: I have personally reviewed following labs and imaging studies  CBC: Recent Labs  Lab 11/17/23 0743 11/22/23 0921  WBC 6.5 6.0  NEUTROABS 4.0  --   HGB 13.4 14.3  HCT 43.2 44.5  MCV 97.1 92.5  PLT 142* 194   Basic Metabolic Panel: Recent Labs  Lab 11/17/23 0856 11/22/23 0921  NA 137 142  K 5.1 6.6*  CL 98 102  CO2 22 20*  GLUCOSE 295* 131*  BUN 78* 133*  CREATININE 10.30* 16.30*  CALCIUM  8.6* 8.7*   GFR: Estimated Creatinine Clearance: 4.4 mL/min (A) (by C-G formula based on SCr of 16.3 mg/dL (H)). Liver Function Tests: Recent Labs  Lab 11/22/23 0921  AST 22  ALT 33  ALKPHOS 90  BILITOT 1.4*  PROT 6.8  ALBUMIN 3.2*   No results for input(s): LIPASE, AMYLASE in the last 168 hours. No results for input(s): AMMONIA in the last 168 hours. Coagulation Profile: Recent Labs  Lab 11/17/23 0743  INR 1.2   Cardiac Enzymes: No results for input(s): CKTOTAL, CKMB, CKMBINDEX, TROPONINI in the last 168 hours. BNP (last 3 results) No results for input(s): PROBNP in the last 8760 hours. HbA1C: No results for input(s): HGBA1C in the last 72 hours. CBG: Recent Labs  Lab 11/22/23 1104  GLUCAP 143*   Lipid Profile: No results for input(s): CHOL, HDL, LDLCALC, TRIG, CHOLHDL, LDLDIRECT in the last 72 hours. Thyroid  Function Tests: No results for input(s): TSH, T4TOTAL, FREET4, T3FREE, THYROIDAB in the last 72 hours. Anemia Panel: No results for input(s): VITAMINB12, FOLATE, FERRITIN, TIBC, IRON, RETICCTPCT in the last 72 hours. Urine analysis:    Component Value Date/Time   COLORURINE STRAW (A) 03/30/2022 0936   APPEARANCEUR CLEAR (A) 03/30/2022 0936   LABSPEC 1.009  03/30/2022 0936   PHURINE 6.0 03/30/2022 0936   GLUCOSEU >=500 (A) 03/30/2022 0936   HGBUR SMALL (A) 03/30/2022 9063  BILIRUBINUR NEGATIVE 03/30/2022 0936   KETONESUR NEGATIVE 03/30/2022 0936   PROTEINUR >=300 (A) 03/30/2022 0936   NITRITE NEGATIVE 03/30/2022 0936   LEUKOCYTESUR NEGATIVE 03/30/2022 0936    Radiological Exams on Admission: DG Chest 1 View Result Date: 11/22/2023 CLINICAL DATA:  Patient states he has something wrong with his port in chest. EXAM: CHEST  1 VIEW COMPARISON:  November 14, 2023 FINDINGS: There is stable right internal jugular venous catheter positioning. The cardiac silhouette is enlarged and unchanged in size. Mild prominence of the central pulmonary vasculature is noted. This is decreased in severity when compared to the prior study. There is no evidence of acute infiltrate, pleural effusion or pneumothorax. The visualized skeletal structures are unremarkable. IMPRESSION: Stable cardiomegaly with mild pulmonary vascular congestion. Electronically Signed   By: Suzen Dials M.D.   On: 11/22/2023 11:10    EKG: Independently reviewed. See above   Assessment/Plan   HD Access dysfunction  -vascular surgery on board  - plan for obtaining HD acess  - pt currently has nonfunctioning HD line and due to this has not been able to undergo HD x1 week  Hyperkalemia  -due to missing HD  - s/p hyperkalemia protocol  - HD planned for this afternoon once HD access obtained    ESRD -with associated mild fluid overload and hyperkalemia due to missed HD session  -planned HD session this pm  -nephrology on board  HFpEF -fluid managed by HD   Hypertension  -stable -resume home regimen   DMII -iss/fs   HLD -resume statin   Severe mitral regurg  - no active issues   DVT prophylaxis: heparin  Code Status: full/ as discussed per patient wishes in event of cardiac arrest  Family Communication: none at bedside Disposition Plan: patient  expected to be admitted  greater than 2 midnights  Consults called: full/ as discussed per patient wishes in event of cardiac arrest  Admission status:cardiac tele   Camila DELENA Ned MD [facility]   If 7PM-7AM, please contact night-coverage [email or web address] Password [email or web address]  11/22/2023, 11:49 AM

## 2023-11-22 NOTE — Progress Notes (Signed)
 Hemodialysis Note:  Received patient in bed from specials. Patient just got his new dialysis access. Still sleeping but responds to voice. Informed consent singed and in chart.  Treatment initiated: 1308 Treatment completed: 1650  Access used: Right internal jugular catheter Access issues: None  Patient tolerated well. Transported back to room, alert without acute distress. Report given to patient's RN.  Total UF removed: 2.9 liter Medications given: None  Post HD weight: unable to get weight, bed scale not working  [facility]

## 2023-11-22 NOTE — ED Notes (Signed)
CCMD notified

## 2023-11-23 DIAGNOSIS — T8241XA Breakdown (mechanical) of vascular dialysis catheter, initial encounter: Secondary | ICD-10-CM

## 2023-11-23 LAB — CBC
HCT: 41.9 % (ref 39.0–52.0)
Hemoglobin: 13.8 g/dL (ref 13.0–17.0)
MCH: 29.9 pg (ref 26.0–34.0)
MCHC: 32.9 g/dL (ref 30.0–36.0)
MCV: 90.7 fL (ref 80.0–100.0)
Platelets: 169 10*3/uL (ref 150–400)
RBC: 4.62 MIL/uL (ref 4.22–5.81)
RDW: 16 % — ABNORMAL HIGH (ref 11.5–15.5)
WBC: 6.1 10*3/uL (ref 4.0–10.5)
nRBC: 0 % (ref 0.0–0.2)

## 2023-11-23 LAB — COMPREHENSIVE METABOLIC PANEL WITH GFR
ALT: 27 U/L (ref 0–44)
AST: 26 U/L (ref 15–41)
Albumin: 2.7 g/dL — ABNORMAL LOW (ref 3.5–5.0)
Alkaline Phosphatase: 85 U/L (ref 38–126)
Anion gap: 17 — ABNORMAL HIGH (ref 5–15)
BUN: 75 mg/dL — ABNORMAL HIGH (ref 8–23)
CO2: 24 mmol/L (ref 22–32)
Calcium: 8.1 mg/dL — ABNORMAL LOW (ref 8.9–10.3)
Chloride: 98 mmol/L (ref 98–111)
Creatinine, Ser: 11.05 mg/dL — ABNORMAL HIGH (ref 0.61–1.24)
GFR, Estimated: 5 mL/min — ABNORMAL LOW (ref >60)
Glucose, Bld: 131 mg/dL — ABNORMAL HIGH (ref 70–99)
Potassium: 4.6 mmol/L (ref 3.5–5.1)
Sodium: 139 mmol/L (ref 135–145)
Total Bilirubin: 1.7 mg/dL — ABNORMAL HIGH (ref 0.0–1.2)
Total Protein: 6 g/dL — ABNORMAL LOW (ref 6.5–8.1)

## 2023-11-23 LAB — GLUCOSE, CAPILLARY
Glucose-Capillary: 72 mg/dL (ref 70–99)
Glucose-Capillary: 148 mg/dL — ABNORMAL HIGH (ref 70–99)
Glucose-Capillary: 174 mg/dL — ABNORMAL HIGH (ref 70–99)
Glucose-Capillary: 79 mg/dL (ref 70–99)
Glucose-Capillary: 216 mg/dL — ABNORMAL HIGH (ref 70–99)

## 2023-11-23 MED ORDER — OXYCODONE HCL 5 MG PO TABS: 5.0000 mg | ORAL_TABLET | ORAL | Status: DC | PRN

## 2023-11-23 MED ORDER — ONDANSETRON HCL 4 MG PO TABS: 4.0000 mg | ORAL_TABLET | Freq: Four times a day (QID) | ORAL | Status: DC | PRN

## 2023-11-23 MED ORDER — ONDANSETRON HCL 4 MG/2ML IJ SOLN: 4.0000 mg | Freq: Four times a day (QID) | INTRAMUSCULAR | Status: DC | PRN

## 2023-11-23 MED ORDER — MORPHINE SULFATE (PF) 2 MG/ML IV SOLN: 2.0000 mg | INTRAVENOUS | Status: DC | PRN

## 2023-11-23 NOTE — Progress Notes (Signed)
 PT Cancellation Note  Patient Details Name: Victor Hernandez MRN: [id number] DOB: 1955/11/06   Cancelled Treatment:    Reason Eval/Treat Not Completed: Other (comment). Pt out of room at this time, PT to re-attempt as able.    Doyal Shams PT, DPT 1:01 PM,11/23/23

## 2023-11-23 NOTE — Plan of Care (Signed)

## 2023-11-23 NOTE — Progress Notes (Signed)
 Hemodialysis Note:  Received patient in bed to unit. Alert and oriented. Informed consent singed and in chart.  Treatment initiated: 1228 Treatment completed: 1535  Access used: Right internal jugular catheter Access issues: None  Patient tolerated well. Transported back to room, alert without acute distress. Report given to patient's RN.  Total UF removed: 2 Liters Medications given: None  Post HD weight: 77.4 Kg  [facility]

## 2023-11-23 NOTE — Care Management Important Message (Signed)
 Important Message  Patient Details  Name: Victor Hernandez MRN: [id number] Date of Birth: 04-16-1956   Important Message Given:  Yes - Medicare IM     [name] 11/23/2023, 12:26 PM
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
## 2023-11-23 NOTE — Progress Notes (Signed)
 PROGRESS NOTE    Victor Hernandez  FMW:[id number] DOB: 20-Mar-1956 DOA: 11/22/2023 PCP: Patient, No Pcp Per   Assessment & Plan:   Principal Problem:   Hyperkalemia Active Problems:   ESRD on dialysis (HCC)   SOB (shortness of breath)   Encounter for dialysis catheter care [facility])  Assessment and Plan: HD catheter dysfunction: s/p placement of right internal jugular vein and removal of previous catheter on 11/22/23 as per vasc surg. Will have HD again today as per nephro   Hyperkalemia: resolved w/ HD   ESRD: on HD. Nephro following and recs apprec   Chronic diastolic CHF: fluid/volume management w/ HD.   HTN: continue on amlodipine , metoprolol , torsemide     DM2: poorly controlled, HbA1c 10.7. Continue on SSI w/ accuchecks    HLD: continue on statin    Severe mitral regurg: management per cardio outpatient       DVT prophylaxis: SCDs Code Status:full  Family Communication: discussed pt's care w/ pt's wife at bedside and answered her questions  Disposition Plan: likely d/c back home  Level of care: Telemetry Cardiac  Status is: Inpatient Remains inpatient appropriate because: severity of illness    Consultants:  Vasc surg nephro  Procedures:   Antimicrobials:   Subjective: Pt c/o fatigue   Objective: Vitals:   11/22/23 1650 11/22/23 1950 11/23/23 0338 11/23/23 0823  BP: (!) 168/105 (!) 149/92 (!) 119/91 94/63  Pulse: 96 91 82 (!) 52  Resp: 19 20 16 16   Temp: (!) 97.5 F (36.4 C) 98.6 F (37 C) (!) 97.5 F (36.4 C) 98 F (36.7 C)  TempSrc: Oral Oral Oral Oral  SpO2: 100% 98% 92% 99%  Weight:      Height:        Intake/Output Summary (Last 24 hours) at 11/23/2023 1013 Last data filed at 11/22/2023 1650 Gross per 24 hour  Intake --  Output 2900 ml  Net -2900 ml   Filed Weights   11/22/23 0913  Weight: 73 kg    Examination:  General exam: Appears calm and comfortable  Respiratory system: Clear to auscultation. Respiratory effort  normal. Cardiovascular system: S1 & S2+. No rubs, gallops or clicks.  Gastrointestinal system: Abdomen is nondistended, soft and nontender. Normal bowel sounds heard. Central nervous system: Alert and oriented.Moves all extremities  Psychiatry: Judgement and insight appear normal. Mood & affect appropriate.     Data Reviewed: I have personally reviewed following labs and imaging studies  CBC: Recent Labs  Lab 11/17/23 0743 11/22/23 0921 11/23/23 0434  WBC 6.5 6.0 6.1  NEUTROABS 4.0  --   --   HGB 13.4 14.3 13.8  HCT 43.2 44.5 41.9  MCV 97.1 92.5 90.7  PLT 142* 194 169   Basic Metabolic Panel: Recent Labs  Lab 11/17/23 0856 11/22/23 0921 11/23/23 0434  NA 137 142 139  K 5.1 6.6* 4.6  CL 98 102 98  CO2 22 20* 24  GLUCOSE 295* 131* 131*  BUN 78* 133* 75*  CREATININE 10.30* 16.30* 11.05*  CALCIUM  8.6* 8.7* 8.1*   GFR: Estimated Creatinine Clearance: 6.5 mL/min (A) (by C-G formula based on SCr of 11.05 mg/dL (H)). Liver Function Tests: Recent Labs  Lab 11/22/23 0921 11/23/23 0434  AST 22 26  ALT 33 27  ALKPHOS 90 85  BILITOT 1.4* 1.7*  PROT 6.8 6.0*  ALBUMIN 3.2* 2.7*   No results for input(s): LIPASE, AMYLASE in the last 168 hours. No results for input(s): AMMONIA in the last 168 hours. Coagulation Profile:  Recent Labs  Lab 11/17/23 0743  INR 1.2   Cardiac Enzymes: No results for input(s): CKTOTAL, CKMB, CKMBINDEX, TROPONINI in the last 168 hours. BNP (last 3 results) No results for input(s): PROBNP in the last 8760 hours. HbA1C: Recent Labs    11/22/23 1902  HGBA1C 10.7*   CBG: Recent Labs  Lab 11/22/23 1104 11/22/23 1236 11/22/23 2037 11/23/23 0822  GLUCAP 143* 109* 72 148*   Lipid Profile: No results for input(s): CHOL, HDL, LDLCALC, TRIG, CHOLHDL, LDLDIRECT in the last 72 hours. Thyroid  Function Tests: No results for input(s): TSH, T4TOTAL, FREET4, T3FREE, THYROIDAB in the last 72 hours. Anemia  Panel: No results for input(s): VITAMINB12, FOLATE, FERRITIN, TIBC, IRON, RETICCTPCT in the last 72 hours. Sepsis Labs: No results for input(s): PROCALCITON, LATICACIDVEN in the last 168 hours.  Recent Results (from the past 240 hours)  Resp panel by RT-PCR (RSV, Flu A&B, Covid) Anterior Nasal Swab     Status: None   Collection Time: 11/22/23 11:27 AM   Specimen: Anterior Nasal Swab  Result Value Ref Range Status   SARS Coronavirus 2 by RT PCR NEGATIVE NEGATIVE Final    Comment: (NOTE) SARS-CoV-2 target nucleic acids are NOT DETECTED.  The SARS-CoV-2 RNA is generally detectable in upper respiratory specimens during the acute phase of infection. The lowest concentration of SARS-CoV-2 viral copies this assay can detect is 138 copies/mL. A negative result does not preclude SARS-Cov-2 infection and should not be used as the sole basis for treatment or other patient management decisions. A negative result may occur with  improper specimen collection/handling, submission of specimen other than nasopharyngeal swab, presence of viral mutation(s) within the areas targeted by this assay, and inadequate number of viral copies(<138 copies/mL). A negative result must be combined with clinical observations, patient history, and epidemiological information. The expected result is Negative.  Fact Sheet for Patients:  BloggerCourse.com  Fact Sheet for Healthcare Providers:  SeriousBroker.it  This test is no t yet approved or cleared by the [place]  FDA and  has been authorized for detection and/or diagnosis of SARS-CoV-2 by FDA under an Emergency Use Authorization (EUA). This EUA will remain  in effect (meaning this test can be used) for the duration of the COVID-19 declaration under Section 564(b)(1) of the Act, 21 U.S.C.section 360bbb-3(b)(1), unless the authorization is terminated  or revoked sooner.       Influenza A  by PCR NEGATIVE NEGATIVE Final   Influenza B by PCR NEGATIVE NEGATIVE Final    Comment: (NOTE) The Xpert Xpress SARS-CoV-2/FLU/RSV plus assay is intended as an aid in the diagnosis of influenza from Nasopharyngeal swab specimens and should not be used as a sole basis for treatment. Nasal washings and aspirates are unacceptable for Xpert Xpress SARS-CoV-2/FLU/RSV testing.  Fact Sheet for Patients: BloggerCourse.com  Fact Sheet for Healthcare Providers: SeriousBroker.it  This test is not yet approved or cleared by the [place]  FDA and has been authorized for detection and/or diagnosis of SARS-CoV-2 by FDA under an Emergency Use Authorization (EUA). This EUA will remain in effect (meaning this test can be used) for the duration of the COVID-19 declaration under Section 564(b)(1) of the Act, 21 U.S.C. section 360bbb-3(b)(1), unless the authorization is terminated or revoked.     Resp Syncytial Virus by PCR NEGATIVE NEGATIVE Final    Comment: (NOTE) Fact Sheet for Patients: BloggerCourse.com  Fact Sheet for Healthcare Providers: SeriousBroker.it  This test is not yet approved or cleared by the [place]  FDA and has been  authorized for detection and/or diagnosis of SARS-CoV-2 by FDA under an Emergency Use Authorization (EUA). This EUA will remain in effect (meaning this test can be used) for the duration of the COVID-19 declaration under Section 564(b)(1) of the Act, 21 U.S.C. section 360bbb-3(b)(1), unless the authorization is terminated or revoked.  Performed at [facility], [address], [place], [place] [address]          Radiology Studies: PERIPHERAL VASCULAR CATHETERIZATION Result Date: 11/22/2023 See surgical note for result.  DG Chest 1 View Result Date: 11/22/2023 CLINICAL DATA:  Patient states he has something wrong with his port in chest.  EXAM: CHEST  1 VIEW COMPARISON:  November 14, 2023 FINDINGS: There is stable right internal jugular venous catheter positioning. The cardiac silhouette is enlarged and unchanged in size. Mild prominence of the central pulmonary vasculature is noted. This is decreased in severity when compared to the prior study. There is no evidence of acute infiltrate, pleural effusion or pneumothorax. The visualized skeletal structures are unremarkable. IMPRESSION: Stable cardiomegaly with mild pulmonary vascular congestion. Electronically Signed   By: Suzen Dials M.D.   On: 11/22/2023 11:10        Scheduled Meds:  amLODipine   5 mg Oral BID   atorvastatin   40 mg Oral Daily   calcitRIOL   0.25 mcg Oral Daily   calcium  acetate  1,334 mg Oral TID WC   Chlorhexidine  Gluconate Cloth  6 each Topical Q0600   ferrous sulfate   225 mg Oral Daily   insulin  aspart  0-6 Units Subcutaneous TID WC   melatonin  5 mg Oral QHS   metoprolol  tartrate  50 mg Oral BID   torsemide   50 mg Oral Daily   Continuous Infusions:   LOS: 1 day       Anthony CHRISTELLA Pouch, MD [facility] Pager [phone]  If 7PM-7AM, please contact night-coverage [email or web address] 11/23/2023, 10:13 AM

## 2023-11-23 NOTE — Discharge Instructions (Signed)
 Some PCP options in [place] area- not a comprehensive list  [facility]- [phone] [facility]- [phone] [facility]- [phone] [facility]- [phone] [facility]- [phone] [facility]- [phone]  or [facility] Physician Referral Line [phone]

## 2023-11-23 NOTE — Progress Notes (Signed)
   11/23/23 1025  Spiritual Encounters  Type of Visit Initial  Care provided to: Pt and family (Wife is at bedside)  Conversation partners present during encounter Physician  Referral source Physician  Reason for visit Advance directives  OnCall Visit No  Spiritual Framework  Presenting Themes Meaning/purpose/sources of inspiration;Goals in life/care  Family Stress Factors Other (Comment) (Wife shared that Pt and she have only been married a few months and this has interrupted their newly wed time. She asked for prayer)  Interventions  Spiritual Care Interventions Made Established relationship of care and support;Compassionate presence;Normalization of emotions;Decision-making support/facilitation;Encouragement;Other (comment) (Provided AD information)  Intervention Outcomes  Outcomes Connection to spiritual care;Awareness around self/spiritual resourses;Connection to values and goals of care;Autonomy/agency;Awareness of support;Awareness of health  Advance Directives (For Healthcare)  Does Patient Have a Medical Advance Directive? No  Would patient like information on creating a medical advance directive? Yes (Inpatient - patient defers creating a medical advance directive at this time - Information given) (Pt and wife would like time to read and talk about the AD; I asked them to ask the nurse to call a chaplain if they have any questions or when they're ready to sign the AD)

## 2023-11-23 NOTE — Inpatient Diabetes Management (Addendum)
 Inpatient Diabetes Program Recommendations  AACE/ADA: New Consensus Statement on Inpatient Glycemic Control (2015)  Target Ranges:  Prepandial:   less than 140 mg/dL      Peak postprandial:   less than 180 mg/dL (1-2 hours)      Critically ill patients:  140 - 180 mg/dL    Latest Reference Range & Units 03/13/23 05:00 11/22/23 19:02  Hemoglobin A1C 4.8 - 5.6 % 8.5 (H) 10.7 (H)  260 mg/dl  (H): Data is abnormally high  Latest Reference Range & Units 11/22/23 11:04 11/22/23 12:36 11/22/23 20:37 11/23/23 08:22  Glucose-Capillary 70 - 99 mg/dL 856 (H)  5 units Novolog  + 50 ml D50% 109 (H) 72 148 (H)  (H): Data is abnormally high  Admit with: HD access dysfunction--Not able to have Dialysis for 1 week/ Hyperkalemia  History: DM, ESRD  Home DM Meds: Novolog  4-12 units per SSI        Current Orders: Novolog  0-6 units TID    CBGs stable To have Dialysis today Current A1c shows poor control at home   Addendum 11:30am--Met w/ pt at bedside prior to HD.  Pt told me he sees Channing Schaffer, NP with [facility] in [place].  Gets Insulin  Rxs refilled thru PCP.  Pt confirmed he uses Novolog  with Sliding scale at home.  Has CBG meter at home and told me he has his CBG checked prior to each HD session.  States sometimes the reading is >300.  Has taken other insulins in the past but can't remember their names.  We talked about his A1c being elevated at 10.7%--Pt surprised to hear it was so high.  Told me he has follow up appt with PCP soon but can't remember the date.  We reviewed healthy and safe CBGs for home.  Encouraged pt to take his CBG meter to next PCP appt for review.  Pt appreciative of visit.   --Will follow patient during hospitalization--  Adina Rudolpho Arrow RN, MSN, CDCES Diabetes Coordinator Inpatient Glycemic Control Team Team Pager: [phone] (8a-5p)

## 2023-11-23 NOTE — Progress Notes (Signed)
 [facility]  ROUNDING NOTE   Subjective:   Victor Hernandez is a 68 y.o. male with past medical history of ESRD on HD TTS, right IJ PermCath placement, history of left upper extremity fistula, hypertension, hyperlipidemia, diabetes mellitus type 2, coronary disease, chronic systolic heart failure ejection fraction 45 to 50%, severe pulmonary hypertension, anemia chronic kidney disease, secondary hyperparathyroidism who presented with a fall yesterday resulting in right leg pain. He is admitted for Hyperkalemia [E87.5] SOB (shortness of breath) [R06.02] ESRD on dialysis (HCC) [N18.6, Z99.2] Hemodialysis catheter dysfunction, initial encounter (HCC) [T82.41XA] Acute cough [R05.1]  Patient is known to our practice and receives outpatient dialysis treatments at [facility] on a TTS schedule, supervised by Dr Jimmie.  Patient seen sitting at bedside Partially contributed breakfast tray at bedside Continues to have cough while speaking Room air   Objective:  Vital signs in last 24 hours:  Temp:  [97.5 F (36.4 C)-98.6 F (37 C)] 98.4 F (36.9 C) (06/27 1209) Pulse Rate:  [52-96] 78 (06/27 1300) Resp:  [16-25] 18 (06/27 1300) BP: (94-168)/(63-105) 121/80 (06/27 1300) SpO2:  [92 %-100 %] 95 % (06/27 1300) Weight:  [79.4 kg] 79.4 kg (06/27 1209)  Weight change:  Filed Weights   11/22/23 0913 11/23/23 1209  Weight: 73 kg 79.4 kg    Intake/Output: I/O last 3 completed shifts: In: -  Out: 2900 [Other:2900]   Intake/Output this shift:  No intake/output data recorded.  Physical Exam: General: NAD  Head: Normocephalic, atraumatic. Moist oral mucosal membranes  Eyes: Anicteric  Lungs:  Crackles, [place], cough  Heart: Regular rate and rhythm  Abdomen:  Soft, nontender  Extremities:  No peripheral edema.  Neurologic: Alert and oriented  Skin: Warm,dry, no rash  Access: Rt internal jugular permcath    Basic Metabolic Panel: Recent Labs  Lab 11/17/23 0856  11/22/23 0921 11/23/23 0434  NA 137 142 139  K 5.1 6.6* 4.6  CL 98 102 98  CO2 22 20* 24  GLUCOSE 295* 131* 131*  BUN 78* 133* 75*  CREATININE 10.30* 16.30* 11.05*  CALCIUM  8.6* 8.7* 8.1*    Liver Function Tests: Recent Labs  Lab 11/22/23 0921 11/23/23 0434  AST 22 26  ALT 33 27  ALKPHOS 90 85  BILITOT 1.4* 1.7*  PROT 6.8 6.0*  ALBUMIN 3.2* 2.7*   No results for input(s): LIPASE, AMYLASE in the last 168 hours. No results for input(s): AMMONIA in the last 168 hours.  CBC: Recent Labs  Lab 11/17/23 0743 11/22/23 0921 11/23/23 0434  WBC 6.5 6.0 6.1  NEUTROABS 4.0  --   --   HGB 13.4 14.3 13.8  HCT 43.2 44.5 41.9  MCV 97.1 92.5 90.7  PLT 142* 194 169    Cardiac Enzymes: No results for input(s): CKTOTAL, CKMB, CKMBINDEX, TROPONINI in the last 168 hours.  BNP: Invalid input(s): POCBNP  CBG: Recent Labs  Lab 11/22/23 1104 11/22/23 1236 11/22/23 2037 11/23/23 0822 11/23/23 1138  GLUCAP 143* 109* 72 148* 174*    Microbiology: Results for orders placed or performed during the hospital encounter of 11/22/23  Resp panel by RT-PCR (RSV, Flu A&B, Covid) Anterior Nasal Swab     Status: None   Collection Time: 11/22/23 11:27 AM   Specimen: Anterior Nasal Swab  Result Value Ref Range Status   SARS Coronavirus 2 by RT PCR NEGATIVE NEGATIVE Final    Comment: (NOTE) SARS-CoV-2 target nucleic acids are NOT DETECTED.  The SARS-CoV-2 RNA is generally detectable in upper  respiratory specimens during the acute phase of infection. The lowest concentration of SARS-CoV-2 viral copies this assay can detect is 138 copies/mL. A negative result does not preclude SARS-Cov-2 infection and should not be used as the sole basis for treatment or other patient management decisions. A negative result may occur with  improper specimen collection/handling, submission of specimen other than nasopharyngeal swab, presence of viral mutation(s) within the areas targeted  by this assay, and inadequate number of viral copies(<138 copies/mL). A negative result must be combined with clinical observations, patient history, and epidemiological information. The expected result is Negative.  Fact Sheet for Patients:  BloggerCourse.com  Fact Sheet for Healthcare Providers:  SeriousBroker.it  This test is no t yet approved or cleared by the [place]  FDA and  has been authorized for detection and/or diagnosis of SARS-CoV-2 by FDA under an Emergency Use Authorization (EUA). This EUA will remain  in effect (meaning this test can be used) for the duration of the COVID-19 declaration under Section 564(b)(1) of the Act, 21 U.S.C.section 360bbb-3(b)(1), unless the authorization is terminated  or revoked sooner.       Influenza A by PCR NEGATIVE NEGATIVE Final   Influenza B by PCR NEGATIVE NEGATIVE Final    Comment: (NOTE) The Xpert Xpress SARS-CoV-2/FLU/RSV plus assay is intended as an aid in the diagnosis of influenza from Nasopharyngeal swab specimens and should not be used as a sole basis for treatment. Nasal washings and aspirates are unacceptable for Xpert Xpress SARS-CoV-2/FLU/RSV testing.  Fact Sheet for Patients: BloggerCourse.com  Fact Sheet for Healthcare Providers: SeriousBroker.it  This test is not yet approved or cleared by the [place]  FDA and has been authorized for detection and/or diagnosis of SARS-CoV-2 by FDA under an Emergency Use Authorization (EUA). This EUA will remain in effect (meaning this test can be used) for the duration of the COVID-19 declaration under Section 564(b)(1) of the Act, 21 U.S.C. section 360bbb-3(b)(1), unless the authorization is terminated or revoked.     Resp Syncytial Virus by PCR NEGATIVE NEGATIVE Final    Comment: (NOTE) Fact Sheet for Patients: BloggerCourse.com  Fact  Sheet for Healthcare Providers: SeriousBroker.it  This test is not yet approved or cleared by the [place]  FDA and has been authorized for detection and/or diagnosis of SARS-CoV-2 by FDA under an Emergency Use Authorization (EUA). This EUA will remain in effect (meaning this test can be used) for the duration of the COVID-19 declaration under Section 564(b)(1) of the Act, 21 U.S.C. section 360bbb-3(b)(1), unless the authorization is terminated or revoked.  Performed at [facility], [address] [place], [place] [address]     Coagulation Studies: No results for input(s): LABPROT, INR in the last 72 hours.  Urinalysis: No results for input(s): COLORURINE, LABSPEC, PHURINE, GLUCOSEU, HGBUR, BILIRUBINUR, KETONESUR, PROTEINUR, UROBILINOGEN, NITRITE, LEUKOCYTESUR in the last 72 hours.  Invalid input(s): APPERANCEUR    Imaging: PERIPHERAL VASCULAR CATHETERIZATION Result Date: 11/22/2023 See surgical note for result.  DG Chest 1 View Result Date: 11/22/2023 CLINICAL DATA:  Patient states he has something wrong with his port in chest. EXAM: CHEST  1 VIEW COMPARISON:  November 14, 2023 FINDINGS: There is stable right internal jugular venous catheter positioning. The cardiac silhouette is enlarged and unchanged in size. Mild prominence of the central pulmonary vasculature is noted. This is decreased in severity when compared to the prior study. There is no evidence of acute infiltrate, pleural effusion or pneumothorax. The visualized skeletal structures are unremarkable. IMPRESSION: Stable cardiomegaly with mild  pulmonary vascular congestion. Electronically Signed   By: Suzen Dials M.D.   On: 11/22/2023 11:10     Medications:     amLODipine   5 mg Oral BID   atorvastatin   40 mg Oral Daily   calcitRIOL   0.25 mcg Oral Daily   calcium  acetate  1,334 mg Oral TID WC   Chlorhexidine  Gluconate Cloth  6 each Topical Q0600    ferrous sulfate   225 mg Oral Daily   insulin  aspart  0-6 Units Subcutaneous TID WC   melatonin  5 mg Oral QHS   metoprolol  tartrate  50 mg Oral BID   torsemide   50 mg Oral Daily   albuterol , docusate sodium , morphine injection, ondansetron  **OR** ondansetron  (ZOFRAN ) IV, oxyCODONE   Assessment/ Plan:  Mr. Victor Hernandez is a 68 y.o.  male with past medical history of ESRD on HD TTS, right IJ PermCath placement, history of left upper extremity fistula, hypertension, hyperlipidemia, diabetes mellitus type 2, coronary disease, chronic systolic heart failure ejection fraction 45 to 50%, severe pulmonary hypertension, anemia chronic kidney disease, secondary hyperparathyroidism who presented with a fall and leg pain.  [facility]/TTS/right chest PermCath  End stage renal disease with hyperkalemia on disease on hemodialysis.  Patient received dialysis yesterday, UF 2.9 L achieved.  Patient continues to appear uncomfortable with cough, requiring supplemental oxygen.  Will perform short dialysis treatment today with UF goal 2 L.  Next treatment scheduled for Saturday to maintain outpatient schedule.  2. Malfunctioning HD access. Vascular consulted and performed a permcath exchange. Access functioning well.  Bloody drainage noted on dressing, not uncommon with elevated BUN.  3. Anemia of chronic kidney disease Lab Results  Component Value Date   HGB 13.8 11/23/2023    Hgb within optimal range. Will continue to monitor   4. Hypertension with chronic kidney disease.  Home regimen includes amlodipine , metoprolol , and torsemide . All restarted.  Blood pressure currently 118/83.    LOS: 1 Edwards Mckelvie 6/27/20251:05 PM

## 2023-11-24 DIAGNOSIS — T8241XA Breakdown (mechanical) of vascular dialysis catheter, initial encounter: Secondary | ICD-10-CM | POA: Diagnosis not present

## 2023-11-24 LAB — COMPREHENSIVE METABOLIC PANEL WITH GFR
ALT: 25 U/L (ref 0–44)
AST: 25 U/L (ref 15–41)
Albumin: 2.9 g/dL — ABNORMAL LOW (ref 3.5–5.0)
Alkaline Phosphatase: 87 U/L (ref 38–126)
Anion gap: 15 (ref 5–15)
BUN: 51 mg/dL — ABNORMAL HIGH (ref 8–23)
CO2: 23 mmol/L (ref 22–32)
Calcium: 8.4 mg/dL — ABNORMAL LOW (ref 8.9–10.3)
Chloride: 96 mmol/L — ABNORMAL LOW (ref 98–111)
Creatinine, Ser: 8.65 mg/dL — ABNORMAL HIGH (ref 0.61–1.24)
GFR, Estimated: 6 mL/min — ABNORMAL LOW (ref >60)
Glucose, Bld: 104 mg/dL — ABNORMAL HIGH (ref 70–99)
Potassium: 4 mmol/L (ref 3.5–5.1)
Sodium: 134 mmol/L — ABNORMAL LOW (ref 135–145)
Total Bilirubin: 1.5 mg/dL — ABNORMAL HIGH (ref 0.0–1.2)
Total Protein: 6.5 g/dL (ref 6.5–8.1)

## 2023-11-24 LAB — CBC
HCT: 43 % (ref 39.0–52.0)
Hemoglobin: 13.8 g/dL (ref 13.0–17.0)
MCH: 29.7 pg (ref 26.0–34.0)
MCHC: 32.1 g/dL (ref 30.0–36.0)
MCV: 92.7 fL (ref 80.0–100.0)
Platelets: 174 10*3/uL (ref 150–400)
RBC: 4.64 MIL/uL (ref 4.22–5.81)
RDW: 16.3 % — ABNORMAL HIGH (ref 11.5–15.5)
WBC: 5 10*3/uL (ref 4.0–10.5)
nRBC: 0 % (ref 0.0–0.2)

## 2023-11-24 LAB — GLUCOSE, CAPILLARY
Glucose-Capillary: 112 mg/dL — ABNORMAL HIGH (ref 70–99)
Glucose-Capillary: 289 mg/dL — ABNORMAL HIGH (ref 70–99)
Glucose-Capillary: 203 mg/dL — ABNORMAL HIGH (ref 70–99)

## 2023-11-24 MED ORDER — HALOPERIDOL 5 MG PO TABS
5.0000 mg | ORAL_TABLET | Freq: Four times a day (QID) | ORAL | Status: DC | PRN
  Administered 2023-11-24 – 2023-11-25 (×2): 5 mg via ORAL
  Filled 2023-11-24 (×3): qty 1

## 2023-11-24 MED ORDER — HEPARIN SODIUM (PORCINE) 1000 UNIT/ML IJ SOLN
INTRAMUSCULAR | Status: AC
  Filled 2023-11-24: qty 10

## 2023-11-24 NOTE — Progress Notes (Signed)
 Mobility Specialist - Progress Note   11/24/23 0835  Mobility  Activity Off unit   Pt off unit for HD during attempt. Will attempt at another date/time.   Otho Louder  Mobility Specialist  11/24/23 8:36 AM

## 2023-11-24 NOTE — Progress Notes (Signed)
 Patient becoming agitated and confused. Patient expresses frustration with bed and chair alarms being turned on despite education regarding patient being a fall risk. Patient states I think I need to go to the hospital, this RN explains to patient that he is at the hospital, but pt then becomes further agitated and states No I'm not. Patient then starts to raise voice. This RN attempts to de-escalate but patient becomes further agitated. Dr. Trudy notified, new order for Haldol PO ordered.

## 2023-11-24 NOTE — Progress Notes (Signed)
 PROGRESS NOTE    Victor Hernandez  FMW:[id number] DOB: [birth year] DOA: 11/22/2023 PCP: Patient, No Pcp Per   Assessment & Plan:   Principal Problem:   Hyperkalemia Active Problems:   ESRD on dialysis (HCC)   SOB (shortness of breath)   Encounter for dialysis catheter care [facility])  Assessment and Plan: HD catheter dysfunction: s/p placement of right internal jugular vein and removal of previous catheter on 11/22/23 as per vasc surg. HD again today as per nephro. Nephro following and recs  Acute metabolic encephalopathy: likely secondary to missing HD x 1 week b/c catheter was not functional. Mental status waxes and wanes     Hyperkalemia: resolved w/ HD   ESRD: on HD. Nephro following and recs apprec   Chronic diastolic CHF: fluid/volume management w/ HD   HTN: continue on metoprolol , amlodipine , torsemide     DM2: poorly controlled, HbA1c 10.7. Continue on SSI w/ accuchecks    HLD: continue on statin    Severe mitral regurg: management per cardio outpatient       DVT prophylaxis: SCDs Code Status:full  Family Communication: discussed pt's care w/ pt's wife and answered her questions  Disposition Plan: likely d/c back home  Level of care: Telemetry Cardiac  Status is: Inpatient Remains inpatient appropriate because: severity of illness    Consultants:  Vasc surg nephro  Procedures:   Antimicrobials:   Subjective: Pt c/o malaise   Objective: Vitals:   11/24/23 0317 11/24/23 0810 11/24/23 0812 11/24/23 0826  BP: 116/77  112/72 109/70  Pulse: (!) 53  (!) 55 76  Resp: 20  (!) 22 16  Temp: 98.5 F (36.9 C)  97.6 F (36.4 C)   TempSrc: Oral  Oral   SpO2: 97%  95% 96%  Weight:  80.3 kg    Height:        Intake/Output Summary (Last 24 hours) at 11/24/2023 0854 Last data filed at 11/23/2023 1535 Gross per 24 hour  Intake 0 ml  Output 2000 ml  Net -2000 ml   Filed Weights   11/23/23 1209 11/23/23 1535 11/24/23 0810  Weight: 79.4 kg 77.4 kg 80.3 kg     Examination:  General exam: Appears comfortable  Respiratory system: clear breath sounds b/l  Cardiovascular system: S1/S2+. No rubs or clicks  Gastrointestinal system: Abd is soft, NT, ND & hypoactive bowel sounds  Central nervous system: alert & awake. Moves all extremities  Psychiatry: Judgement and insight appears not at baseline. Flat mood and affect     Data Reviewed: I have personally reviewed following labs and imaging studies  CBC: Recent Labs  Lab 11/22/23 0921 11/23/23 0434 11/24/23 0825  WBC 6.0 6.1 5.0  HGB 14.3 13.8 13.8  HCT 44.5 41.9 43.0  MCV 92.5 90.7 92.7  PLT 194 169 174   Basic Metabolic Panel: Recent Labs  Lab 11/17/23 0856 11/22/23 0921 11/23/23 0434 11/24/23 0357  NA 137 142 139 134*  K 5.1 6.6* 4.6 4.0  CL 98 102 98 96*  CO2 22 20* 24 23  GLUCOSE 295* 131* 131* 104*  BUN 78* 133* 75* 51*  CREATININE 10.30* 16.30* 11.05* 8.65*  CALCIUM  8.6* 8.7* 8.1* 8.4*   GFR: Estimated Creatinine Clearance: 8.6 mL/min (A) (by C-G formula based on SCr of 8.65 mg/dL (H)). Liver Function Tests: Recent Labs  Lab 11/22/23 0921 11/23/23 0434 11/24/23 0357  AST 22 26 25   ALT 33 27 25  ALKPHOS 90 85 87  BILITOT 1.4* 1.7* 1.5*  PROT 6.8 6.0*  6.5  ALBUMIN 3.2* 2.7* 2.9*   No results for input(s): LIPASE, AMYLASE in the last 168 hours. No results for input(s): AMMONIA in the last 168 hours. Coagulation Profile: No results for input(s): INR, PROTIME in the last 168 hours.  Cardiac Enzymes: No results for input(s): CKTOTAL, CKMB, CKMBINDEX, TROPONINI in the last 168 hours. BNP (last 3 results) No results for input(s): PROBNP in the last 8760 hours. HbA1C: Recent Labs    11/22/23 1902  HGBA1C 10.7*   CBG: Recent Labs  Lab 11/22/23 2037 11/23/23 0822 11/23/23 1138 11/23/23 1606 11/23/23 2049  GLUCAP 72 148* 174* 79 216*   Lipid Profile: No results for input(s): CHOL, HDL, LDLCALC, TRIG, CHOLHDL,  LDLDIRECT in the last 72 hours. Thyroid  Function Tests: No results for input(s): TSH, T4TOTAL, FREET4, T3FREE, THYROIDAB in the last 72 hours. Anemia Panel: No results for input(s): VITAMINB12, FOLATE, FERRITIN, TIBC, IRON, RETICCTPCT in the last 72 hours. Sepsis Labs: No results for input(s): PROCALCITON, LATICACIDVEN in the last 168 hours.  Recent Results (from the past 240 hours)  Resp panel by RT-PCR (RSV, Flu A&B, Covid) Anterior Nasal Swab     Status: None   Collection Time: 11/22/23 11:27 AM   Specimen: Anterior Nasal Swab  Result Value Ref Range Status   SARS Coronavirus 2 by RT PCR NEGATIVE NEGATIVE Final    Comment: (NOTE) SARS-CoV-2 target nucleic acids are NOT DETECTED.  The SARS-CoV-2 RNA is generally detectable in upper respiratory specimens during the acute phase of infection. The lowest concentration of SARS-CoV-2 viral copies this assay can detect is 138 copies/mL. A negative result does not preclude SARS-Cov-2 infection and should not be used as the sole basis for treatment or other patient management decisions. A negative result may occur with  improper specimen collection/handling, submission of specimen other than nasopharyngeal swab, presence of viral mutation(s) within the areas targeted by this assay, and inadequate number of viral copies(<138 copies/mL). A negative result must be combined with clinical observations, patient history, and epidemiological information. The expected result is Negative.  Fact Sheet for Patients:  BloggerCourse.com  Fact Sheet for Healthcare Providers:  SeriousBroker.it  This test is no t yet approved or cleared by the [place]  FDA and  has been authorized for detection and/or diagnosis of SARS-CoV-2 by FDA under an Emergency Use Authorization (EUA). This EUA will remain  in effect (meaning this test can be used) for the duration of the COVID-19  declaration under Section 564(b)(1) of the Act, 21 U.S.C.section 360bbb-3(b)(1), unless the authorization is terminated  or revoked sooner.       Influenza A by PCR NEGATIVE NEGATIVE Final   Influenza B by PCR NEGATIVE NEGATIVE Final    Comment: (NOTE) The Xpert Xpress SARS-CoV-2/FLU/RSV plus assay is intended as an aid in the diagnosis of influenza from Nasopharyngeal swab specimens and should not be used as a sole basis for treatment. Nasal washings and aspirates are unacceptable for Xpert Xpress SARS-CoV-2/FLU/RSV testing.  Fact Sheet for Patients: BloggerCourse.com  Fact Sheet for Healthcare Providers: SeriousBroker.it  This test is not yet approved or cleared by the [place]  FDA and has been authorized for detection and/or diagnosis of SARS-CoV-2 by FDA under an Emergency Use Authorization (EUA). This EUA will remain in effect (meaning this test can be used) for the duration of the COVID-19 declaration under Section 564(b)(1) of the Act, 21 U.S.C. section 360bbb-3(b)(1), unless the authorization is terminated or revoked.     Resp Syncytial Virus by PCR NEGATIVE NEGATIVE  Final    Comment: (NOTE) Fact Sheet for Patients: BloggerCourse.com  Fact Sheet for Healthcare Providers: SeriousBroker.it  This test is not yet approved or cleared by the [place]  FDA and has been authorized for detection and/or diagnosis of SARS-CoV-2 by FDA under an Emergency Use Authorization (EUA). This EUA will remain in effect (meaning this test can be used) for the duration of the COVID-19 declaration under Section 564(b)(1) of the Act, 21 U.S.C. section 360bbb-3(b)(1), unless the authorization is terminated or revoked.  Performed at [facility], [address] [place], [place] [address]          Radiology Studies: PERIPHERAL VASCULAR CATHETERIZATION Result Date:  11/22/2023 See surgical note for result.  DG Chest 1 View Result Date: 11/22/2023 CLINICAL DATA:  Patient states he has something wrong with his port in chest. EXAM: CHEST  1 VIEW COMPARISON:  November 14, 2023 FINDINGS: There is stable right internal jugular venous catheter positioning. The cardiac silhouette is enlarged and unchanged in size. Mild prominence of the central pulmonary vasculature is noted. This is decreased in severity when compared to the prior study. There is no evidence of acute infiltrate, pleural effusion or pneumothorax. The visualized skeletal structures are unremarkable. IMPRESSION: Stable cardiomegaly with mild pulmonary vascular congestion. Electronically Signed   By: Suzen Dials M.D.   On: 11/22/2023 11:10        Scheduled Meds:  amLODipine   5 mg Oral BID   atorvastatin   40 mg Oral Daily   calcitRIOL   0.25 mcg Oral Daily   calcium  acetate  1,334 mg Oral TID WC   Chlorhexidine  Gluconate Cloth  6 each Topical Q0600   ferrous sulfate   225 mg Oral Daily   insulin  aspart  0-6 Units Subcutaneous TID WC   melatonin  5 mg Oral QHS   metoprolol  tartrate  50 mg Oral BID   torsemide   50 mg Oral Daily   Continuous Infusions:   LOS: 2 days       Anthony CHRISTELLA Pouch, MD [facility] Pager [phone]  If 7PM-7AM, please contact night-coverage [email or web address] 11/24/2023, 8:54 AM

## 2023-11-24 NOTE — Progress Notes (Signed)
  Received patient in bed to unit.   Informed consent signed and in chart.    TX duration:3:00     Transported by  Hand-off given to patient's nurse. Pt tolerated treatment.    Access used: Cath Access issues: none   Total UF removed: 2.0 Medication(s) given: none Post HD VS: wnl Post HD weight: 77.3     N. Faiz Weber LPN [facility]

## 2023-11-24 NOTE — Evaluation (Signed)
 Physical Therapy Evaluation Patient Details Name: Victor Hernandez MRN: [id number] DOB: Sep 15, 1955 Today's Date: 11/24/2023  History of Present Illness  68 year old male who presents s/p fall yesterday resulting in right leg pain and after not having HD x 1 week due to port dysfunction referred from HD for expedited vascular access and Urgent HD s/p  vascular procedure for new catheter. He is admitted for Hyperkalemia, SOB, ESRD on dialysis, HD catheter dysfunction, Acute cough. PMH of ESRD on HD TTS, right IJ PermCath placement, history of left upper extremity fistula, hypertension, hyperlipidemia, diabetes mellitus type 2, coronary disease, chronic systolic heart failure ejection fraction 45 to 50%, severe pulmonary hypertension, anemia chronic kidney disease, secondary hyperparathyroidism.  Clinical Impression  Pt is a pleasant 68 year old male who was admitted for hyperkalemia. Unable to assess bed mobility as pt sitting EOB upon start of session. STS transfer performed with supervision assist, no AD use. Initial assessment of standing balance performed with no AD and pt with x1 self-corrected LOB, verbalizing and demonstrating unsteadiness. Pt educated on use of RW and was administered, pt demonstration stability and no LOB. Ambulation x100' successful with RW, no LOB experienced and pt verbalizing he feels a lot more stable with use of AD. Pt demonstrates deficits with overall balance and strength, highlighting a need for additional PT services. PT recommending use of RW when ambulating to increase safety and prevent future LOB. PT to follow acutely as appropriate.          If plan is discharge home, recommend the following: A little help with walking and/or transfers;A little help with bathing/dressing/bathroom;Assist for transportation;Help with stairs or ramp for entrance   Can travel by private vehicle        Equipment Recommendations Rolling walker (2 wheels)  Recommendations for Other  Services       Functional Status Assessment Patient has had a recent decline in their functional status and demonstrates the ability to make significant improvements in function in a reasonable and predictable amount of time.     Precautions / Restrictions Precautions Precautions: Fall Recall of Precautions/Restrictions: Intact Restrictions Weight Bearing Restrictions Per Provider Order: No      Mobility  Bed Mobility               General bed mobility comments: seated EOB upon start of therapy    Transfers Overall transfer level: Needs assistance Equipment used: Rolling walker (2 wheels) Transfers: Sit to/from Stand Sit to Stand: Supervision, Contact guard assist           General transfer comment: supervision for STS from EOB, CGA for recliner, no initial use of AD    Ambulation/Gait Ambulation/Gait assistance: Contact guard assist Gait Distance (Feet): 100 Feet Assistive device: Rolling walker (2 wheels) Gait Pattern/deviations: Step-through pattern, Decreased step length - right, Decreased step length - left       General Gait Details: step through pattern, x1 self-corrected LOB without use of AD, ambulation with RW proved steady and no LOB  Stairs            Wheelchair Mobility     Tilt Bed    Modified Rankin (Stroke Patients Only)       Balance Overall balance assessment: Needs assistance Sitting-balance support: Feet supported Sitting balance-Leahy Scale: Good Sitting balance - Comments: sitting EOB   Standing balance support: Bilateral upper extremity supported, During functional activity Standing balance-Leahy Scale: Fair Standing balance comment: x1 self-corrected LOB without use of AD, ambulation with  RW proved steady and no LOB                             Pertinent Vitals/Pain Pain Assessment Pain Assessment: No/denies pain    Home Living Family/patient expects to be discharged to:: Private residence Living  Arrangements: Spouse/significant other Available Help at Discharge: Family;Available 24 hours/day Type of Home: House Home Access: Ramped entrance     Alternate Level Stairs-Number of Steps: 7 Home Layout: Two level Home Equipment: None Additional Comments: pt states he does not go up to second level of home, has no AD at home    Prior Function Prior Level of Function : Independent/Modified Independent;Driving             Mobility Comments: per wife, pt walks without AD at baseline, however over the last 2 months has had multiple falls and she has to provide CGA ADLs Comments: IND at baseline, however has fallen forward OOB/chair d/t difficulty with LB ADLs last 2 months; 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, per wife he still drives but it is very unsafe     Extremity/Trunk Assessment   Upper Extremity Assessment Upper Extremity Assessment: Generalized weakness    Lower Extremity Assessment Lower Extremity Assessment: Overall WFL for tasks assessed    Cervical / Trunk Assessment Cervical / Trunk Assessment: Kyphotic  Communication   Communication Communication: No apparent difficulties    Cognition Arousal: Alert Behavior During Therapy: WFL for tasks assessed/performed   PT - Cognitive impairments: No apparent impairments                         Following commands: Impaired Following commands impaired: Follows one step commands with increased time     Cueing Cueing Techniques: Verbal cues, Tactile cues     General Comments      Exercises General Exercises - Upper Extremity Shoulder ABduction: AROM, Strengthening, Both, 15 reps Elbow Flexion: AROM, Strengthening, Both, 15 reps General Exercises - Lower Extremity Long Arc Quad: AROM, Both, 10 reps, Seated Hip Flexion/Marching: AROM, Strengthening, Both, 15 reps, Seated   Assessment/Plan    PT Assessment Patient needs continued PT services  PT Problem List Decreased  strength;Decreased activity tolerance;Decreased mobility;Decreased balance;Decreased coordination;Decreased knowledge of use of DME;Decreased safety awareness       PT Treatment Interventions DME instruction;Gait training;Functional mobility training;Therapeutic activities;Therapeutic exercise;Balance training;Patient/family education    PT Goals (Current goals can be found in the Care Plan section)  Acute Rehab PT Goals Patient Stated Goal: to return home and improve strength and balance PT Goal Formulation: With patient Time For Goal Achievement: 12/08/23 Potential to Achieve Goals: Good Additional Goals Additional Goal #1: Pt will verbalize a decrease in falls to improve QoL and prevent risk of secondary injuries.    Frequency Min 2X/week     Co-evaluation               AM-PAC PT 6 Clicks Mobility  Outcome Measure Help needed turning from your back to your side while in a flat bed without using bedrails?: A Little Help needed moving from lying on your back to sitting on the side of a flat bed without using bedrails?: A Little Help needed moving to and from a bed to a chair (including a wheelchair)?: A Little Help needed standing up from a chair using your arms (e.g., wheelchair or bedside chair)?: A Little Help needed to walk in  hospital room?: A Little Help needed climbing 3-5 steps with a railing? : A Little 6 Click Score: 18    End of Session Equipment Utilized During Treatment: Gait belt Activity Tolerance: Patient tolerated treatment well Patient left: in chair;with call bell/phone within reach;with chair alarm set Nurse Communication: Mobility status PT Visit Diagnosis: Unsteadiness on feet (R26.81);Other abnormalities of gait and mobility (R26.89);Muscle weakness (generalized) (M62.81);History of falling (Z91.81)    Time: 9261-9244 PT Time Calculation (min) (ACUTE ONLY): 17 min   Charges:                   Ellar Hakala Romero-Perozo,  SPT  11/24/2023, 8:38 AM

## 2023-11-24 NOTE — Progress Notes (Signed)
 [facility]  ROUNDING NOTE   Subjective:   Victor Hernandez is a 68 y.o. male with past medical history of ESRD on HD TTS, right IJ PermCath placement, history of left upper extremity fistula, hypertension, hyperlipidemia, diabetes mellitus type 2, coronary disease, chronic systolic heart failure ejection fraction 45 to 50%, severe pulmonary hypertension, anemia chronic kidney disease, secondary hyperparathyroidism who presented with a fall yesterday resulting in right leg pain. He is admitted for Hyperkalemia [E87.5] SOB (shortness of breath) [R06.02] ESRD on dialysis (HCC) [N18.6, Z99.2] Hemodialysis catheter dysfunction, initial encounter (HCC) [T82.41XA] Acute cough [R05.1]  Patient is known to our practice and receives outpatient dialysis treatments at [facility] on a TTS schedule, supervised by Dr Jimmie.  Patient seen and evaluated during dialysis   HEMODIALYSIS FLOWSHEET:  Blood Flow Rate (mL/min): 400 mL/min Arterial Pressure (mmHg): -209.89 mmHg Venous Pressure (mmHg): 198.38 mmHg TMP (mmHg): -7.68 mmHg Ultrafiltration Rate (mL/min): 829 mL/min Dialysate Flow Rate (mL/min): 299 ml/min Dialysis Fluid Bolus: Normal Saline  Alert and oriented initially Has developed some confusion during treatment   Objective:  Vital signs in last 24 hours:  Temp:  [97.6 F (36.4 C)-98.5 F (36.9 C)] 97.6 F (36.4 C) (06/28 0812) Pulse Rate:  [53-97] 57 (06/28 1000) Resp:  [16-25] 25 (06/28 1000) BP: (100-139)/(70-91) 121/77 (06/28 1000) SpO2:  [92 %-99 %] 98 % (06/28 1000) Weight:  [77.4 kg-80.3 kg] 80.3 kg (06/28 0810)  Weight change: 6.4 kg Filed Weights   11/23/23 1209 11/23/23 1535 11/24/23 0810  Weight: 79.4 kg 77.4 kg 80.3 kg    Intake/Output: I/O last 3 completed shifts: In: 0  Out: 2000 [Other:2000]   Intake/Output this shift:  No intake/output data recorded.  Physical Exam: General: NAD  Head: Normocephalic, atraumatic. Moist oral mucosal  membranes  Eyes: Anicteric  Lungs:  Fiant wheeze, [place]  Heart: Regular rate and rhythm  Abdomen:  Soft, nontender  Extremities:  No peripheral edema.  Neurologic: Alert and oriented at time of exam  Skin: Warm,dry, no rash  Access: Rt internal jugular permcath    Basic Metabolic Panel: Recent Labs  Lab 11/22/23 0921 11/23/23 0434 11/24/23 0357  NA 142 139 134*  K 6.6* 4.6 4.0  CL 102 98 96*  CO2 20* 24 23  GLUCOSE 131* 131* 104*  BUN 133* 75* 51*  CREATININE 16.30* 11.05* 8.65*  CALCIUM  8.7* 8.1* 8.4*    Liver Function Tests: Recent Labs  Lab 11/22/23 0921 11/23/23 0434 11/24/23 0357  AST 22 26 25   ALT 33 27 25  ALKPHOS 90 85 87  BILITOT 1.4* 1.7* 1.5*  PROT 6.8 6.0* 6.5  ALBUMIN 3.2* 2.7* 2.9*   No results for input(s): LIPASE, AMYLASE in the last 168 hours. No results for input(s): AMMONIA in the last 168 hours.  CBC: Recent Labs  Lab 11/22/23 0921 11/23/23 0434 11/24/23 0825  WBC 6.0 6.1 5.0  HGB 14.3 13.8 13.8  HCT 44.5 41.9 43.0  MCV 92.5 90.7 92.7  PLT 194 169 174    Cardiac Enzymes: No results for input(s): CKTOTAL, CKMB, CKMBINDEX, TROPONINI in the last 168 hours.  BNP: Invalid input(s): POCBNP  CBG: Recent Labs  Lab 11/22/23 2037 11/23/23 0822 11/23/23 1138 11/23/23 1606 11/23/23 2049  GLUCAP 72 148* 174* 79 216*    Microbiology: Results for orders placed or performed during the hospital encounter of 11/22/23  Resp panel by RT-PCR (RSV, Flu A&B, Covid) Anterior Nasal Swab     Status: None   Collection  Time: 11/22/23 11:27 AM   Specimen: Anterior Nasal Swab  Result Value Ref Range Status   SARS Coronavirus 2 by RT PCR NEGATIVE NEGATIVE Final    Comment: (NOTE) SARS-CoV-2 target nucleic acids are NOT DETECTED.  The SARS-CoV-2 RNA is generally detectable in upper respiratory specimens during the acute phase of infection. The lowest concentration of SARS-CoV-2 viral copies this assay can detect is 138 copies/mL.  A negative result does not preclude SARS-Cov-2 infection and should not be used as the sole basis for treatment or other patient management decisions. A negative result may occur with  improper specimen collection/handling, submission of specimen other than nasopharyngeal swab, presence of viral mutation(s) within the areas targeted by this assay, and inadequate number of viral copies(<138 copies/mL). A negative result must be combined with clinical observations, patient history, and epidemiological information. The expected result is Negative.  Fact Sheet for Patients:  BloggerCourse.com  Fact Sheet for Healthcare Providers:  SeriousBroker.it  This test is no t yet approved or cleared by the [place]  FDA and  has been authorized for detection and/or diagnosis of SARS-CoV-2 by FDA under an Emergency Use Authorization (EUA). This EUA will remain  in effect (meaning this test can be used) for the duration of the COVID-19 declaration under Section 564(b)(1) of the Act, 21 U.S.C.section 360bbb-3(b)(1), unless the authorization is terminated  or revoked sooner.       Influenza A by PCR NEGATIVE NEGATIVE Final   Influenza B by PCR NEGATIVE NEGATIVE Final    Comment: (NOTE) The Xpert Xpress SARS-CoV-2/FLU/RSV plus assay is intended as an aid in the diagnosis of influenza from Nasopharyngeal swab specimens and should not be used as a sole basis for treatment. Nasal washings and aspirates are unacceptable for Xpert Xpress SARS-CoV-2/FLU/RSV testing.  Fact Sheet for Patients: BloggerCourse.com  Fact Sheet for Healthcare Providers: SeriousBroker.it  This test is not yet approved or cleared by the [place]  FDA and has been authorized for detection and/or diagnosis of SARS-CoV-2 by FDA under an Emergency Use Authorization (EUA). This EUA will remain in effect (meaning this test  can be used) for the duration of the COVID-19 declaration under Section 564(b)(1) of the Act, 21 U.S.C. section 360bbb-3(b)(1), unless the authorization is terminated or revoked.     Resp Syncytial Virus by PCR NEGATIVE NEGATIVE Final    Comment: (NOTE) Fact Sheet for Patients: BloggerCourse.com  Fact Sheet for Healthcare Providers: SeriousBroker.it  This test is not yet approved or cleared by the [place]  FDA and has been authorized for detection and/or diagnosis of SARS-CoV-2 by FDA under an Emergency Use Authorization (EUA). This EUA will remain in effect (meaning this test can be used) for the duration of the COVID-19 declaration under Section 564(b)(1) of the Act, 21 U.S.C. section 360bbb-3(b)(1), unless the authorization is terminated or revoked.  Performed at [facility], [address] [place], [place] [address]     Coagulation Studies: No results for input(s): LABPROT, INR in the last 72 hours.  Urinalysis: No results for input(s): COLORURINE, LABSPEC, PHURINE, GLUCOSEU, HGBUR, BILIRUBINUR, KETONESUR, PROTEINUR, UROBILINOGEN, NITRITE, LEUKOCYTESUR in the last 72 hours.  Invalid input(s): APPERANCEUR    Imaging: PERIPHERAL VASCULAR CATHETERIZATION Result Date: 11/22/2023 See surgical note for result.  DG Chest 1 View Result Date: 11/22/2023 CLINICAL DATA:  Patient states he has something wrong with his port in chest. EXAM: CHEST  1 VIEW COMPARISON:  November 14, 2023 FINDINGS: There is stable right internal jugular venous catheter positioning. The cardiac silhouette  is enlarged and unchanged in size. Mild prominence of the central pulmonary vasculature is noted. This is decreased in severity when compared to the prior study. There is no evidence of acute infiltrate, pleural effusion or pneumothorax. The visualized skeletal structures are unremarkable. IMPRESSION: Stable  cardiomegaly with mild pulmonary vascular congestion. Electronically Signed   By: Suzen Dials M.D.   On: 11/22/2023 11:10     Medications:     amLODipine   5 mg Oral BID   atorvastatin   40 mg Oral Daily   calcitRIOL   0.25 mcg Oral Daily   calcium  acetate  1,334 mg Oral TID WC   Chlorhexidine  Gluconate Cloth  6 each Topical Q0600   ferrous sulfate   225 mg Oral Daily   insulin  aspart  0-6 Units Subcutaneous TID WC   melatonin  5 mg Oral QHS   metoprolol  tartrate  50 mg Oral BID   torsemide   50 mg Oral Daily   albuterol , docusate sodium , morphine injection, ondansetron  **OR** ondansetron  (ZOFRAN ) IV, oxyCODONE   Assessment/ Plan:  Mr. JARYD DREW is a 68 y.o.  male with past medical history of ESRD on HD TTS, right IJ PermCath placement, history of left upper extremity fistula, hypertension, hyperlipidemia, diabetes mellitus type 2, coronary disease, chronic systolic heart failure ejection fraction 45 to 50%, severe pulmonary hypertension, anemia chronic kidney disease, secondary hyperparathyroidism who presented with a fall and leg pain.  [facility]/TTS/right chest PermCath  End stage renal disease with hyperkalemia on disease on hemodialysis.  Patient received treatment yesterday with 2L fluid removal. Receiving treatment today to maintain outpatient schedule. UF goal 1.5-2L as tolerated. Will reduce treatment by due confusion and decline in BUN.   2. Malfunctioning HD access. Vascular consulted and performed a permcath exchange. Access functioning well.  Bun has improved.   3. Anemia of chronic kidney disease Lab Results  Component Value Date   HGB 13.8 11/24/2023    Hgb within optimal range.   4. Hypertension with chronic kidney disease.  Home regimen includes amlodipine , metoprolol , and torsemide . All restarted.  Blood pressure 116/77 during dialysis    LOS: 2 Breeona Waid 6/28/202510:29 AM

## 2023-11-25 DIAGNOSIS — T8241XA Breakdown (mechanical) of vascular dialysis catheter, initial encounter: Secondary | ICD-10-CM | POA: Diagnosis not present

## 2023-11-25 LAB — COMPREHENSIVE METABOLIC PANEL WITH GFR
ALT: 24 U/L (ref 0–44)
AST: 23 U/L (ref 15–41)
Albumin: 2.8 g/dL — ABNORMAL LOW (ref 3.5–5.0)
Alkaline Phosphatase: 88 U/L (ref 38–126)
Anion gap: 11 (ref 5–15)
BUN: 42 mg/dL — ABNORMAL HIGH (ref 8–23)
CO2: 26 mmol/L (ref 22–32)
Calcium: 8.7 mg/dL — ABNORMAL LOW (ref 8.9–10.3)
Chloride: 99 mmol/L (ref 98–111)
Creatinine, Ser: 7.39 mg/dL — ABNORMAL HIGH (ref 0.61–1.24)
GFR, Estimated: 7 mL/min — ABNORMAL LOW (ref >60)
Glucose, Bld: 182 mg/dL — ABNORMAL HIGH (ref 70–99)
Potassium: 4.4 mmol/L (ref 3.5–5.1)
Sodium: 136 mmol/L (ref 135–145)
Total Bilirubin: 1.2 mg/dL (ref 0.0–1.2)
Total Protein: 6 g/dL — ABNORMAL LOW (ref 6.5–8.1)

## 2023-11-25 LAB — GLUCOSE, CAPILLARY
Glucose-Capillary: 158 mg/dL — ABNORMAL HIGH (ref 70–99)
Glucose-Capillary: 190 mg/dL — ABNORMAL HIGH (ref 70–99)

## 2023-11-25 NOTE — TOC Transition Note (Signed)
 Transition of Care [facility]) - Discharge Note   Patient Details  Name: Victor Hernandez MRN: [id number] Date of Birth: [birth year]  [facility] CM/SW Contact:  Racheal LITTIE Schimke, RN Phone Number: 11/25/2023, 1:27 PM    Final next level of care: Home w Home Health Services Barriers to Discharge: Barriers Resolved   Patient Goals and CMS Choice Patient states their goals for this hospitalization and ongoing recovery are:: Go home with [facility] CMS Medicare.gov Compare Post Acute Care list provided to:: Patient Choice offered to / list presented to : Patient      Discharge Placement                  Name of family member notified: Doyal Patient and family notified of of transfer: 11/25/23  Discharge Plan and Services Additional resources added to the After Visit Summary for                  DME Arranged: Walker rolling DME Agency: [employer] Date DME Agency Contacted: 11/25/23 Time DME Agency Contacted: 1325 Representative spoke with at DME Agency: [facility] HH Arranged: PT HH Agency: [facility] Date HH Agency Contacted: 11/25/23 Time HH Agency Contacted: 1326 Representative spoke with at [facility] Agency: [facility]  Social Drivers of Health (SDOH) Interventions SDOH Screenings   Food Insecurity: No Food Insecurity (11/22/2023)  Housing: High Risk (11/22/2023)  Transportation Needs: No Transportation Needs (11/22/2023)  Utilities: Not At Risk (11/22/2023)  Depression (PHQ2-9): Low Risk  (07/14/2021)  Social Connections: Moderately Isolated (11/22/2023)  Tobacco Use: Medium Risk (11/22/2023)     Readmission Risk Interventions     No data to display

## 2023-11-25 NOTE — Progress Notes (Signed)
 [facility]  ROUNDING NOTE   Subjective:   Victor Hernandez is a 68 y.o. male with past medical history of ESRD on HD TTS, right IJ PermCath placement, history of left upper extremity fistula, hypertension, hyperlipidemia, diabetes mellitus type 2, coronary disease, chronic systolic heart failure ejection fraction 45 to 50%, severe pulmonary hypertension, anemia chronic kidney disease, secondary hyperparathyroidism who presented with a fall yesterday resulting in right leg pain. He is admitted for Hyperkalemia [E87.5] SOB (shortness of breath) [R06.02] ESRD on dialysis (HCC) [N18.6, Z99.2] Hemodialysis catheter dysfunction, initial encounter (HCC) [T82.41XA] Acute cough [R05.1]  Patient is known to our practice and receives outpatient dialysis treatments at [facility] on a TTS schedule, supervised by Dr Jimmie.  Update Chart review states patient had confusion with agitation  Patient sitting up in chair Denies pain Alert to self, place and situation.  Objective:  Vital signs in last 24 hours:  Temp:  [97.7 F (36.5 C)-98.6 F (37 C)] 98.1 F (36.7 C) (06/29 0732) Pulse Rate:  [54-81] 54 (06/29 0732) Resp:  [16-25] 16 (06/29 0732) BP: (94-125)/(56-88) 119/78 (06/29 0732) SpO2:  [97 %-99 %] 97 % (06/29 0732) Weight:  [77.3 kg] 77.3 kg (06/28 1144)  Weight change: 0.9 kg Filed Weights   11/23/23 1535 11/24/23 0810 11/24/23 1144  Weight: 77.4 kg 80.3 kg 77.3 kg    Intake/Output: I/O last 3 completed shifts: In: 120 [P.O.:120] Out: 2000 [Other:2000]   Intake/Output this shift:  No intake/output data recorded.  Physical Exam: General: NAD  Head: Normocephalic, atraumatic. Moist oral mucosal membranes  Eyes: Anicteric  Lungs:  Diminished, Maish Vaya  Heart: Regular rate and rhythm  Abdomen:  Soft, nontender  Extremities:  No peripheral edema.  Neurologic: Alert and oriented x 3  Skin: Warm,dry, no rash  Access: Rt internal jugular permcath    Basic  Metabolic Panel: Recent Labs  Lab 11/22/23 0921 11/23/23 0434 11/24/23 0357 11/25/23 0514  NA 142 139 134* 136  K 6.6* 4.6 4.0 4.4  CL 102 98 96* 99  CO2 20* 24 23 26   GLUCOSE 131* 131* 104* 182*  BUN 133* 75* 51* 42*  CREATININE 16.30* 11.05* 8.65* 7.39*  CALCIUM  8.7* 8.1* 8.4* 8.7*    Liver Function Tests: Recent Labs  Lab 11/22/23 0921 11/23/23 0434 11/24/23 0357 11/25/23 0514  AST 22 26 25 23   ALT 33 27 25 24   ALKPHOS 90 85 87 88  BILITOT 1.4* 1.7* 1.5* 1.2  PROT 6.8 6.0* 6.5 6.0*  ALBUMIN 3.2* 2.7* 2.9* 2.8*   No results for input(s): LIPASE, AMYLASE in the last 168 hours. No results for input(s): AMMONIA in the last 168 hours.  CBC: Recent Labs  Lab 11/22/23 0921 11/23/23 0434 11/24/23 0825  WBC 6.0 6.1 5.0  HGB 14.3 13.8 13.8  HCT 44.5 41.9 43.0  MCV 92.5 90.7 92.7  PLT 194 169 174    Cardiac Enzymes: No results for input(s): CKTOTAL, CKMB, CKMBINDEX, TROPONINI in the last 168 hours.  BNP: Invalid input(s): POCBNP  CBG: Recent Labs  Lab 11/23/23 2049 11/24/23 1203 11/24/23 1546 11/24/23 2057 11/25/23 0731  GLUCAP 216* 112* 289* 203* 158*    Microbiology: Results for orders placed or performed during the hospital encounter of 11/22/23  Resp panel by RT-PCR (RSV, Flu A&B, Covid) Anterior Nasal Swab     Status: None   Collection Time: 11/22/23 11:27 AM   Specimen: Anterior Nasal Swab  Result Value Ref Range Status   SARS Coronavirus 2 by  RT PCR NEGATIVE NEGATIVE Final    Comment: (NOTE) SARS-CoV-2 target nucleic acids are NOT DETECTED.  The SARS-CoV-2 RNA is generally detectable in upper respiratory specimens during the acute phase of infection. The lowest concentration of SARS-CoV-2 viral copies this assay can detect is 138 copies/mL. A negative result does not preclude SARS-Cov-2 infection and should not be used as the sole basis for treatment or other patient management decisions. A negative result may occur with   improper specimen collection/handling, submission of specimen other than nasopharyngeal swab, presence of viral mutation(s) within the areas targeted by this assay, and inadequate number of viral copies(<138 copies/mL). A negative result must be combined with clinical observations, patient history, and epidemiological information. The expected result is Negative.  Fact Sheet for Patients:  BloggerCourse.com  Fact Sheet for Healthcare Providers:  SeriousBroker.it  This test is no t yet approved or cleared by the [place]  FDA and  has been authorized for detection and/or diagnosis of SARS-CoV-2 by FDA under an Emergency Use Authorization (EUA). This EUA will remain  in effect (meaning this test can be used) for the duration of the COVID-19 declaration under Section 564(b)(1) of the Act, 21 U.S.C.section 360bbb-3(b)(1), unless the authorization is terminated  or revoked sooner.       Influenza A by PCR NEGATIVE NEGATIVE Final   Influenza B by PCR NEGATIVE NEGATIVE Final    Comment: (NOTE) The Xpert Xpress SARS-CoV-2/FLU/RSV plus assay is intended as an aid in the diagnosis of influenza from Nasopharyngeal swab specimens and should not be used as a sole basis for treatment. Nasal washings and aspirates are unacceptable for Xpert Xpress SARS-CoV-2/FLU/RSV testing.  Fact Sheet for Patients: BloggerCourse.com  Fact Sheet for Healthcare Providers: SeriousBroker.it  This test is not yet approved or cleared by the [place]  FDA and has been authorized for detection and/or diagnosis of SARS-CoV-2 by FDA under an Emergency Use Authorization (EUA). This EUA will remain in effect (meaning this test can be used) for the duration of the COVID-19 declaration under Section 564(b)(1) of the Act, 21 U.S.C. section 360bbb-3(b)(1), unless the authorization is terminated or revoked.      Resp Syncytial Virus by PCR NEGATIVE NEGATIVE Final    Comment: (NOTE) Fact Sheet for Patients: BloggerCourse.com  Fact Sheet for Healthcare Providers: SeriousBroker.it  This test is not yet approved or cleared by the [place]  FDA and has been authorized for detection and/or diagnosis of SARS-CoV-2 by FDA under an Emergency Use Authorization (EUA). This EUA will remain in effect (meaning this test can be used) for the duration of the COVID-19 declaration under Section 564(b)(1) of the Act, 21 U.S.C. section 360bbb-3(b)(1), unless the authorization is terminated or revoked.  Performed at [facility], [address] [place], [place] [address]     Coagulation Studies: No results for input(s): LABPROT, INR in the last 72 hours.  Urinalysis: No results for input(s): COLORURINE, LABSPEC, PHURINE, GLUCOSEU, HGBUR, BILIRUBINUR, KETONESUR, PROTEINUR, UROBILINOGEN, NITRITE, LEUKOCYTESUR in the last 72 hours.  Invalid input(s): APPERANCEUR    Imaging: No results found.    Medications:     amLODipine   5 mg Oral BID   atorvastatin   40 mg Oral Daily   calcitRIOL   0.25 mcg Oral Daily   calcium  acetate  1,334 mg Oral TID WC   Chlorhexidine  Gluconate Cloth  6 each Topical Q0600   ferrous sulfate   225 mg Oral Daily   insulin  aspart  0-6 Units Subcutaneous TID WC   melatonin  5 mg  Oral QHS   metoprolol  tartrate  50 mg Oral BID   torsemide   50 mg Oral Daily   albuterol , docusate sodium , haloperidol, morphine injection, ondansetron  **OR** ondansetron  (ZOFRAN ) IV, oxyCODONE   Assessment/ Plan:  Mr. Victor Hernandez is a 68 y.o.  male with past medical history of ESRD on HD TTS, right IJ PermCath placement, history of left upper extremity fistula, hypertension, hyperlipidemia, diabetes mellitus type 2, coronary disease, chronic systolic heart failure ejection fraction 45 to 50%, severe pulmonary  hypertension, anemia chronic kidney disease, secondary hyperparathyroidism who presented with a fall and leg pain.  [facility]/TTS/right chest PermCath  End stage renal disease with hyperkalemia on disease on hemodialysis.  Patient received dialysis treatment yesterday. Treatment time decreased by 30 min due to confusion. This can be caused when BUN corrected quickly. Will allow patient to rest and plan for next treatment on Tuesday.   2. Malfunctioning HD access. Vascular consulted and performed a permcath exchange. Access functioning well with treatment.     3. Anemia of chronic kidney disease Lab Results  Component Value Date   HGB 13.8 11/24/2023    Hgb within optimal range.   4. Hypertension with chronic kidney disease.  Home regimen includes amlodipine , metoprolol , and torsemide . All restarted.  Blood pressure remains stable    LOS: 3 Takenya Travaglini 6/29/20259:46 AM

## 2023-11-25 NOTE — Discharge Summary (Addendum)
 Physician Discharge Summary  Victor Hernandez FMW:[id number] DOB: 05-24-1956 DOA: 11/22/2023  PCP: Victor Hernandez, No Pcp Per  Admit date: 11/22/2023 Discharge date: 11/25/2023  Admitted From: home Disposition:  home w/ home health   Recommendations for Outpatient Follow-up:  Follow up with PCP in 1-2 weeks F/u w/ nephro in 1-2 weeks  Home Health: yes Equipment/Devices:  Discharge Condition: stable  CODE STATUS: full  Diet recommendation: Heart Healthy / Carb Modified  Brief/Interim Summary: HPI was taken from Dr. Debby: icant of  hypertension, insulin -dependent type 2 diabetes mellitus, HFpEF, severe mitral regurg , ESRD on HD, who presents to ED after not having HD x 1 week due to port dysfunction.  Victor Hernandez was referred from HD for expedited vascular access and Urgent HD. Victor Hernandez s/p  vascular procedure currently on HD.  S/p medication Victor Hernandez in somnolent. He note current concerns denies chest pain , sob/ n/v/ but does note cough. Victor Hernandez not able to give further history regarding cough at this time.    ED Course:  Victor Hernandez was discussed with vascular and nephrology. Plan is for vascular to obtain HD access and there after Victor Hernandez will undergo HD.    Afeb, bp 137/97, hr 56, rr 18, sat 98%  EKG: sinus brady 1st degree AV block  Wbc 6, hgb 14.3, plt194  Na 142, K 6.6, cl 102, bicarb 20, cr 16.30 aG 20 BNP 3214.5 CE6 RVP-neg Cxr IMPRESSION: Stable cardiomegaly with mild pulmonary vascular congestion.    tx hyperkalemia protocol ,  lokelma  given  Discharge Diagnoses:  Principal Problem:   Hyperkalemia Active Problems:   ESRD on dialysis (HCC)   SOB (shortness of breath)   Encounter for dialysis catheter care [facility])  HD catheter dysfunction: s/p placement of right internal jugular vein and removal of previous catheter on 11/22/23 as per vasc surg. S/p HD yesterday. HD again on Tuesday & continue w/ TTS schedule. Nephro following and recs  Acute metabolic encephalopathy: likely  secondary to missing HD x 1 week b/c catheter was not functional. Mental status improved, AA&Ox3 today   Hyperkalemia: resolved w/ HD   ESRD: on HD. Nephro following and recs apprec   Chronic diastolic CHF: fluid/volume management w/ HD   HTN: continue on metoprolol , amlodipine , torsemide     DM2: poorly controlled, HbA1c 10.7. Continue on SSI w/ accuchecks    HLD: continue on statin    Severe mitral regurg: management per cardio outpatient   Discharge Instructions  Discharge Instructions     Diet Carb Modified   Complete by: As directed    Discharge instructions   Complete by: As directed    F/u w/ PCP in 1-2 weeks. F/u w/ nephro in 1-2 weeks   Increase activity slowly   Complete by: As directed       Allergies as of 11/25/2023   No Known Allergies      Medication List     TAKE these medications    acetaminophen  325 MG tablet Commonly known as: TYLENOL  Take 650 mg by mouth every 6 (six) hours as needed.   albuterol  108 (90 Base) MCG/ACT inhaler Commonly known as: VENTOLIN  HFA Inhale 2 puffs into the lungs every 6 (six) hours as needed for wheezing or shortness of breath.   amLODipine  5 MG tablet Commonly known as: NORVASC  Take 5 mg by mouth 2 (two) times daily.   aspirin  EC 81 MG tablet Take 1 tablet (81 mg total) by mouth daily.   atorvastatin  40 MG tablet Commonly known as: LIPITOR Take  40 mg by mouth daily.   blood glucose meter kit and supplies Dispense based on Victor Hernandez and insurance preference. Use up to four times daily as directed. (FOR ICD-10 E10.9, E11.9).   calcitRIOL  0.25 MCG capsule Commonly known as: ROCALTROL  Take 1 capsule (0.25 mcg total) by mouth daily.   calcium  acetate 667 MG capsule Commonly known as: PHOSLO  Take 2 capsules (1,334 mg total) by mouth 3 (three) times daily with meals.   docusate sodium  100 MG capsule Commonly known as: COLACE Take 100 mg by mouth daily as needed for mild constipation.   insulin  aspart 100  UNIT/ML injection Commonly known as: novoLOG  Inject 4-12 Units into the skin as directed. Sliding scale 150-200 = 4 units 201-250 = 6 units 251-300 = 8 units 301-350 = 10 units 351-400 = 12 units   melatonin 5 MG Tabs Take 1 tablet (5 mg total) by mouth at bedtime. What changed:  when to take this reasons to take this   metoprolol  tartrate 50 MG tablet Commonly known as: LOPRESSOR  Take 1 tablet (50 mg total) by mouth 2 (two) times daily.   multivitamin Tabs tablet Take 1 tablet by mouth at bedtime.   oxyCODONE -acetaminophen  5-325 MG tablet Commonly known as: Percocet Take 1 tablet by mouth every 6 (six) hours as needed (pain).   Slow Fe 142 (45 Fe) MG Tbcr Generic drug: Ferrous Sulfate  Take 1 tablet by mouth daily.   torsemide  10 MG tablet Commonly known as: DEMADEX  Take 5 tablets (50 mg total) by mouth daily.        No Known Allergies  Consultations: Nephro Vasc surg    Procedures/Studies: PERIPHERAL VASCULAR CATHETERIZATION Result Date: 11/22/2023 See surgical note for result.  DG Chest 1 View Result Date: 11/22/2023 CLINICAL DATA:  Victor Hernandez states he has something wrong with his port in chest. EXAM: CHEST  1 VIEW COMPARISON:  November 14, 2023 FINDINGS: There is stable right internal jugular venous catheter positioning. The cardiac silhouette is enlarged and unchanged in size. Mild prominence of the central pulmonary vasculature is noted. This is decreased in severity when compared to the prior study. There is no evidence of acute infiltrate, pleural effusion or pneumothorax. The visualized skeletal structures are unremarkable. IMPRESSION: Stable cardiomegaly with mild pulmonary vascular congestion. Electronically Signed   By: Suzen Dials M.D.   On: 11/22/2023 11:10   DG Chest 2 View Result Date: 11/14/2023 CLINICAL DATA:  Hypoxia. EXAM: CHEST - 2 VIEW COMPARISON:  None Available. FINDINGS: A right-sided venous catheter is seen with its distal tip overlying  the right atrium. The cardiac silhouette is enlarged and unchanged in size. Low lung volumes are noted. There is prominence of the central pulmonary vasculature. Mild atelectasis and/or infiltrate is noted within the right lung base. No pleural effusion or pneumothorax is identified. The visualized skeletal structures are unremarkable. IMPRESSION: 1. Cardiomegaly with mild pulmonary vascular congestion. 2. Mild right basilar atelectasis and/or infiltrate. Electronically Signed   By: Suzen Dials M.D.   On: 11/14/2023 18:21   PERIPHERAL VASCULAR CATHETERIZATION Result Date: 11/14/2023 Images from the original result were not included. Victor Hernandez name: Victor Hernandez MRN: [id number] DOB: [birth year] Sex: male 11/14/2023 Pre-operative Diagnosis: ESRD on HD, with slow maturation Post-operative diagnosis:  Same Surgeon:  Norman GORMAN Serve, MD Procedure Performed: Ultrasound-guided access of left arm aVF Fistulogram and central venogram Indications: Victor Hernandez is a 67 year old male with ESRD on HD.  He underwent brachiocephalic fistula creation on 09/19/2023.  This has been slow to mature and  he has been undergoing dialysis via right IJ TDC.  His last dialysis session was yesterday.  Risks and benefits of fistulogram with possible balloon assisted maturation were reviewed, he expressed understanding and elected to proceed. Findings: Widely patent central venous system.  The cephalic arch appears sclerotic and that the fistula is very well collateralized to the left IJ.  There is a large branch in the midsegment of the cephalic vein fistula.  The anastomosis is widely patent  Procedure:  The Victor Hernandez was identified in the holding area and taken to the cath lab  The Victor Hernandez was then placed supine on the table and prepped and draped in the usual sterile fashion.  A time out was called.  Ultrasound was used to evaluate the left arm AV access. This was accessed under u/s guidance. An 018 wire was advanced without resistance, a  micropuncture sheath was placed and fistulagram obtained which demonstrated the above findings. Contrast: 20cc Impression: Large branch of the mid segment of the cephalic vein fistula which will need to be ligated in the operating room. The cephalic arch appears sclerotic although the fistula is very well collateralized to the left IJ Norman GORMAN Serve MD [facility] Office: [phone]  VAS US  DUPLEX DIALYSIS ACCESS (AVF,AVG) Result Date: 11/05/2023 DIALYSIS ACCESS Victor Hernandez Name:  YERAY TOMAS  Date of Exam:   11/05/2023 Medical Rec #: [id number]     Accession #:    [id number] Date of Birth: 1955/11/14     Victor Hernandez Gender: M Victor Hernandez Age:   68 years Exam Location:  [address] Procedure:      VAS US  DUPLEX DIALYSIS ACCESS (AVF, AVG) Referring Phys: DONNICE SENDER --------------------------------------------------------------------------------  Reason for Exam: AVF maturation assessment. Access Site: Left Upper Extremity. Access Type: Brachial-cephalic AVF. Performing Technologist: Garnette Rockers  Examination Guidelines: A complete evaluation includes B-mode imaging, spectral Doppler, color Doppler, and power Doppler as needed of all accessible portions of each vessel. Unilateral testing is considered an integral part of a complete examination. Limited examinations for reoccurring indications may be performed as noted.  Findings: +--------------------+----------+-----------------+--------+ AVF                 PSV (cm/s)Flow Vol (mL/min)Comments +--------------------+----------+-----------------+--------+ Native artery inflow   113                              +--------------------+----------+-----------------+--------+ AVF Anastomosis        194          1172                +--------------------+----------+-----------------+--------+  +---------------+----------+-------------+----------+--------+ OUTFLOW VEIN   PSV (cm/s)Diameter (cm)Depth (cm)Describe  +---------------+----------+-------------+----------+--------+ Subclavian vein    59                                    +---------------+----------+-------------+----------+--------+ Shoulder          106        0.53        0.58            +---------------+----------+-------------+----------+--------+ Prox UA            65        0.59        0.47            +---------------+----------+-------------+----------+--------+ Mid UA  475        0.73        0.33   stenotic +---------------+----------+-------------+----------+--------+ Dist UA           146        0.59        0.31            +---------------+----------+-------------+----------+--------+   Summary: 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 *See table(s) above for measurements and observations.  Diagnosing physician: Gaile New MD Electronically signed by Gaile New MD on 11/05/2023 at 10:02:01 AM.    --------------------------------------------------------------------------------   Final    (Echo, Carotid, EGD, Colonoscopy, ERCP)    Subjective: Pt c/o fatigue    Discharge Exam: Vitals:   11/25/23 0416 11/25/23 0732  BP: 110/77 119/78  Pulse: (!) 54 (!) 54  Resp: 16 16  Temp: 97.7 F (36.5 C) 98.1 F (36.7 C)  SpO2: 99% 97%   Vitals:   11/24/23 1544 11/24/23 2011 11/25/23 0416 11/25/23 0732  BP: (!) 94/56 111/65 110/77 119/78  Pulse: (!) 55 60 (!) 54 (!) 54  Resp: 18 16 16 16   Temp: 98.6 F (37 C) 97.8 F (36.6 C) 97.7 F (36.5 C) 98.1 F (36.7 C)  TempSrc:  Oral Oral Oral  SpO2: 97% 98% 99% 97%  Weight:      Height:        General: Pt is alert, awake, not in acute distress Cardiovascular: S1/S2 +, no rubs, no gallops Respiratory: CTA bilaterally, no wheezing, no rhonchi Abdominal: Soft, NT, ND, bowel sounds + Extremities:  no cyanosis    The results of significant diagnostics from this hospitalization (including imaging, microbiology, ancillary and laboratory) are  listed below for reference.     Microbiology: Recent Results (from the past 240 hours)  Resp panel by RT-PCR (RSV, Flu A&B, Covid) Anterior Nasal Swab     Status: None   Collection Time: 11/22/23 11:27 AM   Specimen: Anterior Nasal Swab  Result Value Ref Range Status   SARS Coronavirus 2 by RT PCR NEGATIVE NEGATIVE Final    Comment: (NOTE) SARS-CoV-2 target nucleic acids are NOT DETECTED.  The SARS-CoV-2 RNA is generally detectable in upper respiratory specimens during the acute phase of infection. The lowest concentration of SARS-CoV-2 viral copies this assay can detect is 138 copies/mL. A negative result does not preclude SARS-Cov-2 infection and should not be used as the sole basis for treatment or other Victor Hernandez management decisions. A negative result may occur with  improper specimen collection/handling, submission of specimen other than nasopharyngeal swab, presence of viral mutation(s) within the areas targeted by this assay, and inadequate number of viral copies(<138 copies/mL). A negative result must be combined with clinical observations, Victor Hernandez history, and epidemiological information. The expected result is Negative.  Fact Sheet for Patients:  BloggerCourse.com  Fact Sheet for Healthcare Providers:  SeriousBroker.it  This test is no t yet approved or cleared by the [place]  FDA and  has been authorized for detection and/or diagnosis of SARS-CoV-2 by FDA under an Emergency Use Authorization (EUA). This EUA will remain  in effect (meaning this test can be used) for the duration of the COVID-19 declaration under Section 564(b)(1) of the Act, 21 U.S.C.section 360bbb-3(b)(1), unless the authorization is terminated  or revoked sooner.       Influenza A by PCR NEGATIVE NEGATIVE Final   Influenza B by PCR NEGATIVE NEGATIVE Final    Comment: (NOTE) The Xpert Xpress SARS-CoV-2/FLU/RSV plus assay is intended as an  aid in the diagnosis of influenza from Nasopharyngeal swab specimens and should not be used as a sole basis for treatment. Nasal washings and aspirates are unacceptable for Xpert Xpress SARS-CoV-2/FLU/RSV testing.  Fact Sheet for Patients: BloggerCourse.com  Fact Sheet for Healthcare Providers: SeriousBroker.it  This test is not yet approved or cleared by the [place]  FDA and has been authorized for detection and/or diagnosis of SARS-CoV-2 by FDA under an Emergency Use Authorization (EUA). This EUA will remain in effect (meaning this test can be used) for the duration of the COVID-19 declaration under Section 564(b)(1) of the Act, 21 U.S.C. section 360bbb-3(b)(1), unless the authorization is terminated or revoked.     Resp Syncytial Virus by PCR NEGATIVE NEGATIVE Final    Comment: (NOTE) Fact Sheet for Patients: BloggerCourse.com  Fact Sheet for Healthcare Providers: SeriousBroker.it  This test is not yet approved or cleared by the [place]  FDA and has been authorized for detection and/or diagnosis of SARS-CoV-2 by FDA under an Emergency Use Authorization (EUA). This EUA will remain in effect (meaning this test can be used) for the duration of the COVID-19 declaration under Section 564(b)(1) of the Act, 21 U.S.C. section 360bbb-3(b)(1), unless the authorization is terminated or revoked.  Performed at [facility], [address] [place], [place] [address]      Labs: BNP (last 3 results) Recent Labs    03/12/23 2151 11/14/23 1701 11/22/23 0921  BNP 2,517.8* 3,219.6* 3,214.5*   Basic Metabolic Panel: Recent Labs  Lab 11/22/23 0921 11/23/23 0434 11/24/23 0357 11/25/23 0514  NA 142 139 134* 136  K 6.6* 4.6 4.0 4.4  CL 102 98 96* 99  CO2 20* 24 23 26   GLUCOSE 131* 131* 104* 182*  BUN 133* 75* 51* 42*  CREATININE 16.30* 11.05* 8.65* 7.39*   CALCIUM  8.7* 8.1* 8.4* 8.7*   Liver Function Tests: Recent Labs  Lab 11/22/23 0921 11/23/23 0434 11/24/23 0357 11/25/23 0514  AST 22 26 25 23   ALT 33 27 25 24   ALKPHOS 90 85 87 88  BILITOT 1.4* 1.7* 1.5* 1.2  PROT 6.8 6.0* 6.5 6.0*  ALBUMIN 3.2* 2.7* 2.9* 2.8*   No results for input(s): LIPASE, AMYLASE in the last 168 hours. No results for input(s): AMMONIA in the last 168 hours. CBC: Recent Labs  Lab 11/22/23 0921 11/23/23 0434 11/24/23 0825  WBC 6.0 6.1 5.0  HGB 14.3 13.8 13.8  HCT 44.5 41.9 43.0  MCV 92.5 90.7 92.7  PLT 194 169 174   Cardiac Enzymes: No results for input(s): CKTOTAL, CKMB, CKMBINDEX, TROPONINI in the last 168 hours. BNP: Invalid input(s): POCBNP CBG: Recent Labs  Lab 11/24/23 1203 11/24/23 1546 11/24/23 2057 11/25/23 0731 11/25/23 1200  GLUCAP 112* 289* 203* 158* 190*   D-Dimer No results for input(s): DDIMER in the last 72 hours. Hgb A1c Recent Labs    11/22/23 1902  HGBA1C 10.7*   Lipid Profile No results for input(s): CHOL, HDL, LDLCALC, TRIG, CHOLHDL, LDLDIRECT in the last 72 hours. Thyroid  function studies No results for input(s): TSH, T4TOTAL, T3FREE, THYROIDAB in the last 72 hours.  Invalid input(s): FREET3 Anemia work up No results for input(s): VITAMINB12, FOLATE, FERRITIN, TIBC, IRON, RETICCTPCT in the last 72 hours. Urinalysis    Component Value Date/Time   COLORURINE STRAW (A) 03/30/2022 0936   APPEARANCEUR CLEAR (A) 03/30/2022 0936   LABSPEC 1.009 03/30/2022 0936   PHURINE 6.0 03/30/2022 0936   GLUCOSEU >=500 (A) 03/30/2022 0936   HGBUR SMALL (A) 03/30/2022 9063  BILIRUBINUR NEGATIVE 03/30/2022 0936   KETONESUR NEGATIVE 03/30/2022 0936   PROTEINUR >=300 (A) 03/30/2022 0936   NITRITE NEGATIVE 03/30/2022 0936   LEUKOCYTESUR NEGATIVE 03/30/2022 0936   Sepsis Labs Recent Labs  Lab 11/22/23 0921 11/23/23 0434 11/24/23 0825  WBC 6.0 6.1 5.0    Microbiology Recent Results (from the past 240 hours)  Resp panel by RT-PCR (RSV, Flu A&B, Covid) Anterior Nasal Swab     Status: None   Collection Time: 11/22/23 11:27 AM   Specimen: Anterior Nasal Swab  Result Value Ref Range Status   SARS Coronavirus 2 by RT PCR NEGATIVE NEGATIVE Final    Comment: (NOTE) SARS-CoV-2 target nucleic acids are NOT DETECTED.  The SARS-CoV-2 RNA is generally detectable in upper respiratory specimens during the acute phase of infection. The lowest concentration of SARS-CoV-2 viral copies this assay can detect is 138 copies/mL. A negative result does not preclude SARS-Cov-2 infection and should not be used as the sole basis for treatment or other Victor Hernandez management decisions. A negative result may occur with  improper specimen collection/handling, submission of specimen other than nasopharyngeal swab, presence of viral mutation(s) within the areas targeted by this assay, and inadequate number of viral copies(<138 copies/mL). A negative result must be combined with clinical observations, Victor Hernandez history, and epidemiological information. The expected result is Negative.  Fact Sheet for Patients:  BloggerCourse.com  Fact Sheet for Healthcare Providers:  SeriousBroker.it  This test is no t yet approved or cleared by the [place]  FDA and  has been authorized for detection and/or diagnosis of SARS-CoV-2 by FDA under an Emergency Use Authorization (EUA). This EUA will remain  in effect (meaning this test can be used) for the duration of the COVID-19 declaration under Section 564(b)(1) of the Act, 21 U.S.C.section 360bbb-3(b)(1), unless the authorization is terminated  or revoked sooner.       Influenza A by PCR NEGATIVE NEGATIVE Final   Influenza B by PCR NEGATIVE NEGATIVE Final    Comment: (NOTE) The Xpert Xpress SARS-CoV-2/FLU/RSV plus assay is intended as an aid in the diagnosis of influenza  from Nasopharyngeal swab specimens and should not be used as a sole basis for treatment. Nasal washings and aspirates are unacceptable for Xpert Xpress SARS-CoV-2/FLU/RSV testing.  Fact Sheet for Patients: BloggerCourse.com  Fact Sheet for Healthcare Providers: SeriousBroker.it  This test is not yet approved or cleared by the [place]  FDA and has been authorized for detection and/or diagnosis of SARS-CoV-2 by FDA under an Emergency Use Authorization (EUA). This EUA will remain in effect (meaning this test can be used) for the duration of the COVID-19 declaration under Section 564(b)(1) of the Act, 21 U.S.C. section 360bbb-3(b)(1), unless the authorization is terminated or revoked.     Resp Syncytial Virus by PCR NEGATIVE NEGATIVE Final    Comment: (NOTE) Fact Sheet for Patients: BloggerCourse.com  Fact Sheet for Healthcare Providers: SeriousBroker.it  This test is not yet approved or cleared by the [place]  FDA and has been authorized for detection and/or diagnosis of SARS-CoV-2 by FDA under an Emergency Use Authorization (EUA). This EUA will remain in effect (meaning this test can be used) for the duration of the COVID-19 declaration under Section 564(b)(1) of the Act, 21 U.S.C. section 360bbb-3(b)(1), unless the authorization is terminated or revoked.  Performed at [facility] [address], [place], [place] [address]      Time coordinating discharge: 33 minutes  SIGNED:   Anthony CHRISTELLA Pouch, MD  [facility] 11/25/2023, 1:02 PM  Pager   If 7PM-7AM, please contact night-coverage [email or web address]

## 2023-12-10 ENCOUNTER — Encounter (HOSPITAL_COMMUNITY): Payer: Self-pay | Admitting: Surgery

## 2023-12-10 ENCOUNTER — Other Ambulatory Visit: Payer: Self-pay

## 2023-12-10 NOTE — Progress Notes (Addendum)
 SDW CALL  Patient was given pre-op  instructions over the phone. The opportunity was given for the patient to ask questions. No further questions asked. Patient verbalized understanding of instructions given.   PCP - Channing Donal PIETY Cardiologist - Verdene Wilburn COME  PPM/ICD - denies Device Orders -  Rep Notified -   Chest x-ray - 11/22/23 EKG -11/22/23 Stress Test - 06/06/23 ECHO - 06/06/23 Cardiac Cath - denies  Sleep Study - denies CPAP - no  Fasting Blood Sugar - 115-140 Checks Blood Sugar when he goes to the doctor.  If your CBG is greater than 220 mg/dL the morning of surgery, you may take  of your sliding scale (correction) dose of Novolog  insulin . If your CBG is less than 220 the morning of surgery, do not take any Novolog  insulin .  Check your blood sugar the morning of your surgery when you wake up and every 2 hours until you get to the Short Stay unit.  If your blood sugar is less than 70 mg/dL, you will need to treat for low blood sugar: Do not take insulin . Treat a low blood sugar (less than 70 mg/dL) with  cup of clear juice (cranberry or apple), 4 glucose tablets, OR glucose gel. Recheck blood sugar in 15 minutes after treatment (to make sure it is greater than 70 mg/dL). If your blood sugar is not greater than 70 mg/dL on recheck, call [phone] for further instructions. Report your blood sugar to the short stay nurse when you get to Short Stay.   Blood Thinner Instructions:na Aspirin  Instructions:continue  ERAS Protcol -no PRE-SURGERY Ensure or G2-   COVID TEST- na   Anesthesia review: yes- CAD,HTN,MVR,pulmonary hypertension,AF  Patient denies shortness of breath, fever, cough and chest pain over the phone call   Special instructions:    Oral Hygiene is also important to reduce your risk of infection.  Remember - BRUSH YOUR TEETH THE MORNING OF SURGERY WITH YOUR REGULAR TOOTHPASTE

## 2023-12-11 NOTE — Progress Notes (Signed)
 Anesthesia Chart Review: SAME DAY WORK-UP  Case: [id number] Date/Time: 12/12/23 0816   Procedure: LIGATION OF ARTERIOVENOUS  FISTULA (Left)   Anesthesia type: Choice   Diagnosis: ESRD (end stage renal disease) (HCC) [N18.6]   Pre-op  diagnosis: ESRD   [facility]: [facility] ROOM 16 / [facility] OR   Surgeons: Serene Gaile ORN, MD       DISCUSSION: Patient is a 68 year old male scheduled for the above procedure. S/p creation of left brachiocephalic AVF on 09/19/23. AVF was slow to mature and has been using right internal jugular TDC for hemodialysis. Fistulogram findings on 11/14/23 included a large branch of the mid segment of the cephalic vein fistula which will need to be ligated in the operating room. Following his fistuolgram he was discharged home and was working on his tractor later that afternoon and felt SOB with near syncope. 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  He was given IV Lasix  and nephrology has performed hemodialysis while inpatient.  He is no longer short of breath and is able to ambulate without any symptomatic concerns or complaints noted.  He is overall in stable condition for discharge. He returned to the ED on 11/17/23 after his catheter began leaking in dialysis and noted to have a cracked arterial port. [facility] were consulted but unable to procure a repair kit for Permcath, so catheter exchange in the OR planned for 11/19/23, but he no showed twice, so catheter was not exchanged until 11/22/23 when he presented to the ED with hyperkalemia with K 6.6 with urgent dialysis afterwards. He stayed admitted until 11/25/23 due to acute metabolic encephalopathy, likely secondary to missing HD x 1 week due to access issues.     Other history includes former smoker (quit 05/29/74), post-operative N/V, HTN, HLD, CAD (coronary calcifications on CT 05/2020, 05/2023 stress  test suggestive of prior infarct LCX territory, no ischemia), CHF (diagnosed 06/2020), murmur (severe mitral regurgitation 05/2023), severe pulmonary hypertension (RVSP 85 mmHg 05/2023 TTE), NSVT (02/2023), afib (initially noted 02/20/23), DM2, exertional dyspnea, ESRD (on HD since 03/14/23, TTS), anemia, chronic microhemorrhage in right parietal lobe (in setting of hypertensive crisis/4/22).    He has intermittently been evaluated through cardiologists with [facility] during admission in 06/2020 and 02/2023. He has had known moderate to severe MR dating back to 2022. Echo in 02/2023 also showed severe pulmonary hypertension with RVSP 85 mmHg (previously 74.3 mmHg). He had 2 runs of NSVT lasting up to 6 beats during 02/2023 admission, and metoprolol  increased. Hemodialysis initiated that admission for acute on chronic CKD V with acute on chronic diastolic CHF. Out-patient cardiology follow-up recommended. Since then, he got established at [facility] with Dr. Keller Paterson. Initial visit on 05/11/23. Recommended Eliquis for rate controlled afib (not currently on medical list). Primary symptom was exertional dyspnea. 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 An echo and nuclear stress test were ordered (see below). 05/2023 TTE showed EF 50%, posterior and lateral wall hypokinesis, grade 2 DD, moderate LVH with speckled pattern, severe MR, mild-moderate TR, RVSP 85 mmHg.  He may ultimately need MV intervention, but would anticipate that [facility] would need to be out by then. Stress test suggestive of prior MI, but there was no significant ischemia. At 06/22/23 follow-up with Dr. Paterson, he discussed future TEE and RHC/LHC to further assess mitral valve and patient's coronary anatomy, but patient wished to defer.  Prior to undergoing AVF creation in April 2025, anesthesiologist recommended preoperative cardiology evaluation. Mr. Pohle was last evaluated by Dr. Wilburn on 08/31/23. Cardiac and volume  status felt stable at that time with fluid being managed with dialysis. Afib was rate controlled. He was felt optimized at that time. Continue medical therapy recommended and classified as moderate to high risk for access surgery.    Most recent TTE performed on 06/06/2023 revealed a low normal left ventricular systolic function with an EF of 50%. There was moderate LVH.  LV myocardium displayed a speckled pattern. Left ventricular diastolic Doppler parameters consistent with pseudonormalization (G2DD). There was posterior and lateral wall hypokinesis. Left atrium was severely enlarged. Right ventricle was enlarged with mild systolic dysfunction. RVSP severely elevated at 85 mmHg consistent with known severe pulmonary hypertension. There was severe eccentric mitral valve regurgitation. Additionally, there was mild to moderate tricuspid and trivial pulmonary valve regurgitation. All transvalvular gradients were noted to be normal providing no evidence suggestive of valvular stenosis. Aorta normal in size with no evidence of ectasia or aneurysmal dilatation. TEE was recommended for further evaluation of findings.    Most recent myocardial perfusion imaging study was performed on 06/06/2023 revealing a mildly reduced left ventricular systolic function with an EF of 41%. There was hypokinesis of the lateral wall noted. SPECT images demonstrated a medium size, moderate intensity, fixed defect noted in basal to mid inferolateral/anterolateral wall and basal inferior wall with associated hypokinesis suggestive of prior infarction in left circumflex territory. No was no evidence of significant reversible ischemia. No significant TID. Overall, study determined to be intermediate risk.    He is currently receiving hemodialysis via a right internal jugular tunneled dialysis catheter which was replaced on 11/22/23. He leads AVF branch ligation in hopes for AVF to mature.      Eliquis is not on his current medication  list. Medications include Lopressor  50 mg BID, amlodipine  5 mg BID, torsemide  50 md daily, Lipitor 40 mg daily, and ASA 81 mg daily.    A1c 10.7% on 11/22/23. By current medication list, he is on Novolog  SSI.    Anesthesia team to evaluate on the day of surgery.     VS:  Wt Readings from Last 3 Encounters:  11/24/23 77.3 kg  11/17/23 72.6 kg  11/15/23 68.9 kg   BP Readings from Last 3 Encounters:  11/25/23 119/78  11/17/23 121/69  11/15/23 123/82   Pulse Readings from Last 3 Encounters:  11/25/23 (!) 54  11/17/23 84  11/15/23 93     PROVIDERS: Donal Rosella, FNP-BC is PCP  Wilburn Fillers, MD is cardiologist [facility]) Dennise Capri, MD is nephrologist [facility])   LABS: For day of surgery. Most recent results in [facility] include: Lab Results  Component Value Date   WBC 5.0 11/24/2023   HGB 13.8 11/24/2023   HCT 43.0 11/24/2023   PLT 174 11/24/2023   GLUCOSE 182 (H) 11/25/2023   ALT 24 11/25/2023   AST 23 11/25/2023   NA 136 11/25/2023   K 4.4 11/25/2023   CL 99 11/25/2023   CREATININE 7.39 (H) 11/25/2023   BUN 42 (H) 11/25/2023   CO2 26 11/25/2023   TSH 2.465 03/30/2022   INR 1.2 11/17/2023   HGBA1C 10.7 (H) 11/22/2023     IMAGES: 1V CXR 11/22/23: FINDINGS: There is stable right internal jugular venous catheter positioning. The cardiac silhouette is enlarged and unchanged in size. Mild prominence of the central pulmonary vasculature is noted. This is  decreased in severity when compared to the prior study. There is no evidence of acute infiltrate, pleural effusion or pneumothorax. The visualized skeletal structures are unremarkable. IMPRESSION: Stable cardiomegaly with mild pulmonary vascular congestion.   EKG: EKG 11/22/23: Sinus rhythm with first-degree AV block with blocked premature atrial complexes Right axis deviation Possible anterior infarct, age undetermined Abnormal ECG  EKG 07/20/23: Atrial flutter with  variable A-V block Minimal voltage criteria for LVH, may be normal variant ( Cornell product ) Nonspecific ST and T wave abnormality Abnormal ECG When compared with ECG of 20-Feb-2023 14:01, Atrial flutter has replaced Atrial fibrillation Nonspecific T wave abnormality no longer evident in Inferior leads Confirmed by Perla Lye [phone]) on 07/27/2023 12:49:50 PM     CV: Nuclear stress test 06/16/23 ([facility]): Pharmacological stress test is abnormal.  SPECT images demonstrate medium size, moderate intensity, fixed defect in  basal to mid inferolateral/anterolateral wall and basal inferior wall with  associated hypokinesis suggestive of prior infarction in left circumflex  territory.  No ischemia, sum difference score 0.  Left ventricle normal in size with reduced LVEF of 41%.  No significant  TID.  Intermediate risk study.      Echo 06/06/23 ([facility]): CONCLUSION: Mild left ventricular systolic dysfunction with moderate LVH.  Estimated EF 50%.  Elevated LA pressures with diastolic dysfunction (Grade 2). Lateral and posterior wall hypokinesis. Mild right ventricular systolic dysfunction Valvular regurgitation: Trivial AR, Severe MR, Trivial PR, Mild TR No valvular stenosis. PHYSICIAN IMPRESSIONS: LV myocardium has speckled pattern. Mildly reduced LVEF with wall motion abnormality as below. Severe eccentric mitral regurgitation. Recommend TEE for further evaluation. Mild to moderate tricuspid regurgitation. Severe pulmonary hypertension, RVSP 85 mmHg. - Comparison echo 03/13/23: LVEF 45-50%, global LV hypokinesis, mildly dilated LV cavity, moderate LVH, grade III DD, elevated LA pressure, abnormal LV global longitudinal strain of -14.4%, normal RVSF, mild RVH, severely elevated PASO, RVSP 74.3 mmHg, severely dilated LA, small pericardial effusion, moderate-severe (may be underestimated) MR, mild-moderate TR, mild MR; 06/01/20: LVEF 50-55%, no RWMA, moderate LVH, moderate-severe MR.      Past Medical History:  Diagnosis Date   Anemia of chronic renal failure    Arthritis    CAD (coronary artery disease)    a. CT chest 05/31/2020: extensive CAD; b. MV 06/06/2023: EF 41%, mod sized fixed defect in basal to mid interolat/anterolat and basal inf wall associated with hypokinesis suggestive of prior infarct in LCx territory. No ischemia.   Cardiomegaly    Cerebral hemorrhage, nontraumatic (HCC)    a. brain MRI 06/01/2020: chronic microhemorrhage in the right parietal lobe   Cerebral microvascular disease    Chronic Heart failure with mid-range ejection fraction (HCC)    a.TTE 03/13/2023: EF 45-50%, glob HK, mild LV dil, mod LVH, G3DD, GLS -14.4%, RVSP 74.3, mod-sev MR, mild-mod TR, AoV sclerosis without stenosis; b.TTE 06/06/2023: EF 50%, mod LVH, G2DD, severe LAE, RAE/RVE,  mildly reduced RVSF, RVSP 85, triv AR/PR, sev MR   Depression    DOE (dyspnea on exertion)    ESRD (end stage renal disease) on dialysis (HCC)    Heart murmur    Hyperlipidemia    Hypertension    Insomnia    a. takes melatonin PRN   Long-term use of aspirin  therapy    NSVT (nonsustained ventricular tachycardia) (HCC)    PAF (paroxysmal atrial fibrillation) (HCC)    PONV (postoperative nausea and vomiting)    Severe mitral regurgitation    a. TTE 06/01/2020: mod-severe MR; b. TTE 03/13/2023:  mod-severe MR; c. TTE 06/06/2023: severe MR   Severe pulmonary hypertension (HCC)    a. TTE 03/13/2023: RVSP 74.3 mmHg; b. TTE 06/06/2023: RSVP 85 mmHg.   T2DM (type 2 diabetes mellitus) (HCC)     Past Surgical History:  Procedure Laterality Date   A/V SHUNT INTERVENTION N/A 11/14/2023   Procedure: A/V SHUNT INTERVENTION;  Surgeon: Pearline Norman RAMAN, MD;  Location: [facility];  Service: Cardiovascular;  Laterality: N/A;   AV FISTULA PLACEMENT Left 09/19/2023   Procedure: ARTERIOVENOUS (AV) FISTULA CREATION;  Surgeon: Serene Gaile ORN, MD;  Location: [facility] OR;  Service: Vascular;  Laterality: Left;   COLONOSCOPY      COLONOSCOPY WITH PROPOFOL  N/A 05/19/2016   Procedure: COLONOSCOPY WITH PROPOFOL ;  Surgeon: Louanne KANDICE Muse, MD;  Location: [facility] ENDOSCOPY;  Service: Endoscopy;  Laterality: N/A;   DIALYSIS/PERMA CATHETER INSERTION Right 03/19/2023   Procedure: DIALYSIS/PERMA CATHETER INSERTION;  Surgeon: Marea Selinda RAMAN, MD;  Location: [facility] INVASIVE CV LAB;  Service: Cardiovascular;  Laterality: Right;   DIALYSIS/PERMA CATHETER REPAIR Right 11/22/2023   Procedure: DIALYSIS/PERMA CATHETER REPAIR;  Surgeon: Marea Selinda RAMAN, MD;  Location: [facility] INVASIVE CV LAB;  Service: Cardiovascular;  Laterality: Right;   TEMPORARY DIALYSIS CATHETER N/A 03/13/2023   Procedure: TEMPORARY DIALYSIS CATHETER;  Surgeon: Jama Cordella KANDICE, MD;  Location: [facility] INVASIVE CV LAB;  Service: Cardiovascular;  Laterality: N/A;    MEDICATIONS: No current facility-administered medications for this encounter.    acetaminophen  (TYLENOL ) 325 MG tablet   albuterol  (VENTOLIN  HFA) 108 (90 Base) MCG/ACT inhaler   amLODipine  (NORVASC ) 5 MG tablet   aspirin  EC 81 MG tablet   atorvastatin  (LIPITOR) 40 MG tablet   blood glucose meter kit and supplies   calcitRIOL  (ROCALTROL ) 0.25 MCG capsule   calcium  acetate (PHOSLO ) 667 MG capsule   docusate sodium  (COLACE) 100 MG capsule   Ferrous Sulfate  (SLOW FE) 142 (45 Fe) MG TBCR   insulin  aspart (NOVOLOG ) 100 UNIT/ML injection   melatonin 5 MG TABS   metoprolol  tartrate (LOPRESSOR ) 50 MG tablet   multivitamin (RENA-VIT) TABS tablet   oxyCODONE -acetaminophen  (PERCOCET) 5-325 MG tablet   torsemide  (DEMADEX ) 10 MG tablet    Isaiah Ruder, PA-C Surgical Short Stay/Anesthesiology [facility] Phone [phone] [facility] Phone [phone] 12/11/2023 2:17 PM

## 2023-12-11 NOTE — Anesthesia Preprocedure Evaluation (Addendum)
 Anesthesia Evaluation  Patient identified by MRN, date of birth, ID band Patient awake    Reviewed: Allergy & Precautions, NPO status , Patient's Chart, lab work & pertinent test results  History of Anesthesia Complications (+) PONV and history of anesthetic complications  Airway Mallampati: III  TM Distance: >3 FB Neck ROM: Full    Dental  (+) Poor Dentition, Dental Advisory Given, Edentulous Upper   Pulmonary neg shortness of breath, neg sleep apnea, neg COPD, neg recent URI, Patient abstained from smoking., former smoker   Pulmonary exam normal breath sounds clear to auscultation       Cardiovascular hypertension (amlodipine , metoprolol ), Pt. on medications and Pt. on home beta blockers pulmonary hypertension (severe)+ CAD and +CHF  (-) Past MI, (-) Cardiac Stents and (-) CABG + dysrhythmias (1st degree AV block, PACs) Atrial Fibrillation and Supra Ventricular Tachycardia + Valvular Problems/Murmurs (severe MR, mild-to-moderate TR)  Rhythm:Regular Rate:Normal  HLD  Myocardial Perfusion Imaging 06/06/2023: Impression  Pharmacological stress test is abnormal. SPECT images demonstrate medium size, moderate intensity, fixed defect in basal to mid inferolateral/anterolateral wall and basal inferior wall with associated hypokinesis suggestive of prior infarction in left circumflex territory.  No ischemia, sum difference score 0. Left ventricle normal in size with reduced LVEF of 41%.  No significant TID. Intermediate risk study.  TTE 06/06/2023: CONCLUSION -------------------------------------------------------------------------------  MILD LEFT VENTRICULAR SYSTOLIC DYSFUNCTION WITH MODERATE LVH  ESTIMATED EF: 50%  ELEVATED LA PRESSURES WITH DIASTOLIC DYSFUNCTION (GRADE 2)  MILD RIGHT VENTRICULAR SYSTOLIC DYSFUNCTION  VALVULAR REGURGITATION: TRIVIAL AR, SEVERE MR, TRIVIAL PR, MILD TR  NO VALVULAR STENOSIS  PHYSICIAN IMPRESSIONS  --------------------------------------------------------------------  LV myocardium has speckled pattern. Mildly reduced LVEF with wall motion abnormality as below.  Severe eccentric mitral regurgitation. Recommend TEE for further evaluation.  Mild to moderate tricuspid regurgitation.  Severe pulmonary hypertension, RVSP 85 mmHg     Neuro/Psych neg Seizures PSYCHIATRIC DISORDERS  Depression    H/o cerebral hemorrhage    GI/Hepatic negative GI ROS, Neg liver ROS,,,  Endo/Other  diabetes (Hgb A1c 10.7), Poorly Controlled, Type 2, Insulin  Dependent    Renal/GU ESRF and DialysisRenal disease     Musculoskeletal  (+) Arthritis ,    Abdominal   Peds  Hematology  (+) Blood dyscrasia, anemia Lab Results      Component                Value               Date                      WBC                      5.0                 11/24/2023                HGB                      13.8                11/24/2023                HCT                      43.0                11/24/2023  MCV                      92.7                11/24/2023                PLT                      174                 11/24/2023              Anesthesia Other Findings Patient had profound hypotension after previous induction for AV fistula placement.  Reproductive/Obstetrics                              Anesthesia Physical Anesthesia Plan  ASA: 4  Anesthesia Plan: Regional   Post-op Pain Management: Tylenol  PO (pre-op )* and Regional block*   Induction: Intravenous  PONV Risk Score and Plan: 3 and Ondansetron , Dexamethasone and Treatment may vary due to age or medical condition  Airway Management Planned: Natural Airway and Simple Face Mask  Additional Equipment:   Intra-op Plan:   Post-operative Plan:   Informed Consent: I have reviewed the patients History and Physical, chart, labs and discussed the procedure including the risks, benefits and alternatives for  the proposed anesthesia with the patient or authorized representative who has indicated his/her understanding and acceptance.     Dental advisory given  Plan Discussed with:   Anesthesia Plan Comments: (Discussed potential risks of nerve blocks including, but not limited to, infection, bleeding, nerve damage, seizures, pneumothorax, respiratory depression, and potential failure of the block. Alternatives to nerve blocks discussed. All questions answered.  Discussed with patient risks of MAC including, but not limited to, minor pain or discomfort, hearing people in the room, and possible need for backup general anesthesia. Risks for general anesthesia also discussed including, but not limited to, sore throat, hoarse voice, chipped/damaged teeth, injury to vocal cords, nausea and vomiting, allergic reactions, lung infection, heart attack, stroke, and death. All questions answered.   PAT note written 12/11/2023 by Allison Zelenak, PA-C.  Nuclear stress test 06/16/23 ([facility]): Pharmacological stress test is abnormal.  SPECT images demonstrate medium size, moderate intensity, fixed defect in  basal to mid inferolateral/anterolateral wall and basal inferior wall with  associated hypokinesis suggestive of prior infarction in left circumflex  territory.  No ischemia, sum difference score 0.  Left ventricle normal in size with reduced LVEF of 41%.  No significant  TID.  Intermediate risk study.      Echo 06/06/23 ([facility]): CONCLUSION: 1. Mild left ventricular systolic dysfunction with moderate LVH.  Estimated EF 50%.  Elevated LA pressures with diastolic dysfunction (Grade 2). Lateral and posterior wall hypokinesis. 2. Mild right ventricular systolic dysfunction 3. Valvular regurgitation: Trivial AR, Severe MR, Trivial PR, Mild TR 4. No valvular stenosis. PHYSICIAN IMPRESSIONS: LV myocardium has speckled pattern. Mildly reduced LVEF with wall motion abnormality as below. Severe eccentric mitral  regurgitation. Recommend TEE for further evaluation. Mild to moderate tricuspid regurgitation. Severe pulmonary hypertension, RVSP 85 mmHg. - Comparison echo 03/13/23: LVEF 45-50%, global LV hypokinesis, mildly dilated LV cavity, moderate LVH, grade III DD, elevated LA pressure, abnormal LV global longitudinal strain of -14.4%, normal RVSF, mild RVH, severely elevated PASO, RVSP 74.3 mmHg, severely dilated LA, small pericardial effusion, moderate-severe (may be underestimated)  MR, mild-moderate TR, mild MR; 06/01/20: LVEF 50-55%, no RWMA, moderate LVH, moderate-severe MR.    )         Anesthesia Quick Evaluation

## 2023-12-12 ENCOUNTER — Ambulatory Visit (HOSPITAL_COMMUNITY): Payer: Self-pay | Admitting: Vascular Surgery

## 2023-12-12 ENCOUNTER — Ambulatory Visit (HOSPITAL_COMMUNITY)
Admission: RE | Admit: 2023-12-12 | Discharge: 2023-12-12 | Disposition: A | Discharge status: Home / Self Care | Attending: Surgery | Admitting: Surgery

## 2023-12-12 ENCOUNTER — Encounter (HOSPITAL_COMMUNITY)
Admission: RE | Disposition: A | Discharge status: Home / Self Care | Payer: Self-pay | Source: Home / Self Care | Attending: Surgery

## 2023-12-12 ENCOUNTER — Other Ambulatory Visit (HOSPITAL_COMMUNITY): Payer: Self-pay

## 2023-12-12 ENCOUNTER — Other Ambulatory Visit: Payer: Self-pay

## 2023-12-12 ENCOUNTER — Encounter (HOSPITAL_COMMUNITY): Payer: Self-pay | Admitting: Surgery

## 2023-12-12 DIAGNOSIS — I132 Hypertensive heart and chronic kidney disease with heart failure and with stage 5 chronic kidney disease, or end stage renal disease: Secondary | ICD-10-CM | POA: Insufficient documentation

## 2023-12-12 DIAGNOSIS — I480 Paroxysmal atrial fibrillation: Secondary | ICD-10-CM | POA: Insufficient documentation

## 2023-12-12 DIAGNOSIS — E1165 Type 2 diabetes mellitus with hyperglycemia: Secondary | ICD-10-CM | POA: Insufficient documentation

## 2023-12-12 DIAGNOSIS — T82590A Other mechanical complication of surgically created arteriovenous fistula, initial encounter: Secondary | ICD-10-CM

## 2023-12-12 DIAGNOSIS — I081 Rheumatic disorders of both mitral and tricuspid valves: Secondary | ICD-10-CM | POA: Insufficient documentation

## 2023-12-12 DIAGNOSIS — D631 Anemia in chronic kidney disease: Secondary | ICD-10-CM | POA: Diagnosis not present

## 2023-12-12 DIAGNOSIS — I2510 Atherosclerotic heart disease of native coronary artery without angina pectoris: Secondary | ICD-10-CM | POA: Insufficient documentation

## 2023-12-12 DIAGNOSIS — I5043 Acute on chronic combined systolic (congestive) and diastolic (congestive) heart failure: Secondary | ICD-10-CM | POA: Diagnosis not present

## 2023-12-12 DIAGNOSIS — E785 Hyperlipidemia, unspecified: Secondary | ICD-10-CM | POA: Insufficient documentation

## 2023-12-12 DIAGNOSIS — I272 Pulmonary hypertension, unspecified: Secondary | ICD-10-CM | POA: Insufficient documentation

## 2023-12-12 DIAGNOSIS — I440 Atrioventricular block, first degree: Secondary | ICD-10-CM | POA: Diagnosis not present

## 2023-12-12 DIAGNOSIS — I5042 Chronic combined systolic (congestive) and diastolic (congestive) heart failure: Secondary | ICD-10-CM | POA: Insufficient documentation

## 2023-12-12 DIAGNOSIS — Z992 Dependence on renal dialysis: Secondary | ICD-10-CM | POA: Insufficient documentation

## 2023-12-12 DIAGNOSIS — N186 End stage renal disease: Secondary | ICD-10-CM

## 2023-12-12 DIAGNOSIS — Z8673 Personal history of transient ischemic attack (TIA), and cerebral infarction without residual deficits: Secondary | ICD-10-CM | POA: Insufficient documentation

## 2023-12-12 DIAGNOSIS — Y832 Surgical operation with anastomosis, bypass or graft as the cause of abnormal reaction of the patient, or of later complication, without mention of misadventure at the time of the procedure: Secondary | ICD-10-CM | POA: Diagnosis not present

## 2023-12-12 DIAGNOSIS — I471 Supraventricular tachycardia, unspecified: Secondary | ICD-10-CM | POA: Insufficient documentation

## 2023-12-12 DIAGNOSIS — E1122 Type 2 diabetes mellitus with diabetic chronic kidney disease: Secondary | ICD-10-CM | POA: Diagnosis not present

## 2023-12-12 DIAGNOSIS — Z794 Long term (current) use of insulin: Secondary | ICD-10-CM | POA: Diagnosis not present

## 2023-12-12 HISTORY — PX: LIGATION OF ARTERIOVENOUS  FISTULA: SHX5948

## 2023-12-12 LAB — GLUCOSE, CAPILLARY
Glucose-Capillary: 185 mg/dL — ABNORMAL HIGH (ref 70–99)
Glucose-Capillary: 211 mg/dL — ABNORMAL HIGH (ref 70–99)

## 2023-12-12 LAB — POCT I-STAT, CHEM 8
BUN: 42 mg/dL — ABNORMAL HIGH (ref 8–23)
Calcium, Ion: 1.11 mmol/L — ABNORMAL LOW (ref 1.15–1.40)
Chloride: 106 mmol/L (ref 98–111)
Creatinine, Ser: 7.2 mg/dL — ABNORMAL HIGH (ref 0.61–1.24)
Glucose, Bld: 190 mg/dL — ABNORMAL HIGH (ref 70–99)
HCT: 42 % (ref 39.0–52.0)
Hemoglobin: 14.3 g/dL (ref 13.0–17.0)
Potassium: 4.8 mmol/L (ref 3.5–5.1)
Sodium: 142 mmol/L (ref 135–145)
TCO2: 28 mmol/L (ref 22–32)

## 2023-12-12 SURGERY — LIGATION OF ARTERIOVENOUS  FISTULA
Anesthesia: Regional | Laterality: Left

## 2023-12-12 MED ORDER — VASOPRESSIN 20 UNITS/100 ML INFUSION FOR SHOCK
INTRAVENOUS | Status: DC | PRN
  Administered 2023-12-12: .04 [IU]/min via INTRAVENOUS

## 2023-12-12 MED ORDER — LIDOCAINE 2% (20 MG/ML) 5 ML SYRINGE
INTRAMUSCULAR | Status: AC
  Filled 2023-12-12: qty 5

## 2023-12-12 MED ORDER — SODIUM CHLORIDE 0.9 % IV SOLN: INTRAVENOUS | Status: DC

## 2023-12-12 MED ORDER — SODIUM CHLORIDE (PF) 0.9 % IJ SOLN
INTRAMUSCULAR | Status: AC
  Filled 2023-12-12: qty 20

## 2023-12-12 MED ORDER — CHLORHEXIDINE GLUCONATE 4 % EX SOLN: 60.0000 mL | Freq: Once | CUTANEOUS | Status: DC

## 2023-12-12 MED ORDER — OXYCODONE HCL 5 MG/5ML PO SOLN: 5.0000 mg | Freq: Once | ORAL | Status: DC | PRN

## 2023-12-12 MED ORDER — CEFAZOLIN SODIUM-DEXTROSE 2-4 GM/100ML-% IV SOLN
2.0000 g | INTRAVENOUS | Status: AC
  Administered 2023-12-12: 2 g via INTRAVENOUS
  Filled 2023-12-12: qty 100

## 2023-12-12 MED ORDER — LIDOCAINE-EPINEPHRINE (PF) 1 %-1:200000 IJ SOLN
INTRAMUSCULAR | Status: AC
  Filled 2023-12-12: qty 30

## 2023-12-12 MED ORDER — FENTANYL CITRATE (PF) 100 MCG/2ML IJ SOLN: 25.0000 ug | INTRAMUSCULAR | Status: DC | PRN

## 2023-12-12 MED ORDER — METOPROLOL TARTRATE 50 MG PO TABS
50.0000 mg | ORAL_TABLET | Freq: Once | ORAL | Status: AC
  Administered 2023-12-12: 50 mg via ORAL
  Filled 2023-12-12: qty 1

## 2023-12-12 MED ORDER — ONDANSETRON HCL 4 MG/2ML IJ SOLN
INTRAMUSCULAR | Status: DC | PRN
  Administered 2023-12-12: 4 mg via INTRAVENOUS

## 2023-12-12 MED ORDER — EPHEDRINE 5 MG/ML INJ
INTRAVENOUS | Status: AC
Start: 2023-12-12 — End: 2023-12-12
  Filled 2023-12-12: qty 5

## 2023-12-12 MED ORDER — INSULIN ASPART 100 UNIT/ML IJ SOLN
0.0000 [IU] | INTRAMUSCULAR | Status: DC | PRN
  Administered 2023-12-12: 2 [IU] via SUBCUTANEOUS
  Filled 2023-12-12: qty 1

## 2023-12-12 MED ORDER — LIDOCAINE-EPINEPHRINE (PF) 1 %-1:200000 IJ SOLN
INTRAMUSCULAR | Status: DC | PRN
  Administered 2023-12-12: 5.5 mL

## 2023-12-12 MED ORDER — VASOPRESSIN 20 UNIT/ML IV SOLN
INTRAVENOUS | Status: AC
  Filled 2023-12-12: qty 1

## 2023-12-12 MED ORDER — ONDANSETRON HCL 4 MG/2ML IJ SOLN
INTRAMUSCULAR | Status: AC
  Filled 2023-12-12: qty 2

## 2023-12-12 MED ORDER — BUPIVACAINE HCL (PF) 0.25 % IJ SOLN
INTRAMUSCULAR | Status: DC | PRN
Start: 2023-12-12 — End: 2023-12-12
  Administered 2023-12-12: 10 mL

## 2023-12-12 MED ORDER — EPHEDRINE SULFATE-NACL 50-0.9 MG/10ML-% IV SOSY
PREFILLED_SYRINGE | INTRAVENOUS | Status: DC | PRN
  Administered 2023-12-12 (×2): 5 mg via INTRAVENOUS

## 2023-12-12 MED ORDER — PROPOFOL 10 MG/ML IV BOLUS
INTRAVENOUS | Status: AC
Start: 2023-12-12 — End: 2023-12-12
  Filled 2023-12-12: qty 20

## 2023-12-12 MED ORDER — FENTANYL CITRATE (PF) 250 MCG/5ML IJ SOLN
INTRAMUSCULAR | Status: AC
  Filled 2023-12-12: qty 5

## 2023-12-12 MED ORDER — IPRATROPIUM-ALBUTEROL 0.5-2.5 (3) MG/3ML IN SOLN
RESPIRATORY_TRACT | Status: AC
Start: 2023-12-12 — End: 2023-12-12
  Filled 2023-12-12: qty 3

## 2023-12-12 MED ORDER — IPRATROPIUM-ALBUTEROL 0.5-2.5 (3) MG/3ML IN SOLN
3.0000 mL | Freq: Once | RESPIRATORY_TRACT | Status: AC
  Administered 2023-12-12: 3 mL via RESPIRATORY_TRACT

## 2023-12-12 MED ORDER — MEPIVACAINE HCL (PF) 2 % IJ SOLN
INTRAMUSCULAR | Status: DC | PRN
  Administered 2023-12-12: 5 mL

## 2023-12-12 MED ORDER — 0.9 % SODIUM CHLORIDE (POUR BTL) OPTIME
TOPICAL | Status: DC | PRN
  Administered 2023-12-12: 1000 mL

## 2023-12-12 MED ORDER — OXYCODONE HCL 5 MG PO TABS: 5.0000 mg | ORAL_TABLET | Freq: Once | ORAL | Status: DC | PRN

## 2023-12-12 MED ORDER — OXYCODONE-ACETAMINOPHEN 5-325 MG PO TABS
1.0000 | ORAL_TABLET | Freq: Four times a day (QID) | ORAL | 0 refills | Status: AC | PRN
  Filled 2023-12-12: qty 8, 2d supply, fill #0

## 2023-12-12 MED ORDER — VASOPRESSIN 20 UNIT/ML IV SOLN
INTRAVENOUS | Status: DC | PRN
  Administered 2023-12-12: 2 [IU] via INTRAVENOUS
  Administered 2023-12-12: 1 [IU] via INTRAVENOUS
  Administered 2023-12-12: 3 [IU] via INTRAVENOUS

## 2023-12-12 MED ORDER — SODIUM CHLORIDE 0.9% FLUSH: 3.0000 mL | INTRAVENOUS | Status: DC | PRN

## 2023-12-12 MED ORDER — HEPARIN 6000 UNIT IRRIGATION SOLUTION
Status: DC | PRN
  Administered 2023-12-12: 1

## 2023-12-12 MED ORDER — CHLORHEXIDINE GLUCONATE 0.12 % MT SOLN
15.0000 mL | Freq: Once | OROMUCOSAL | Status: AC
  Administered 2023-12-12: 15 mL via OROMUCOSAL
  Filled 2023-12-12: qty 15

## 2023-12-12 MED ORDER — ROPIVACAINE HCL 5 MG/ML IJ SOLN
INTRAMUSCULAR | Status: DC | PRN
Start: 2023-12-12 — End: 2023-12-12
  Administered 2023-12-12: 25 mL via PERINEURAL

## 2023-12-12 MED ORDER — PROPOFOL 500 MG/50ML IV EMUL
INTRAVENOUS | Status: DC | PRN
  Administered 2023-12-12: 50 ug/kg/min via INTRAVENOUS

## 2023-12-12 MED ORDER — ACETAMINOPHEN 500 MG PO TABS
1000.0000 mg | ORAL_TABLET | Freq: Once | ORAL | Status: AC
  Administered 2023-12-12: 1000 mg via ORAL
  Filled 2023-12-12: qty 2

## 2023-12-12 MED ORDER — ORAL CARE MOUTH RINSE: 15.0000 mL | Freq: Once | OROMUCOSAL | Status: AC

## 2023-12-12 MED ORDER — ROCURONIUM BROMIDE 10 MG/ML (PF) SYRINGE
PREFILLED_SYRINGE | INTRAVENOUS | Status: AC
  Filled 2023-12-12: qty 10

## 2023-12-12 MED ORDER — HEPARIN 6000 UNIT IRRIGATION SOLUTION
Status: AC
  Filled 2023-12-12: qty 500

## 2023-12-12 MED ORDER — PROPOFOL 10 MG/ML IV BOLUS
INTRAVENOUS | Status: AC
  Filled 2023-12-12: qty 20

## 2023-12-12 MED ORDER — GLYCOPYRROLATE 0.2 MG/ML IJ SOLN
INTRAMUSCULAR | Status: DC | PRN
  Administered 2023-12-12 (×2): .1 mg via INTRAVENOUS

## 2023-12-12 MED ORDER — PHENYLEPHRINE HCL-NACL 20-0.9 MG/250ML-% IV SOLN
INTRAVENOUS | Status: DC | PRN
  Administered 2023-12-12: 20 ug/min via INTRAVENOUS

## 2023-12-12 SURGICAL SUPPLY — 23 items
BAG COUNTER SPONGE SURGICOUNT (BAG) ×1 IMPLANT
CANISTER SUCTION 3000ML PPV (SUCTIONS) ×1 IMPLANT
CLIP TI MEDIUM 6 (CLIP) ×1 IMPLANT
CLIP TI WIDE RED SMALL 6 (CLIP) ×1 IMPLANT
DERMABOND ADVANCED .7 DNX12 (GAUZE/BANDAGES/DRESSINGS) ×1 IMPLANT
ELECTRODE REM PT RTRN 9FT ADLT (ELECTROSURGICAL) ×1 IMPLANT
GLOVE SURG SS PI 7.5 STRL IVOR (GLOVE) ×3 IMPLANT
GOWN STRL REUS W/ TWL LRG LVL3 (GOWN DISPOSABLE) ×2 IMPLANT
GOWN STRL REUS W/ TWL XL LVL3 (GOWN DISPOSABLE) ×1 IMPLANT
HEMOSTAT SNOW SURGICEL 2X4 (HEMOSTASIS) IMPLANT
KIT BASIN OR (CUSTOM PROCEDURE TRAY) ×1 IMPLANT
KIT TURNOVER KIT B (KITS) ×1 IMPLANT
NS IRRIG 1000ML POUR BTL (IV SOLUTION) ×1 IMPLANT
PACK CV ACCESS (CUSTOM PROCEDURE TRAY) ×1 IMPLANT
PAD ARMBOARD POSITIONER FOAM (MISCELLANEOUS) ×2 IMPLANT
SUT ETHILON 3 0 PS 1 (SUTURE) IMPLANT
SUT PROLENE 6 0 BV (SUTURE) IMPLANT
SUT SILK 0 TIES 10X30 (SUTURE) ×1 IMPLANT
SUT VIC AB 3-0 SH 27X BRD (SUTURE) ×1 IMPLANT
SUT VIC AB 4-0 PS2 18 (SUTURE) IMPLANT
TOWEL GREEN STERILE (TOWEL DISPOSABLE) ×1 IMPLANT
UNDERPAD 30X36 HEAVY ABSORB (UNDERPADS AND DIAPERS) ×1 IMPLANT
WATER STERILE IRR 1000ML POUR (IV SOLUTION) ×1 IMPLANT

## 2023-12-12 NOTE — Transfer of Care (Signed)
 Immediate Anesthesia Transfer of Care Note  Patient: Victor Hernandez  Procedure(s) Performed: LIGATION OF ARTERIOVENOUS  FISTULA (Left)  Patient Location: PACU  Anesthesia Type:MAC combined with regional for post-op pain  Level of Consciousness: awake and alert   Airway & Oxygen Therapy: Patient Spontanous Breathing and Patient connected to face mask oxygen  Post-op Assessment: Report given to RN and Post -op Vital signs reviewed and stable  Post vital signs: Reviewed and stable  Last Vitals:  Vitals Value Taken Time  BP 125/77 12/12/23 09:34  Temp    Pulse 49 12/12/23 09:37  Resp 20 12/12/23 09:38  SpO2 99 % 12/12/23 09:37  Vitals shown include unfiled device data.  Last Pain:  Vitals:   12/12/23 0729  TempSrc:   PainSc: 0-No pain         Complications: No notable events documented.

## 2023-12-12 NOTE — H&P (Signed)
   Patient name: Victor Hernandez MRN: [id number] DOB: [birth year] Sex: male    HISTORY OF PRESENT ILLNESS:   Victor Hernandez is a 68 y.o. male with non-maturing left arm fistula with a competing branch  CURRENT MEDICATIONS:    Current Facility-Administered Medications  Medication Dose Route Frequency Provider Last Rate Last Admin   0.9 %  sodium chloride  infusion   Intravenous Continuous Peggye Delon Brunswick, MD       ceFAZolin  (ANCEF ) IVPB 2g/100 mL premix  2 g Intravenous 30 min Pre-Op  Serene Gaile LELON, MD       chlorhexidine  (HIBICLENS ) 4 % liquid 4 Application  60 mL Topical Once Christe Tellez W, MD       And   [START ON 12/13/2023] chlorhexidine  (HIBICLENS ) 4 % liquid 4 Application  60 mL Topical Once Chesnee Floren W, MD       insulin  aspart (novoLOG ) injection 0-7 Units  0-7 Units Subcutaneous Q2H PRN Peggye Delon Brunswick, MD   2 Units at 12/12/23 0730   sodium chloride  flush (NS) 0.9 % injection 3-10 mL  3-10 mL Intravenous PRN Serene Gaile LELON, MD        REVIEW OF SYSTEMS:   [X]  denotes positive finding, [ ]  denotes negative finding Cardiac  Comments:  Chest pain or chest pressure:    Shortness of breath upon exertion:    Short of breath when lying flat:    Irregular heart rhythm:    Constitutional    Fever or chills:      PHYSICAL EXAM:   Vitals:   12/12/23 0701  BP: (!) 163/101  Pulse: 65  Resp: 16  Temp: 97.9 F (36.6 C)  TempSrc: Oral  SpO2: 94%  Weight: 77.3 kg  Height: 5' 11 (1.803 m)    GENERAL: The patient is a well-nourished male, in no acute distress. The vital signs are documented above. CARDIOVASCULAR: There is a regular rate and rhythm. PULMONARY: Non-labored respirations Palpable thrill in AVF  STUDIES:      MEDICAL ISSUES:   ESRD:  discussed proceeding with branch ligation.  All questions answered  Malvina Serene CLORE, MD, FACS [facility] [phone] Pager [phone]

## 2023-12-12 NOTE — Discharge Instructions (Signed)
   [facility]  Discharge Instructions  AV Fistula or Graft Surgery for Dialysis Access  Please refer to the following instructions for your post-procedure care. Your surgeon or physician assistant will discuss any changes with you.  Activity  You may drive the day following your surgery, if you are comfortable and no longer taking prescription pain medication. Resume full activity as the soreness in your incision resolves.  Bathing/Showering  You may shower after you go home. Keep your incision dry for 48 hours. Do not soak in a bathtub, hot tub, or swim until the incision heals completely. You may not shower if you have a hemodialysis catheter.  Incision Care  Clean your incision with mild soap and water after 48 hours. Pat the area dry with a clean towel. You do not need a bandage unless otherwise instructed. Do not apply any ointments or creams to your incision. You may have skin glue on your incision. Do not peel it off. It will come off on its own in about one week. Your arm may swell a bit after surgery. To reduce swelling use pillows to elevate your arm so it is above your heart. Your doctor will tell you if you need to lightly wrap your arm with an ACE bandage.  Diet  Resume your normal diet. There are not special food restrictions following this procedure. In order to heal from your surgery, it is CRITICAL to get adequate nutrition. Your body requires vitamins, minerals, and protein. Vegetables are the best source of vitamins and minerals. Vegetables also provide the perfect balance of protein. Processed food has little nutritional value, so try to avoid this.  Medications  Resume taking all of your medications. If your incision is causing pain, you may take over-the counter pain relievers such as acetaminophen  (Tylenol ). If you were prescribed a stronger pain medication, please be aware these medications can cause nausea and constipation. Prevent  nausea by taking the medication with a snack or meal. Avoid constipation by drinking plenty of fluids and eating foods with high amount of fiber, such as fruits, vegetables, and grains.  Do not take Tylenol  if you are taking prescription pain medications.  Follow up Your surgeon may want to see you in the office following your access surgery. If so, this will be arranged at the time of your surgery.  Please call us  immediately for any of the following conditions:  Increased pain, redness, drainage (pus) from your incision site Fever of 101 degrees or higher Severe or worsening pain at your incision site Hand pain or numbness.  Reduce your risk of vascular disease:  Stop smoking. If you would like help, call [facility] at 1-800-QUIT-NOW (906-354-3106) or [place] at (740)136-0742  Manage your cholesterol Maintain a desired weight Control your diabetes Keep your blood pressure down  Dialysis  It will take several weeks to several months for your new dialysis access to be ready for use. Your surgeon will determine when it is okay to use it. Your nephrologist will continue to direct your dialysis. You can continue to use your Permcath until your new access is ready for use.   12/12/2023 Victor Hernandez [id number] 1955/07/11  Surgeon(s): Serene Gaile LELON, MD  Procedure(s): Ligation competing branch of AV fistula  x Do not stick fistula for 6 weeks.    If you have any questions, please call the office at [phone].

## 2023-12-12 NOTE — Anesthesia Procedure Notes (Signed)
 Anesthesia Regional Block: Supraclavicular block   Pre-Anesthetic Checklist: , timeout performed,  Correct Patient, Correct Site, Correct Laterality,  Correct Procedure, Correct Position, site marked,  Risks and benefits discussed,  Surgical consent,  Pre-op  evaluation,  At surgeon's request and post-op pain management  Laterality: Left  Prep: chloraprep       Needles:  Injection technique: Single-shot  Needle Type: Echogenic Stimulator Needle     Needle Length: 9cm  Needle Gauge: 21     Additional Needles:   Procedures:,,,, ultrasound used (permanent image in chart),,    Narrative:  Start time: 12/12/2023 8:24 AM End time: 12/12/2023 8:29 AM Injection made incrementally with aspirations every 5 mL.  Performed by: Personally  Anesthesiologist: Peggye Delon Brunswick, MD  Additional Notes: Patient with pre-existing numbness in left forearm.  Axillary ring performed for intercostobrachial nerve coverage.  Discussed risks and benefits of nerve block including, but not limited to, prolonged and/or permanent nerve injury involving sensory and/or motor function. Monitors were applied and a time-out was performed. The nerve and associated structures were visualized under ultrasound guidance. After negative aspiration, local anesthetic was slowly injected around the nerve. There was no evidence of high pressure during the procedure. There were no paresthesias. VSS remained stable and the patient tolerated the procedure well.

## 2023-12-12 NOTE — Op Note (Signed)
    Patient name: Victor Hernandez MRN: [id number] DOB: 12/17/1955 Sex: male  12/12/2023 Pre-operative Diagnosis: ESRD Post-operative diagnosis:  Same Surgeon:  Malvina New Assistants:  S. Rhyne, PA Procedure:   Revision of left brachiocephalic fistula via branch ligation Anesthesia:  MAC Blood Loss:  minimal Specimens:  none  Findings: 1 large competing branch was ligated in the upper arm  Indications: This is a 68 year old gentleman with end-stage renal disease who is status post left brachiocephalic fistula that has not matured.  Fistulogram revealed a competing branch as the etiology for the nonmaturation.  He comes in today for branch ligation.  Procedure:  The patient was identified in the holding area and taken to [facility] OR ROOM 16  The patient was then placed supine on the table. MAC anesthesia was administered.  The patient was prepped and draped in the usual sterile fashion.  A time out was called and antibiotics were administered.  A PA helped with the procedure by providing suction and retraction for exposure.  Ultrasound was used to look at the cephalic vein from the anastomosis up to the shoulder.  There was 1 large competing branch that was easily identified.  1% lidocaine  was used for local anesthesia.  I made a longitudinal incision anterior to the fistula.  Cautery was used to divide the tissue down to the cephalic vein.  The large branch was identified and fully exposed.  It was ligated with two 2-0 silk ties.  There was a improvement in the thrill within the fistula upon ligation.  The wound was then irrigated.  Hemostasis was achieved.  The incision was closed with 2 layers of Vicryl followed by Dermabond.  There were no immediate complications.   Disposition: To PACU stable.  Fistula will be ready for use in 1 month   V. Malvina New, M.D., [facility] [place] Office: [phone] Pager:  [phone]

## 2023-12-12 NOTE — Anesthesia Postprocedure Evaluation (Signed)
 Anesthesia Post Note  Patient: Victor Hernandez  Procedure(s) Performed: LIGATION OF ARTERIOVENOUS  FISTULA (Left)     Patient location during evaluation: PACU Anesthesia Type: Regional Level of consciousness: awake Pain management: pain level controlled Vital Signs Assessment: post-procedure vital signs reviewed and stable Respiratory status: spontaneous breathing, nonlabored ventilation and respiratory function stable Cardiovascular status: stable and blood pressure returned to baseline Postop Assessment: no apparent nausea or vomiting Anesthetic complications: no Comments: Patient with sinus bradycardia on monitor with HR in the mid-upper 40s. BP stable and patient asymptomatic. Appearance similar to previous ECG from 11/23/2023. Patient also takes a beta blocker.   No notable events documented.  Last Vitals:  Vitals:   12/12/23 1000 12/12/23 1015  BP: 103/67 104/70  Pulse: (!) 46 (!) 45  Resp: 20 19  Temp:  (!) 36.4 C  SpO2: 96% 98%    Last Pain:  Vitals:   12/12/23 1000  TempSrc:   PainSc: 0-No pain                 [name]

## 2023-12-13 ENCOUNTER — Encounter (HOSPITAL_COMMUNITY): Payer: Self-pay | Admitting: Surgery

## 2023-12-27 ENCOUNTER — Other Ambulatory Visit: Payer: Self-pay | Admitting: *Deleted

## 2023-12-27 DIAGNOSIS — N186 End stage renal disease: Secondary | ICD-10-CM

## 2024-01-21 ENCOUNTER — Ambulatory Visit: Attending: Surgery | Admitting: Physician Assistant

## 2024-01-21 ENCOUNTER — Ambulatory Visit (HOSPITAL_COMMUNITY)
Admission: RE | Admit: 2024-01-21 | Discharge: 2024-01-21 | Disposition: A | Discharge status: Home / Self Care | Source: Ambulatory Visit | Attending: Surgery | Admitting: Surgery

## 2024-01-21 VITALS — BP 142/85 | HR 57 | Temp 97.7°F | Wt 177.4 lb

## 2024-01-21 DIAGNOSIS — N186 End stage renal disease: Secondary | ICD-10-CM | POA: Insufficient documentation

## 2024-01-21 NOTE — Progress Notes (Signed)
 Postoperative Access Visit   History of Present Illness   Victor Hernandez is a 68 y.o. year old male who presents for postoperative follow-up for: ligation of competing branches of left AV fistula by Dr. Serene on 12/12/23.  The patient's wounds are well healed.  The patient notes no steal symptoms.  The patient is able to complete their activities of daily living. He currently dialyzes via right internal jugular TDC at the [facility] location on TTS.   Physical Examination   Vitals:   01/21/24 1407  BP: (!) 142/85  Pulse: (!) 57  Temp: 97.7 F (36.5 C)  TempSrc: Temporal  Weight: 177 lb 6.4 oz (80.5 kg)   Body mass index is 24.74 kg/m.  left arm Incision is well healed, 2+ radial pulse, hand grip is 5/5, sensation in digits is intact, palpable thrill, bruit can be auscultated     Non invasive vascular lab:   Findings:  +--------------------+----------+-----------------+--------+  AVF                PSV (cm/s)Flow Vol (mL/min)Comments  +--------------------+----------+-----------------+--------+  Native artery inflow   117           795                 +--------------------+----------+-----------------+--------+  AVF Anastomosis        231                               +--------------------+----------+-----------------+--------+     +------------+----------+-------------+----------+-------------------------  --+  OUTFLOW VEINPSV (cm/s)Diameter (cm)Depth (cm)         Describe             +------------+----------+-------------+----------+-------------------------  --+  Shoulder       78        0.57        0.44                                 +------------+----------+-------------+----------+-------------------------  --+  Prox UA         73        0.68        0.29                                 +------------+----------+-------------+----------+-------------------------  --+  Mid UA         600        0.57        0.35    stenosis vs. retained  valve  +------------+----------+-------------+----------+-------------------------  --+  Dist UA        957        0.20        0.63            stenotic             +------------+----------+-------------+----------+-------------------------  --+  AC Fossa       211        0.29        0.50                                 +------------+----------+-------------+----------+-------------------------  --+    Summary:  Patent arteriovenous fistula with stenosis noted in the outflow vein at the distal upper arm.  Flow volume 795 mL/min.   Medical Decision Making   Victor Hernandez is a 68 y.o. year old male who presents s/p ligation of competing branch of left AV fistula by Dr. Serene on 12/12/23.  The patient's wounds are well healed.  The patient notes no steal symptoms.  Duplex today shows still some stenosis in the distal UA as well as slow maturation of the fistula. The volume flow is enough for use of the fistula but is not as high as we would like. Clinically the fistula has great thrill and is easily palpable. At dialysis they have started using this past week and he reports no issues. At this time I would recommend continued use of the fistula. If any issues with cannulation or use of the fistula arise he will need a fistulogram. He does have some cephalic vein sclerosis noted on prior fistulogram. The patient's tunneled dialysis catheter can be removed when Nephrology is comfortable with the performance of the left AV fistula The patient may follow up on a prn basis   Teretha Damme, PA-C [facility] [place] Office: [phone]  Clinic MD: Serene

## 2024-03-31 ENCOUNTER — Ambulatory Visit (HOSPITAL_COMMUNITY)
Admission: RE | Disposition: A | Discharge status: Home / Self Care | Payer: Self-pay | Source: Home / Self Care | Attending: Vascular Surgery

## 2024-03-31 ENCOUNTER — Ambulatory Visit (HOSPITAL_COMMUNITY)
Admission: RE | Admit: 2024-03-31 | Discharge: 2024-03-31 | Disposition: A | Discharge status: Home / Self Care | Attending: Vascular Surgery | Admitting: Vascular Surgery

## 2024-03-31 ENCOUNTER — Other Ambulatory Visit: Payer: Self-pay

## 2024-03-31 DIAGNOSIS — I5022 Chronic systolic (congestive) heart failure: Secondary | ICD-10-CM | POA: Insufficient documentation

## 2024-03-31 DIAGNOSIS — Z992 Dependence on renal dialysis: Secondary | ICD-10-CM | POA: Insufficient documentation

## 2024-03-31 DIAGNOSIS — Z87891 Personal history of nicotine dependence: Secondary | ICD-10-CM | POA: Diagnosis not present

## 2024-03-31 DIAGNOSIS — E1122 Type 2 diabetes mellitus with diabetic chronic kidney disease: Secondary | ICD-10-CM | POA: Diagnosis not present

## 2024-03-31 DIAGNOSIS — I132 Hypertensive heart and chronic kidney disease with heart failure and with stage 5 chronic kidney disease, or end stage renal disease: Secondary | ICD-10-CM | POA: Diagnosis not present

## 2024-03-31 DIAGNOSIS — Y832 Surgical operation with anastomosis, bypass or graft as the cause of abnormal reaction of the patient, or of later complication, without mention of misadventure at the time of the procedure: Secondary | ICD-10-CM | POA: Insufficient documentation

## 2024-03-31 DIAGNOSIS — N186 End stage renal disease: Secondary | ICD-10-CM | POA: Diagnosis not present

## 2024-03-31 DIAGNOSIS — T82858A Stenosis of vascular prosthetic devices, implants and grafts, initial encounter: Secondary | ICD-10-CM | POA: Diagnosis present

## 2024-03-31 HISTORY — PX: VENOUS ANGIOPLASTY: CATH118376

## 2024-03-31 HISTORY — PX: A/V FISTULAGRAM: CATH118298

## 2024-03-31 LAB — GLUCOSE, CAPILLARY: Glucose-Capillary: 137 mg/dL — ABNORMAL HIGH (ref 70–99)

## 2024-03-31 SURGERY — A/V FISTULAGRAM
Anesthesia: LOCAL | Site: Arm Upper | Laterality: Left

## 2024-03-31 MED ORDER — LIDOCAINE HCL (PF) 1 % IJ SOLN
INTRAMUSCULAR | Status: AC
  Filled 2024-03-31: qty 30

## 2024-03-31 MED ORDER — HEPARIN (PORCINE) IN NACL 1000-0.9 UT/500ML-% IV SOLN
INTRAVENOUS | Status: DC | PRN
  Administered 2024-03-31: 500 mL

## 2024-03-31 MED ORDER — LIDOCAINE HCL (PF) 1 % IJ SOLN
INTRAMUSCULAR | Status: AC
Start: 2024-03-31 — End: 2024-03-31
  Filled 2024-03-31: qty 30

## 2024-03-31 MED ORDER — LIDOCAINE HCL (PF) 1 % IJ SOLN
INTRAMUSCULAR | Status: DC | PRN
  Administered 2024-03-31 (×2): 5 mL via INTRADERMAL

## 2024-03-31 MED ORDER — IODIXANOL 320 MG/ML IV SOLN
INTRAVENOUS | Status: DC | PRN
  Administered 2024-03-31: 40 mL

## 2024-03-31 SURGICAL SUPPLY — 9 items
BALLOON MUSTANG 6.0X40 75 (BALLOONS) IMPLANT
DEVICE INFLATION ENCORE 26 (MISCELLANEOUS) IMPLANT
KIT MICROPUNCTURE NIT STIFF (SHEATH) IMPLANT
KIT PV (KITS) ×2 IMPLANT
SHEATH PINNACLE R/O II 6F 4CM (SHEATH) IMPLANT
SHEATH PROBE COVER 6X72 (BAG) IMPLANT
TRAY PV CATH (CUSTOM PROCEDURE TRAY) ×2 IMPLANT
TUBING CIL FLEX 10 FLL-RA (TUBING) IMPLANT
WIRE BENTSON .035X145CM (WIRE) IMPLANT

## 2024-03-31 NOTE — Op Note (Signed)
 DATE OF SERVICE: 03/31/2024  PATIENT:  Victor Hernandez  68 y.o. male  PRE-OPERATIVE DIAGNOSIS:  end-stage renal disease  POST-OPERATIVE DIAGNOSIS:  Same  PROCEDURE:   1) Ultrasound guided left arm AVF access (CPT [phone]) 2) Left arm fistulagram with peripheral angioplasty (CPT [phone]) 3) established outpatient evaluation and management - level 3 (CPT 99213)  SURGEON:  Debby SAILOR. Magda, MD  ASSISTANT: none  ANESTHESIA:   local  ESTIMATED BLOOD LOSS: min  LOCAL MEDICATIONS USED:  LIDOCAINE    COUNTS: confirmed correct  PATIENT DISPOSITION:  PACU   Delay start of Pharmacological VTE agent (>24hrs) due to surgical blood loss or risk of bleeding: no  INDICATION FOR PROCEDURE: Victor Hernandez is a 68 y.o. male with ESRD on HD via TDC + LUE AVF. After careful discussion of risks, benefits, and alternatives the patient was offered fistulagram. The patient understood and wished to proceed.  OPERATIVE FINDINGS:  Left Upper Extremity Central venous: no stenosis; atypical drainage of the cephalic arch into the jugular vein Subclavian vein: no stenosis Axillary vein: no stenosis Fistula: large branch in the mid fistula draining into the brachial vein; fistula courses into the jugular vein via atypical cephalic arch, as above. peripheral stenosis near the anastomosis of 70%. Anastomosis: no stenosis  DESCRIPTION OF PROCEDURE: After identification of the patient in the pre-operative holding area, the patient was transferred to the operating room. The patient was positioned supine on the operating room table.  The left extremity was prepped and draped in standard fashion. A surgical pause was performed confirming correct patient, procedure, and operative location.  The left upper extremity was anesthetized with subcutaneous injection of 1% lidocaine  over the area of planned access. Using ultrasound guidance, the left upper extremity dialysis access was accessed with micropuncture technique.   Fistulogram was performed in stations with the micro sheath.  See above for details.  The decision was made to intervene. I accessed the fistula retrograde. The lesion was crossed with a Donnel wire. Access was upsized to 39F. The lesion was angioplastied with 6x78mm Mustang. Good result was noted on follow up angiogram  All endovascular equipment was removed.  A figure-of-eight stitch was applied to the exit site with good hemostasis.  Sterile bandage was applied.  Upon completion of the case instrument and sharps counts were confirmed correct. The patient was transferred to the PACU in good condition. I was present for all portions of the procedure.  PLAN: OK to use fistula. Once fistula working well at HD, OK to remove catheter. Follow up as needed for catheter related issues.   Debby SAILOR. Magda, MD [facility] Phone Number: [phone] 03/31/2024 9:41 AM

## 2024-03-31 NOTE — H&P (Signed)
 [facility] [place]  ASSESSMENT / PLAN: 68 y.o. male with ESRD on HD via TDC and LUE fistula. Fistula is having some dysfunction at HD. Plan fistulagram today.  CHIEF COMPLAINT: ESRD, fistula malfunction  HISTORY OF PRESENT ILLNESS: Victor Hernandez is a 68 y.o. male with ESRD on HD via TDC and LUE AVF. The fistula is giving some staff difficulty at dialysis appointments. We reviewed the risks/benefits to fistulagram.  Past Medical History:  Diagnosis Date   Anemia of chronic renal failure    Arthritis    CAD (coronary artery disease)    a. CT chest 05/31/2020: extensive CAD; b. MV 06/06/2023: EF 41%, mod sized fixed defect in basal to mid interolat/anterolat and basal inf wall associated with hypokinesis suggestive of prior infarct in LCx territory. No ischemia.   Cardiomegaly    Cerebral hemorrhage, nontraumatic (HCC)    a. brain MRI 06/01/2020: chronic microhemorrhage in the right parietal lobe   Cerebral microvascular disease    Chronic Heart failure with mid-range ejection fraction (HCC)    a.TTE 03/13/2023: EF 45-50%, glob HK, mild LV dil, mod LVH, G3DD, GLS -14.4%, RVSP 74.3, mod-sev MR, mild-mod TR, AoV sclerosis without stenosis; b.TTE 06/06/2023: EF 50%, mod LVH, G2DD, severe LAE, RAE/RVE,  mildly reduced RVSF, RVSP 85, triv AR/PR, sev MR   Depression    DOE (dyspnea on exertion)    ESRD (end stage renal disease) on dialysis (HCC)    Heart murmur    Hyperlipidemia    Hypertension    Insomnia    a. takes melatonin PRN   Long-term use of aspirin  therapy    NSVT (nonsustained ventricular tachycardia) (HCC)    PAF (paroxysmal atrial fibrillation) (HCC)    PONV (postoperative nausea and vomiting)    Severe mitral regurgitation    a. TTE 06/01/2020: mod-severe MR; b. TTE 03/13/2023: mod-severe MR; c. TTE 06/06/2023: severe MR   Severe pulmonary hypertension (HCC)    a. TTE 03/13/2023: RVSP 74.3 mmHg; b. TTE 06/06/2023: RSVP 85 mmHg.   T2DM (type 2  diabetes mellitus) (HCC)     Past Surgical History:  Procedure Laterality Date   A/V SHUNT INTERVENTION N/A 11/14/2023   Procedure: A/V SHUNT INTERVENTION;  Surgeon: Pearline Norman RAMAN, MD;  Location: [facility];  Service: Cardiovascular;  Laterality: N/A;   AV FISTULA PLACEMENT Left 09/19/2023   Procedure: ARTERIOVENOUS (AV) FISTULA CREATION;  Surgeon: Serene Gaile LELON, MD;  Location: [facility] OR;  Service: Vascular;  Laterality: Left;   COLONOSCOPY     COLONOSCOPY WITH PROPOFOL  N/A 05/19/2016   Procedure: COLONOSCOPY WITH PROPOFOL ;  Surgeon: Louanne KANDICE Muse, MD;  Location: [facility] ENDOSCOPY;  Service: Endoscopy;  Laterality: N/A;   DIALYSIS/PERMA CATHETER INSERTION Right 03/19/2023   Procedure: DIALYSIS/PERMA CATHETER INSERTION;  Surgeon: Marea Selinda RAMAN, MD;  Location: [facility]AS[facility];  Service: Cardiovascular;  Laterality: Right;   DIALYSIS/PERMA CATHETER REPAIR Right 11/22/2023   Procedure: DIALYSIS/PERMA CATHETER REPAIR;  Surgeon: Marea Selinda RAMAN, MD;  Location: [facility]VASIVE [facility];  Service: Cardiovascular;  Laterality: Right;   LIGATION OF ARTERIOVENOUS  FISTULA Left 12/12/2023   Procedure: LIGATION OF ARTERIOVENOUS  FISTULA;  Surgeon: Serene Gaile LELON, MD;  Location: [facility] OR;  Service: Vascular;  Laterality: Left;   TEMPORARY DIALYSIS CATHETER N/A 03/13/2023   Procedure: TEMPORARY DIALYSIS CATHETER;  Surgeon: Jama Cordella KANDICE, MD;  Location: [facility] INVASIVE CV LAB;  Service: Cardiovascular;  Laterality: N/A;    Family History  Problem Relation Age of Onset  Heart attack Father    Diabetes Sister     Social History   Socioeconomic History   Marital status: Married    Spouse name: Not on file   Number of children: Not on file   Years of education: Not on file   Highest education level: Not on file  Occupational History   Not on file  Tobacco Use   Smoking status: Former    Current packs/day: 0.00    Types: Cigarettes    Quit date: 75    Years since quitting: 49.8    Smokeless tobacco: Never  Vaping Use   Vaping status: Never Used  Substance and Sexual Activity   Alcohol use: No    Comment: a beer once in awhile   Drug use: No   Sexual activity: Not on file  Other Topics Concern   Not on file  Social History Narrative   Not on file   Social Drivers of Health   Financial Resource Strain: Not on file  Food Insecurity: No Food Insecurity (11/22/2023)   Hunger Vital Sign    Worried About Running Out of Food in the Last Year: Never true    Ran Out of Food in the Last Year: Never true  Transportation Needs: No Transportation Needs (11/22/2023)   PRAPARE - Administrator, Civil Service (Medical): No    Lack of Transportation (Non-Medical): No  Physical Activity: Not on file  Stress: Not on file  Social Connections: Moderately Isolated (11/22/2023)   Social Connection and Isolation Panel    Frequency of Communication with Friends and Family: More than three times a week    Frequency of Social Gatherings with Friends and Family: More than three times a week    Attends Religious Services: Never    Database Administrator or Organizations: No    Attends Banker Meetings: Never    Marital Status: Married  Catering Manager Violence: Not At Risk (11/22/2023)   Humiliation, Afraid, Rape, and Kick questionnaire    Fear of Current or Ex-Partner: No    Emotionally Abused: No    Physically Abused: No    Sexually Abused: No    No Known Allergies  Current Facility-Administered Medications  Medication Dose Route Frequency Provider Last Rate Last Admin   Heparin  (Porcine) in NaCl 1000-0.9 UT/500ML-% SOLN    PRN Magda Debby SAILOR, MD   500 mL at 03/31/24 0906   iodixanol  (VISIPAQUE ) 320 MG/ML injection    PRN Magda Debby SAILOR, MD   40 mL at 03/31/24 0922   lidocaine  (PF) (XYLOCAINE ) 1 % injection    PRN Magda Debby SAILOR, MD   5 mL at 03/31/24 0912    PHYSICAL EXAM Vitals:   03/31/24 0913 03/31/24 0918 03/31/24 0923 03/31/24 0929   BP: 129/89 138/79 134/79 133/73  Pulse: (!) 58 (!) 58 61 (!) 59  Resp:    12  Temp:      TempSrc:      SpO2: 97% 98% 99% 97%   Left arm AVF with thrill TDC in place  PERTINENT LABORATORY AND RADIOLOGIC DATA  Most recent CBC    Latest Ref Rng & Units 12/12/2023    7:16 AM 11/24/2023    8:25 AM 11/23/2023    4:34 AM  CBC  WBC 4.0 - 10.5 K/uL  5.0  6.1   Hemoglobin 13.0 - 17.0 g/dL 85.6  86.1  86.1   Hematocrit 39.0 - 52.0 % 42.0  43.0  41.9  Platelets 150 - 400 K/uL  174  169      Most recent CMP    Latest Ref Rng & Units 12/12/2023    7:16 AM 11/25/2023    5:14 AM 11/24/2023    3:57 AM  CMP  Glucose 70 - 99 mg/dL 809  817  895   BUN 8 - 23 mg/dL 42  42  51   Creatinine 0.61 - 1.24 mg/dL 2.79  2.60  1.34   Sodium 135 - 145 mmol/L 142  136  134   Potassium 3.5 - 5.1 mmol/L 4.8  4.4  4.0   Chloride 98 - 111 mmol/L 106  99  96   CO2 22 - 32 mmol/L  26  23   Calcium  8.9 - 10.3 mg/dL  8.7  8.4   Total Protein 6.5 - 8.1 g/dL  6.0  6.5   Total Bilirubin 0.0 - 1.2 mg/dL  1.2  1.5   Alkaline Phos 38 - 126 U/L  88  87   AST 15 - 41 U/L  23  25   ALT 0 - 44 U/L  24  25     Renal function CrCl cannot be calculated (Patient's most recent lab result is older than the maximum 21 days allowed.).  Hgb A1c MFr Bld (%)  Date Value  11/22/2023 10.7 (H)   Debby SAILOR. Magda, MD FACS [facility] Phone Number: [phone] 03/31/2024 9:37 AM   Total time spent on preparing this encounter including chart review, data review, collecting history, examining the patient, and coordinating care: 30 minutes.   Portions of this report may have been transcribed using voice recognition software.  Every effort has been made to ensure accuracy; however, inadvertent computerized transcription errors may still be present.

## 2024-04-01 ENCOUNTER — Encounter (HOSPITAL_COMMUNITY): Payer: Self-pay | Admitting: Vascular Surgery

## 2024-04-02 MED FILL — Lidocaine HCl Local Preservative Free (PF) Inj 1%: INTRAMUSCULAR | Qty: 30 | Status: AC

## 2024-05-30 ENCOUNTER — Telehealth (HOSPITAL_COMMUNITY): Payer: Self-pay

## 2024-05-30 ENCOUNTER — Other Ambulatory Visit (HOSPITAL_COMMUNITY): Payer: Self-pay | Admitting: Nephrology

## 2024-05-30 DIAGNOSIS — N186 End stage renal disease: Secondary | ICD-10-CM

## 2024-05-30 NOTE — Telephone Encounter (Signed)
LMOM for pt to call back to get scheduled

## 2024-06-02 ENCOUNTER — Telehealth (HOSPITAL_COMMUNITY): Payer: Self-pay

## 2024-06-02 NOTE — Telephone Encounter (Signed)
LMOM for pt to callback for scheduling

## 2024-06-09 ENCOUNTER — Ambulatory Visit (HOSPITAL_COMMUNITY)
Admission: RE | Admit: 2024-06-09 | Discharge: 2024-06-09 | Disposition: A | Source: Ambulatory Visit | Attending: Nephrology | Admitting: Nephrology

## 2024-06-09 DIAGNOSIS — Z992 Dependence on renal dialysis: Secondary | ICD-10-CM

## 2024-06-09 DIAGNOSIS — Z4901 Encounter for fitting and adjustment of extracorporeal dialysis catheter: Secondary | ICD-10-CM | POA: Insufficient documentation

## 2024-06-09 DIAGNOSIS — N186 End stage renal disease: Secondary | ICD-10-CM | POA: Diagnosis not present

## 2024-06-09 HISTORY — PX: IR REMOVAL TUN CV CATH W/O FL: IMG2289

## 2024-06-09 MED ORDER — LIDOCAINE-EPINEPHRINE 1 %-1:100000 IJ SOLN
20.0000 mL | Freq: Once | INTRAMUSCULAR | Status: AC
  Administered 2024-06-09: 10 mL via INTRADERMAL

## 2024-06-09 MED ORDER — LIDOCAINE-EPINEPHRINE 1 %-1:100000 IJ SOLN
INTRAMUSCULAR | Status: AC
  Filled 2024-06-09: qty 20

## 2024-06-09 NOTE — Progress Notes (Addendum)
 Interventional Radiology Procedure Note  PROCEDURE SUMMARY:  Successful removal of tunneled catheter.  No complications.   EBL = trace  Please see full dictation in imaging section of Epic for procedure details.  Electronically Signed: Carlin DELENA Griffon, PA-C 06/09/2024, 11:07 AM
